# Patient Record
Sex: Female | Born: 1970
Health system: Southern US, Community
[De-identification: ages and names within clinical notes are randomized; demographics above are authoritative.]

## PROBLEM LIST (undated history)

## (undated) DIAGNOSIS — M545 Low back pain, unspecified: Secondary | ICD-10-CM

## (undated) DIAGNOSIS — E7800 Pure hypercholesterolemia, unspecified: Secondary | ICD-10-CM

## (undated) DIAGNOSIS — E669 Obesity, unspecified: Secondary | ICD-10-CM

## (undated) DIAGNOSIS — I509 Heart failure, unspecified: Secondary | ICD-10-CM

## (undated) DIAGNOSIS — O009 Unspecified ectopic pregnancy without intrauterine pregnancy: Secondary | ICD-10-CM

## (undated) DIAGNOSIS — E119 Type 2 diabetes mellitus without complications: Secondary | ICD-10-CM

## (undated) DIAGNOSIS — E079 Disorder of thyroid, unspecified: Secondary | ICD-10-CM

## (undated) DIAGNOSIS — I10 Essential (primary) hypertension: Secondary | ICD-10-CM

## (undated) HISTORY — PX: APPENDECTOMY: SHX54

## (undated) HISTORY — PX: CHOLECYSTECTOMY: SHX55

---

## 2008-01-06 ENCOUNTER — Emergency Department (HOSPITAL_BASED_OUTPATIENT_CLINIC_OR_DEPARTMENT_OTHER): Admission: EM | Admit: 2008-01-06 | Discharge: 2008-01-06 | Payer: Self-pay | Admitting: Emergency Medicine

## 2011-09-05 ENCOUNTER — Emergency Department (HOSPITAL_BASED_OUTPATIENT_CLINIC_OR_DEPARTMENT_OTHER)
Admission: EM | Admit: 2011-09-05 | Discharge: 2011-09-05 | Disposition: A | Discharge status: Home / Self Care | Payer: Self-pay | Attending: Emergency Medicine | Admitting: Emergency Medicine

## 2011-09-05 ENCOUNTER — Emergency Department (INDEPENDENT_AMBULATORY_CARE_PROVIDER_SITE_OTHER): Payer: Self-pay

## 2011-09-05 ENCOUNTER — Encounter (HOSPITAL_BASED_OUTPATIENT_CLINIC_OR_DEPARTMENT_OTHER): Payer: Self-pay | Admitting: Emergency Medicine

## 2011-09-05 DIAGNOSIS — I517 Cardiomegaly: Secondary | ICD-10-CM

## 2011-09-05 DIAGNOSIS — Z760 Encounter for issue of repeat prescription: Secondary | ICD-10-CM

## 2011-09-05 DIAGNOSIS — I509 Heart failure, unspecified: Secondary | ICD-10-CM | POA: Insufficient documentation

## 2011-09-05 DIAGNOSIS — R5381 Other malaise: Secondary | ICD-10-CM | POA: Insufficient documentation

## 2011-09-05 DIAGNOSIS — R0789 Other chest pain: Secondary | ICD-10-CM | POA: Insufficient documentation

## 2011-09-05 DIAGNOSIS — Z79899 Other long term (current) drug therapy: Secondary | ICD-10-CM | POA: Insufficient documentation

## 2011-09-05 DIAGNOSIS — R079 Chest pain, unspecified: Secondary | ICD-10-CM

## 2011-09-05 DIAGNOSIS — I10 Essential (primary) hypertension: Secondary | ICD-10-CM | POA: Insufficient documentation

## 2011-09-05 HISTORY — DX: Essential (primary) hypertension: I10

## 2011-09-05 HISTORY — DX: Heart failure, unspecified: I50.9

## 2011-09-05 LAB — CARDIAC PANEL(CRET KIN+CKTOT+MB+TROPI): Total CK: 298 U/L — ABNORMAL HIGH (ref 7–177)

## 2011-09-05 LAB — DIFFERENTIAL
Basophils Absolute: 0 10*3/uL (ref 0.0–0.1)
Eosinophils Relative: 3 % (ref 0–5)
Lymphocytes Relative: 48 % — ABNORMAL HIGH (ref 12–46)
Lymphs Abs: 3.5 10*3/uL (ref 0.7–4.0)
Monocytes Absolute: 0.8 10*3/uL (ref 0.1–1.0)

## 2011-09-05 LAB — CBC
HCT: 35.8 % — ABNORMAL LOW (ref 36.0–46.0)
MCV: 83.8 fL (ref 78.0–100.0)
RDW: 13.6 % (ref 11.5–15.5)
WBC: 7.3 10*3/uL (ref 4.0–10.5)

## 2011-09-05 LAB — URINE MICROSCOPIC-ADD ON

## 2011-09-05 LAB — COMPREHENSIVE METABOLIC PANEL
CO2: 26 mEq/L (ref 19–32)
Calcium: 8.9 mg/dL (ref 8.4–10.5)
Creatinine, Ser: 1 mg/dL (ref 0.50–1.10)
GFR calc Af Amer: 79 mL/min — ABNORMAL LOW (ref >90)
GFR calc non Af Amer: 68 mL/min — ABNORMAL LOW (ref >90)
Glucose, Bld: 151 mg/dL — ABNORMAL HIGH (ref 70–99)

## 2011-09-05 LAB — URINALYSIS, ROUTINE W REFLEX MICROSCOPIC
Nitrite: NEGATIVE
Specific Gravity, Urine: 1.021 (ref 1.005–1.030)
Urobilinogen, UA: 1 mg/dL (ref 0.0–1.0)

## 2011-09-05 LAB — PREGNANCY, URINE: Preg Test, Ur: NEGATIVE

## 2011-09-05 MED ORDER — CARVEDILOL 12.5 MG PO TABS: 12.5000 mg | ORAL_TABLET | Freq: Two times a day (BID) | ORAL | Status: DC

## 2011-09-05 MED ORDER — FUROSEMIDE 20 MG PO TABS
ORAL_TABLET | ORAL | Status: AC
  Administered 2011-09-05: 20 mg via ORAL
  Filled 2011-09-05: qty 1

## 2011-09-05 MED ORDER — AMLODIPINE BESYLATE 10 MG PO TABS: 10.0000 mg | ORAL_TABLET | Freq: Every day | ORAL | Status: DC

## 2011-09-05 MED ORDER — FUROSEMIDE 20 MG PO TABS
20.0000 mg | ORAL_TABLET | Freq: Once | ORAL | Status: AC
  Administered 2011-09-05: 20 mg via ORAL

## 2011-09-05 MED ORDER — LISINOPRIL 10 MG PO TABS
ORAL_TABLET | ORAL | Status: AC
  Administered 2011-09-05: 10 mg via ORAL
  Filled 2011-09-05: qty 1

## 2011-09-05 MED ORDER — ASPIRIN 81 MG PO CHEW
CHEWABLE_TABLET | ORAL | Status: AC
  Administered 2011-09-05: 81 mg via ORAL
  Filled 2011-09-05: qty 1

## 2011-09-05 MED ORDER — LISINOPRIL 10 MG PO TABS
10.0000 mg | ORAL_TABLET | Freq: Once | ORAL | Status: AC
  Administered 2011-09-05: 10 mg via ORAL

## 2011-09-05 MED ORDER — FUROSEMIDE 20 MG PO TABS: 20.0000 mg | ORAL_TABLET | Freq: Once | ORAL | Status: DC

## 2011-09-05 MED ORDER — LISINOPRIL 10 MG PO TABS: 10.0000 mg | ORAL_TABLET | Freq: Every day | ORAL | Status: DC

## 2011-09-05 MED ORDER — AMLODIPINE BESYLATE 5 MG PO TABS
10.0000 mg | ORAL_TABLET | Freq: Once | ORAL | Status: AC
  Administered 2011-09-05: 10 mg via ORAL

## 2011-09-05 MED ORDER — AMLODIPINE BESYLATE 5 MG PO TABS
ORAL_TABLET | ORAL | Status: AC
  Administered 2011-09-05: 10 mg via ORAL
  Filled 2011-09-05: qty 2

## 2011-09-05 MED ORDER — ASPIRIN 81 MG PO CHEW
81.0000 mg | CHEWABLE_TABLET | Freq: Once | ORAL | Status: AC
  Administered 2011-09-05: 81 mg via ORAL

## 2011-09-05 NOTE — ED Notes (Signed)
Attempted IV access x 2 unsuccessful.  Crystal, RRT at bedside.

## 2011-09-05 NOTE — ED Notes (Signed)
Pt not taking medications as prescribed-

## 2011-09-05 NOTE — ED Notes (Signed)
Pt c/o of "feeling fatigued" with midsternal chest pain that began yesterday.denies SOB

## 2011-09-05 NOTE — Discharge Instructions (Signed)
Chest Pain (Nonspecific) Chest pain has many causes. Your pain could be caused by something serious, such as a heart attack or a blood clot in the lungs. It could also be caused by something less serious, such as a chest bruise or a virus. Follow up with your doctor. More lab tests or other studies may be needed to find the cause of your pain. Most of the time, nonspecific chest pain will improve within 2 to 3 days of rest and mild pain medicine. HOME CARE  For chest bruises, you may put ice on the sore area for 15 to 20 minutes, 3 to 4 times a day. Do this only if it makes you or your child feel better.   Put ice in a plastic bag.   Place a towel between the skin and the bag.   Rest for the next 2 to 3 days.   Go back to work if the pain improves.   See your doctor if the pain lasts longer than 1 to 2 weeks.   Only take medicine as told by your doctor.   Quit smoking if you smoke.  GET HELP RIGHT AWAY IF:   There is more pain or pain that spreads to the arm, neck, jaw, back, or belly (abdomen).   You or your child has shortness of breath.   You or your child coughs more than usual or coughs up blood.   You or your child has very bad back or belly pain, feels sick to his or her stomach (nauseous), or throws up (vomits).   You or your child has very bad weakness.   You or your child passes out (faints).   You or your child has a temperature by mouth above 102 F (38.9 C), not controlled by medicine.  Any of these problems may be serious and may be an emergency. Do not wait to see if the problems will go away. Get medical help right away. Call your local emergency services 911 in [place] Do not drive yourself to the hospital. MAKE SURE YOU:   Understand these instructions.   Will watch this condition.   Will get help right away if you or your child is not doing well or gets worse.  Document Released: 10/25/2007 Document Revised: 04/27/2011 Document Reviewed:  10/25/2007 [employer] Patient Information 2012 [employer]. 

## 2011-09-05 NOTE — ED Provider Notes (Signed)
History     CSN: [id number]  Arrival date & time 09/05/11  0911   First MD Initiated Contact with Patient 09/05/11 [phone]      Chief Complaint  Patient presents with  . Chest Pain    (Consider location/radiation/quality/duration/timing/severity/associated sxs/prior treatment) HPI Pt ran out of her normal medications over the weekend. She noticed strange feeling in her throat she thought was indigestion on Sunday. She has had no SOB, cough, CP, or LE edema/pain. States she lost her insurance and has yet to find another MD.  Past Medical History  Diagnosis Date  . CHF (congestive heart failure)   . Hypertension     History reviewed. No pertinent past surgical history.  No family history on file.  History  Substance Use Topics  . Smoking status: Never Smoker   . Smokeless tobacco: Not on file  . Alcohol Use: No    OB History    Grav Para Term Preterm Abortions TAB SAB Ect Mult Living                  Review of Systems  Constitutional: Positive for fatigue. Negative for fever and chills.  HENT: Negative for sore throat, trouble swallowing and neck pain.   Respiratory: Negative for chest tightness and shortness of breath.   Cardiovascular: Negative for chest pain.  Gastrointestinal: Negative for nausea, vomiting and abdominal pain.  Musculoskeletal: Negative for back pain.  Skin: Negative for rash and wound.  Neurological: Negative for weakness, light-headedness, numbness and headaches.    Allergies  Review of patient's allergies indicates no known allergies.  Home Medications   Current Outpatient Rx  Name Route Sig Dispense Refill  . AMLODIPINE BESYLATE 10 MG PO TABS Oral Take 10 mg by mouth 2 (two) times daily.    Marland Kitchen LISINOPRIL 10 MG PO TABS Oral Take 10 mg by mouth daily.    Marland Kitchen AMLODIPINE BESYLATE 10 MG PO TABS Oral Take 1 tablet (10 mg total) by mouth daily. 30 tablet 0  . ASPIRIN 81 MG PO TABS Oral Take 81 mg by mouth daily.    Marland Kitchen CARVEDILOL 12.5 MG PO TABS  Oral Take 12.5 mg by mouth 2 (two) times daily with a meal.    . CARVEDILOL 12.5 MG PO TABS Oral Take 1 tablet (12.5 mg total) by mouth 2 (two) times daily with a meal. 60 tablet 0  . FUROSEMIDE 20 MG PO TABS Oral Take 20 mg by mouth 2 (two) times daily.    . FUROSEMIDE 20 MG PO TABS Oral Take 1 tablet (20 mg total) by mouth once. 30 tablet 0  . LISINOPRIL 10 MG PO TABS Oral Take 1 tablet (10 mg total) by mouth daily. 30 tablet 0    BP 182/99  Pulse 93  Temp(Src) 98.6 F (37 C) (Oral)  Resp 18  Ht 5\' 1"  (1.549 m)  Wt 190 lb (86.183 kg)  BMI 35.90 kg/m2  SpO2 97%  LMP 08/09/2011  Physical Exam  Nursing note and vitals reviewed. Constitutional: She is oriented to person, place, and time. She appears well-developed and well-nourished. No distress.  HENT:  Head: Normocephalic and atraumatic.  Mouth/Throat: Oropharynx is clear and moist.  Eyes: EOM are normal. Pupils are equal, round, and reactive to light.  Neck: Normal range of motion. Neck supple.  Cardiovascular: Normal rate and regular rhythm.   Pulmonary/Chest: Effort normal and breath sounds normal. No respiratory distress. She has no wheezes. She has no rales. She exhibits no tenderness.  Abdominal: Soft. Bowel sounds are normal. There is no tenderness. There is no rebound and no guarding.  Musculoskeletal: Normal range of motion. She exhibits no edema and no tenderness.       No lower ext swelling or pain  Neurological: She is alert and oriented to person, place, and time.  Skin: Skin is warm and dry. No rash noted. No erythema.  Psychiatric: She has a normal mood and affect. Her behavior is normal.    ED Course  Procedures (including critical care time)  Labs Reviewed  CBC - Abnormal; Notable for the following:    HCT 35.8 (*)    All other components within normal limits  DIFFERENTIAL - Abnormal; Notable for the following:    Neutrophils Relative 38 (*)    Lymphocytes Relative 48 (*)    All other components within  normal limits  COMPREHENSIVE METABOLIC PANEL - Abnormal; Notable for the following:    Potassium 3.3 (*)    Glucose, Bld 151 (*)    GFR calc non Af Amer 68 (*)    GFR calc Af Amer 79 (*)    All other components within normal limits  URINALYSIS, ROUTINE W REFLEX MICROSCOPIC - Abnormal; Notable for the following:    APPearance CLOUDY (*)    Protein, ur >300 (*)    Leukocytes, UA SMALL (*)    All other components within normal limits  CARDIAC PANEL(CRET KIN+CKTOT+MB+TROPI) - Abnormal; Notable for the following:    Total CK 298 (*)    All other components within normal limits  PRO B NATRIURETIC PEPTIDE - Abnormal; Notable for the following:    Pro B Natriuretic peptide (BNP) 1033.0 (*)    All other components within normal limits  URINE MICROSCOPIC-ADD ON - Abnormal; Notable for the following:    Squamous Epithelial / LPF MANY (*)    Bacteria, UA MANY (*)    All other components within normal limits  PREGNANCY, URINE  TROPONIN I   Dg Chest 2 View  09/05/2011  *RADIOLOGY REPORT*  Clinical Data: Chest pain.  CHEST - 2 VIEW  Comparison: None  Findings: Mild cardiomegaly.  Lungs are clear.  No effusions.  No acute bony abnormality.  IMPRESSION: Cardiomegaly.  No active disease.  Original Report Authenticated By: Raelyn Number, M.D.     1. Atypical chest pain   2. Medication refill     Date: 09/05/2011  Rate: 99  Rhythm: normal sinus rhythm  QRS Axis: right  Intervals: QT prolonged  ST/T Wave abnormalities: nonspecific T wave changes  Conduction Disutrbances:none  Narrative Interpretation:   Old EKG Reviewed: unchanged     MDM  Pt resting comfortably. EKG abnormal but unchanged from 12/2007. Neg trop x 2. Unlikely her symptoms represent CAD. Encouraged to find new PMD and take meds as prescribed. Return for concerns.         Julianne Rice, MD 09/05/11 [phone]

## 2011-09-05 NOTE — ED Notes (Signed)
Pt medicated and informed of plan of care.  Troponin due at 1300

## 2011-09-05 NOTE — ED Notes (Signed)
MD at bedside. 

## 2013-06-03 ENCOUNTER — Encounter (HOSPITAL_BASED_OUTPATIENT_CLINIC_OR_DEPARTMENT_OTHER): Payer: Self-pay | Admitting: Emergency Medicine

## 2013-06-03 ENCOUNTER — Emergency Department (HOSPITAL_BASED_OUTPATIENT_CLINIC_OR_DEPARTMENT_OTHER)
Admission: EM | Admit: 2013-06-03 | Discharge: 2013-06-03 | Disposition: A | Discharge status: Home / Self Care | Payer: Medicaid Other | Attending: Emergency Medicine | Admitting: Emergency Medicine

## 2013-06-03 DIAGNOSIS — Z79899 Other long term (current) drug therapy: Secondary | ICD-10-CM | POA: Insufficient documentation

## 2013-06-03 DIAGNOSIS — B353 Tinea pedis: Secondary | ICD-10-CM

## 2013-06-03 DIAGNOSIS — I509 Heart failure, unspecified: Secondary | ICD-10-CM | POA: Insufficient documentation

## 2013-06-03 DIAGNOSIS — Z7982 Long term (current) use of aspirin: Secondary | ICD-10-CM | POA: Insufficient documentation

## 2013-06-03 DIAGNOSIS — I10 Essential (primary) hypertension: Secondary | ICD-10-CM | POA: Insufficient documentation

## 2013-06-03 MED ORDER — MICONAZOLE NITRATE 2 % EX CREA: 1.0000 "application " | TOPICAL_CREAM | Freq: Two times a day (BID) | CUTANEOUS | Status: DC

## 2013-06-03 NOTE — Discharge Instructions (Signed)
If cream does not help with hands within 1-2 weeks then start hydrocortisone cream

## 2013-06-03 NOTE — ED Notes (Signed)
Pt amb to room 7 with quick steady gait in nad. Pt reports rash to hands and feet x several days. Denies any pain or itching.

## 2013-06-03 NOTE — ED Provider Notes (Addendum)
CSN: [id number]     Arrival date & time 06/03/13  0907 History   First MD Initiated Contact with Patient 06/03/13 [phone]     Chief Complaint  Patient presents with  . Rash   (Consider location/radiation/quality/duration/timing/severity/associated sxs/prior Treatment) Patient is a 43 y.o. female presenting with rash. The history is provided by the patient.  Rash Location:  Finger and toe Finger rash location: between the fingers and toes bilaterally. 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 Ineffective treatments:  None tried Associated symptoms: no induration and no joint pain     Past Medical History  Diagnosis Date  . CHF (congestive heart failure)   . Hypertension    History reviewed. No pertinent past surgical history. History reviewed. No pertinent family history. History  Substance Use Topics  . Smoking status: Never Smoker   . Smokeless tobacco: Not on file  . Alcohol Use: No   OB History   Grav Para Term Preterm Abortions TAB SAB Ect Mult Living                 Review of Systems  Musculoskeletal: Negative for arthralgias.  Skin: Positive for rash.  All other systems reviewed and are negative.    Allergies  Review of patient's allergies indicates no known allergies.  Home Medications   Current Outpatient Rx  Name  Route  Sig  Dispense  Refill  . levothyroxine (SYNTHROID, LEVOTHROID) 50 MCG tablet   Oral   Take 50 mcg by mouth daily before breakfast.         . metFORMIN (GLUCOPHAGE) 500 MG tablet   Oral   Take by mouth 2 (two) times daily with a meal.         . pravastatin (PRAVACHOL) 10 MG tablet   Oral   Take 10 mg by mouth daily.         Marland Kitchen amLODipine  (NORVASC) 10 MG tablet   Oral   Take 10 mg by mouth 2 (two) times daily.         Marland Kitchen EXPIRED: amLODipine (NORVASC) 10 MG tablet   Oral   Take 1 tablet (10 mg total) by mouth daily.   30 tablet   0   . aspirin 81 MG tablet   Oral   Take 81 mg by mouth daily.         . carvedilol (COREG) 12.5 MG tablet   Oral   Take 12.5 mg by mouth 2 (two) times daily with a meal.         . EXPIRED: carvedilol (COREG) 12.5 MG tablet   Oral   Take 1 tablet (12.5 mg total) by mouth 2 (two) times daily with a meal.   60 tablet   0   . furosemide (LASIX) 20 MG tablet   Oral   Take 20 mg by mouth 2 (two) times daily.         Marland Kitchen EXPIRED: furosemide (LASIX) 20 MG tablet   Oral   Take 1 tablet (20 mg total) by mouth once.   30 tablet   0   . lisinopril (PRINIVIL,ZESTRIL) 10 MG tablet   Oral   Take 10 mg by mouth daily.         Marland Kitchen  EXPIRED: lisinopril (PRINIVIL,ZESTRIL) 10 MG tablet   Oral   Take 1 tablet (10 mg total) by mouth daily.   30 tablet   0    BP 156/83  Pulse 74  Temp(Src) 97.9 F (36.6 C) (Oral)  Resp 18  SpO2 98% Physical Exam  Nursing note and vitals reviewed. Constitutional: She is oriented to person, place, and time. She appears well-developed and well-nourished. No distress.  HENT:  Head: Normocephalic and atraumatic.  Eyes: EOM are normal. Pupils are equal, round, and reactive to light.  Cardiovascular: Normal rate.   Pulmonary/Chest: Effort normal.  Neurological: She is alert and oriented to person, place, and time.  Skin: Skin is warm and dry. Rash noted.  Dry, scaly, peeling rash between the toes and fingers.  Minimal erythema, no induration or fluctuance  Psychiatric: She has a normal mood and affect. Her behavior is normal. Thought content normal.    ED Course  Procedures (including critical care time) Labs Review Labs Reviewed - No data to display Imaging Review No results found.  EKG Interpretation   None       MDM   1. Tinea pedis of  both feet     Patient with rash on hands and feet most consistent with tinea we'll give miconazole.   Blanchie Dessert, MD 06/03/13 [place], MD 06/03/13 [phone]

## 2013-10-16 ENCOUNTER — Emergency Department (HOSPITAL_BASED_OUTPATIENT_CLINIC_OR_DEPARTMENT_OTHER)
Admission: EM | Admit: 2013-10-16 | Discharge: 2013-10-16 | Disposition: A | Discharge status: Other Acute Inpatient Hospital | Payer: Medicaid Other | Attending: Emergency Medicine | Admitting: Emergency Medicine

## 2013-10-16 ENCOUNTER — Encounter (HOSPITAL_BASED_OUTPATIENT_CLINIC_OR_DEPARTMENT_OTHER): Payer: Self-pay | Admitting: Emergency Medicine

## 2013-10-16 ENCOUNTER — Emergency Department (HOSPITAL_BASED_OUTPATIENT_CLINIC_OR_DEPARTMENT_OTHER): Payer: Medicaid Other

## 2013-10-16 DIAGNOSIS — E7800 Pure hypercholesterolemia, unspecified: Secondary | ICD-10-CM | POA: Insufficient documentation

## 2013-10-16 DIAGNOSIS — E669 Obesity, unspecified: Secondary | ICD-10-CM | POA: Insufficient documentation

## 2013-10-16 DIAGNOSIS — I509 Heart failure, unspecified: Secondary | ICD-10-CM | POA: Diagnosis not present

## 2013-10-16 DIAGNOSIS — I10 Essential (primary) hypertension: Secondary | ICD-10-CM | POA: Diagnosis not present

## 2013-10-16 DIAGNOSIS — Z79899 Other long term (current) drug therapy: Secondary | ICD-10-CM | POA: Insufficient documentation

## 2013-10-16 DIAGNOSIS — R0602 Shortness of breath: Secondary | ICD-10-CM | POA: Insufficient documentation

## 2013-10-16 DIAGNOSIS — E119 Type 2 diabetes mellitus without complications: Secondary | ICD-10-CM | POA: Diagnosis not present

## 2013-10-16 DIAGNOSIS — Z7982 Long term (current) use of aspirin: Secondary | ICD-10-CM | POA: Insufficient documentation

## 2013-10-16 DIAGNOSIS — R079 Chest pain, unspecified: Secondary | ICD-10-CM | POA: Diagnosis present

## 2013-10-16 HISTORY — DX: Unspecified ectopic pregnancy without intrauterine pregnancy: O00.90

## 2013-10-16 HISTORY — DX: Pure hypercholesterolemia, unspecified: E78.00

## 2013-10-16 HISTORY — DX: Type 2 diabetes mellitus without complications: E11.9

## 2013-10-16 HISTORY — DX: Obesity, unspecified: E66.9

## 2013-10-16 LAB — BASIC METABOLIC PANEL
BUN: 12 mg/dL (ref 6–23)
CALCIUM: 9.3 mg/dL (ref 8.4–10.5)
CO2: 23 mEq/L (ref 19–32)
CREATININE: 0.9 mg/dL (ref 0.50–1.10)
Chloride: 104 mEq/L (ref 96–112)
GFR calc non Af Amer: 76 mL/min — ABNORMAL LOW (ref >90)
GFR, EST AFRICAN AMERICAN: 88 mL/min — AB (ref >90)
Glucose, Bld: 125 mg/dL — ABNORMAL HIGH (ref 70–99)
Potassium: 3.3 mEq/L — ABNORMAL LOW (ref 3.7–5.3)
Sodium: 141 mEq/L (ref 137–147)

## 2013-10-16 LAB — PRO B NATRIURETIC PEPTIDE: Pro B Natriuretic peptide (BNP): 2786 pg/mL — ABNORMAL HIGH (ref 0–125)

## 2013-10-16 LAB — TROPONIN I: Troponin I: <0.3 ng/mL (ref <0.30)

## 2013-10-16 LAB — CBC WITH DIFFERENTIAL/PLATELET
BASOS PCT: 1 % (ref 0–1)
Basophils Absolute: 0 10*3/uL (ref 0.0–0.1)
EOS ABS: 0.2 10*3/uL (ref 0.0–0.7)
Eosinophils Relative: 2 % (ref 0–5)
HEMATOCRIT: 38.3 % (ref 36.0–46.0)
Hemoglobin: 13.1 g/dL (ref 12.0–15.0)
Lymphocytes Relative: 50 % — ABNORMAL HIGH (ref 12–46)
Lymphs Abs: 4.2 10*3/uL — ABNORMAL HIGH (ref 0.7–4.0)
MCH: 30.7 pg (ref 26.0–34.0)
MCHC: 34.2 g/dL (ref 30.0–36.0)
MCV: 89.7 fL (ref 78.0–100.0)
Monocytes Absolute: 0.8 10*3/uL (ref 0.1–1.0)
Monocytes Relative: 10 % (ref 3–12)
NEUTROS PCT: 38 % — AB (ref 43–77)
Neutro Abs: 3.2 10*3/uL (ref 1.7–7.7)
Platelets: 298 10*3/uL (ref 150–400)
RBC: 4.27 MIL/uL (ref 3.87–5.11)
RDW: 12.9 % (ref 11.5–15.5)
WBC: 8.4 10*3/uL (ref 4.0–10.5)

## 2013-10-16 LAB — D-DIMER, QUANTITATIVE: D-Dimer, Quant: <0.27 ug/mL-FEU (ref 0.00–0.48)

## 2013-10-16 MED ORDER — NITROGLYCERIN 0.4 MG SL SUBL
0.4000 mg | SUBLINGUAL_TABLET | SUBLINGUAL | Status: DC | PRN
  Administered 2013-10-16: 0.4 mg via SUBLINGUAL
  Filled 2013-10-16: qty 1

## 2013-10-16 MED ORDER — AZITHROMYCIN 250 MG PO TABS
500.0000 mg | ORAL_TABLET | Freq: Once | ORAL | Status: AC
  Administered 2013-10-16: 500 mg via ORAL
  Filled 2013-10-16: qty 2

## 2013-10-16 NOTE — ED Notes (Signed)
Call [facility] for hospitalist

## 2013-10-16 NOTE — ED Notes (Signed)
Upper left non-radiating cp since yesterday.  Pt sts her doctor "cancelled all my meds". Has been without meds x 1 1/2 months.

## 2013-10-16 NOTE — ED Provider Notes (Signed)
CSN: [id number]     Arrival date & time 10/16/13  1733 History  This chart was scribed for Neta Ehlers, MD by Roxan Diesel, ED scribe.  This patient was seen in room MH12/MH12 and the patient's care was started at 6:27 PM.   Chief Complaint  Patient presents with  . Chest Pain    Patient is a 43 y.o. female presenting with chest pain. The history is provided by the patient. No language interpreter was used.  Chest Pain Pain location:  L chest Pain radiates to:  L arm (on one occasion) Pain severity now: Mild-to-moderate. Duration:  3 days Timing:  Intermittent (but constant for the past 6 hours) Chronicity:  New Context comment:  Off of medications Relieved by:  Nothing Worsened by:  Nothing tried Associated symptoms: palpitations and shortness of breath   Associated symptoms: no abdominal pain, no back pain, no cough, no diaphoresis, no fatigue, no fever, no headache, no nausea, no numbness, not vomiting and no weakness   Risk factors: diabetes mellitus, high cholesterol and hypertension   Risk factors comment:  CHF   HPI Comments: Linda Phelps is a 43 y.o. female with h/o CHF, DM, HTN, and hypercholesterolemia who presents to the Emergency Department complaining of left-sided chest pain that began 3 days ago.  Pt states that for the past month she has been off of her lisinopril, Norvasc, Coreg, Lasix, metformin and pravastatin.  She was unable to refill her prescriptions because has not been to her PCP since February.  Since then she has only been taking aspirin 1x/day.  For the past 3 days she has experienced left-sided chest pain which was occurring off-and-on but for the past 6 hours has been constant   She describes CP as "noticable" and localizes pain to the left side of her chest.  On one instance it radiated down her left arm.  She rates pain at 3/10 currently, and 4/10 at its worst.  It is not worsened or relieved by anything.  It is not worsened by deep breathing.  She  also reports associated intermittent SOB and blurred vision.  She denies SOB currently but states she did become short of breath while cleaning at work earlier today.  She also has felt some palpitations which she describes as a sensation of missed beats.  She denies nausea, vomiting, diaphoresis, fevers, cough, urinary or bowel symptoms, change in appetite, or unexplained weight loss.  She does note some possible fluctuating worsening leg swelling since she has been off of her Lasix.  She denies recent long travel, immobilization, surgery, or hormone therapy.  She does not smoke.  She denies h/o MI, heart catheterization, or stress test.  She took a 325mg  aspirin this morning.    Cardiologist was at [facility].  She has not been seen there in over year.  She is now seen at the [facility] on [address].   Past Medical History  Diagnosis Date  . CHF (congestive heart failure)   . Hypertension   . Obesity   . Diabetes mellitus without complication   . High cholesterol   . Ectopic pregnancy     Past Surgical History  Procedure Laterality Date  . Cholecystectomy    . Appendectomy      No family history on file.   History  Substance Use Topics  . Smoking status: Never Smoker   . Smokeless tobacco: Not on file  . Alcohol Use: Yes     Comment:  occ    OB History   Grav Para Term Preterm Abortions TAB SAB Ect Mult Living                   Review of Systems  Constitutional: Negative for fever, chills, diaphoresis, activity change, appetite change, fatigue and unexpected weight change.  HENT: Negative for congestion, facial swelling, rhinorrhea and sore throat.   Eyes: Negative for photophobia and discharge.  Respiratory: Positive for shortness of breath. Negative for cough and chest tightness.   Cardiovascular: Positive for chest pain, palpitations and leg swelling (fluctuating).  Gastrointestinal: Negative for nausea, vomiting, abdominal pain and  diarrhea.  Endocrine: Negative for polydipsia and polyuria.  Genitourinary: Negative for dysuria, frequency, difficulty urinating and pelvic pain.  Musculoskeletal: Negative for arthralgias, back pain, neck pain and neck stiffness.  Skin: Negative for color change and wound.  Allergic/Immunologic: Negative for immunocompromised state.  Neurological: Negative for facial asymmetry, weakness, numbness and headaches.  Hematological: Does not bruise/bleed easily.  Psychiatric/Behavioral: Negative for confusion and agitation.      Allergies  Review of patient's allergies indicates no known allergies.  Home Medications   Prior to Admission medications   Medication Sig Start Date End Date Taking? Authorizing Provider  amLODipine (NORVASC) 10 MG tablet Take 10 mg by mouth 2 (two) times daily.    Historical Provider, MD  amLODipine (NORVASC) 10 MG tablet Take 1 tablet (10 mg total) by mouth daily. 09/05/11 09/04/12  Julianne Rice, MD  aspirin 81 MG tablet Take 81 mg by mouth daily.    Historical Provider, MD  carvedilol (COREG) 12.5 MG tablet Take 12.5 mg by mouth 2 (two) times daily with a meal.    Historical Provider, MD  carvedilol (COREG) 12.5 MG tablet Take 1 tablet (12.5 mg total) by mouth 2 (two) times daily with a meal. 09/05/11 09/04/12  Julianne Rice, MD  furosemide (LASIX) 20 MG tablet Take 20 mg by mouth 2 (two) times daily.    Historical Provider, MD  furosemide (LASIX) 20 MG tablet Take 1 tablet (20 mg total) by mouth once. 09/05/11 09/04/12  Julianne Rice, MD  levothyroxine (SYNTHROID, LEVOTHROID) 50 MCG tablet Take 50 mcg by mouth daily before breakfast.    Historical Provider, MD  lisinopril (PRINIVIL,ZESTRIL) 10 MG tablet Take 10 mg by mouth daily.    Historical Provider, MD  lisinopril (PRINIVIL,ZESTRIL) 10 MG tablet Take 1 tablet (10 mg total) by mouth daily. 09/05/11 09/04/12  Julianne Rice, MD  metFORMIN (GLUCOPHAGE) 500 MG tablet Take by mouth 2 (two) times daily with a  meal.    Historical Provider, MD  miconazole (MICOTIN) 2 % cream Apply 1 application topically 2 (two) times daily. To hands and feet 06/03/13   Blanchie Dessert, MD  pravastatin (PRAVACHOL) 10 MG tablet Take 10 mg by mouth daily.    Historical Provider, MD   BP 200/116  Pulse 102  Temp(Src) 99.1 F (37.3 C) (Oral)  Resp 16  Ht 5' (1.524 m)  Wt 190 lb (86.183 kg)  BMI 37.11 kg/m2  SpO2 97%  LMP 10/08/2013  Physical Exam  Nursing note and vitals reviewed. Constitutional: She is oriented to person, place, and time. She appears well-developed and well-nourished. No distress.  HENT:  Head: Normocephalic.  Mouth/Throat: Oropharynx is clear and moist.  Eyes: Pupils are equal, round, and reactive to light.  Neck: Neck supple.  Cardiovascular: Normal rate, regular rhythm and normal heart sounds.   Pulmonary/Chest: Effort normal. No respiratory distress. She has no wheezes.  Mild crackles at bilateral bases  Abdominal: Soft. She exhibits no distension. There is no tenderness. There is no rebound and no guarding.  Musculoskeletal: She exhibits no edema and no tenderness.  Neurological: She is alert and oriented to person, place, and time.  Skin: Skin is warm and dry.  Psychiatric: She has a normal mood and affect.    ED Course  Procedures (including critical care time)  DIAGNOSTIC STUDIES: Oxygen Saturation is 97% on room air, normal by my interpretation.    COORDINATION OF CARE: 6:37 PM-Discussed treatment plan which includes EKG, CXR, and labs with pt at bedside and pt agreed to plan.     Labs Review Labs Reviewed  CBC WITH DIFFERENTIAL - Abnormal; Notable for the following:    Neutrophils Relative % 38 (*)    Lymphocytes Relative 50 (*)    Lymphs Abs 4.2 (*)    All other components within normal limits  BASIC METABOLIC PANEL - Abnormal; Notable for the following:    Potassium 3.3 (*)    Glucose, Bld 125 (*)    GFR calc non Af Amer 76 (*)    GFR calc Af Amer 88 (*)     All other components within normal limits  PRO B NATRIURETIC PEPTIDE - Abnormal; Notable for the following:    Pro B Natriuretic peptide (BNP) 2786.0 (*)    All other components within normal limits  TROPONIN I  D-DIMER, QUANTITATIVE    Imaging Review Dg Chest 2 View  10/16/2013   CLINICAL DATA:  Chest pain.  EXAM: CHEST  2 VIEW  COMPARISON:  Chest x-ray 09/05/2011 .  FINDINGS: Mediastinum and hilar structures are normal. Heart size normal. Pulmonary vascularity is normal. Mild infiltrate right lung base. No pleural effusion or pneumothorax. Surgical clips right upper quadrant. No acute bony abnormality.  IMPRESSION: 1.  Right lower lobe infiltrate consistent with pneumonia.  2.  Cardiomegaly, no pulmonary venous congestion.   Electronically Signed   By: Marcello Moores  Register   On: 10/16/2013 18:57     EKG Interpretation   Date/Time:  Thursday Oct 16 2013 17:40:12 EDT Ventricular Rate:  102 PR Interval:  166 QRS Duration: 108 QT Interval:  336 QTC Calculation: 437 R Axis:   18 Text Interpretation:  Sinus tachycardia Minimal voltage criteria for LVH,  may be normal variant Nonspecific T wave abnormality Abnormal ECG New TWI  I, new TW flattening in aVL Confirmed by DOCHERTY  MD, MEGAN [phone]) on  10/16/2013 6:24:31 PM      MDM   Final diagnoses:  Chest pain  CHF (congestive heart failure)    Pt is a 43 y.o. female with Pmhx as above who presents with 3 days of intermittent L sided sharp CP with radiation to L arm, now constant for about 6 hrs with intermittent assoc SOB. She has been w/o her meds for 1.5 months. On PE, she has low grade temp, tachycardia, HTN. Slight crackles heard at BL bases. Minimal LE edema. EKG w/ new TWI I, new flattening in aVL. CXR w/ RML opacity, but clnical story not c/w pna and nml WBC. BNP elevated c/w lung exam and mild CHF exacerbation. Trop negative. I feel pt needs ACS r/o as well as possible CTA chest to r/o PE. CT is down at this facility. Will transfer  to [facility] for admission to IM. PO azithro given. BP improved in dept w/o intervention.    I personally performed the services described in this documentation, which was scribed in my  presence. The recorded information has been reviewed and is accurate.     Neta Ehlers, MD 10/17/13 [phone]

## 2013-10-24 ENCOUNTER — Ambulatory Visit: Payer: Self-pay

## 2014-03-08 ENCOUNTER — Emergency Department (HOSPITAL_BASED_OUTPATIENT_CLINIC_OR_DEPARTMENT_OTHER): Payer: Medicaid Other

## 2014-03-08 ENCOUNTER — Emergency Department (HOSPITAL_BASED_OUTPATIENT_CLINIC_OR_DEPARTMENT_OTHER)
Admission: EM | Admit: 2014-03-08 | Discharge: 2014-03-08 | Disposition: A | Discharge status: Home / Self Care | Payer: Medicaid Other | Attending: Emergency Medicine | Admitting: Emergency Medicine

## 2014-03-08 ENCOUNTER — Encounter (HOSPITAL_BASED_OUTPATIENT_CLINIC_OR_DEPARTMENT_OTHER): Payer: Self-pay | Admitting: Emergency Medicine

## 2014-03-08 DIAGNOSIS — I509 Heart failure, unspecified: Secondary | ICD-10-CM | POA: Insufficient documentation

## 2014-03-08 DIAGNOSIS — R000 Tachycardia, unspecified: Secondary | ICD-10-CM | POA: Insufficient documentation

## 2014-03-08 DIAGNOSIS — E669 Obesity, unspecified: Secondary | ICD-10-CM | POA: Insufficient documentation

## 2014-03-08 DIAGNOSIS — I10 Essential (primary) hypertension: Secondary | ICD-10-CM | POA: Insufficient documentation

## 2014-03-08 DIAGNOSIS — E119 Type 2 diabetes mellitus without complications: Secondary | ICD-10-CM | POA: Insufficient documentation

## 2014-03-08 DIAGNOSIS — R030 Elevated blood-pressure reading, without diagnosis of hypertension: Secondary | ICD-10-CM

## 2014-03-08 DIAGNOSIS — Z79899 Other long term (current) drug therapy: Secondary | ICD-10-CM | POA: Insufficient documentation

## 2014-03-08 DIAGNOSIS — Z760 Encounter for issue of repeat prescription: Secondary | ICD-10-CM | POA: Insufficient documentation

## 2014-03-08 DIAGNOSIS — F419 Anxiety disorder, unspecified: Secondary | ICD-10-CM | POA: Insufficient documentation

## 2014-03-08 DIAGNOSIS — R51 Headache: Secondary | ICD-10-CM | POA: Insufficient documentation

## 2014-03-08 DIAGNOSIS — Z7982 Long term (current) use of aspirin: Secondary | ICD-10-CM | POA: Insufficient documentation

## 2014-03-08 DIAGNOSIS — E780 Pure hypercholesterolemia: Secondary | ICD-10-CM | POA: Insufficient documentation

## 2014-03-08 DIAGNOSIS — IMO0001 Reserved for inherently not codable concepts without codable children: Secondary | ICD-10-CM

## 2014-03-08 LAB — PRO B NATRIURETIC PEPTIDE: Pro B Natriuretic peptide (BNP): 979.3 pg/mL — ABNORMAL HIGH (ref 0–125)

## 2014-03-08 LAB — BASIC METABOLIC PANEL
Anion gap: 14 (ref 5–15)
BUN: 21 mg/dL (ref 6–23)
CHLORIDE: 105 meq/L (ref 96–112)
CO2: 24 mEq/L (ref 19–32)
CREATININE: 0.9 mg/dL (ref 0.50–1.10)
Calcium: 9.4 mg/dL (ref 8.4–10.5)
GFR calc Af Amer: 88 mL/min — ABNORMAL LOW (ref >90)
GFR, EST NON AFRICAN AMERICAN: 76 mL/min — AB (ref >90)
Glucose, Bld: 94 mg/dL (ref 70–99)
Potassium: 4 mEq/L (ref 3.7–5.3)
Sodium: 143 mEq/L (ref 137–147)

## 2014-03-08 LAB — CBC
HEMATOCRIT: 38.3 % (ref 36.0–46.0)
Hemoglobin: 12.5 g/dL (ref 12.0–15.0)
MCH: 29.6 pg (ref 26.0–34.0)
MCHC: 32.6 g/dL (ref 30.0–36.0)
MCV: 90.5 fL (ref 78.0–100.0)
Platelets: 254 10*3/uL (ref 150–400)
RBC: 4.23 MIL/uL (ref 3.87–5.11)
RDW: 13 % (ref 11.5–15.5)
WBC: 5.6 10*3/uL (ref 4.0–10.5)

## 2014-03-08 LAB — TROPONIN I: Troponin I: <0.3 ng/mL (ref <0.30)

## 2014-03-08 MED ORDER — FUROSEMIDE 20 MG PO TABS
20.0000 mg | ORAL_TABLET | Freq: Once | ORAL | Status: AC
  Administered 2014-03-08: 20 mg via ORAL
  Filled 2014-03-08: qty 1

## 2014-03-08 MED ORDER — LISINOPRIL 20 MG PO TABS: 10.0000 mg | ORAL_TABLET | Freq: Every day | ORAL | Status: DC

## 2014-03-08 MED ORDER — FUROSEMIDE 20 MG PO TABS: 20.0000 mg | ORAL_TABLET | Freq: Two times a day (BID) | ORAL | Status: DC

## 2014-03-08 MED ORDER — PRAVASTATIN SODIUM 10 MG PO TABS: 10.0000 mg | ORAL_TABLET | Freq: Every day | ORAL | Status: DC

## 2014-03-08 MED ORDER — CARVEDILOL 12.5 MG PO TABS: 12.5000 mg | ORAL_TABLET | Freq: Two times a day (BID) | ORAL | Status: DC

## 2014-03-08 MED ORDER — AMLODIPINE BESYLATE 5 MG PO TABS
10.0000 mg | ORAL_TABLET | Freq: Once | ORAL | Status: AC
  Administered 2014-03-08: 10 mg via ORAL
  Filled 2014-03-08: qty 2

## 2014-03-08 MED ORDER — METFORMIN HCL 500 MG PO TABS: 500.0000 mg | ORAL_TABLET | Freq: Two times a day (BID) | ORAL | Status: DC

## 2014-03-08 MED ORDER — CARVEDILOL 12.5 MG PO TABS
12.5000 mg | ORAL_TABLET | Freq: Two times a day (BID) | ORAL | Status: DC
  Filled 2014-03-08: qty 1

## 2014-03-08 MED ORDER — AMLODIPINE BESYLATE 10 MG PO TABS: 10.0000 mg | ORAL_TABLET | Freq: Every day | ORAL | Status: DC

## 2014-03-08 NOTE — ED Provider Notes (Signed)
CSN: [id number]     Arrival date & time 03/08/14  1507 History   First MD Initiated Contact with Patient 03/08/14 1519     Chief Complaint  Patient presents with  . Medication Refill     (Consider location/radiation/quality/duration/timing/severity/associated sxs/prior Treatment) HPI Comments: This is a 43 year old obese female with a past medical history of CHF, hypertension, DM and hyperlipidemia who presents to the emergency department requesting a medication refill. Patient reports she has been out of her daily medications for 2 days, and she called [facility] who would not refill her medications over the phone since it has been a while since she has seen her physician. States her last appointment was a little over 3 months ago. She takes amlodipine, carvedilol, furosemide, levothyroxine, lisinopril, metformin and pravastatin. She reports she can tell her blood pressure is elevated because she has a slight headache. Denies chest pain, shortness of breath, vision change, lightheadedness, dizziness, numbness or weakness. It is noted patient is tachycardic on arrival, when discussing increased heart rate, patient reports she is always anxious when she goes to the emergency department.   The history is provided by the patient.    Past Medical History  Diagnosis Date  . CHF (congestive heart failure)   . Hypertension   . Obesity   . Diabetes mellitus without complication   . High cholesterol   . Ectopic pregnancy    Past Surgical History  Procedure Laterality Date  . Cholecystectomy    . Appendectomy     No family history on file. History  Substance Use Topics  . Smoking status: Never Smoker   . Smokeless tobacco: Not on file  . Alcohol Use: Yes     Comment: occ   OB History   Grav Para Term Preterm Abortions TAB SAB Ect Mult Living                 Review of Systems  Neurological: Positive for headaches.  Psychiatric/Behavioral: The patient is  nervous/anxious.   All other systems reviewed and are negative.     Allergies  Review of patient's allergies indicates no known allergies.  Home Medications   Prior to Admission medications   Medication Sig Start Date End Date Taking? Authorizing Provider  amLODipine (NORVASC) 10 MG tablet Take 10 mg by mouth 2 (two) times daily.    Historical Provider, MD  amLODipine (NORVASC) 10 MG tablet Take 1 tablet (10 mg total) by mouth daily. 09/05/11 09/04/12  Julianne Rice, MD  amLODipine (NORVASC) 10 MG tablet Take 1 tablet (10 mg total) by mouth daily. 03/08/14   Carman Ching, PA-C  aspirin 81 MG tablet Take 81 mg by mouth daily.    Historical Provider, MD  carvedilol (COREG) 12.5 MG tablet Take 12.5 mg by mouth 2 (two) times daily with a meal.    Historical Provider, MD  carvedilol (COREG) 12.5 MG tablet Take 1 tablet (12.5 mg total) by mouth 2 (two) times daily with a meal. 09/05/11 09/04/12  Julianne Rice, MD  carvedilol (COREG) 12.5 MG tablet Take 1 tablet (12.5 mg total) by mouth 2 (two) times daily with a meal. 03/08/14   Loyalty Brashier M Jaziya Obarr, PA-C  furosemide (LASIX) 20 MG tablet Take 10 mg by mouth 2 (two) times daily.     Historical Provider, MD  furosemide (LASIX) 20 MG tablet Take 1 tablet (20 mg total) by mouth once. 09/05/11 09/04/12  Julianne Rice, MD  furosemide (LASIX) 20 MG tablet Take 1  tablet (20 mg total) by mouth 2 (two) times daily. 03/08/14   Etheline Geppert M Tamula Morrical, PA-C  levothyroxine (SYNTHROID, LEVOTHROID) 50 MCG tablet Take 50 mcg by mouth daily before breakfast.    Historical Provider, MD  lisinopril (PRINIVIL,ZESTRIL) 10 MG tablet Take 10 mg by mouth daily.    Historical Provider, MD  lisinopril (PRINIVIL,ZESTRIL) 10 MG tablet Take 1 tablet (10 mg total) by mouth daily. 09/05/11 09/04/12  Julianne Rice, MD  lisinopril (PRINIVIL,ZESTRIL) 20 MG tablet Take 0.5 tablets (10 mg total) by mouth daily. 03/08/14   Carman Ching, PA-C  metFORMIN (GLUCOPHAGE) 500 MG tablet Take by mouth 2 (two)  times daily with a meal.    Historical Provider, MD  metFORMIN (GLUCOPHAGE) 500 MG tablet Take 1 tablet (500 mg total) by mouth 2 (two) times daily with a meal. 03/08/14   Kriss Perleberg M Samhita Kretsch, PA-C  miconazole (MICOTIN) 2 % cream Apply 1 application topically 2 (two) times daily. To hands and feet 06/03/13   Blanchie Dessert, MD  pravastatin (PRAVACHOL) 10 MG tablet Take 10 mg by mouth daily.    Historical Provider, MD  pravastatin (PRAVACHOL) 10 MG tablet Take 1 tablet (10 mg total) by mouth daily. 03/08/14   Kemiyah Tarazon M Patrice Matthew, PA-C   BP 184/104  Pulse 114  Temp(Src) 98 F (36.7 C) (Oral)  Resp 20  Ht 5\' 1"  (1.549 m)  Wt 197 lb (89.359 kg)  BMI 37.24 kg/m2  SpO2 99% Physical Exam  Nursing note and vitals reviewed. Constitutional: She is oriented to person, place, and time. She appears well-developed and well-nourished. No distress.  HENT:  Head: Normocephalic and atraumatic.  Mouth/Throat: Oropharynx is clear and moist.  Eyes: Conjunctivae and EOM are normal. Pupils are equal, round, and reactive to light.  Neck: Normal range of motion. Neck supple. No JVD present.  Cardiovascular: Regular rhythm, normal heart sounds and intact distal pulses.   Tachycardic. No extremity edema.  Pulmonary/Chest: Effort normal and breath sounds normal. No respiratory distress.  Abdominal: Soft. Bowel sounds are normal. There is no tenderness.  Musculoskeletal: Normal range of motion. She exhibits no edema.  Neurological: She is alert and oriented to person, place, and time. She has normal strength. No sensory deficit.  Speech fluent, goal oriented. Moves limbs without ataxia. Equal grip strength bilateral.  Skin: Skin is warm and dry. She is not diaphoretic.  Psychiatric: She has a normal mood and affect. Her behavior is normal.    ED Course  Procedures (including critical care time) Labs Review Labs Reviewed  BASIC METABOLIC PANEL - Abnormal; Notable for the following:    GFR calc non Af Amer 76 (*)     GFR calc Af Amer 88 (*)    All other components within normal limits  PRO B NATRIURETIC PEPTIDE - Abnormal; Notable for the following:    Pro B Natriuretic peptide (BNP) 979.3 (*)    All other components within normal limits  CBC  TROPONIN I    Imaging Review Dg Chest 2 View  03/08/2014   CLINICAL DATA:  Patient since is high blood pressure at work today, ran out of blood pressure medication, initial evaluation  EXAM: CHEST  2 VIEW  COMPARISON:  10/17/2013  FINDINGS: Moderate primarily left-sided cardiac enlargement. Vascular pattern normal. Lungs clear. No effusions.  IMPRESSION: Cardiac enlargement.  No acute findings   Electronically Signed   By: Skipper Cliche M.D.   On: 03/08/2014 15:55     EKG Interpretation None  MDM   Final diagnoses:  Elevated blood pressure  Medication refill   Patient presenting for medication refill. It is noted on arrival she is hypertensive and tachycardic. She has been off of her medications for the past 2 days. Mild headache, no chest pain or shortness of breath. After discussion with patient given her increased heart rate and high blood pressure, will obtain a cardiac workup and give her daily blood pressure medications that she did not receive today.  Labs without any acute finding. BNP chronically elevated. CXR showing cardiac enlargement, otherwise no acute findings. EKG without any acute changes. Pt given amlodipine and furosemide in doses she takes at home, BP 183/94 HR remains 109. Pt was up for discharge, however repeat vitals were asked to be reported prior to d/c. No vitals reported to me, and patient left the ED. Patient told nursing staff she was anxious and wanted to get home. Plan was to give metoprolol. Pt given return precautions prior to being up for discharge. All medications prescribed. No s/s of end organ damage. Attempt to call pt to advise her to have these medications filled tonight(which was discussed earlier today), however  unable to contact.   Carman Ching, PA-C 03/08/14 1712

## 2014-03-08 NOTE — Discharge Instructions (Signed)
Take all of your medications daily as prescribed. Follow up with your primary care doctor. This is very important for you to follow up.  Hypertension Hypertension, commonly called high blood pressure, is when the force of blood pumping through your arteries is too strong. Your arteries are the blood vessels that carry blood from your heart throughout your body. A blood pressure reading consists of a higher number over a lower number, such as 110/72. The higher number (systolic) is the pressure inside your arteries when your heart pumps. The lower number (diastolic) is the pressure inside your arteries when your heart relaxes. Ideally you want your blood pressure below 120/80. Hypertension forces your heart to work harder to pump blood. Your arteries may become narrow or stiff. Having hypertension puts you at risk for heart disease, stroke, and other problems.  RISK FACTORS Some risk factors for high blood pressure are controllable. Others are not.  Risk factors you cannot control include:   Race. You may be at higher risk if you are African American.  Age. Risk increases with age.  Gender. Men are at higher risk than women before age 35 years. After age 70, women are at higher risk than men. Risk factors you can control include:  Not getting enough exercise or physical activity.  Being overweight.  Getting too much fat, sugar, calories, or salt in your diet.  Drinking too much alcohol. SIGNS AND SYMPTOMS Hypertension does not usually cause signs or symptoms. Extremely high blood pressure (hypertensive crisis) may cause headache, anxiety, shortness of breath, and nosebleed. DIAGNOSIS  To check if you have hypertension, your health care provider will measure your blood pressure while you are seated, with your arm held at the level of your heart. It should be measured at least twice using the same arm. Certain conditions can cause a difference in blood pressure between your right and left  arms. A blood pressure reading that is higher than normal on one occasion does not mean that you need treatment. If one blood pressure reading is high, ask your health care provider about having it checked again. TREATMENT  Treating high blood pressure includes making lifestyle changes and possibly taking medicine. Living a healthy lifestyle can help lower high blood pressure. You may need to change some of your habits. Lifestyle changes may include:  Following the DASH diet. This diet is high in fruits, vegetables, and whole grains. It is low in salt, red meat, and added sugars.  Getting at least 2 hours of brisk physical activity every week.  Losing weight if necessary.  Not smoking.  Limiting alcoholic beverages.  Learning ways to reduce stress. If lifestyle changes are not enough to get your blood pressure under control, your health care provider may prescribe medicine. You may need to take more than one. Work closely with your health care provider to understand the risks and benefits. HOME CARE INSTRUCTIONS  Have your blood pressure rechecked as directed by your health care provider.   Take medicines only as directed by your health care provider. Follow the directions carefully. Blood pressure medicines must be taken as prescribed. The medicine does not work as well when you skip doses. Skipping doses also puts you at risk for problems.   Do not smoke.   Monitor your blood pressure at home as directed by your health care provider. SEEK MEDICAL CARE IF:   You think you are having a reaction to medicines taken.  You have recurrent headaches or feel dizzy.  You  have swelling in your ankles.  You have trouble with your vision. SEEK IMMEDIATE MEDICAL CARE IF:  You develop a severe headache or confusion.  You have unusual weakness, numbness, or feel faint.  You have severe chest or abdominal pain.  You vomit repeatedly.  You have trouble breathing. MAKE SURE YOU:     Understand these instructions.  Will watch your condition.  Will get help right away if you are not doing well or get worse. Document Released: 05/08/2005 Document Revised: 09/22/2013 Document Reviewed: 02/28/2013 [facility] Patient Information 2015 [place], [place]. This information is not intended to replace advice given to you by your health care provider. Make sure you discuss any questions you have with your health care provider.  DASH Eating Plan DASH stands for "Dietary Approaches to Stop Hypertension." The DASH eating plan is a healthy eating plan that has been shown to reduce high blood pressure (hypertension). Additional health benefits may include reducing the risk of type 2 diabetes mellitus, heart disease, and stroke. The DASH eating plan may also help with weight loss. WHAT DO I NEED TO KNOW ABOUT THE DASH EATING PLAN? For the DASH eating plan, you will follow these general guidelines:  Choose foods with a percent daily value for sodium of less than 5% (as listed on the food label).  Use salt-free seasonings or herbs instead of table salt or sea salt.  Check with your health care provider or pharmacist before using salt substitutes.  Eat lower-sodium products, often labeled as "lower sodium" or "no salt added."  Eat fresh foods.  Eat more vegetables, fruits, and low-fat dairy products.  Choose whole grains. Look for the word "whole" as the first word in the ingredient list.  Choose fish and skinless chicken or [place] more often than red meat. Limit fish, poultry, and meat to 6 oz (170 g) each day.  Limit sweets, desserts, sugars, and sugary drinks.  Choose heart-healthy fats.  Limit cheese to 1 oz (28 g) per day.  Eat more home-cooked food and less restaurant, buffet, and fast food.  Limit fried foods.  Cook foods using methods other than frying.  Limit canned vegetables. If you do use them, rinse them well to decrease the sodium.  When eating at a  restaurant, ask that your food be prepared with less salt, or no salt if possible. WHAT FOODS CAN I EAT? Seek help from a dietitian for individual calorie needs. Grains Whole grain or whole wheat bread. Brown rice. Whole grain or whole wheat pasta. Quinoa, bulgur, and whole grain cereals. Low-sodium cereals. Corn or whole wheat flour tortillas. Whole grain cornbread. Whole grain crackers. Low-sodium crackers. Vegetables Fresh or frozen vegetables (raw, steamed, roasted, or grilled). Low-sodium or reduced-sodium tomato and vegetable juices. Low-sodium or reduced-sodium tomato sauce and paste. Low-sodium or reduced-sodium canned vegetables.  Fruits All fresh, canned (in natural juice), or frozen fruits. Meat and Other Protein Products Ground beef (85% or leaner), grass-fed beef, or beef trimmed of fat. Skinless chicken or [place]. Ground chicken or [place]. Pork trimmed of fat. All fish and seafood. Eggs. Dried beans, peas, or lentils. Unsalted nuts and seeds. Unsalted canned beans. Dairy Low-fat dairy products, such as skim or 1% milk, 2% or reduced-fat cheeses, low-fat ricotta or cottage cheese, or plain low-fat yogurt. Low-sodium or reduced-sodium cheeses. Fats and Oils Tub margarines without trans fats. Light or reduced-fat mayonnaise and salad dressings (reduced sodium). Avocado. Safflower, olive, or canola oils. Natural peanut or almond butter. Other Unsalted popcorn and pretzels. The  items listed above may not be a complete list of recommended foods or beverages. Contact your dietitian for more options. WHAT FOODS ARE NOT RECOMMENDED? Grains White bread. White pasta. White rice. Refined cornbread. Bagels and croissants. Crackers that contain trans fat. Vegetables Creamed or fried vegetables. Vegetables in a cheese sauce. Regular canned vegetables. Regular canned tomato sauce and paste. Regular tomato and vegetable juices. Fruits Dried fruits. Canned fruit in light or heavy syrup. Fruit  juice. Meat and Other Protein Products Fatty cuts of meat. Ribs, chicken wings, bacon, sausage, bologna, salami, chitterlings, fatback, hot dogs, bratwurst, and packaged luncheon meats. Salted nuts and seeds. Canned beans with salt. Dairy Whole or 2% milk, cream, half-and-half, and cream cheese. Whole-fat or sweetened yogurt. Full-fat cheeses or blue cheese. Nondairy creamers and whipped toppings. Processed cheese, cheese spreads, or cheese curds. Condiments Onion and garlic salt, seasoned salt, table salt, and sea salt. Canned and packaged gravies. Worcestershire sauce. Tartar sauce. Barbecue sauce. Teriyaki sauce. Soy sauce, including reduced sodium. Steak sauce. Fish sauce. Oyster sauce. Cocktail sauce. Horseradish. Ketchup and mustard. Meat flavorings and tenderizers. Bouillon cubes. Hot sauce. Tabasco sauce. Marinades. Taco seasonings. Relishes. Fats and Oils Butter, stick margarine, lard, shortening, ghee, and bacon fat. Coconut, palm kernel, or palm oils. Regular salad dressings. Other Pickles and olives. Salted popcorn and pretzels. The items listed above may not be a complete list of foods and beverages to avoid. Contact your dietitian for more information. WHERE CAN I FIND MORE INFORMATION? National Heart, Lung, and Blood Institute: travelstabloid.com Document Released: 04/27/2011 Document Revised: 09/22/2013 Document Reviewed: 03/12/2013 [facility] Patient Information 2015 [place], [place]. This information is not intended to replace advice given to you by your health care provider. Make sure you discuss any questions you have with your health care provider.  Blood Pressure Record Sheet Your blood pressure on this visit to the emergency department or clinic is elevated. This does not necessarily mean you have high blood pressure (hypertension), but it does mean that your blood pressure needs to be rechecked. Many times your blood pressure can increase due to  illness, pain, anxiety, or other factors. We recommend that you get a series of blood pressure readings done over a period of 5 days. It is best to get a reading in the morning and one in the evening. You should make sure to sit and relax for 1-5 minutes before the reading is taken. Write the readings down and make a follow-up appointment with your health care provider to discuss the results. If there is not a free clinic or a drug store with a blood-pressure-taking machine near you, you can purchase blood-pressure-taking equipment from a drug store. Having one in the home allows you the convenience of taking your blood pressure while you are home and relaxed.  Your blood pressure in the emergency department or clinic on ________ was ____________________. BLOOD PRESSURE LOG Date: _______________________  a.m. _____________________  p.m. _____________________ Date: _______________________  a.m. _____________________  p.m. _____________________ Date: _______________________  a.m. _____________________  p.m. _____________________ Date: _______________________  a.m. _____________________  p.m. _____________________ Date: _______________________  a.m. _____________________  p.m. _____________________ Document Released: 02/04/2003 Document Revised: 09/22/2013 Document Reviewed: 07/01/2013 [employer] Patient Information 2015 [place], [place]. This information is not intended to replace advice given to you by your health care provider. Make sure you discuss any questions you have with your health care provider.  How to Take Your Blood Pressure HOW DO I GET A BLOOD PRESSURE MACHINE?  You can buy an electronic home  blood pressure machine at your local pharmacy. Insurance will sometimes cover the cost if you have a prescription.  Ask your doctor what type of machine is best for you. There are different machines for your arm and your wrist.  If you decide to buy a machine to check your  blood pressure on your arm, first check the size of your arm so you can buy the right size cuff. To check the size of your arm:   Use a measuring tape that shows both inches and centimeters.   Wrap the measuring tape around the upper-middle part of your arm. You may need someone to help you measure.   Write down your arm measurement in both inches and centimeters.   To measure your blood pressure correctly, it is important to have the right size cuff.   If your arm is up to 13 inches (up to 34 centimeters), get an adult cuff size.  If your arm is 13 to 17 inches (35 to 44 centimeters), get a large adult cuff size.    If your arm is 17 to 20 inches (45 to 52 centimeters), get an adult thigh cuff.  WHAT DO THE NUMBERS MEAN?   There are two numbers that make up your blood pressure. For example: 120/80.  The first number (120 in our example) is called the "systolic pressure." It is a measure of the pressure in your blood vessels when your heart is pumping blood.  The second number (80 in our example) is called the "diastolic pressure." It is a measure of the pressure in your blood vessels when your heart is resting between beats.  Your doctor will tell you what your blood pressure should be. WHAT SHOULD I DO BEFORE I CHECK MY BLOOD PRESSURE?   Try to rest or relax for at least 30 minutes before you check your blood pressure.  Do not smoke.  Do not have any drinks with caffeine, such as:  Soda.  Coffee.  Tea.  Check your blood pressure in a quiet room.  Sit down and stretch out your arm on a table. Keep your arm at about the level of your heart. Let your arm relax.  Make sure that your legs are not crossed. HOW DO I CHECK MY BLOOD PRESSURE?  Follow the directions that came with your machine.  Make sure you remove any tight-fighting clothing from your arm or wrist. Wrap the cuff around your upper arm or wrist. You should be able to fit a finger between the cuff and  your arm. If you cannot fit a finger between the cuff and your arm, it is too tight and should be removed and rewrapped.  Some units require you to manually pump up the arm cuff.  Automatic units inflate the cuff when you press a button.  Cuff deflation is automatic in both models.  After the cuff is inflated, the unit measures your blood pressure and pulse. The readings are shown on a monitor. Hold still and breathe normally while the cuff is inflated.  Getting a reading takes less than a minute.  Some models store readings in a memory. Some provide a printout of readings. If your machine does not store your readings, keep a written record.  Take readings with you to your next visit with your doctor. Document Released: 04/20/2008 Document Revised: 09/22/2013 Document Reviewed: 07/03/2013 [facility] Patient Information 2015 [place], [place]. This information is not intended to replace advice given to you by your health care provider. Make  sure you discuss any questions you have with your health care provider.  Medication Refill, Emergency Department We have refilled your medication today as a courtesy to you. It is best for your medical care, however, to take care of getting refills done through your primary caregiver's office. They have your records and can do a better job of follow-up than we can in the emergency department. On maintenance medications, we often only prescribe enough medications to get you by until you are able to see your regular caregiver. This is a more expensive way to refill medications. In the future, please plan for refills so that you will not have to use the emergency department for this. Thank you for your help. Your help allows [place] to better take care of the daily emergencies that enter our department. Document Released: 08/25/2003 Document Revised: 07/31/2011 Document Reviewed: 08/15/2013 [facility] Patient Information 2015 [place], [place]. This information is not  intended to replace advice given to you by your health care provider. Make sure you discuss any questions you have with your health care provider.  Managing Your High Blood Pressure Blood pressure is a measurement of how forceful your blood is pressing against the walls of the arteries. Arteries are muscular tubes within the circulatory system. Blood pressure does not stay the same. Blood pressure rises when you are active, excited, or nervous; and it lowers during sleep and relaxation. If the numbers measuring your blood pressure stay above normal most of the time, you are at risk for health problems. High blood pressure (hypertension) is a long-term (chronic) condition in which blood pressure is elevated. A blood pressure reading is recorded as two numbers, such as 120 over 80 (or 120/80). The first, higher number is called the systolic pressure. It is a measure of the pressure in your arteries as the heart beats. The second, lower number is called the diastolic pressure. It is a measure of the pressure in your arteries as the heart relaxes between beats.  Keeping your blood pressure in a normal range is important to your overall health and prevention of health problems, such as heart disease and stroke. When your blood pressure is uncontrolled, your heart has to work harder than normal. High blood pressure is a very common condition in adults because blood pressure tends to rise with age. Men and women are equally likely to have hypertension but at different times in life. Before age 65, men are more likely to have hypertension. After 45 years of age, women are more likely to have it. Hypertension is especially common in African Americans. This condition often has no signs or symptoms. The cause of the condition is usually not known. Your caregiver can help you come up with a plan to keep your blood pressure in a normal, healthy range. BLOOD PRESSURE STAGES Blood pressure is classified into four stages:  normal, prehypertension, stage 1, and stage 2. Your blood pressure reading will be used to determine what type of treatment, if any, is necessary. Appropriate treatment options are tied to these four stages:  Normal  Systolic pressure (mm Hg): below 120.  Diastolic pressure (mm Hg): below 80. Prehypertension  Systolic pressure (mm Hg): 120 to 139.  Diastolic pressure (mm Hg): 80 to 89. Stage1  Systolic pressure (mm Hg): 140 to 159.  Diastolic pressure (mm Hg): 90 to 99. Stage2  Systolic pressure (mm Hg): 160 or above.  Diastolic pressure (mm Hg): 100 or above. RISKS RELATED TO HIGH BLOOD PRESSURE Managing your blood pressure  is an important responsibility. Uncontrolled high blood pressure can lead to:  A heart attack.  A stroke.  A weakened blood vessel (aneurysm).  Heart failure.  Kidney damage.  Eye damage.  Metabolic syndrome.  Memory and concentration problems. HOW TO MANAGE YOUR BLOOD PRESSURE Blood pressure can be managed effectively with lifestyle changes and medicines (if needed). Your caregiver will help you come up with a plan to bring your blood pressure within a normal range. Your plan should include the following: Education  Read all information provided by your caregivers about how to control blood pressure.  Educate yourself on the latest guidelines and treatment recommendations. New research is always being done to further define the risks and treatments for high blood pressure. Lifestylechanges  Control your weight.  Avoid smoking.  Stay physically active.  Reduce the amount of salt in your diet.  Reduce stress.  Control any chronic conditions, such as high cholesterol or diabetes.  Reduce your alcohol intake. Medicines  Several medicines (antihypertensive medicines) are available, if needed, to bring blood pressure within a normal range. Communication  Review all the medicines you take with your caregiver because there may be side  effects or interactions.  Talk with your caregiver about your diet, exercise habits, and other lifestyle factors that may be contributing to high blood pressure.  See your caregiver regularly. Your caregiver can help you create and adjust your plan for managing high blood pressure. RECOMMENDATIONS FOR TREATMENT AND FOLLOW-UP  The following recommendations are based on current guidelines for managing high blood pressure in nonpregnant adults. Use these recommendations to identify the proper follow-up period or treatment option based on your blood pressure reading. You can discuss these options with your caregiver.  Systolic pressure of 123456 to [id number] or diastolic pressure of 80 to 89: Follow up with your caregiver as directed.  Systolic pressure of [id number] to 0000000 or diastolic pressure of 90 to 100: Follow up with your caregiver within 2 months.  Systolic pressure above 0000000 or diastolic pressure above [id number]: Follow up with your caregiver within 1 month.  Systolic pressure above [id number] or diastolic pressure above [id number]: Consider antihypertensive therapy; follow up with your caregiver within 1 week.  Systolic pressure above [id number] or diastolic pressure above 123456: Begin antihypertensive therapy; follow up with your caregiver within 1 week. Document Released: 01/31/2012 Document Reviewed: 01/31/2012 [facility] Patient Information 2015 [place]. This information is not intended to replace advice given to you by your health care provider. Make sure you discuss any questions you have with your health care provider.

## 2014-03-08 NOTE — ED Notes (Signed)
Patient ran out of her medications Thursday and cannot get into th eclinic and was told to come to the ER

## 2014-03-09 NOTE — ED Provider Notes (Signed)
Medical screening examination/treatment/procedure(s) were performed by non-physician practitioner and as supervising physician I was immediately available for consultation/collaboration.   EKG Interpretation   Date/Time:  Sunday March 08 2014 15:41:53 EDT Ventricular Rate:  105 PR Interval:  168 QRS Duration: 96 QT Interval:  344 QTC Calculation: 454 R Axis:   53 Text Interpretation:  Sinus tachycardia T wave abnormality, consider  inferolateral ischemia Abnormal ECG no STEMI. no significant chane  compared with old Confirmed by Johnney Killian, MD, Jeannie Done [phone]) on 03/08/2014  5:20:04 PM       Charlesetta Shanks, MD 03/09/14 [id number]

## 2014-03-10 ENCOUNTER — Telehealth (HOSPITAL_BASED_OUTPATIENT_CLINIC_OR_DEPARTMENT_OTHER): Payer: Self-pay | Admitting: *Deleted

## 2014-03-10 MED ORDER — LEVOTHYROXINE SODIUM 50 MCG PO TABS: 50.0000 ug | ORAL_TABLET | Freq: Every day | ORAL | Status: DC

## 2014-03-10 NOTE — ED Provider Notes (Signed)
68F seen a few days ago, given Rx refills. Asked for levothyroxine refill, did not receive it. Other home meds refilled. Levothyroxine Rx given.  Evelina Bucy, MD 03/10/14 [phone]

## 2014-07-03 ENCOUNTER — Encounter: Payer: Self-pay | Admitting: Internal Medicine

## 2014-07-03 ENCOUNTER — Ambulatory Visit: Payer: Medicaid Other | Attending: Internal Medicine | Admitting: Internal Medicine

## 2014-07-03 VITALS — BP 150/92 | HR 80 | Temp 98.0°F | Resp 16 | Wt 202.0 lb

## 2014-07-03 DIAGNOSIS — Z139 Encounter for screening, unspecified: Secondary | ICD-10-CM

## 2014-07-03 DIAGNOSIS — E139 Other specified diabetes mellitus without complications: Secondary | ICD-10-CM

## 2014-07-03 DIAGNOSIS — E1169 Type 2 diabetes mellitus with other specified complication: Secondary | ICD-10-CM | POA: Insufficient documentation

## 2014-07-03 DIAGNOSIS — J320 Chronic maxillary sinusitis: Secondary | ICD-10-CM | POA: Insufficient documentation

## 2014-07-03 DIAGNOSIS — E669 Obesity, unspecified: Secondary | ICD-10-CM

## 2014-07-03 DIAGNOSIS — Z7982 Long term (current) use of aspirin: Secondary | ICD-10-CM | POA: Insufficient documentation

## 2014-07-03 DIAGNOSIS — E785 Hyperlipidemia, unspecified: Secondary | ICD-10-CM

## 2014-07-03 DIAGNOSIS — R0981 Nasal congestion: Secondary | ICD-10-CM

## 2014-07-03 DIAGNOSIS — I509 Heart failure, unspecified: Secondary | ICD-10-CM | POA: Insufficient documentation

## 2014-07-03 DIAGNOSIS — I10 Essential (primary) hypertension: Secondary | ICD-10-CM | POA: Insufficient documentation

## 2014-07-03 DIAGNOSIS — Z23 Encounter for immunization: Secondary | ICD-10-CM | POA: Insufficient documentation

## 2014-07-03 DIAGNOSIS — E039 Hypothyroidism, unspecified: Secondary | ICD-10-CM | POA: Insufficient documentation

## 2014-07-03 DIAGNOSIS — E119 Type 2 diabetes mellitus without complications: Secondary | ICD-10-CM | POA: Insufficient documentation

## 2014-07-03 DIAGNOSIS — Z1231 Encounter for screening mammogram for malignant neoplasm of breast: Secondary | ICD-10-CM

## 2014-07-03 HISTORY — DX: Hyperlipidemia, unspecified: E78.5

## 2014-07-03 HISTORY — DX: Hypothyroidism, unspecified: E03.9

## 2014-07-03 HISTORY — DX: Essential (primary) hypertension: I10

## 2014-07-03 HISTORY — DX: Type 2 diabetes mellitus with other specified complication: E66.9

## 2014-07-03 HISTORY — DX: Type 2 diabetes mellitus with other specified complication: E11.69

## 2014-07-03 LAB — CBC WITH DIFFERENTIAL/PLATELET
BASOS ABS: 0 10*3/uL (ref 0.0–0.1)
Basophils Relative: 0 % (ref 0–1)
Eosinophils Absolute: 0.2 10*3/uL (ref 0.0–0.7)
Eosinophils Relative: 3 % (ref 0–5)
HCT: 37.2 % (ref 36.0–46.0)
Hemoglobin: 12 g/dL (ref 12.0–15.0)
Lymphocytes Relative: 35 % (ref 12–46)
Lymphs Abs: 1.9 10*3/uL (ref 0.7–4.0)
MCH: 29.1 pg (ref 26.0–34.0)
MCHC: 32.3 g/dL (ref 30.0–36.0)
MCV: 90.3 fL (ref 78.0–100.0)
MPV: 11.1 fL (ref 8.6–12.4)
Monocytes Absolute: 0.7 10*3/uL (ref 0.1–1.0)
Monocytes Relative: 13 % — ABNORMAL HIGH (ref 3–12)
NEUTROS ABS: 2.7 10*3/uL (ref 1.7–7.7)
NEUTROS PCT: 49 % (ref 43–77)
PLATELETS: 293 10*3/uL (ref 150–400)
RBC: 4.12 MIL/uL (ref 3.87–5.11)
RDW: 14.3 % (ref 11.5–15.5)
WBC: 5.5 10*3/uL (ref 4.0–10.5)

## 2014-07-03 LAB — COMPLETE METABOLIC PANEL WITH GFR
ALBUMIN: 3.6 g/dL (ref 3.5–5.2)
ALT: 13 U/L (ref 0–35)
AST: 17 U/L (ref 0–37)
Alkaline Phosphatase: 54 U/L (ref 39–117)
BILIRUBIN TOTAL: 0.4 mg/dL (ref 0.2–1.2)
BUN: 18 mg/dL (ref 6–23)
CO2: 27 meq/L (ref 19–32)
CREATININE: 0.81 mg/dL (ref 0.50–1.10)
Calcium: 9.3 mg/dL (ref 8.4–10.5)
Chloride: 105 mEq/L (ref 96–112)
GFR, Est African American: >89 mL/min
GFR, Est Non African American: 88 mL/min
GLUCOSE: 106 mg/dL — AB (ref 70–99)
Potassium: 5.3 mEq/L (ref 3.5–5.3)
SODIUM: 142 meq/L (ref 135–145)
TOTAL PROTEIN: 7 g/dL (ref 6.0–8.3)

## 2014-07-03 LAB — POCT GLYCOSYLATED HEMOGLOBIN (HGB A1C): Hemoglobin A1C: 6.3

## 2014-07-03 LAB — GLUCOSE, POCT (MANUAL RESULT ENTRY): POC GLUCOSE: 124 mg/dL — AB (ref 70–99)

## 2014-07-03 LAB — TSH: TSH: 1.594 u[IU]/mL (ref 0.350–4.500)

## 2014-07-03 MED ORDER — AMLODIPINE BESYLATE 10 MG PO TABS: 10.0000 mg | ORAL_TABLET | Freq: Every day | ORAL | Status: DC

## 2014-07-03 MED ORDER — CARVEDILOL 12.5 MG PO TABS: 12.5000 mg | ORAL_TABLET | Freq: Two times a day (BID) | ORAL | Status: DC

## 2014-07-03 MED ORDER — METFORMIN HCL 500 MG PO TABS: 500.0000 mg | ORAL_TABLET | Freq: Two times a day (BID) | ORAL | Status: DC

## 2014-07-03 MED ORDER — FLUTICASONE PROPIONATE 50 MCG/ACT NA SUSP: 2.0000 | Freq: Every day | NASAL | Status: DC

## 2014-07-03 MED ORDER — LISINOPRIL 10 MG PO TABS: 10.0000 mg | ORAL_TABLET | Freq: Every day | ORAL | Status: DC

## 2014-07-03 MED ORDER — LEVOTHYROXINE SODIUM 50 MCG PO TABS: 50.0000 ug | ORAL_TABLET | Freq: Every day | ORAL | Status: DC

## 2014-07-03 MED ORDER — FUROSEMIDE 20 MG PO TABS: 20.0000 mg | ORAL_TABLET | Freq: Two times a day (BID) | ORAL | Status: DC

## 2014-07-03 MED ORDER — AZITHROMYCIN 250 MG PO TABS: ORAL_TABLET | ORAL | Status: DC

## 2014-07-03 MED ORDER — PRAVASTATIN SODIUM 10 MG PO TABS: 10.0000 mg | ORAL_TABLET | Freq: Every day | ORAL | Status: DC

## 2014-07-03 NOTE — Progress Notes (Signed)
Patient here to establish care Has history of HTN and diabetes Recently on ABT for sinus infection-complains of still having congestion and sinus pressure

## 2014-07-03 NOTE — Progress Notes (Signed)
Patient Demographics  Linda Phelps, is a 44 y.o. female  [id number]  [id number]  DOB - 1970-06-21  CC:  Chief Complaint  Patient presents with  . Establish Care  . Sinusitis       HPI: Linda Phelps is a 44 y.o. female here today to establish medical care.Patient has history of hypertension diabetes, hyperlipidemia, hypothyroidism, CHF had been to the emergency room several times for medication refills since she could not follow up with his previous provider, as per patient she has missed the doses is off her medications, her blood pressure is borderline elevated today, she denies any headache dizziness chest and shortness of breath, as per patient she takes levothyroxine 50 mcg daily and has been on this same dose for a year but has not been able to take recent, she also complains of sinus trouble postnasal. Currently denies any fever chills. Patient has No headache, No chest pain, No abdominal pain - No Nausea, No new weakness tingling or numbness, No Cough - SOB.  No Known Allergies Past Medical History  Diagnosis Date  . CHF (congestive heart failure)   . Hypertension   . Obesity   . Diabetes mellitus without complication   . High cholesterol   . Ectopic pregnancy    Current Outpatient Prescriptions on File Prior to Visit  Medication Sig Dispense Refill  . aspirin 81 MG tablet Take 81 mg by mouth daily.    . miconazole (MICOTIN) 2 % cream Apply 1 application topically 2 (two) times daily. To hands and feet 56.7 g 1   No current facility-administered medications on file prior to visit.   Family History  Problem Relation Age of Onset  . Diabetes Mother   . Heart disease Mother   . Diabetes Maternal Grandmother    History   Social History  . Marital Status: Legally Separated    Spouse Name: N/A  . Number of Children: N/A  . Years of Education: N/A   Occupational History  . Not on file.   Social History Main Topics  . Smoking status: Never Smoker   .  Smokeless tobacco: Not on file  . Alcohol Use: 0.0 oz/week    0 Standard drinks or equivalent per week     Comment: occ  . Drug Use: No  . Sexual Activity: Yes    Birth Control/ Protection: None   Other Topics Concern  . Not on file   Social History Narrative    Review of Systems: Constitutional: Negative for fever, chills, diaphoresis, activity change, appetite change and fatigue. HENT: Negative for ear pain, nosebleeds, congestion, facial swelling, rhinorrhea, neck pain, neck stiffness and ear discharge.  Eyes: Negative for pain, discharge, redness, itching and visual disturbance. Respiratory: Negative for cough, choking, chest tightness, shortness of breath, wheezing and stridor.  Cardiovascular: Negative for chest pain, palpitations and leg swelling. Gastrointestinal: Negative for abdominal distention. Genitourinary: Negative for dysuria, urgency, frequency, hematuria, flank pain, decreased urine volume, difficulty urinating and dyspareunia.  Musculoskeletal: Negative for back pain, joint swelling, arthralgia and gait problem. Neurological: Negative for dizziness, tremors, seizures, syncope, facial asymmetry, speech difficulty, weakness, light-headedness, numbness and headaches.  Hematological: Negative for adenopathy. Does not bruise/bleed easily. Psychiatric/Behavioral: Negative for hallucinations, behavioral problems, confusion, dysphoric mood, decreased concentration and agitation.    Objective:   Filed Vitals:   07/03/14 0919  BP: 150/92  Pulse: 80  Temp: 98 F (36.7 C)  Resp: 16    Physical Exam: Constitutional: Patient appears well-developed and  well-nourished. No distress. HENT: Normocephalic, atraumatic, External right and left ear normal. Oropharynx is clear and moist.  Eyes: Conjunctivae and EOM are normal. PERRLA, no scleral icterus. Neck: Normal ROM. Neck supple. No JVD. No tracheal deviation. No thyromegaly. CVS: RRR, S1/S2 +, no murmurs, no gallops, no  carotid bruit.  Pulmonary: Effort and breath sounds normal, no stridor, rhonchi, wheezes, rales.  Abdominal: Soft. BS +, no distension, tenderness, rebound or guarding.  Musculoskeletal: Normal range of motion. No edema and no tenderness.  Neuro: Alert. Normal reflexes, muscle tone coordination. No cranial nerve deficit. Skin: Skin is warm and dry. No rash noted. Not diaphoretic. No erythema. No pallor. Psychiatric: Normal mood and affect. Behavior, judgment, thought content normal.  Lab Results  Component Value Date   WBC 5.6 03/08/2014   HGB 12.5 03/08/2014   HCT 38.3 03/08/2014   MCV 90.5 03/08/2014   PLT 254 03/08/2014   Lab Results  Component Value Date   CREATININE 0.90 03/08/2014   BUN 21 03/08/2014   NA 143 03/08/2014   K 4.0 03/08/2014   CL 105 03/08/2014   CO2 24 03/08/2014    Lab Results  Component Value Date   HGBA1C 6.30 07/03/2014   Lipid Panel  No results found for: CHOL, TRIG, HDL, CHOLHDL, VLDL, LDLCALC     Assessment and plan:   1. Other specified diabetes mellitus without complications Results for orders placed or performed in visit on 07/03/14  Glucose (CBG)  Result Value Ref Range   POC Glucose 124 (A) 70 - 99 mg/dl  HgB A1c  Result Value Ref Range   Hemoglobin A1C 6.30    Diabetes is fairly well controlled, continue with current meds. Repeat A1c in 3 months. - Glucose (CBG) - HgB A1c - metFORMIN (GLUCOPHAGE) 500 MG tablet; Take 1 tablet (500 mg total) by mouth 2 (two) times daily with a meal.  Dispense: 60 tablet; Refill: 3  2. Essential hypertension Advised patient for DASH diet, medication refill done. - lisinopril (PRINIVIL,ZESTRIL) 10 MG tablet; Take 1 tablet (10 mg total) by mouth daily.  Dispense: 30 tablet; Refill: 3 - amLODipine (NORVASC) 10 MG tablet; Take 1 tablet (10 mg total) by mouth daily.  Dispense: 30 tablet; Refill: 3 - furosemide (LASIX) 20 MG tablet; Take 1 tablet (20 mg total) by mouth 2 (two) times daily.  Dispense: 60  tablet; Refill: 3 - carvedilol (COREG) 12.5 MG tablet; Take 1 tablet (12.5 mg total) by mouth 2 (two) times daily with a meal.  Dispense: 60 tablet; Refill: 3 - CBC with Differential/Platelet - COMPLETE METABOLIC PANEL WITH GFR  3. Congestive heart failure, unspecified congestive heart failure chronicity, unspecified congestive heart failure type  - furosemide (LASIX) 20 MG tablet; Take 1 tablet (20 mg total) by mouth 2 (two) times daily.  Dispense: 60 tablet; Refill: 3 - carvedilol (COREG) 12.5 MG tablet; Take 1 tablet (12.5 mg total) by mouth 2 (two) times daily with a meal.  Dispense: 60 tablet; Refill: 3 - Ambulatory referral to Cardiology  4. Hyperlipidemia Resume back on Pravachol, will check fasting lipid panel on the next visit. - pravastatin (PRAVACHOL) 10 MG tablet; Take 1 tablet (10 mg total) by mouth daily.  Dispense: 30 tablet; Refill: 3  5. Hypothyroidism, unspecified hypothyroidism type Continue with levothyroxine 50 mcg daily, check TSH today and repeat on the following visit. - levothyroxine (SYNTHROID, LEVOTHROID) 50 MCG tablet; Take 1 tablet (50 mcg total) by mouth daily before breakfast.  Dispense: 30 tablet; Refill: 3 -  TSH  6. Nasal congestion  - fluticasone (FLONASE) 50 MCG/ACT nasal spray; Place 2 sprays into both nostrils daily.  Dispense: 16 g; Refill: 6  7. Needs flu shot Flu shot given today  8. Maxillary sinusitis, unspecified chronicity  - azithromycin (ZITHROMAX Z-PAK) 250 MG tablet; Take as directed  Dispense: 6 each; Refill: 0  9. Screening  - Vit D  25 hydroxy (rtn osteoporosis monitoring)  10. Encounter for screening mammogram for breast cancer  - MM DIGITAL SCREENING BILATERAL; Future        Health Maintenance -Pap Smear: uptodate  -Mammogram: ordered  -Vaccinations:  Flu shot given today   Return in about 3 months (around 10/01/2014) for diabetes, hypertension.   Lorayne Marek, MD

## 2014-07-03 NOTE — Patient Instructions (Signed)
DASH Eating Plan °DASH stands for "Dietary Approaches to Stop Hypertension." The DASH eating plan is a healthy eating plan that has been shown to reduce high blood pressure (hypertension). Additional health benefits may include reducing the risk of type 2 diabetes mellitus, heart disease, and stroke. The DASH eating plan may also help with weight loss. °WHAT DO I NEED TO KNOW ABOUT THE DASH EATING PLAN? °For the DASH eating plan, you will follow these general guidelines: °· Choose foods with a percent daily value for sodium of less than 5% (as listed on the food label). °· Use salt-free seasonings or herbs instead of table salt or sea salt. °· Check with your health care provider or pharmacist before using salt substitutes. °· Eat lower-sodium products, often labeled as "lower sodium" or "no salt added." °· Eat fresh foods. °· Eat more vegetables, fruits, and low-fat dairy products. °· Choose whole grains. Look for the word "whole" as the first word in the ingredient list. °· Choose fish and skinless chicken or turkey more often than red meat. Limit fish, poultry, and meat to 6 oz (170 g) each day. °· Limit sweets, desserts, sugars, and sugary drinks. °· Choose heart-healthy fats. °· Limit cheese to 1 oz (28 g) per day. °· Eat more home-cooked food and less restaurant, buffet, and fast food. °· Limit fried foods. °· Cook foods using methods other than frying. °· Limit canned vegetables. If you do use them, rinse them well to decrease the sodium. °· When eating at a restaurant, ask that your food be prepared with less salt, or no salt if possible. °WHAT FOODS CAN I EAT? °Seek help from a dietitian for individual calorie needs. °Grains °Whole grain or whole wheat bread. Brown rice. Whole grain or whole wheat pasta. Quinoa, bulgur, and whole grain cereals. Low-sodium cereals. Corn or whole wheat flour tortillas. Whole grain cornbread. Whole grain crackers. Low-sodium crackers. °Vegetables °Fresh or frozen vegetables  (raw, steamed, roasted, or grilled). Low-sodium or reduced-sodium tomato and vegetable juices. Low-sodium or reduced-sodium tomato sauce and paste. Low-sodium or reduced-sodium canned vegetables.  °Fruits °All fresh, canned (in natural juice), or frozen fruits. °Meat and Other Protein Products °Ground beef (85% or leaner), grass-fed beef, or beef trimmed of fat. Skinless chicken or turkey. Ground chicken or turkey. Pork trimmed of fat. All fish and seafood. Eggs. Dried beans, peas, or lentils. Unsalted nuts and seeds. Unsalted canned beans. °Dairy °Low-fat dairy products, such as skim or 1% milk, 2% or reduced-fat cheeses, low-fat ricotta or cottage cheese, or plain low-fat yogurt. Low-sodium or reduced-sodium cheeses. °Fats and Oils °Tub margarines without trans fats. Light or reduced-fat mayonnaise and salad dressings (reduced sodium). Avocado. Safflower, olive, or canola oils. Natural peanut or almond butter. °Other °Unsalted popcorn and pretzels. °The items listed above may not be a complete list of recommended foods or beverages. Contact your dietitian for more options. °WHAT FOODS ARE NOT RECOMMENDED? °Grains °White bread. White pasta. White rice. Refined cornbread. Bagels and croissants. Crackers that contain trans fat. °Vegetables °Creamed or fried vegetables. Vegetables in a cheese sauce. Regular canned vegetables. Regular canned tomato sauce and paste. Regular tomato and vegetable juices. °Fruits °Dried fruits. Canned fruit in light or heavy syrup. Fruit juice. °Meat and Other Protein Products °Fatty cuts of meat. Ribs, chicken wings, bacon, sausage, bologna, salami, chitterlings, fatback, hot dogs, bratwurst, and packaged luncheon meats. Salted nuts and seeds. Canned beans with salt. °Dairy °Whole or 2% milk, cream, half-and-half, and cream cheese. Whole-fat or sweetened yogurt. Full-fat   cheeses or blue cheese. Nondairy creamers and whipped toppings. Processed cheese, cheese spreads, or cheese  curds. °Condiments °Onion and garlic salt, seasoned salt, table salt, and sea salt. Canned and packaged gravies. Worcestershire sauce. Tartar sauce. Barbecue sauce. Teriyaki sauce. Soy sauce, including reduced sodium. Steak sauce. Fish sauce. Oyster sauce. Cocktail sauce. Horseradish. Ketchup and mustard. Meat flavorings and tenderizers. Bouillon cubes. Hot sauce. Tabasco sauce. Marinades. Taco seasonings. Relishes. °Fats and Oils °Butter, stick margarine, lard, shortening, ghee, and bacon fat. Coconut, palm kernel, or palm oils. Regular salad dressings. °Other °Pickles and olives. Salted popcorn and pretzels. °The items listed above may not be a complete list of foods and beverages to avoid. Contact your dietitian for more information. °WHERE CAN I FIND MORE INFORMATION? °National Heart, [employer], and [employer] Institute: www.nhlbi.nih.gov/health/health-topics/topics/dash/ °Document Released: 04/27/2011 Document Revised: 09/22/2013 Document Reviewed: 03/12/2013 °ExitCare® Patient Information ©2015 [employer]. This information is not intended to replace advice given to you by your health care provider. Make sure you discuss any questions you have with your health care provider. ° °

## 2014-07-04 LAB — VITAMIN D 25 HYDROXY (VIT D DEFICIENCY, FRACTURES): Vit D, 25-Hydroxy: 8 ng/mL — ABNORMAL LOW (ref 30–100)

## 2014-07-10 ENCOUNTER — Telehealth: Payer: Self-pay

## 2014-07-10 MED ORDER — VITAMIN D (ERGOCALCIFEROL) 1.25 MG (50000 UNIT) PO CAPS: 50000.0000 [IU] | ORAL_CAPSULE | ORAL | Status: DC

## 2014-07-10 NOTE — Telephone Encounter (Signed)
Patient not available Left message on voice mail to return our call 

## 2014-07-10 NOTE — Telephone Encounter (Signed)
-----   Message from Lorayne Marek, MD sent at 07/06/2014 10:44 AM EST ----- Blood work reviewed, noticed low vitamin D, call patient advise to start ergocalciferol 50,000 units once a week for the duration of  12 weeks.

## 2014-07-13 ENCOUNTER — Telehealth: Payer: Self-pay

## 2014-07-13 NOTE — Telephone Encounter (Signed)
Patient returned phone call and she is aware of her lab results And to pick up her prescription at our pharmacy

## 2014-07-15 ENCOUNTER — Ambulatory Visit: Payer: Medicaid Other | Admitting: Cardiology

## 2014-07-22 ENCOUNTER — Ambulatory Visit: Payer: Medicaid Other | Admitting: Cardiology

## 2014-08-12 ENCOUNTER — Ambulatory Visit: Payer: Medicaid Other | Admitting: Cardiology

## 2014-09-23 ENCOUNTER — Encounter: Payer: Self-pay | Admitting: Cardiology

## 2014-09-23 ENCOUNTER — Ambulatory Visit: Payer: Self-pay | Attending: Cardiology | Admitting: Cardiology

## 2014-09-23 VITALS — BP 133/83 | HR 77 | Temp 98.3°F | Resp 18 | Ht 61.0 in | Wt 197.0 lb

## 2014-09-23 DIAGNOSIS — E780 Pure hypercholesterolemia: Secondary | ICD-10-CM | POA: Insufficient documentation

## 2014-09-23 DIAGNOSIS — E669 Obesity, unspecified: Secondary | ICD-10-CM | POA: Insufficient documentation

## 2014-09-23 DIAGNOSIS — E119 Type 2 diabetes mellitus without complications: Secondary | ICD-10-CM | POA: Insufficient documentation

## 2014-09-23 DIAGNOSIS — I517 Cardiomegaly: Secondary | ICD-10-CM | POA: Insufficient documentation

## 2014-09-23 DIAGNOSIS — I10 Essential (primary) hypertension: Secondary | ICD-10-CM | POA: Insufficient documentation

## 2014-09-23 DIAGNOSIS — Z6837 Body mass index (BMI) 37.0-37.9, adult: Secondary | ICD-10-CM | POA: Insufficient documentation

## 2014-09-23 DIAGNOSIS — I509 Heart failure, unspecified: Secondary | ICD-10-CM | POA: Insufficient documentation

## 2014-09-23 DIAGNOSIS — Z7982 Long term (current) use of aspirin: Secondary | ICD-10-CM | POA: Insufficient documentation

## 2014-09-23 DIAGNOSIS — Z7951 Long term (current) use of inhaled steroids: Secondary | ICD-10-CM | POA: Insufficient documentation

## 2014-09-23 HISTORY — DX: Cardiomegaly: I51.7

## 2014-09-23 NOTE — Patient Instructions (Signed)
Thank you for coming to see Dr. Verl Blalock. We have scheduled a 2D echocardiogram for you at:  [facility] [place]  Wednesday, Sep 30, 2014 at 9 am.   [place] Keep up the good work with your medications and nutrition.

## 2014-09-23 NOTE — Progress Notes (Signed)
Referred by Dr.Advani because patient has history of CHF. Patient was going to cardiologist in [facility] about 5-7 years ago. Patient complains of no pain currently. Patient has "heart palpitations when rushing with work or doing strenuous stuff, but not often"

## 2014-09-23 NOTE — Assessment & Plan Note (Signed)
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 She obviously improved now being 10 years out. Her diagnosis is really cardiomegaly at this point until we assess her LV function and chamber size. We'll obtain 2-D echocardiogram to evaluate this. She'll continue with her current medications.  I spent a long time reinforcing the importance of taking her medications, losing additional weight, tight control of her diabetes and blood pressure and regular walking.  I'll see her back in 3 weeks. We will call her with results of her echocardiogram before then.

## 2014-09-23 NOTE — Progress Notes (Signed)
HPI Linda Phelps is a 44 year old African-American female who comes in today for evaluation of a history of congestive heart failure. She is referred by Dr.Advani.   10 years ago after delivering her youngest child, she was told that she developed congestive heart failure. In fact she was readmitted to the hospital 2 days after discharge at that time with fluid buildup.  She had no real follow-up since then. She has a history of hypertension and was not being treated adequately secondary to noncompliance with medications. She also has obesity and type 2 diabetes. Is been diabetic for 3 years.  She currently denies orthopnea PND or peripheral edema. She does have some palpitations and felt like her heart speeds up significantly when she does routine housework. She denies any syncope. She has had no chest pain.  Since being evaluated here in the clinic, she's been very compliant with her medications. She is currently working with a nutritionist and has lost 7 pounds eating smaller portions. She says she started feeling better.  She visited the emergency room in October 2015 for chest pain. At that time a chest x-ray showed cardiac enlargement but no heart failure. She also had an EKG which showed sinus tachycardia with LVH and strain.  Past Medical History  Diagnosis Date  . CHF (congestive heart failure)   . Hypertension   . Obesity   . Diabetes mellitus without complication   . High cholesterol   . Ectopic pregnancy     Current Outpatient Prescriptions  Medication Sig Dispense Refill  . amLODipine (NORVASC) 10 MG tablet Take 1 tablet (10 mg total) by mouth daily. 30 tablet 3  . aspirin 81 MG tablet Take 81 mg by mouth daily.    Marland Kitchen azithromycin (ZITHROMAX Z-PAK) 250 MG tablet Take as directed 6 each 0  . carvedilol (COREG) 12.5 MG tablet Take 1 tablet (12.5 mg total) by mouth 2 (two) times daily with a meal. 60 tablet 3  . fluticasone (FLONASE) 50 MCG/ACT nasal spray Place 2 sprays into both  nostrils daily. 16 g 6  . furosemide (LASIX) 20 MG tablet Take 1 tablet (20 mg total) by mouth 2 (two) times daily. 60 tablet 3  . levothyroxine (SYNTHROID, LEVOTHROID) 50 MCG tablet Take 1 tablet (50 mcg total) by mouth daily before breakfast. 30 tablet 3  . lisinopril (PRINIVIL,ZESTRIL) 10 MG tablet Take 1 tablet (10 mg total) by mouth daily. 30 tablet 3  . metFORMIN (GLUCOPHAGE) 500 MG tablet Take 1 tablet (500 mg total) by mouth 2 (two) times daily with a meal. 60 tablet 3  . pravastatin (PRAVACHOL) 10 MG tablet Take 1 tablet (10 mg total) by mouth daily. 30 tablet 3  . Vitamin D, Ergocalciferol, (DRISDOL) 50000 UNITS CAPS capsule Take 1 capsule (50,000 Units total) by mouth every 7 (seven) days. 12 capsule 0  . miconazole (MICOTIN) 2 % cream Apply 1 application topically 2 (two) times daily. To hands and feet (Patient not taking: Reported on 09/23/2014) 56.7 g 1   No current facility-administered medications for this visit.    No Known Allergies  Family History  Problem Relation Age of Onset  . Diabetes Mother   . Heart disease Mother   . Diabetes Maternal Grandmother     History   Social History  . Marital Status: Legally Separated    Spouse Name: N/A  . Number of Children: N/A  . Years of Education: N/A   Occupational History  . Not on file.   Social History  Main Topics  . Smoking status: Never Smoker   . Smokeless tobacco: Not on file  . Alcohol Use: 0.0 oz/week    0 Standard drinks or equivalent per week     Comment: occ  . Drug Use: No  . Sexual Activity: Yes    Birth Control/ Protection: None   Other Topics Concern  . Not on file   Social History Narrative    ROS ALL NEGATIVE EXCEPT THOSE NOTED IN HPI  PE  General Appearance: well developed, well nourished in no acute distress, obese HEENT: symmetrical face, PERRLA, good dentition  Neck: no JVD, thyromegaly, or adenopathy, trachea midline Chest: symmetric without deformity Cardiac: PMI poorly  appreciated, RRR, normal S1, S2, no gallop or murmur Lung: clear to ausculation and percussion Vascular: all pulses full without bruits  Extremities: no cyanosis, clubbing or edema, no sign of DVT, no varicosities  Skin: normal color, no rashes Neuro: alert and oriented x 3, non-focal Pysch: normal affect  EKG  BMET    Component Value Date/Time   NA 142 07/03/2014 1005   K 5.3 07/03/2014 1005   CL 105 07/03/2014 1005   CO2 27 07/03/2014 1005   GLUCOSE 106* 07/03/2014 1005   BUN 18 07/03/2014 1005   CREATININE 0.81 07/03/2014 1005   CREATININE 0.90 03/08/2014 1530   CALCIUM 9.3 07/03/2014 1005   GFRNONAA 88 07/03/2014 1005   GFRNONAA 76* 03/08/2014 1530   GFRAA >89 07/03/2014 1005   GFRAA 88* 03/08/2014 1530    Lipid Panel  No results found for: CHOL, TRIG, HDL, CHOLHDL, VLDL, LDLCALC  CBC    Component Value Date/Time   WBC 5.5 07/03/2014 1005   RBC 4.12 07/03/2014 1005   HGB 12.0 07/03/2014 1005   HCT 37.2 07/03/2014 1005   PLT 293 07/03/2014 1005   MCV 90.3 07/03/2014 1005   MCH 29.1 07/03/2014 1005   MCHC 32.3 07/03/2014 1005   RDW 14.3 07/03/2014 1005   LYMPHSABS 1.9 07/03/2014 1005   MONOABS 0.7 07/03/2014 1005   EOSABS 0.2 07/03/2014 1005   BASOSABS 0.0 07/03/2014 1005

## 2014-09-30 ENCOUNTER — Telehealth: Payer: Self-pay | Admitting: *Deleted

## 2014-09-30 ENCOUNTER — Other Ambulatory Visit: Payer: Self-pay | Admitting: Emergency Medicine

## 2014-09-30 ENCOUNTER — Ambulatory Visit (HOSPITAL_BASED_OUTPATIENT_CLINIC_OR_DEPARTMENT_OTHER): Payer: Self-pay

## 2014-09-30 ENCOUNTER — Telehealth: Payer: Self-pay | Admitting: Emergency Medicine

## 2014-09-30 DIAGNOSIS — I517 Cardiomegaly: Secondary | ICD-10-CM

## 2014-09-30 NOTE — Telephone Encounter (Signed)
Please arrange outpatient echocardiogram for cardiomegaly and heart failure. She does not need a stress test.

## 2014-09-30 NOTE — Telephone Encounter (Signed)
Pt given scheduled ECHO appointment per Dr. Verl Blalock Pt is scheduled 10/02/14 @ 2pm. Pt informed to arrive at 145pm

## 2014-09-30 NOTE — Telephone Encounter (Signed)
Patient called to say Dr. Verl Blalock had wanted her to have an echocardiogram and a stress test.  She went to [place], where she thought the appointment was and was told they did not have her scheduled.  Please follow up with patient.

## 2014-10-01 ENCOUNTER — Ambulatory Visit: Payer: Self-pay | Attending: Internal Medicine | Admitting: Internal Medicine

## 2014-10-01 ENCOUNTER — Encounter: Payer: Self-pay | Admitting: Internal Medicine

## 2014-10-01 VITALS — BP 130/80 | HR 74 | Temp 99.0°F | Resp 16 | Wt 196.8 lb

## 2014-10-01 DIAGNOSIS — R21 Rash and other nonspecific skin eruption: Secondary | ICD-10-CM | POA: Insufficient documentation

## 2014-10-01 DIAGNOSIS — Z7951 Long term (current) use of inhaled steroids: Secondary | ICD-10-CM | POA: Insufficient documentation

## 2014-10-01 DIAGNOSIS — Z7982 Long term (current) use of aspirin: Secondary | ICD-10-CM | POA: Insufficient documentation

## 2014-10-01 DIAGNOSIS — E119 Type 2 diabetes mellitus without complications: Secondary | ICD-10-CM | POA: Insufficient documentation

## 2014-10-01 DIAGNOSIS — I509 Heart failure, unspecified: Secondary | ICD-10-CM | POA: Insufficient documentation

## 2014-10-01 DIAGNOSIS — E785 Hyperlipidemia, unspecified: Secondary | ICD-10-CM | POA: Insufficient documentation

## 2014-10-01 DIAGNOSIS — I10 Essential (primary) hypertension: Secondary | ICD-10-CM | POA: Insufficient documentation

## 2014-10-01 DIAGNOSIS — E139 Other specified diabetes mellitus without complications: Secondary | ICD-10-CM

## 2014-10-01 DIAGNOSIS — E039 Hypothyroidism, unspecified: Secondary | ICD-10-CM | POA: Insufficient documentation

## 2014-10-01 DIAGNOSIS — Z9049 Acquired absence of other specified parts of digestive tract: Secondary | ICD-10-CM | POA: Insufficient documentation

## 2014-10-01 LAB — COMPLETE METABOLIC PANEL WITH GFR
ALK PHOS: 46 U/L (ref 39–117)
ALT: 11 U/L (ref 0–35)
AST: 15 U/L (ref 0–37)
Albumin: 3.9 g/dL (ref 3.5–5.2)
BILIRUBIN TOTAL: 0.4 mg/dL (ref 0.2–1.2)
BUN: 16 mg/dL (ref 6–23)
CO2: 26 meq/L (ref 19–32)
CREATININE: 0.97 mg/dL (ref 0.50–1.10)
Calcium: 9.5 mg/dL (ref 8.4–10.5)
Chloride: 103 mEq/L (ref 96–112)
GFR, Est African American: 81 mL/min
GFR, Est Non African American: 70 mL/min
Glucose, Bld: 103 mg/dL — ABNORMAL HIGH (ref 70–99)
Potassium: 5.1 mEq/L (ref 3.5–5.3)
SODIUM: 141 meq/L (ref 135–145)
TOTAL PROTEIN: 7.2 g/dL (ref 6.0–8.3)

## 2014-10-01 LAB — GLUCOSE, POCT (MANUAL RESULT ENTRY): POC Glucose: 102 mg/dl — AB (ref 70–99)

## 2014-10-01 LAB — POCT GLYCOSYLATED HEMOGLOBIN (HGB A1C): Hemoglobin A1C: 6.2

## 2014-10-01 LAB — TSH: TSH: 1.359 u[IU]/mL (ref 0.350–4.500)

## 2014-10-01 MED ORDER — TRIAMCINOLONE ACETONIDE 0.1 % EX CREA: 1.0000 "application " | TOPICAL_CREAM | Freq: Two times a day (BID) | CUTANEOUS | Status: DC

## 2014-10-01 MED ORDER — CARVEDILOL 12.5 MG PO TABS: 12.5000 mg | ORAL_TABLET | Freq: Two times a day (BID) | ORAL | Status: DC

## 2014-10-01 MED ORDER — PRAVASTATIN SODIUM 10 MG PO TABS: 10.0000 mg | ORAL_TABLET | Freq: Every day | ORAL | Status: DC

## 2014-10-01 MED ORDER — LEVOTHYROXINE SODIUM 50 MCG PO TABS: 50.0000 ug | ORAL_TABLET | Freq: Every day | ORAL | Status: DC

## 2014-10-01 MED ORDER — METFORMIN HCL 500 MG PO TABS: 500.0000 mg | ORAL_TABLET | Freq: Two times a day (BID) | ORAL | Status: DC

## 2014-10-01 MED ORDER — AMLODIPINE BESYLATE 10 MG PO TABS: 10.0000 mg | ORAL_TABLET | Freq: Every day | ORAL | Status: DC

## 2014-10-01 MED ORDER — LISINOPRIL 10 MG PO TABS: 10.0000 mg | ORAL_TABLET | Freq: Every day | ORAL | Status: DC

## 2014-10-01 NOTE — Progress Notes (Signed)
Patient here for follow up Patient is concerned about a dry patch to the left side of her mouth Patient states in the past has been prescribed a cream for this Patient also concerned that with her thyroid disease at times she is having trouble swallowing Patient also requesting refills on her medications

## 2014-10-01 NOTE — Patient Instructions (Signed)
DASH Eating Plan DASH stands for "Dietary Approaches to Stop Hypertension." The DASH eating plan is a healthy eating plan that has been shown to reduce high blood pressure (hypertension). Additional health benefits may include reducing the risk of type 2 diabetes mellitus, heart disease, and stroke. The DASH eating plan may also help with weight loss. WHAT DO I NEED TO KNOW ABOUT THE DASH EATING PLAN? For the DASH eating plan, you will follow these general guidelines:  Choose foods with a percent daily value for sodium of less than 5% (as listed on the food label).  Use salt-free seasonings or herbs instead of table salt or sea salt.  Check with your health care provider or pharmacist before using salt substitutes.  Eat lower-sodium products, often labeled as "lower sodium" or "no salt added."  Eat fresh foods.  Eat more vegetables, fruits, and low-fat dairy products.  Choose whole grains. Look for the word "whole" as the first word in the ingredient list.  Choose fish and skinless chicken or turkey more often than red meat. Limit fish, poultry, and meat to 6 oz (170 g) each day.  Limit sweets, desserts, sugars, and sugary drinks.  Choose heart-healthy fats.  Limit cheese to 1 oz (28 g) per day.  Eat more home-cooked food and less restaurant, buffet, and fast food.  Limit fried foods.  Cook foods using methods other than frying.  Limit canned vegetables. If you do use them, rinse them well to decrease the sodium.  When eating at a restaurant, ask that your food be prepared with less salt, or no salt if possible. WHAT FOODS CAN I EAT? Seek help from a dietitian for individual calorie needs. Grains Whole grain or whole wheat bread. Brown rice. Whole grain or whole wheat pasta. Quinoa, bulgur, and whole grain cereals. Low-sodium cereals. Corn or whole wheat flour tortillas. Whole grain cornbread. Whole grain crackers. Low-sodium crackers. Vegetables Fresh or frozen vegetables  (raw, steamed, roasted, or grilled). Low-sodium or reduced-sodium tomato and vegetable juices. Low-sodium or reduced-sodium tomato sauce and paste. Low-sodium or reduced-sodium canned vegetables.  Fruits All fresh, canned (in natural juice), or frozen fruits. Meat and Other Protein Products Ground beef (85% or leaner), grass-fed beef, or beef trimmed of fat. Skinless chicken or turkey. Ground chicken or turkey. Pork trimmed of fat. All fish and seafood. Eggs. Dried beans, peas, or lentils. Unsalted nuts and seeds. Unsalted canned beans. Dairy Low-fat dairy products, such as skim or 1% milk, 2% or reduced-fat cheeses, low-fat ricotta or cottage cheese, or plain low-fat yogurt. Low-sodium or reduced-sodium cheeses. Fats and Oils Tub margarines without trans fats. Light or reduced-fat mayonnaise and salad dressings (reduced sodium). Avocado. Safflower, olive, or canola oils. Natural peanut or almond butter. Other Unsalted popcorn and pretzels. The items listed above may not be a complete list of recommended foods or beverages. Contact your dietitian for more options. WHAT FOODS ARE NOT RECOMMENDED? Grains White bread. White pasta. White rice. Refined cornbread. Bagels and croissants. Crackers that contain trans fat. Vegetables Creamed or fried vegetables. Vegetables in a cheese sauce. Regular canned vegetables. Regular canned tomato sauce and paste. Regular tomato and vegetable juices. Fruits Dried fruits. Canned fruit in light or heavy syrup. Fruit juice. Meat and Other Protein Products Fatty cuts of meat. Ribs, chicken wings, bacon, sausage, bologna, salami, chitterlings, fatback, hot dogs, bratwurst, and packaged luncheon meats. Salted nuts and seeds. Canned beans with salt. Dairy Whole or 2% milk, cream, half-and-half, and cream cheese. Whole-fat or sweetened yogurt. Full-fat   cheeses or blue cheese. Nondairy creamers and whipped toppings. Processed cheese, cheese spreads, or cheese  curds. Condiments Onion and garlic salt, seasoned salt, table salt, and sea salt. Canned and packaged gravies. Worcestershire sauce. Tartar sauce. Barbecue sauce. Teriyaki sauce. Soy sauce, including reduced sodium. Steak sauce. Fish sauce. Oyster sauce. Cocktail sauce. Horseradish. Ketchup and mustard. Meat flavorings and tenderizers. Bouillon cubes. Hot sauce. Tabasco sauce. Marinades. Taco seasonings. Relishes. Fats and Oils Butter, stick margarine, lard, shortening, ghee, and bacon fat. Coconut, palm kernel, or palm oils. Regular salad dressings. Other Pickles and olives. Salted popcorn and pretzels. The items listed above may not be a complete list of foods and beverages to avoid. Contact your dietitian for more information. WHERE CAN I FIND MORE INFORMATION? National Heart, [employer], [employer]: www.nhlbi.nih.gov/health/health-topics/topics/dash/ Document Released: 04/27/2011 Document Revised: 09/22/2013 Document Reviewed: 03/12/2013 ExitCare Patient Information 2015 [employer]. This information is not intended to replace advice given to you by your health care provider. Make sure you discuss any questions you have with your health care provider. Diabetes Mellitus and Food It is important for you to manage your blood sugar (glucose) level. Your blood glucose level can be greatly affected by what you eat. Eating healthier foods in the appropriate amounts throughout the day at about the same time each day will help you control your blood glucose level. It can also help slow or prevent worsening of your diabetes mellitus. Healthy eating may even help you improve the level of your blood pressure and reach or maintain a healthy weight.  HOW CAN FOOD AFFECT ME? Carbohydrates Carbohydrates affect your blood glucose level more than any other type of food. Your dietitian will help you determine how many carbohydrates to eat at each meal and teach you how to count carbohydrates. Counting  carbohydrates is important to keep your blood glucose at a healthy level, especially if you are using insulin or taking certain medicines for diabetes mellitus. Alcohol Alcohol can cause sudden decreases in blood glucose (hypoglycemia), especially if you use insulin or take certain medicines for diabetes mellitus. Hypoglycemia can be a life-threatening condition. Symptoms of hypoglycemia (sleepiness, dizziness, and disorientation) are similar to symptoms of having too much alcohol.  If your health care provider has given you approval to drink alcohol, do so in moderation and use the following guidelines:  Women should not have more than one drink per day, and men should not have more than two drinks per day. One drink is equal to:  12 oz of beer.  5 oz of wine.  1 oz of hard liquor.  Do not drink on an empty stomach.  Keep yourself hydrated. Have water, diet soda, or unsweetened iced tea.  Regular soda, juice, and other mixers might contain a lot of carbohydrates and should be counted. WHAT FOODS ARE NOT RECOMMENDED? As you make food choices, it is important to remember that all foods are not the same. Some foods have fewer nutrients per serving than other foods, even though they might have the same number of calories or carbohydrates. It is difficult to get your body what it needs when you eat foods with fewer nutrients. Examples of foods that you should avoid that are high in calories and carbohydrates but low in nutrients include:  Trans fats (most processed foods list trans fats on the Nutrition Facts label).  Regular soda.  Juice.  Candy.  Sweets, such as cake, pie, doughnuts, and cookies.  Fried foods. WHAT FOODS CAN I EAT? Have nutrient-rich foods,   which will nourish your body and keep you healthy. The food you should eat also will depend on several factors, including:  The calories you need.  The medicines you take.  Your weight.  Your blood glucose level.  Your  blood pressure level.  Your cholesterol level. You also should eat a variety of foods, including:  Protein, such as meat, poultry, fish, tofu, nuts, and seeds (lean animal proteins are best).  Fruits.  Vegetables.  Dairy products, such as milk, cheese, and yogurt (low fat is best).  Breads, grains, pasta, cereal, rice, and beans.  Fats such as olive oil, trans fat-free margarine, canola oil, avocado, and olives. DOES EVERYONE WITH DIABETES MELLITUS HAVE THE SAME MEAL PLAN? Because every person with diabetes mellitus is different, there is not one meal plan that works for everyone. It is very important that you meet with a dietitian who will help you create a meal plan that is just right for you. Document Released: 02/02/2005 Document Revised: 05/13/2013 Document Reviewed: 04/04/2013 ExitCare Patient Information 2015 [employer]. This information is not intended to replace advice given to you by your health care provider. Make sure you discuss any questions you have with your health care provider.  

## 2014-10-01 NOTE — Progress Notes (Signed)
MRN: [id number] Name: Linda Phelps  Sex: female Age: 44 y.o. DOB: 08/24/1970  Allergies: Review of patient's allergies indicates no known allergies.  Chief Complaint  Patient presents with  . Follow-up    HPI: Patient is 44 y.o. female who has history of diabetes hypertension, hyperlipidemia, hypothyroidism, CHF comes today for followup requesting refill on her medication, also complaining of noticing a rash on the side of her face, she recalls whenever she eats tomatoes she tends to develop this rash,  She denies any breathing problems, does complain of some throat fullness denies any odynophagia or sore throat, patient denies any chest pain shortness of breath.patient is requesting refill on her medications.  Past Medical History  Diagnosis Date  . CHF (congestive heart failure)   . Hypertension   . Obesity   . Diabetes mellitus without complication   . High cholesterol   . Ectopic pregnancy     Past Surgical History  Procedure Laterality Date  . Cholecystectomy    . Appendectomy        Medication List       This list is accurate as of: 10/01/14 11:49 AM.  Always use your most recent med list.               amLODipine 10 MG tablet  Commonly known as:  NORVASC  Take 1 tablet (10 mg total) by mouth daily.     aspirin 81 MG tablet  Take 81 mg by mouth daily.     azithromycin 250 MG tablet  Commonly known as:  ZITHROMAX Z-PAK  Take as directed     carvedilol 12.5 MG tablet  Commonly known as:  COREG  Take 1 tablet (12.5 mg total) by mouth 2 (two) times daily with a meal.     fluticasone 50 MCG/ACT nasal spray  Commonly known as:  FLONASE  Place 2 sprays into both nostrils daily.     furosemide 20 MG tablet  Commonly known as:  LASIX  Take 1 tablet (20 mg total) by mouth 2 (two) times daily.     levothyroxine 50 MCG tablet  Commonly known as:  SYNTHROID, LEVOTHROID  Take 1 tablet (50 mcg total) by mouth daily before breakfast.     lisinopril 10 MG  tablet  Commonly known as:  PRINIVIL,ZESTRIL  Take 1 tablet (10 mg total) by mouth daily.     metFORMIN 500 MG tablet  Commonly known as:  GLUCOPHAGE  Take 1 tablet (500 mg total) by mouth 2 (two) times daily with a meal.     miconazole 2 % cream  Commonly known as:  MICOTIN  Apply 1 application topically 2 (two) times daily. To hands and feet     pravastatin 10 MG tablet  Commonly known as:  PRAVACHOL  Take 1 tablet (10 mg total) by mouth daily.     triamcinolone cream 0.1 %  Commonly known as:  KENALOG  Apply 1 application topically 2 (two) times daily.     Vitamin D (Ergocalciferol) 50000 UNITS Caps capsule  Commonly known as:  DRISDOL  Take 1 capsule (50,000 Units total) by mouth every 7 (seven) days.        Meds ordered this encounter  Medications  . amLODipine (NORVASC) 10 MG tablet    Sig: Take 1 tablet (10 mg total) by mouth daily.    Dispense:  30 tablet    Refill:  3  . carvedilol (COREG) 12.5 MG tablet    Sig: Take 1  tablet (12.5 mg total) by mouth 2 (two) times daily with a meal.    Dispense:  60 tablet    Refill:  3  . levothyroxine (SYNTHROID, LEVOTHROID) 50 MCG tablet    Sig: Take 1 tablet (50 mcg total) by mouth daily before breakfast.    Dispense:  30 tablet    Refill:  3  . lisinopril (PRINIVIL,ZESTRIL) 10 MG tablet    Sig: Take 1 tablet (10 mg total) by mouth daily.    Dispense:  30 tablet    Refill:  3  . metFORMIN (GLUCOPHAGE) 500 MG tablet    Sig: Take 1 tablet (500 mg total) by mouth 2 (two) times daily with a meal.    Dispense:  60 tablet    Refill:  3  . pravastatin (PRAVACHOL) 10 MG tablet    Sig: Take 1 tablet (10 mg total) by mouth daily.    Dispense:  30 tablet    Refill:  3  . triamcinolone cream (KENALOG) 0.1 %    Sig: Apply 1 application topically 2 (two) times daily.    Dispense:  30 g    Refill:  1    Immunization History  Administered Date(s) Administered  . Influenza,inj,Quad PF,36+ Mos 07/03/2014    Family History    Problem Relation Age of Onset  . Diabetes Mother   . Heart disease Mother   . Diabetes Maternal Grandmother     History  Substance Use Topics  . Smoking status: Never Smoker   . Smokeless tobacco: Not on file  . Alcohol Use: 0.0 oz/week    0 Standard drinks or equivalent per week     Comment: occ    Review of Systems   As noted in HPI  Filed Vitals:   10/01/14 1034  BP: 130/80  Pulse:   Temp:   Resp:     Physical Exam  Physical Exam  Constitutional: No distress.  HENT:  No pharyngeal erythema or exudate  Eyes: EOM are normal. Pupils are equal, round, and reactive to light.  Cardiovascular: Normal rate and regular rhythm.   Pulmonary/Chest: Breath sounds normal. No respiratory distress. She has no wheezes. She has no rales.  Musculoskeletal: She exhibits no edema.    CBC    Component Value Date/Time   WBC 5.5 07/03/2014 1005   RBC 4.12 07/03/2014 1005   HGB 12.0 07/03/2014 1005   HCT 37.2 07/03/2014 1005   PLT 293 07/03/2014 1005   MCV 90.3 07/03/2014 1005   LYMPHSABS 1.9 07/03/2014 1005   MONOABS 0.7 07/03/2014 1005   EOSABS 0.2 07/03/2014 1005   BASOSABS 0.0 07/03/2014 1005    CMP     Component Value Date/Time   NA 142 07/03/2014 1005   K 5.3 07/03/2014 1005   CL 105 07/03/2014 1005   CO2 27 07/03/2014 1005   GLUCOSE 106* 07/03/2014 1005   BUN 18 07/03/2014 1005   CREATININE 0.81 07/03/2014 1005   CREATININE 0.90 03/08/2014 1530   CALCIUM 9.3 07/03/2014 1005   PROT 7.0 07/03/2014 1005   ALBUMIN 3.6 07/03/2014 1005   AST 17 07/03/2014 1005   ALT 13 07/03/2014 1005   ALKPHOS 54 07/03/2014 1005   BILITOT 0.4 07/03/2014 1005   GFRNONAA 88 07/03/2014 1005   GFRNONAA 76* 03/08/2014 1530   GFRAA >89 07/03/2014 1005   GFRAA 88* 03/08/2014 1530    No results found for: CHOL  Lab Results  Component Value Date/Time   HGBA1C 6.20 10/01/2014 10:01 AM  Lab Results  Component Value Date/Time   AST 17 07/03/2014 10:05 AM    Assessment  and Plan  Other specified diabetes mellitus without complications - Plan:  Results for orders placed or performed in visit on 10/01/14  Glucose (CBG)  Result Value Ref Range   POC Glucose 102.0 (A) 70 - 99 mg/dl  HgB A1c  Result Value Ref Range   Hemoglobin A1C 6.20    Diabetes is well controlled continue with current meds, repeat A1c in 3 months  HgB A1c, metFORMIN (GLUCOPHAGE) 500 MG tablet  Essential hypertension - Plan: amLODipine (NORVASC) 10 MG tablet, carvedilol (COREG) 12.5 MG tablet, lisinopril (PRINIVIL,ZESTRIL) 10 MG tablet, COMPLETE METABOLIC PANEL WITH GFR  Congestive heart failure, unspecified congestive heart failure chronicity, unspecified congestive heart failure type - Plan: carvedilol (COREG) 12.5 MG tablet  Hypothyroidism, unspecified hypothyroidism type - Plan: currently patient is on levothyroxine (SYNTHROID, LEVOTHROID) 50 MCG tablet, repeat TSHl evel  Hyperlipidemia - Plan: pravastatin (PRAVACHOL) 10 MG tablet, we'll check fasting lipid panel on the next visit.  Rash and nonspecific skin eruption Prescribed topical Kenalog  Health Maintenance  -Pap Smear:will be scheduled -Mammogram:already scheduled   Return in about 3 months (around 01/01/2015) for diabetes, hypertension, Schedule Appt with Dr Burman Freestone for PAP.   This note has been created with Surveyor, quantity. Any transcriptional errors are unintentional.    Lorayne Marek, MD

## 2014-10-02 ENCOUNTER — Ambulatory Visit (HOSPITAL_COMMUNITY): Payer: Self-pay

## 2014-10-07 ENCOUNTER — Telehealth: Payer: Self-pay | Admitting: Internal Medicine

## 2014-10-07 NOTE — Telephone Encounter (Signed)
Patient is calling to request blood work results, please f/u with pt.

## 2014-10-09 ENCOUNTER — Ambulatory Visit (HOSPITAL_COMMUNITY): Payer: Self-pay

## 2014-10-09 ENCOUNTER — Ambulatory Visit (HOSPITAL_BASED_OUTPATIENT_CLINIC_OR_DEPARTMENT_OTHER): Payer: Self-pay

## 2014-10-14 ENCOUNTER — Ambulatory Visit: Payer: Self-pay | Admitting: Cardiology

## 2014-10-14 ENCOUNTER — Telehealth: Payer: Self-pay | Admitting: Internal Medicine

## 2014-10-26 ENCOUNTER — Ambulatory Visit: Payer: Self-pay

## 2014-10-27 ENCOUNTER — Ambulatory Visit (HOSPITAL_COMMUNITY): Admission: RE | Admit: 2014-10-27 | Payer: Self-pay | Source: Ambulatory Visit

## 2014-11-10 ENCOUNTER — Other Ambulatory Visit: Payer: Self-pay | Admitting: Internal Medicine

## 2014-11-18 ENCOUNTER — Telehealth: Payer: Self-pay

## 2014-11-18 NOTE — Telephone Encounter (Signed)
Tried to call patient to give lab results Home number is not a working number

## 2014-11-20 ENCOUNTER — Ambulatory Visit: Payer: Self-pay

## 2014-11-24 ENCOUNTER — Ambulatory Visit (HOSPITAL_COMMUNITY): Admission: RE | Admit: 2014-11-24 | Payer: Self-pay | Source: Ambulatory Visit

## 2014-11-25 ENCOUNTER — Telehealth: Payer: Self-pay

## 2014-11-25 DIAGNOSIS — I10 Essential (primary) hypertension: Secondary | ICD-10-CM

## 2014-11-25 DIAGNOSIS — I509 Heart failure, unspecified: Secondary | ICD-10-CM

## 2014-11-25 MED ORDER — FUROSEMIDE 20 MG PO TABS: 20.0000 mg | ORAL_TABLET | Freq: Two times a day (BID) | ORAL | Status: DC

## 2014-11-25 NOTE — Telephone Encounter (Signed)
Patient called requesting a refill on her lasix [facility]

## 2014-12-21 ENCOUNTER — Ambulatory Visit: Payer: Self-pay | Admitting: Internal Medicine

## 2014-12-21 ENCOUNTER — Ambulatory Visit: Payer: Self-pay

## 2014-12-25 ENCOUNTER — Ambulatory Visit: Payer: Self-pay | Admitting: Internal Medicine

## 2015-04-13 ENCOUNTER — Ambulatory Visit: Payer: Self-pay | Attending: Family Medicine | Admitting: Family Medicine

## 2015-04-13 ENCOUNTER — Encounter: Payer: Self-pay | Admitting: Family Medicine

## 2015-04-13 VITALS — BP 135/84 | HR 68 | Temp 98.0°F | Resp 16 | Ht 61.0 in | Wt 192.0 lb

## 2015-04-13 DIAGNOSIS — E119 Type 2 diabetes mellitus without complications: Secondary | ICD-10-CM

## 2015-04-13 DIAGNOSIS — I517 Cardiomegaly: Secondary | ICD-10-CM

## 2015-04-13 DIAGNOSIS — E785 Hyperlipidemia, unspecified: Secondary | ICD-10-CM

## 2015-04-13 DIAGNOSIS — K089 Disorder of teeth and supporting structures, unspecified: Secondary | ICD-10-CM

## 2015-04-13 DIAGNOSIS — Z7982 Long term (current) use of aspirin: Secondary | ICD-10-CM | POA: Insufficient documentation

## 2015-04-13 DIAGNOSIS — I10 Essential (primary) hypertension: Secondary | ICD-10-CM

## 2015-04-13 DIAGNOSIS — B353 Tinea pedis: Secondary | ICD-10-CM

## 2015-04-13 DIAGNOSIS — Z79899 Other long term (current) drug therapy: Secondary | ICD-10-CM | POA: Insufficient documentation

## 2015-04-13 DIAGNOSIS — Z7984 Long term (current) use of oral hypoglycemic drugs: Secondary | ICD-10-CM | POA: Insufficient documentation

## 2015-04-13 DIAGNOSIS — Z0000 Encounter for general adult medical examination without abnormal findings: Secondary | ICD-10-CM

## 2015-04-13 DIAGNOSIS — E039 Hypothyroidism, unspecified: Secondary | ICD-10-CM

## 2015-04-13 HISTORY — DX: Disorder of teeth and supporting structures, unspecified: K08.9

## 2015-04-13 LAB — BASIC METABOLIC PANEL
BUN: 15 mg/dL (ref 7–25)
CHLORIDE: 104 mmol/L (ref 98–110)
CO2: 29 mmol/L (ref 20–31)
CREATININE: 0.85 mg/dL (ref 0.50–1.10)
Calcium: 9.2 mg/dL (ref 8.6–10.2)
Glucose, Bld: 93 mg/dL (ref 65–99)
Potassium: 4.8 mmol/L (ref 3.5–5.3)
SODIUM: 140 mmol/L (ref 135–146)

## 2015-04-13 LAB — TSH: TSH: 1.098 u[IU]/mL (ref 0.350–4.500)

## 2015-04-13 LAB — GLUCOSE, POCT (MANUAL RESULT ENTRY): POC GLUCOSE: 111 mg/dL — AB (ref 70–99)

## 2015-04-13 LAB — POCT GLYCOSYLATED HEMOGLOBIN (HGB A1C): HEMOGLOBIN A1C: 6.3

## 2015-04-13 MED ORDER — AMLODIPINE BESYLATE 10 MG PO TABS: 10.0000 mg | ORAL_TABLET | Freq: Every day | ORAL | Status: DC

## 2015-04-13 MED ORDER — PRAVASTATIN SODIUM 10 MG PO TABS: 10.0000 mg | ORAL_TABLET | Freq: Every day | ORAL | Status: DC

## 2015-04-13 MED ORDER — CARVEDILOL 12.5 MG PO TABS: 12.5000 mg | ORAL_TABLET | Freq: Two times a day (BID) | ORAL | Status: DC

## 2015-04-13 MED ORDER — METFORMIN HCL 500 MG PO TABS: 500.0000 mg | ORAL_TABLET | Freq: Two times a day (BID) | ORAL | Status: DC

## 2015-04-13 MED ORDER — FUROSEMIDE 20 MG PO TABS: 20.0000 mg | ORAL_TABLET | Freq: Two times a day (BID) | ORAL | Status: DC

## 2015-04-13 MED ORDER — LISINOPRIL 10 MG PO TABS: 10.0000 mg | ORAL_TABLET | Freq: Every day | ORAL | Status: DC

## 2015-04-13 MED ORDER — KETOCONAZOLE 2 % EX CREA: 1.0000 "application " | TOPICAL_CREAM | Freq: Every day | CUTANEOUS | Status: DC

## 2015-04-13 NOTE — Assessment & Plan Note (Signed)
A: diabetes remains controlled P: Continue metformin Patient to apply for orange card, I will then place opthalmology referral

## 2015-04-13 NOTE — Patient Instructions (Addendum)
Linda Phelps was seen today for diabetes.  Diagnoses and all orders for this visit:  Controlled type 2 diabetes mellitus without complication, without long-term current use of insulin (HCC) -     POCT glycosylated hemoglobin (Hb A1C) -     POCT glucose (manual entry) -     Microalbumin/Creatinine Ratio, Urine -     metFORMIN (GLUCOPHAGE) 500 MG tablet; Take 1 tablet (500 mg total) by mouth 2 (two) times daily with a meal.  Hypothyroidism, unspecified hypothyroidism type -     TSH  Essential hypertension -     Basic Metabolic Panel -     amLODipine (NORVASC) 10 MG tablet; Take 1 tablet (10 mg total) by mouth daily. -     carvedilol (COREG) 12.5 MG tablet; Take 1 tablet (12.5 mg total) by mouth 2 (two) times daily with a meal. -     furosemide (LASIX) 20 MG tablet; Take 1 tablet (20 mg total) by mouth 2 (two) times daily. -     lisinopril (PRINIVIL,ZESTRIL) 10 MG tablet; Take 1 tablet (10 mg total) by mouth daily.  Hyperlipidemia -     pravastatin (PRAVACHOL) 10 MG tablet; Take 1 tablet (10 mg total) by mouth daily.  Tinea pedis of both feet -     ketoconazole (NIZORAL) 2 % cream; Apply 1 application topically daily.  Please apply for [place] discount and orange card, you can also inquire if any of your medications are on the PASS (medications assistance) list.   Once you have orange card/[place] discount will place the following:  Dental referral, opthalmology referral and ECHO.  If you do not have congestive heart failure will plan to titrate you off of coreg and lasix. Plan to use norvasc and lisinopril for BP control.   F/u in 6-8 weeks for pap smear   F/u in 6 months for diabetes and HTN, sooner if needed  Dr. Adrian Blackwater

## 2015-04-13 NOTE — Assessment & Plan Note (Signed)
Antifungal cream prescribed.

## 2015-04-13 NOTE — Assessment & Plan Note (Addendum)
A: persistent cardiomegaly on CXR. No s/s of CHF P: ECHO once she has orange card If no CHF taper off lasix, possibly taper off coreg as well  Tight BP control

## 2015-04-13 NOTE — Assessment & Plan Note (Signed)
A: compliant with synthroid P: Check TSH today

## 2015-04-13 NOTE — Progress Notes (Signed)
F/ U  DM Glucose running 105-130 Taking medication as prescribed  No tobacco user  No suicide thought in the past two weeks

## 2015-04-13 NOTE — Assessment & Plan Note (Signed)
A: BP well controlled Med: compliant P: continue current regimen

## 2015-04-13 NOTE — Assessment & Plan Note (Signed)
Dental referral once patient has orange card

## 2015-04-13 NOTE — Progress Notes (Signed)
Subjective:  Patient ID: Linda Phelps, female    DOB: 1970/12/16  Age: 44 y.o. MRN: [id number]  CC: Diabetes   HPI Linda Phelps presents for   1. CHRONIC DIABETES  Disease Monitoring  Blood Sugar Ranges: not checking   Polyuria: no   Visual problems: yes, having to wear reading glasses   Medication Compliance: yes  Medication Side Effects  Hypoglycemia: no   Preventitive Health Care  Eye Exam: due   Foot Exam: done today   Diet pattern: working on low carb   Exercise: minimal  2. HTN: taking medications. No swelling. ? CHF. Unable to have ECHO done due to lack of insurance. Has some CP that is non-exertional, substernal, burning. No SOB.  3. Dry skin: on legs and feet. Some itching on feet. Toenails are thick and yellowed.   4. Hypothyroidism:taking synthroid. Admits to fatigue. Working to lose weight. No peripheral edema.   Social History  Substance Use Topics  . Smoking status: Never Smoker   . Smokeless tobacco: Not on file  . Alcohol Use: 0.0 oz/week    0 Standard drinks or equivalent per week     Comment: occ    Outpatient Prescriptions Prior to Visit  Medication Sig Dispense Refill  . amLODipine (NORVASC) 10 MG tablet Take 1 tablet (10 mg total) by mouth daily. 30 tablet 3  . aspirin 81 MG tablet Take 81 mg by mouth daily.    Marland Kitchen azithromycin (ZITHROMAX Z-PAK) 250 MG tablet Take as directed 6 each 0  . carvedilol (COREG) 12.5 MG tablet Take 1 tablet (12.5 mg total) by mouth 2 (two) times daily with a meal. 60 tablet 3  . fluticasone (FLONASE) 50 MCG/ACT nasal spray Place 2 sprays into both nostrils daily. 16 g 6  . furosemide (LASIX) 20 MG tablet Take 1 tablet (20 mg total) by mouth 2 (two) times daily. 60 tablet 3  . levothyroxine (SYNTHROID, LEVOTHROID) 50 MCG tablet Take 1 tablet (50 mcg total) by mouth daily before breakfast. 30 tablet 3  . lisinopril (PRINIVIL,ZESTRIL) 10 MG tablet Take 1 tablet (10 mg total) by mouth daily. 30 tablet 3  . metFORMIN  (GLUCOPHAGE) 500 MG tablet Take 1 tablet (500 mg total) by mouth 2 (two) times daily with a meal. 60 tablet 3  . miconazole (MICOTIN) 2 % cream Apply 1 application topically 2 (two) times daily. To hands and feet (Patient not taking: Reported on 09/23/2014) 56.7 g 1  . pravastatin (PRAVACHOL) 10 MG tablet Take 1 tablet (10 mg total) by mouth daily. 30 tablet 3  . triamcinolone cream (KENALOG) 0.1 % Apply 1 application topically 2 (two) times daily. 30 g 1  . Vitamin D, Ergocalciferol, (DRISDOL) 50000 UNITS CAPS capsule Take 1 capsule (50,000 Units total) by mouth every 7 (seven) days. 12 capsule 0   No facility-administered medications prior to visit.    ROS Review of Systems  Constitutional: Positive for fatigue. Negative for fever and chills.  HENT: Positive for dental problem.   Eyes: Negative for visual disturbance.  Respiratory: Negative for shortness of breath.   Cardiovascular: Positive for chest pain.  Gastrointestinal: Negative for abdominal pain and blood in stool.  Musculoskeletal: Negative for back pain and arthralgias.  Skin: Negative for rash.  Allergic/Immunologic: Negative for immunocompromised state.  Hematological: Negative for adenopathy. Does not bruise/bleed easily.  Psychiatric/Behavioral: Negative for suicidal ideas and dysphoric mood.    Objective:  BP 135/84 mmHg  Pulse 68  Temp(Src) 98 F (36.7 C) (Oral)  Resp 16  Ht 5\' 1"  (1.549 m)  Wt 192 lb (87.091 kg)  BMI 36.30 kg/m2  SpO2 99%  BP/Weight 04/13/2015 Q000111Q [id number]  Systolic BP [id number] [id number]  Diastolic BP 84 80 83  Wt. (Lbs) 192 196.85 197  BMI 36.3 37.21 37.24   Physical Exam  Constitutional: She is oriented to person, place, and time. She appears well-developed and well-nourished. No distress.  HENT:  Head: Normocephalic and atraumatic.  Cardiovascular: Normal rate, regular rhythm, normal heart sounds and intact distal pulses.   Pulmonary/Chest: Effort normal and breath sounds normal.    Musculoskeletal: She exhibits no edema.  Neurological: She is alert and oriented to person, place, and time.  Skin: Skin is warm and dry. No rash noted.  Thickened yellow-brown debris between toes  Psychiatric: She has a normal mood and affect.   Lab Results  Component Value Date   HGBA1C 6.20 10/01/2014   Lab Results  Component Value Date   HGBA1C 6.30 04/13/2015    CBG 111 Assessment & Plan:   Problem List Items Addressed This Visit    Diabetes type 2, controlled ([place]) - Primary (Chronic)   Relevant Medications   lisinopril (PRINIVIL,ZESTRIL) 10 MG tablet   metFORMIN (GLUCOPHAGE) 500 MG tablet   pravastatin (PRAVACHOL) 10 MG tablet   Other Relevant Orders   POCT glycosylated hemoglobin (Hb A1C) (Completed)   POCT glucose (manual entry) (Completed)   Microalbumin/Creatinine Ratio, Urine   Essential hypertension (Chronic)   Relevant Medications   amLODipine (NORVASC) 10 MG tablet   carvedilol (COREG) 12.5 MG tablet   furosemide (LASIX) 20 MG tablet   lisinopril (PRINIVIL,ZESTRIL) 10 MG tablet   pravastatin (PRAVACHOL) 10 MG tablet   Other Relevant Orders   Basic Metabolic Panel   Hyperlipidemia (Chronic)   Relevant Medications   amLODipine (NORVASC) 10 MG tablet   carvedilol (COREG) 12.5 MG tablet   furosemide (LASIX) 20 MG tablet   lisinopril (PRINIVIL,ZESTRIL) 10 MG tablet   pravastatin (PRAVACHOL) 10 MG tablet   Hypothyroidism (Chronic)   Relevant Medications   carvedilol (COREG) 12.5 MG tablet   Other Relevant Orders   TSH   Tinea pedis   Relevant Medications   ketoconazole (NIZORAL) 2 % cream    Other Visit Diagnoses    Healthcare maintenance        Relevant Orders    Flu Vaccine QUAD 36+ mos IM (Completed)       No orders of the defined types were placed in this encounter.    Follow-up: No Follow-up on file.   Boykin Nearing MD

## 2015-04-14 LAB — MICROALBUMIN / CREATININE URINE RATIO
CREATININE, URINE: 34 mg/dL (ref 20–320)
Microalb Creat Ratio: 518 mcg/mg creat — ABNORMAL HIGH (ref <30)
Microalb, Ur: 17.6 mg/dL

## 2015-04-14 MED ORDER — LISINOPRIL 20 MG PO TABS: 20.0000 mg | ORAL_TABLET | Freq: Every day | ORAL | Status: DC

## 2015-04-14 NOTE — Addendum Note (Signed)
Addended by: Boykin Nearing on: 04/14/2015 09:08 AM   Modules accepted: Orders

## 2015-05-03 ENCOUNTER — Telehealth: Payer: Self-pay | Admitting: *Deleted

## 2015-05-03 NOTE — Telephone Encounter (Signed)
Patient returned phone call, please f/u with pt.    °

## 2015-05-03 NOTE — Telephone Encounter (Signed)
LVM to return call.

## 2015-05-03 NOTE — Telephone Encounter (Signed)
-----   Message from Boykin Nearing, MD sent at 04/14/2015  9:07 AM EST ----- Normal TSH and BMP Elevated urine microalbumin, recommend increase lisinopril as tolerated up to 40 mg daily for renal protection. Lisinopril currently at 10 mg daily, I have increased dose to 20 mg daily

## 2015-05-03 NOTE — Telephone Encounter (Signed)
Pt returned call  Date of birth verified by pt  Lab results given Advised to increased Lisinopril from 10 mg to 2 0mg  Pt verbalized understanding

## 2015-05-10 ENCOUNTER — Other Ambulatory Visit: Payer: Self-pay | Admitting: Internal Medicine

## 2015-05-25 MED FILL — PRAVASTATIN SODIUM 10 MG TA: 10 | 30 days supply | Qty: 30 | Fill #1

## 2015-05-25 MED FILL — ?CARVEDILOL 12.5 MG TABLET: 12.5 | 30 days supply | Qty: 60 | Fill #1

## 2015-05-25 MED FILL — ?FUROSEMIDE 20 MG TABLET: 20 | 30 days supply | Qty: 60 | Fill #1

## 2015-05-25 MED FILL — ?AMLODIPINE BESYLATE 10 MG: 10 | 30 days supply | Qty: 30 | Fill #1

## 2015-05-25 MED FILL — ?METFORMIN HCL 500MG TABLET: 500 | 30 days supply | Qty: 60 | Fill #1

## 2015-05-27 ENCOUNTER — Telehealth: Payer: Self-pay | Admitting: Family Medicine

## 2015-05-27 NOTE — Telephone Encounter (Signed)
Left a voicemail requesting a call back to reschedule the appointment made on Jan 16th

## 2015-05-27 NOTE — Telephone Encounter (Signed)
Pt is experiencing acid reflex and frequent heart burn Pt went to hospital and was prescribed zantec. She has been taking zantec for 3 weeks and expresses she is still feeling poorly. Pt states she needs a referral to a cardiologist. I made her an appointment with Dr. Adrian Blackwater on the 16th but pt would like to speak with nurse sooner Pls assist. Thank you

## 2015-06-07 ENCOUNTER — Ambulatory Visit: Payer: Self-pay | Admitting: Family Medicine

## 2015-06-16 ENCOUNTER — Ambulatory Visit: Payer: Self-pay | Admitting: Critical Care Medicine

## 2015-06-21 MED FILL — LISINOPRIL 20 MG TABLET: 20 | 30 days supply | Qty: 30 | Fill #1

## 2015-06-21 MED FILL — ?AMLODIPINE BESYLATE 10 MG: 10 | 30 days supply | Qty: 30 | Fill #2

## 2015-06-21 MED FILL — metFORMIN HCL 500 MG TABS: 500 | 30 days supply | Qty: 60 | Fill #2

## 2015-06-21 MED FILL — PRAVASTATIN SODIUM 10 MG TA: 10 | 30 days supply | Qty: 30 | Fill #2

## 2015-06-21 MED FILL — ?LEVOTHYROXINE 50 MCG TABLE: 50 | 30 days supply | Qty: 30 | Fill #0

## 2015-06-21 MED FILL — ?FUROSEMIDE 20 MG TABLET: 20 | 30 days supply | Qty: 60 | Fill #2

## 2015-07-05 ENCOUNTER — Telehealth: Payer: Self-pay | Admitting: Family Medicine

## 2015-07-05 NOTE — Telephone Encounter (Signed)
Pt. States she is experiencing body aches, cough...she would like an antibiotic to be called in, pt. Stated she does not need an appointment....Marland KitchenMarland Kitchenplease follow up with patient

## 2015-07-06 NOTE — Telephone Encounter (Signed)
Patient will need OV in order to determine which antibiotic is needed. In meantime she is advised to use cough medicine and tylenol for symptom control.

## 2015-07-07 NOTE — Telephone Encounter (Signed)
Lvm to return call   Please schedule appointment with pcp

## 2015-07-19 ENCOUNTER — Ambulatory Visit: Payer: Self-pay | Admitting: Family Medicine

## 2015-07-19 MED FILL — ?LEVOTHYROXINE 50 MCG TABLE: 50 | 30 days supply | Qty: 30 | Fill #1

## 2015-07-19 MED FILL — ?AMLODIPINE BESYLATE 10 MG: 10 | 30 days supply | Qty: 30 | Fill #3

## 2015-07-19 MED FILL — ?METFORMIN HCL 500MG TABLET: 500 | 30 days supply | Qty: 60 | Fill #3

## 2015-07-19 MED FILL — ?FUROSEMIDE 20 MG TABLET: 20 | 30 days supply | Qty: 60 | Fill #3

## 2015-07-19 MED FILL — PRAVASTATIN SODIUM 10 MG TA: 10 | 30 days supply | Qty: 30 | Fill #3

## 2015-07-19 MED FILL — LISINOPRIL 20 MG TABLET: 20 | 30 days supply | Qty: 30 | Fill #2

## 2015-07-26 ENCOUNTER — Other Ambulatory Visit: Payer: Self-pay

## 2015-07-26 ENCOUNTER — Encounter: Payer: Self-pay | Admitting: Family Medicine

## 2015-07-26 ENCOUNTER — Ambulatory Visit (HOSPITAL_BASED_OUTPATIENT_CLINIC_OR_DEPARTMENT_OTHER): Payer: Self-pay | Admitting: Clinical

## 2015-07-26 ENCOUNTER — Ambulatory Visit: Payer: Self-pay | Attending: Family Medicine | Admitting: Family Medicine

## 2015-07-26 VITALS — BP 114/73 | HR 76 | Temp 98.0°F | Resp 16 | Ht 61.0 in | Wt 189.0 lb

## 2015-07-26 DIAGNOSIS — K219 Gastro-esophageal reflux disease without esophagitis: Secondary | ICD-10-CM | POA: Insufficient documentation

## 2015-07-26 DIAGNOSIS — F411 Generalized anxiety disorder: Secondary | ICD-10-CM

## 2015-07-26 DIAGNOSIS — Z0000 Encounter for general adult medical examination without abnormal findings: Secondary | ICD-10-CM

## 2015-07-26 DIAGNOSIS — I10 Essential (primary) hypertension: Secondary | ICD-10-CM | POA: Insufficient documentation

## 2015-07-26 DIAGNOSIS — E119 Type 2 diabetes mellitus without complications: Secondary | ICD-10-CM | POA: Insufficient documentation

## 2015-07-26 DIAGNOSIS — R079 Chest pain, unspecified: Secondary | ICD-10-CM | POA: Insufficient documentation

## 2015-07-26 DIAGNOSIS — Z7982 Long term (current) use of aspirin: Secondary | ICD-10-CM | POA: Insufficient documentation

## 2015-07-26 DIAGNOSIS — E039 Hypothyroidism, unspecified: Secondary | ICD-10-CM | POA: Insufficient documentation

## 2015-07-26 DIAGNOSIS — Z114 Encounter for screening for human immunodeficiency virus [HIV]: Secondary | ICD-10-CM | POA: Insufficient documentation

## 2015-07-26 DIAGNOSIS — R002 Palpitations: Secondary | ICD-10-CM | POA: Insufficient documentation

## 2015-07-26 DIAGNOSIS — Z79899 Other long term (current) drug therapy: Secondary | ICD-10-CM | POA: Insufficient documentation

## 2015-07-26 DIAGNOSIS — E118 Type 2 diabetes mellitus with unspecified complications: Secondary | ICD-10-CM | POA: Insufficient documentation

## 2015-07-26 DIAGNOSIS — E559 Vitamin D deficiency, unspecified: Secondary | ICD-10-CM

## 2015-07-26 DIAGNOSIS — Z7984 Long term (current) use of oral hypoglycemic drugs: Secondary | ICD-10-CM | POA: Insufficient documentation

## 2015-07-26 HISTORY — DX: Palpitations: R00.2

## 2015-07-26 HISTORY — DX: Generalized anxiety disorder: F41.1

## 2015-07-26 HISTORY — DX: Gastro-esophageal reflux disease without esophagitis: K21.9

## 2015-07-26 LAB — BASIC METABOLIC PANEL
BUN: 14 mg/dL (ref 7–25)
CO2: 30 mmol/L (ref 20–31)
Calcium: 9.3 mg/dL (ref 8.6–10.2)
Chloride: 100 mmol/L (ref 98–110)
Creat: 0.91 mg/dL (ref 0.50–1.10)
GLUCOSE: 100 mg/dL — AB (ref 65–99)
POTASSIUM: 5 mmol/L (ref 3.5–5.3)
SODIUM: 140 mmol/L (ref 135–146)

## 2015-07-26 LAB — POCT GLYCOSYLATED HEMOGLOBIN (HGB A1C): HEMOGLOBIN A1C: 6

## 2015-07-26 LAB — TSH: TSH: 0.86 m[IU]/L

## 2015-07-26 LAB — GLUCOSE, POCT (MANUAL RESULT ENTRY)

## 2015-07-26 MED ORDER — HYDROXYZINE HCL 50 MG PO TABS: 50.0000 mg | ORAL_TABLET | Freq: Three times a day (TID) | ORAL | Status: DC | PRN

## 2015-07-26 MED FILL — hydrOXYzine HCL 50 MG TABS: 50 | 10 days supply | Qty: 30 | Fill #0

## 2015-07-26 NOTE — Assessment & Plan Note (Signed)
A; GAD P: Atarax Counseling Stress test to alleviate fears about heart disease GI referral for colonoscopy and possible EGD

## 2015-07-26 NOTE — Progress Notes (Signed)
Subjective:  Patient ID: Linda Phelps, female    DOB: [birth year]  Age: 45 y.o. MRN: [id number]  CC: Hypertension and Diabetes   HPI Linda Phelps presents for   1. CHRONIC DIABETES  Disease Monitoring  Blood Sugar Ranges: not checking   Polyuria: no   Visual problems: decreased near vision   Medication Compliance: yes  Medication Side Effects  Hypoglycemia: no   2. CHRONIC HYPERTENSION  Disease Monitoring  Blood pressure range: not checking   Chest pain: yes   Dyspnea: yes   Claudication: no   Medication compliance: yes  Medication Side Effects  Lightheadedness: yes   Urinary frequency: no   Edema: no    Preventitive Healthcare:  Exercise: yes, walks 2 miles a day     2. Palpitations: for past 6 months.  Patient noticing palpitations at night. She endorses anxiety. She is anxious about her health, she fears that she has heart disease. She fears dying young and leaving her 99 yo daughter. She has a 54 yo son in jail. She is currently unemployed. 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 She endorses panic attacks, she gets SOB, tightness in chest. Attacks last about 15-30 minutes. Coming during the day at night. Occurs most days of the week.   Social History  Substance Use Topics  . Smoking status: Never Smoker   . Smokeless tobacco: Not on file  . Alcohol Use: 0.0 oz/week    0 Standard drinks or equivalent per week     Comment: occ    Outpatient Prescriptions Prior to Visit  Medication Sig Dispense Refill  . amLODipine (NORVASC) 10 MG tablet Take 1 tablet (10 mg total) by mouth daily. 30 tablet 5  . aspirin 81 MG tablet Take 81 mg by mouth daily.    . carvedilol (COREG) 12.5 MG tablet Take 1 tablet (12.5 mg total) by mouth 2 (two) times daily with a meal. 60 tablet 3  . fluticasone (FLONASE) 50 MCG/ACT nasal spray Place 2 sprays into both nostrils daily. 16 g 6  . furosemide (LASIX) 20 MG tablet Take 1 tablet  (20 mg total) by mouth 2 (two) times daily. 60 tablet 3  . levothyroxine (SYNTHROID, LEVOTHROID) 50 MCG tablet TAKE 1 TABLET BY MOUTH DAILY BEFORE BREAKFAST 30 tablet 2  . lisinopril (PRINIVIL,ZESTRIL) 20 MG tablet Take 1 tablet (20 mg total) by mouth daily. 30 tablet 5  . metFORMIN (GLUCOPHAGE) 500 MG tablet Take 1 tablet (500 mg total) by mouth 2 (two) times daily with a meal. 60 tablet 5  . pravastatin (PRAVACHOL) 10 MG tablet Take 1 tablet (10 mg total) by mouth daily. 30 tablet 5  . ketoconazole (NIZORAL) 2 % cream Apply 1 application topically daily. (Patient not taking: Reported on 07/26/2015) 60 g 0   No facility-administered medications prior to visit.    ROS Review of Systems  Constitutional: Positive for fatigue. Negative for fever and chills.  HENT: Positive for dental problem.   Eyes: Negative for visual disturbance.  Respiratory: Negative for shortness of breath.   Cardiovascular: Positive for chest pain and palpitations.  Gastrointestinal: Positive for abdominal pain. Negative for blood in stool.  Genitourinary: Vaginal bleeding: sharp pains in stomach last 5-10 minutes   Musculoskeletal: Negative for back pain and arthralgias.  Skin: Negative for rash.  Allergic/Immunologic: Negative for immunocompromised state.  Hematological: Negative for adenopathy. Does not bruise/bleed easily.  Psychiatric/Behavioral: Negative for suicidal ideas and dysphoric  mood. The patient is nervous/anxious.     Objective:  BP 114/73 mmHg  Pulse 76  Temp(Src) 98 F (36.7 C) (Oral)  Resp 16  Ht 5\' 1"  (1.549 m)  Wt 189 lb (85.73 kg)  BMI 35.73 kg/m2  SpO2 98%  BP/Weight 07/26/2015 04/13/2015 Q000111Q  Systolic BP 99991111 [id number]  Diastolic BP 73 84 80  Wt. (Lbs) 189 192 196.85  BMI 35.73 36.3 37.21   Physical Exam  Constitutional: She is oriented to person, place, and time. She appears well-developed and well-nourished. No distress.  HENT:  Head: Normocephalic and atraumatic.    Cardiovascular: Normal rate, regular rhythm, normal heart sounds and intact distal pulses.   Pulmonary/Chest: Effort normal and breath sounds normal.  Musculoskeletal: She exhibits no edema.  Neurological: She is alert and oriented to person, place, and time.  Skin: Skin is warm and dry. No rash noted.  Psychiatric: She has a normal mood and affect.   EKG: sinus bradycardia.  Lab Results  Component Value Date   HGBA1C 6.30 04/13/2015   CBG 89 Lab Results  Component Value Date   HGBA1C 6.0 07/26/2015    Assessment & Plan:   Linda Phelps was seen today for hypertension and diabetes.  Diagnoses and all orders for this visit:  Controlled type 2 diabetes mellitus without complication, without long-term current use of insulin (HCC) -     Glucose (CBG) -     HgB A1c  Essential hypertension -     Basic Metabolic Panel -     Ambulatory referral to Cardiology  Palpitations -     Ambulatory referral to Cardiology -     Vitamin D, 25-hydroxy  Gastroesophageal reflux disease, esophagitis presence not specified -     Ambulatory referral to Gastroenterology  Healthcare maintenance -     Ambulatory referral to Gastroenterology  Hypothyroidism, unspecified hypothyroidism type -     TSH  Screening for HIV (human immunodeficiency virus) -     HIV antibody (with reflex)  Generalized anxiety disorder -     hydrOXYzine (ATARAX/VISTARIL) 50 MG tablet; Take 1 tablet (50 mg total) by mouth 3 (three) times daily as needed for anxiety.    No orders of the defined types were placed in this encounter.    Follow-up: No Follow-up on file.   Boykin Nearing MD

## 2015-07-26 NOTE — Patient Instructions (Addendum)
Lizzete was seen today for hypertension and diabetes.  Diagnoses and all orders for this visit:  Controlled type 2 diabetes mellitus without complication, without long-term current use of insulin (HCC) -     Glucose (CBG) -     HgB A1c  Essential hypertension -     Basic Metabolic Panel -     Ambulatory referral to Cardiology  Palpitations -     Ambulatory referral to Cardiology -     Vitamin D, 25-hydroxy  Gastroesophageal reflux disease, esophagitis presence not specified -     Ambulatory referral to Gastroenterology  Healthcare maintenance -     Ambulatory referral to Gastroenterology  Hypothyroidism, unspecified hypothyroidism type -     TSH  Screening for HIV (human immunodeficiency virus) -     HIV antibody (with reflex)  Generalized anxiety disorder -     hydrOXYzine (ATARAX/VISTARIL) 50 MG tablet; Take 1 tablet (50 mg total) by mouth 3 (three) times daily as needed for anxiety.    You will be called with cardiology and GI appts  F/u in 4 weeks for pap smear  Dr. Adrian Blackwater

## 2015-07-26 NOTE — Assessment & Plan Note (Signed)
A: well controlled P: continue current regimen Cards referral for stress test

## 2015-07-26 NOTE — Assessment & Plan Note (Signed)
A: DM2 well controlled P: Continue current regimen

## 2015-07-26 NOTE — Progress Notes (Signed)
HFU HTN pain on arm and back  Pain scale #6 No tobacco user  No suicidal thoughts in the past two weeks

## 2015-07-26 NOTE — Progress Notes (Signed)
ASSESSMENT: Pt currently experiencing Generalized anxiety disorder, needs to f/u with PCP and [facility]; would benefit from psychoeducation and Solution-Focused therapy regarding coping with Generalized anxiety disorder, as well as coping with symptoms of anxiety and depression.  Stage of Change: contemplative  PLAN: 1. F/U with behavioral health consultant in one month 2. Psychiatric Medications: atarax. 3. Behavioral recommendation(s):   -Daily relaxation breathing, as practiced in office -Remember importance of self-care for wellbeing -Consider reading educational material regarding coping with symptoms of anxiety and depression, including panic attacks SUBJECTIVE: Pt. referred by Dr Adrian Blackwater for symptoms of anxiety and depression:  Pt. reports the following symptoms/concerns: Pt states that she was first treated for anxiety and depression about six years prior, after nephew passed away; recent health and life stressors (unemployment, preteen daughter) are increasing feelings of anxiety and depression once again. Primary concern is feeling anxiety leading to panic attacks, over stress of heart issues.  Duration of problem: Six years, with recent increase over two months Severity: moderate  OBJECTIVE: Orientation & Cognition: Oriented x3. Thought processes normal and appropriate to situation. Mood: appropriate. Affect: appropriate Appearance: appropriate Risk of harm to self or others: no known harm to self or others Substance use: none Assessments administered: PHQ9: 10 GAD7: 14  Diagnosis: Generalized anxiety disorder CPT Code: F41.1 -------------------------------------------- Other(s) present in the room: none  Time spent with patient in exam room: 20 minutes, 11:00-11:30am   Depression screen [facility] 2/9 07/26/2015 04/13/2015 09/23/2014  Decreased Interest 2 0 0  Down, Depressed, Hopeless 2 0 1  PHQ - 2 Score 4 0 1  Altered sleeping 2 - -  Tired, decreased energy 2 - -  Change in  appetite 2 - -  Feeling bad or failure about yourself  0 - -  Trouble concentrating 0 - -  Moving slowly or fidgety/restless 0 - -  Suicidal thoughts 0 - -  PHQ-9 Score 10 - -  Difficult doing work/chores Not difficult at all - -    GAD 7 : Generalized Anxiety Score 07/26/2015  Nervous, Anxious, on Edge 2  Control/stop worrying 2  Worry too much - different things 2  Trouble relaxing 2  Restless 2  Easily annoyed or irritable 2  Afraid - awful might happen 2  Total GAD 7 Score 14

## 2015-07-27 DIAGNOSIS — E559 Vitamin D deficiency, unspecified: Secondary | ICD-10-CM | POA: Insufficient documentation

## 2015-07-27 LAB — HIV ANTIBODY (ROUTINE TESTING W REFLEX): HIV 1&2 Ab, 4th Generation: NONREACTIVE

## 2015-07-27 LAB — VITAMIN D 25 HYDROXY (VIT D DEFICIENCY, FRACTURES): VIT D 25 HYDROXY: 18 ng/mL — AB (ref 30–100)

## 2015-07-27 MED ORDER — VITAMIN D (ERGOCALCIFEROL) 1.25 MG (50000 UNIT) PO CAPS: 50000.0000 [IU] | ORAL_CAPSULE | ORAL | Status: DC

## 2015-07-27 MED FILL — VIT D2 1.25 MG (50,000 UNIT: 1.25 MG | 56 days supply | Qty: 8 | Fill #0

## 2015-07-27 NOTE — Addendum Note (Signed)
Addended by: Boykin Nearing on: 07/27/2015 08:38 AM   Modules accepted: Orders, SmartSet

## 2015-07-27 NOTE — Addendum Note (Signed)
Addended by: Boykin Nearing on: 07/27/2015 08:55 AM   Modules accepted: Orders, SmartSet

## 2015-07-28 ENCOUNTER — Telehealth: Payer: Self-pay | Admitting: *Deleted

## 2015-07-28 NOTE — Telephone Encounter (Signed)
-----   Message from Boykin Nearing, MD sent at 07/27/2015  8:37 AM EST ----- Kidney function is normal Vit D was low last year has improved for 10 points but still a bit low, 8 weeks of weekly D3 ordered

## 2015-07-28 NOTE — Telephone Encounter (Signed)
Date of birth verified by pt  Lab results given  Vid D Low Rx send to [place] [facility]

## 2015-08-05 ENCOUNTER — Ambulatory Visit: Payer: Self-pay | Admitting: Cardiology

## 2015-08-23 ENCOUNTER — Telehealth: Payer: Self-pay | Admitting: Family Medicine

## 2015-08-23 ENCOUNTER — Ambulatory Visit: Payer: Self-pay | Admitting: Cardiology

## 2015-08-23 MED FILL — LEVOTHYROXINE 50 MCG TABLET: 50 | 30 days supply | Qty: 30 | Fill #2

## 2015-08-23 MED FILL — PRAVASTATIN SODIUM 10 MG TA: 10 | 30 days supply | Qty: 30 | Fill #4

## 2015-08-23 MED FILL — ?FUROSEMIDE 20 MG TABLET: 20 | 30 days supply | Qty: 60 | Fill #3

## 2015-08-23 MED FILL — ?AMLODIPINE BESYLATE 10 MG: 10 | 30 days supply | Qty: 30 | Fill #4

## 2015-08-23 MED FILL — LISINOPRIL 20 MG TABLET: 20 | 30 days supply | Qty: 30 | Fill #3

## 2015-08-23 NOTE — Telephone Encounter (Signed)
Pt. Returned call and was told she needed an OV. Pt. Was also told that the next available appointment would be on 09/06/15. She said if we had an urgent care and she was giving there number to call and walk-in.

## 2015-08-23 NOTE — Telephone Encounter (Signed)
Pt. Called stating that she has a stye in her right eye. Pt. Would like to know if her PCP can prescribe her an Rx for it. Pt. Was offered an appointment and was told it would be until next week and she stated that was to far out. Please f/u with pt for advise.

## 2015-08-23 NOTE — Telephone Encounter (Signed)
LVM to return call   PT Need to schedule appointment with PCP

## 2015-09-02 ENCOUNTER — Telehealth: Payer: Self-pay | Admitting: Clinical

## 2015-09-02 DIAGNOSIS — F411 Generalized anxiety disorder: Secondary | ICD-10-CM

## 2015-09-02 NOTE — Telephone Encounter (Signed)
Termination w pt; Linda Phelps is aware she may make an appointment with [facility] prior to the end of April, as needed.   Ms. Zeppieri says she is not taking the Atarax, that it is not helping her anxiety; she has researched it and says "that's supposed to be for itching and bug bites", and says that when she was previously on Xanax and Klonopin, they worked better. She wants to know if there is anything else she can be prescribed for anxiety, other than the Atarax? Please f/u with pt .

## 2015-09-06 ENCOUNTER — Ambulatory Visit: Payer: Self-pay | Admitting: Cardiology

## 2015-09-10 MED ORDER — BUSPIRONE HCL 5 MG PO TABS: 5.0000 mg | ORAL_TABLET | Freq: Two times a day (BID) | ORAL | Status: DC

## 2015-09-10 NOTE — Addendum Note (Signed)
Addended by: Boykin Nearing on: 09/10/2015 12:17 PM   Modules accepted: Orders, Medications

## 2015-09-10 NOTE — Telephone Encounter (Signed)
Please call patient Atarax is an antihistamine used to treat anxiety symptoms   Since atarax is not helping  Dc atarax   Add buspar 5 mg twice daily to start, with plan to increase strength as tolerated. Seek ongoing counseling for anxiety as well

## 2015-09-17 ENCOUNTER — Other Ambulatory Visit: Payer: Self-pay | Admitting: Internal Medicine

## 2015-09-17 ENCOUNTER — Ambulatory Visit: Payer: Self-pay | Admitting: Cardiology

## 2015-09-17 DIAGNOSIS — I10 Essential (primary) hypertension: Secondary | ICD-10-CM

## 2015-09-20 NOTE — Telephone Encounter (Signed)
LVM to return call.

## 2015-09-21 DIAGNOSIS — Z7982 Long term (current) use of aspirin: Secondary | ICD-10-CM | POA: Insufficient documentation

## 2015-09-21 DIAGNOSIS — O903 Peripartum cardiomyopathy: Secondary | ICD-10-CM | POA: Insufficient documentation

## 2015-09-21 DIAGNOSIS — Z9119 Patient's noncompliance with other medical treatment and regimen: Secondary | ICD-10-CM | POA: Insufficient documentation

## 2015-09-21 DIAGNOSIS — I5022 Chronic systolic (congestive) heart failure: Secondary | ICD-10-CM

## 2015-09-21 DIAGNOSIS — Z91199 Patient's noncompliance with other medical treatment and regimen due to unspecified reason: Secondary | ICD-10-CM | POA: Insufficient documentation

## 2015-09-21 HISTORY — DX: Patient's noncompliance with other medical treatment and regimen: Z91.19

## 2015-09-21 HISTORY — DX: Peripartum cardiomyopathy: O90.3

## 2015-09-21 HISTORY — DX: Chronic systolic (congestive) heart failure: I50.22

## 2015-09-21 HISTORY — DX: Long term (current) use of aspirin: Z79.82

## 2015-09-21 HISTORY — DX: Patient's noncompliance with other medical treatment and regimen due to unspecified reason: Z91.199

## 2015-09-22 ENCOUNTER — Telehealth: Payer: Self-pay | Admitting: *Deleted

## 2015-09-22 DIAGNOSIS — I10 Essential (primary) hypertension: Secondary | ICD-10-CM

## 2015-09-22 DIAGNOSIS — K219 Gastro-esophageal reflux disease without esophagitis: Secondary | ICD-10-CM

## 2015-09-22 DIAGNOSIS — I517 Cardiomegaly: Secondary | ICD-10-CM

## 2015-09-22 DIAGNOSIS — E119 Type 2 diabetes mellitus without complications: Secondary | ICD-10-CM

## 2015-09-22 MED FILL — FUROSEMIDE 20 MG TABLET: 20 | 30 days supply | Qty: 60 | Fill #0

## 2015-09-22 NOTE — Telephone Encounter (Signed)
Pt stated Stop Pravastatin due to chest pain, Pt read in medication insert, chest pain  was one the side effect to this medication No Chest pain since Pravastatin was stopped  Requesting changes on medication, send Rx to [facility]

## 2015-09-24 ENCOUNTER — Other Ambulatory Visit: Payer: Self-pay | Admitting: Family Medicine

## 2015-09-24 MED ORDER — ATORVASTATIN CALCIUM 40 MG PO TABS: 40.0000 mg | ORAL_TABLET | Freq: Every day | ORAL | Status: DC

## 2015-09-24 MED ORDER — OMEPRAZOLE 20 MG PO CPDR: 20.0000 mg | DELAYED_RELEASE_CAPSULE | Freq: Two times a day (BID) | ORAL | Status: DC

## 2015-09-24 MED FILL — ?OMEPRAZOLE DR 20 MG CAPSUL: 20 | 30 days supply | Qty: 60 | Fill #0

## 2015-09-24 MED FILL — ?AMLODIPINE BESYLATE 10 MG: 10 | 30 days supply | Qty: 30 | Fill #5

## 2015-09-24 MED FILL — ?LEVOTHYROXINE 50 MCG TABLE: 50 | 30 days supply | Qty: 30 | Fill #0

## 2015-09-24 MED FILL — ATORVASTATIN 40 MG TABLET: 40 | 30 days supply | Qty: 30 | Fill #0

## 2015-09-24 MED FILL — ?LISINOPRIL 20 MG TABLET: 20 | 30 days supply | Qty: 30 | Fill #4

## 2015-09-24 NOTE — Telephone Encounter (Signed)
Called patient Verified name and DOB.  1. Anxiety: improved since stopping pravastatin. She was having pains in her chest with pravastatin which made her worry about her heart failure. She has not yet started buspar. She did not know it was available. She has stopped atarax as it did not help with anxiety and only made her sleepy. Informed her that buspar is for anxiety. Possible SE is dizziness. She does not need to take if anxiety is better since stopping pravastatin. Replacing pravastatin with lipitor.   2. ? CHF: she has not had repeat ECHO due to cost and lack of insurance. She cancelled her appt with Dr. Marlou Porch due to concerns about cost. She schedule an appt with a cardiologist in [facility] today.  No CP or SOB. No swelling. Reviewed last OV vitals- BP well controlled, weight decrease.  Plan: Cancel cards appt Repeat ECHO to determine if she truly has CHF and to what degee  3. GERD: went to [place] [facility] for stomach pains. Dx with GERD. Taking zantac 150 mg BID without relief. Denies ETOH. Takes aleve 1-2 times per week. Some constipation. No lower abdominal pain or diarrhea.  Plan to stop zantac and replace with prilosec.  Stella please schedule ECHO and call patient with appt details Also facilitate patient getting a f/u OV with me in the week following her  ECHO to review

## 2015-09-27 NOTE — Telephone Encounter (Signed)
Pt aware ECHO on Sep 24, 2015 at 3:15 Will schedule F/U appointment after ECHO with PCP

## 2015-09-28 ENCOUNTER — Telehealth: Payer: Self-pay | Admitting: Family Medicine

## 2015-09-28 DIAGNOSIS — E119 Type 2 diabetes mellitus without complications: Secondary | ICD-10-CM

## 2015-09-28 DIAGNOSIS — E669 Obesity, unspecified: Secondary | ICD-10-CM

## 2015-09-28 NOTE — Telephone Encounter (Signed)
Suan Halter called from [facility] and left a vm on my machine. Stating that pt had an appt earlier this afternoon and they needed ekg tracings from 07/26/15. I have not received an authorization to release her records. If provider permits, please fax records to [phone]. Thank you.

## 2015-09-29 MED FILL — busPIRone HCL 5 MG TABS: 5 | 30 days supply | Qty: 60 | Fill #0

## 2015-09-30 ENCOUNTER — Ambulatory Visit (HOSPITAL_BASED_OUTPATIENT_CLINIC_OR_DEPARTMENT_OTHER)
Admission: RE | Admit: 2015-09-30 | Discharge: 2015-09-30 | Disposition: A | Discharge status: Home / Self Care | Payer: Self-pay | Source: Ambulatory Visit | Attending: Family Medicine | Admitting: Family Medicine

## 2015-09-30 ENCOUNTER — Other Ambulatory Visit (HOSPITAL_COMMUNITY): Payer: Self-pay

## 2015-09-30 DIAGNOSIS — Z6835 Body mass index (BMI) 35.0-35.9, adult: Secondary | ICD-10-CM | POA: Insufficient documentation

## 2015-09-30 DIAGNOSIS — E669 Obesity, unspecified: Secondary | ICD-10-CM | POA: Insufficient documentation

## 2015-09-30 DIAGNOSIS — I509 Heart failure, unspecified: Secondary | ICD-10-CM | POA: Insufficient documentation

## 2015-09-30 DIAGNOSIS — E7800 Pure hypercholesterolemia, unspecified: Secondary | ICD-10-CM | POA: Insufficient documentation

## 2015-09-30 DIAGNOSIS — E119 Type 2 diabetes mellitus without complications: Secondary | ICD-10-CM | POA: Insufficient documentation

## 2015-09-30 DIAGNOSIS — I517 Cardiomegaly: Secondary | ICD-10-CM

## 2015-09-30 DIAGNOSIS — I10 Essential (primary) hypertension: Secondary | ICD-10-CM

## 2015-09-30 DIAGNOSIS — I110 Hypertensive heart disease with heart failure: Secondary | ICD-10-CM | POA: Insufficient documentation

## 2015-09-30 NOTE — Telephone Encounter (Signed)
Pt. Called requesting an appointment with her PCP. Pt. Was informed that we did not have any appointments available for the rest of the month and she would have to call at the end of the month to schedule the appointment. Pt. Asked to speak with the nurse. Please f/u with pt.

## 2015-09-30 NOTE — Progress Notes (Signed)
Echocardiogram 2D Echocardiogram has been performed.  Tresa Res 09/30/2015, 3:35 PM

## 2015-10-01 ENCOUNTER — Encounter: Payer: Self-pay | Admitting: Cardiology

## 2015-10-01 DIAGNOSIS — E669 Obesity, unspecified: Secondary | ICD-10-CM

## 2015-10-01 DIAGNOSIS — E66811 Obesity, class 1: Secondary | ICD-10-CM

## 2015-10-01 HISTORY — DX: Obesity, unspecified: E66.9

## 2015-10-01 HISTORY — DX: Obesity, class 1: E66.811

## 2015-10-01 MED ORDER — METFORMIN HCL ER 500 MG PO TB24: 1000.0000 mg | ORAL_TABLET | Freq: Two times a day (BID) | ORAL | Status: DC

## 2015-10-01 NOTE — Telephone Encounter (Signed)
-----   Message from Boykin Nearing, MD sent at 09/30/2015  7:13 PM EDT ----- ECHO reveals some diastolic dysfunction and left ventricle wall thickness consistent with HTN that was not controlled in the past. Heart pumping/systolic function is normal. There is no congestive heart failure. This is great news! I will call the patient

## 2015-10-01 NOTE — Telephone Encounter (Signed)
Called patient Verified name, DOB  Gave ECHO results She was pleased to hear results  She wonders why she still has chest pains when she moves. She has large breast, bra straps indent her shoulder, overweight. Would like breast reduction, uninsured.  Plan: Metformin XR 500 mg two tabs BID to help with weight loss No need for cardiology appt, no need to fax EKGs to cardioloogy clinic.

## 2015-10-19 IMAGING — CR DG CHEST 2V
2 series · 2 of 2 positions shown · non-contrast
Comparison: Chest x-ray 09/05/2011 .

CLINICAL DATA: Chest pain.

EXAM:
CHEST  2 VIEW

[w chest pa]
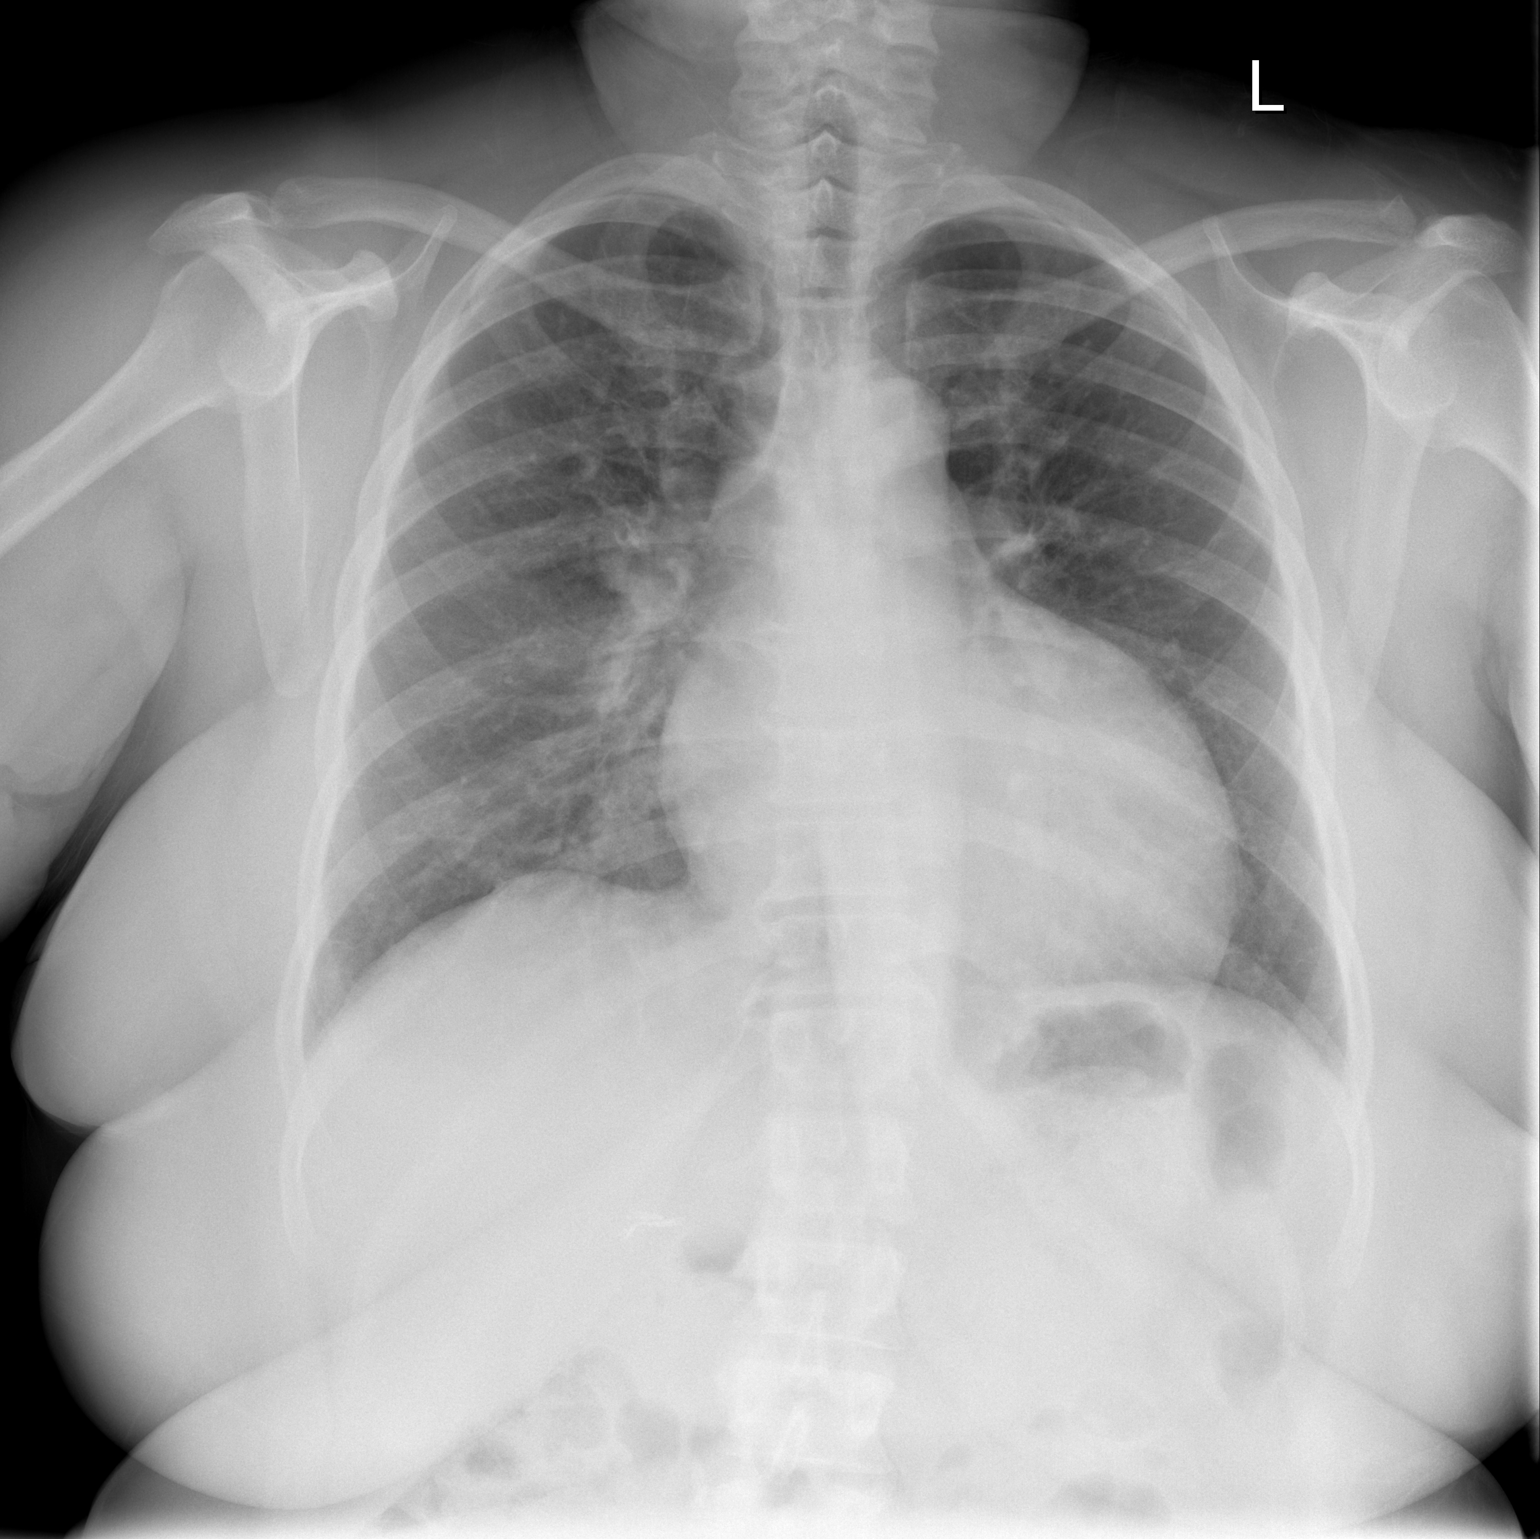

[w chest lat]
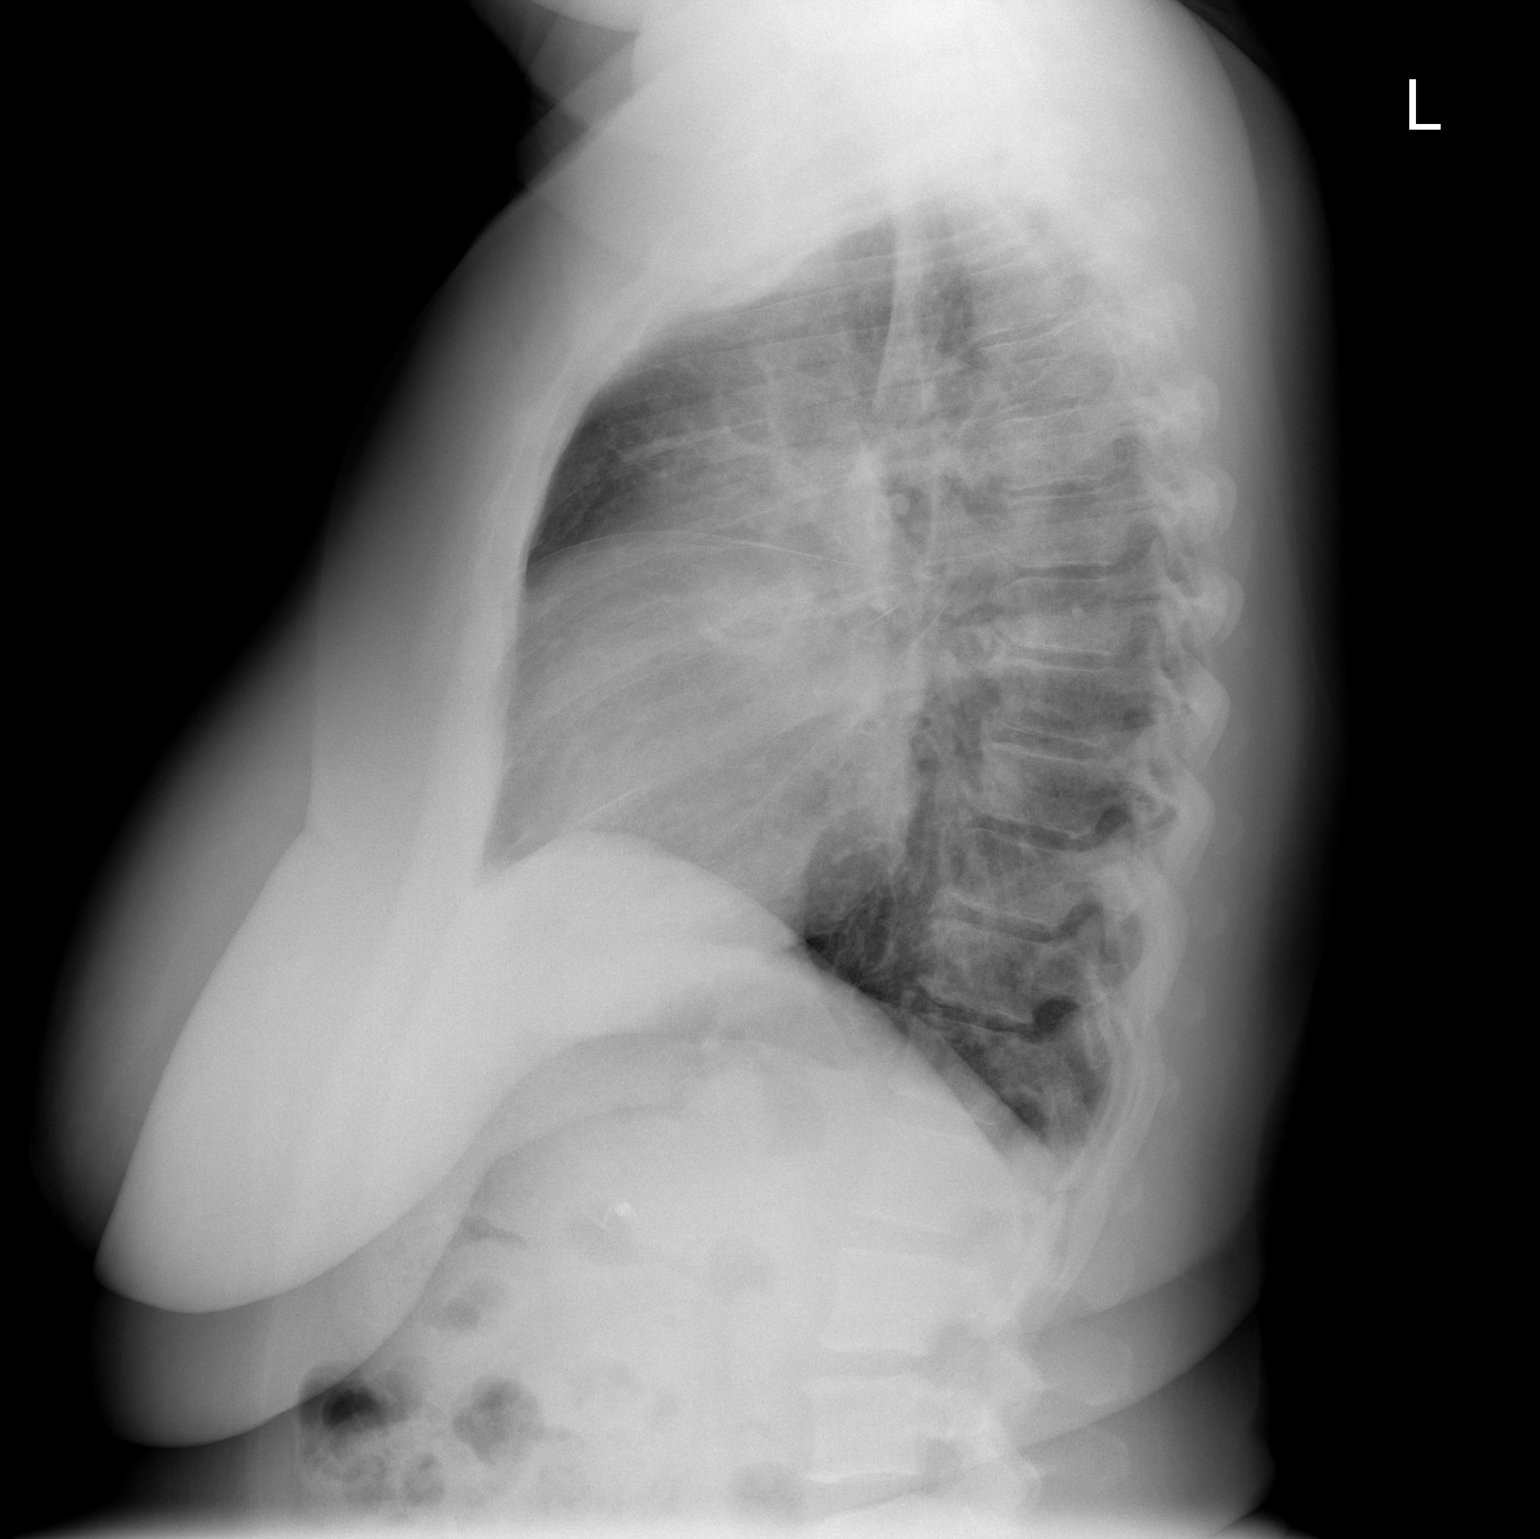

[2 of 2 positions shown; findings below may reference images not displayed]

FINDINGS: Mediastinum and hilar structures are normal. Heart size normal.
Pulmonary vascularity is normal. Mild infiltrate right lung base. No
pleural effusion or pneumothorax. Surgical clips right upper
quadrant. No acute bony abnormality.
IMPRESSION: 1.  Right lower lobe infiltrate consistent with pneumonia.

2.  Cardiomegaly, no pulmonary venous congestion.

## 2015-10-25 ENCOUNTER — Other Ambulatory Visit: Payer: Self-pay | Admitting: Family Medicine

## 2015-10-25 MED FILL — ?OMEPRAZOLE DR 20 MG CAPSUL: 20 | 30 days supply | Qty: 60 | Fill #1

## 2015-10-25 MED FILL — FUROSEMIDE 20 MG TABLET: 20 | 30 days supply | Qty: 60 | Fill #1

## 2015-10-25 MED FILL — METFORMIN HCL ER 500 MG TAB: 500 | 30 days supply | Qty: 120 | Fill #0

## 2015-10-25 MED FILL — ?AMLODIPINE BESYLATE 10 MG: 10 | 30 days supply | Qty: 30 | Fill #0

## 2015-10-25 MED FILL — ?LEVOTHYROXINE 50 MCG TABLE: 50 | 30 days supply | Qty: 30 | Fill #1

## 2015-10-25 MED FILL — LISINOPRIL 20 MG TABLET: 20 | 30 days supply | Qty: 30 | Fill #5

## 2015-11-08 ENCOUNTER — Telehealth: Payer: Self-pay | Admitting: Family Medicine

## 2015-11-08 DIAGNOSIS — E669 Obesity, unspecified: Secondary | ICD-10-CM

## 2015-11-08 DIAGNOSIS — E119 Type 2 diabetes mellitus without complications: Secondary | ICD-10-CM

## 2015-11-08 NOTE — Telephone Encounter (Signed)
Patient called requesting to speak to patient regarding metformin, patient is confused on how to take medication.Patient would like to clarify please f/up

## 2015-11-10 NOTE — Telephone Encounter (Signed)
LVM to return call.

## 2015-11-10 NOTE — Telephone Encounter (Signed)
Patient returned nurse phonecall. Patient is best reached between 9am and 10am.

## 2015-11-11 MED ORDER — METFORMIN HCL ER 500 MG PO TB24: ORAL_TABLET | ORAL | Status: DC

## 2015-11-11 NOTE — Telephone Encounter (Signed)
LVM to return call.

## 2015-11-11 NOTE — Telephone Encounter (Signed)
Called patient verified name and DOB.  She is taking metformin 500 mg in AM and 1000 mg PM, changed metformin to reflect this.   She still has some palpitations, about 2 week, no dizziness, lightheadedness or CP. We reviewed her ECHO and last EKG.  Plan for cards referral if symptoms become symptomatic.   She also reports vaginal bleeding after no bleeding for 3 years. Plan for f.u OV for pelvic exam and pelvic/TVUS. She will call for appt on 11/24/15.

## 2015-11-12 ENCOUNTER — Telehealth: Payer: Self-pay | Admitting: Family Medicine

## 2015-11-12 NOTE — Telephone Encounter (Signed)
Pt. Was scheduled an appointment with her PCP for 11/26/15 for abnormal vaginal bleeding.

## 2015-11-17 ENCOUNTER — Other Ambulatory Visit: Payer: Self-pay | Admitting: Family Medicine

## 2015-11-17 MED FILL — ?LEVOTHYROXINE 50 MCG TABLE: 50 | 30 days supply | Qty: 30 | Fill #2

## 2015-11-17 MED FILL — ?LISINOPRIL 20 MG TABLET: 20 | 30 days supply | Qty: 30 | Fill #0

## 2015-11-17 MED FILL — AMLODIPINE BESYLATE 10 MG T: 10 | 30 days supply | Qty: 30 | Fill #1

## 2015-11-17 MED FILL — ?FUROSEMIDE 20 MG TABLET: 20 | 30 days supply | Qty: 60 | Fill #2

## 2015-11-26 ENCOUNTER — Ambulatory Visit: Payer: Self-pay | Admitting: Family Medicine

## 2015-12-13 ENCOUNTER — Other Ambulatory Visit: Payer: Self-pay | Admitting: Family Medicine

## 2015-12-13 DIAGNOSIS — B353 Tinea pedis: Secondary | ICD-10-CM

## 2015-12-13 MED FILL — AMLODIPINE BESYLATE 10 MG T: 10 | 30 days supply | Qty: 30 | Fill #2

## 2015-12-13 MED FILL — ?LEVOTHYROXINE 50 MCG TABLE: 50 | 30 days supply | Qty: 30 | Fill #3

## 2015-12-13 MED FILL — ?LISINOPRIL 20 MG TABLET: 20 | 30 days supply | Qty: 30 | Fill #1

## 2015-12-13 MED FILL — METFORMIN HCL ER 500 MG TAB: 500 | 30 days supply | Qty: 90 | Fill #0

## 2015-12-13 MED FILL — ?FUROSEMIDE 20 MG TABLET: 20 | 30 days supply | Qty: 60 | Fill #3

## 2015-12-13 NOTE — Telephone Encounter (Signed)
Refill request ketoconazole

## 2015-12-28 ENCOUNTER — Telehealth: Payer: Self-pay | Admitting: Family Medicine

## 2015-12-28 DIAGNOSIS — I10 Essential (primary) hypertension: Secondary | ICD-10-CM

## 2015-12-28 MED ORDER — CARVEDILOL 12.5 MG PO TABS: 12.5000 mg | ORAL_TABLET | Freq: Two times a day (BID) | ORAL | 3 refills | Status: DC

## 2015-12-28 NOTE — Telephone Encounter (Signed)
Carvedilol refilled and sent to [facility]

## 2015-12-28 NOTE — Telephone Encounter (Signed)
Pt called requesting medication refill on carvedilol (COREG) 12.5 MG tablet, Pt states she uses [facility] [address]. Please f/up

## 2016-01-03 ENCOUNTER — Telehealth: Payer: Self-pay | Admitting: Family Medicine

## 2016-01-03 NOTE — Telephone Encounter (Signed)
Pt. Called requesting to speak with her nurse b/c she has a stye on her left eye. Pt. Has an appointment with her PCP on 01/11/16 and requested to be seen sooner. She was scheduled with Levada Dy on 01/06/16 and pt. Stated she would call urgent care to see if they had a fee. Pt. Would like advise on what to put on her eye. Please f/u

## 2016-01-06 ENCOUNTER — Ambulatory Visit: Payer: Self-pay

## 2016-01-07 ENCOUNTER — Other Ambulatory Visit: Payer: Self-pay | Admitting: Family Medicine

## 2016-01-07 DIAGNOSIS — I10 Essential (primary) hypertension: Secondary | ICD-10-CM

## 2016-01-07 MED FILL — METFORMIN HCL ER 500 MG TAB: 500 | 30 days supply | Qty: 90 | Fill #1

## 2016-01-07 MED FILL — ?FUROSEMIDE 20 MG TABLET: 20 | 30 days supply | Qty: 60 | Fill #0

## 2016-01-07 MED FILL — ?AMLODIPINE BESYLATE 10 MG: 10 | 30 days supply | Qty: 30 | Fill #3

## 2016-01-07 MED FILL — ?LISINOPRIL 20 MG TABLET: 20 | 30 days supply | Qty: 30 | Fill #2

## 2016-01-07 MED FILL — LEVOTHYROXINE 50 MCG TABLET: 50 | 30 days supply | Qty: 30 | Fill #0

## 2016-01-11 ENCOUNTER — Ambulatory Visit: Payer: Self-pay | Attending: Family Medicine | Admitting: Family Medicine

## 2016-01-11 ENCOUNTER — Encounter: Payer: Self-pay | Admitting: Family Medicine

## 2016-01-11 VITALS — BP 143/84 | HR 65 | Temp 98.2°F | Ht 61.0 in | Wt 197.6 lb

## 2016-01-11 DIAGNOSIS — E559 Vitamin D deficiency, unspecified: Secondary | ICD-10-CM | POA: Insufficient documentation

## 2016-01-11 DIAGNOSIS — N926 Irregular menstruation, unspecified: Secondary | ICD-10-CM

## 2016-01-11 DIAGNOSIS — Z0000 Encounter for general adult medical examination without abnormal findings: Secondary | ICD-10-CM

## 2016-01-11 DIAGNOSIS — I10 Essential (primary) hypertension: Secondary | ICD-10-CM | POA: Insufficient documentation

## 2016-01-11 DIAGNOSIS — Z79899 Other long term (current) drug therapy: Secondary | ICD-10-CM | POA: Insufficient documentation

## 2016-01-11 DIAGNOSIS — E119 Type 2 diabetes mellitus without complications: Secondary | ICD-10-CM | POA: Insufficient documentation

## 2016-01-11 DIAGNOSIS — N938 Other specified abnormal uterine and vaginal bleeding: Secondary | ICD-10-CM | POA: Insufficient documentation

## 2016-01-11 DIAGNOSIS — H00019 Hordeolum externum unspecified eye, unspecified eyelid: Secondary | ICD-10-CM

## 2016-01-11 DIAGNOSIS — Z7982 Long term (current) use of aspirin: Secondary | ICD-10-CM | POA: Insufficient documentation

## 2016-01-11 DIAGNOSIS — E039 Hypothyroidism, unspecified: Secondary | ICD-10-CM | POA: Insufficient documentation

## 2016-01-11 HISTORY — DX: Irregular menstruation, unspecified: N92.6

## 2016-01-11 HISTORY — DX: Hordeolum externum unspecified eye, unspecified eyelid: H00.019

## 2016-01-11 LAB — CBC
HCT: 38.4 % (ref 35.0–45.0)
HEMOGLOBIN: 12.4 g/dL (ref 11.7–15.5)
MCH: 29.2 pg (ref 27.0–33.0)
MCHC: 32.3 g/dL (ref 32.0–36.0)
MCV: 90.6 fL (ref 80.0–100.0)
MPV: 10.4 fL (ref 7.5–12.5)
Platelets: 313 10*3/uL (ref 140–400)
RBC: 4.24 MIL/uL (ref 3.80–5.10)
RDW: 13.7 % (ref 11.0–15.0)
WBC: 6.2 10*3/uL (ref 3.8–10.8)

## 2016-01-11 LAB — POCT GLYCOSYLATED HEMOGLOBIN (HGB A1C): HEMOGLOBIN A1C: 6.5

## 2016-01-11 LAB — GLUCOSE, POCT (MANUAL RESULT ENTRY): POC GLUCOSE: 87 mg/dL (ref 70–99)

## 2016-01-11 NOTE — Assessment & Plan Note (Signed)
A: HTN with weight gain P: Continue current regimen Low salt diet Focus on weight loss

## 2016-01-11 NOTE — Progress Notes (Signed)
Subjective:  Patient ID: Linda Phelps, female    DOB: 1971/04/22  Age: 45 y.o. MRN: [id number]  CC: Medication Problem   HPI Linda Phelps presents for   1. HTN: having HA, some blurry vision. Always with upper chest sorness. No SOB. No leg swelling. She is compliant with all of her BP medicine. She did not take coreg this AM. She is not compliant with low salt diet. She walks 4 miles a day, 2 days a week. She has gained weight.   2. Stye: recurrent styes in both eyes. With swelling. No pain. She missed two days of work with last stye in L lateral upper lid. No pain or swelling now.  3. Menstrual problem: she had a 3 day period last month after no period for 3 years. No bleeding now. She has known uterine fibroids. She declines pelvic exam today.   Social History  Substance Use Topics  . Smoking status: Never Smoker  . Smokeless tobacco: Not on file  . Alcohol use 0.0 oz/week     Comment: occ    Outpatient Medications Prior to Visit  Medication Sig Dispense Refill  . amLODipine (NORVASC) 10 MG tablet TAKE 1 TABLET BY MOUTH DAILY. 30 tablet 2  . aspirin 81 MG tablet Take 81 mg by mouth daily.    Marland Kitchen atorvastatin (LIPITOR) 40 MG tablet Take 1 tablet (40 mg total) by mouth daily. 90 tablet 3  . busPIRone (BUSPAR) 5 MG tablet Take 1 tablet (5 mg total) by mouth 2 (two) times daily. 60 tablet 1  . carvedilol (COREG) 12.5 MG tablet Take 1 tablet (12.5 mg total) by mouth 2 (two) times daily with a meal. 60 tablet 3  . fluticasone (FLONASE) 50 MCG/ACT nasal spray Place 2 sprays into both nostrils daily. 16 g 6  . furosemide (LASIX) 20 MG tablet TAKE 1 TABLET BY MOUTH 2 TIMES DAILY. 60 tablet 0  . ketoconazole (NIZORAL) 2 % cream APPLY 1 APPLICATION TOPICALLY DAILY. 60 g 0  . levothyroxine (SYNTHROID, LEVOTHROID) 50 MCG tablet TAKE 1 TABLET BY MOUTH DAILY BEFORE BREAKFAST 30 tablet 3  . lisinopril (PRINIVIL,ZESTRIL) 20 MG tablet TAKE 1 TABLET BY MOUTH DAILY. 30 tablet 2  . metFORMIN  (GLUCOPHAGE XR) 500 MG 24 hr tablet 500 mg XR in AM, and 1000 mg XR in PM 90 tablet 5  . omeprazole (PRILOSEC) 20 MG capsule Take 1 capsule (20 mg total) by mouth 2 (two) times daily before a meal. 60 capsule 3  . Vitamin D, Ergocalciferol, (DRISDOL) 50000 units CAPS capsule Take 1 capsule (50,000 Units total) by mouth every 7 (seven) days. For 8 weeks 8 capsule 0   No facility-administered medications prior to visit.     ROS Review of Systems  Constitutional: Negative for chills and fever.  Eyes: Negative for visual disturbance.  Respiratory: Negative for shortness of breath.   Cardiovascular: Negative for chest pain.  Gastrointestinal: Negative for abdominal pain and blood in stool.  Genitourinary: Positive for menstrual problem.  Musculoskeletal: Negative for arthralgias and back pain.  Skin: Negative for rash.  Allergic/Immunologic: Negative for immunocompromised state.  Hematological: Negative for adenopathy. Does not bruise/bleed easily.  Psychiatric/Behavioral: Negative for dysphoric mood and suicidal ideas.    Objective:  BP (!) 143/84 (BP Location: Left Arm, Patient Position: Sitting, Cuff Size: Large)   Pulse 65   Temp 98.2 F (36.8 C) (Oral)   Ht 5\' 1"  (1.549 m)   Wt 197 lb 9.6 oz (89.6 kg)  SpO2 98%   BMI 37.34 kg/m   BP/Weight 01/11/2016 07/26/2015 [id number]  Systolic BP [id number]  Diastolic BP 84 73 84  Wt. (Lbs) 197.6 189 192  BMI 37.34 35.73 36.3   Physical Exam  Constitutional: She is oriented to person, place, and time. She appears well-developed and well-nourished. No distress.  Obese   HENT:  Head: Normocephalic and atraumatic.  Cardiovascular: Normal rate, regular rhythm, normal heart sounds and intact distal pulses.   Pulmonary/Chest: Effort normal and breath sounds normal.  Musculoskeletal: She exhibits no edema.  Neurological: She is alert and oriented to person, place, and time.  Skin: Skin is warm and dry. No rash noted.  Psychiatric: She  has a normal mood and affect.    Lab Results  Component Value Date   HGBA1C 6.5 01/11/2016   CBG 87 Assessment & Plan:  Linda Phelps was seen today for medication problem.  Diagnoses and all orders for this visit:  Healthcare maintenance -     Flu Vaccine QUAD 36+ mos IM  Controlled type 2 diabetes mellitus without complication, without long-term current use of insulin (HCC) -     POCT glucose (manual entry) -     POCT glycosylated hemoglobin (Hb A1C)  Essential hypertension  Hypothyroidism, unspecified hypothyroidism type -     TSH  Vitamin D deficiency -     Vitamin D, 25-hydroxy  Menstrual cycle problem -     CBC  Hordeolum externum (stye), unspecified laterality -     Ambulatory referral to Ophthalmology  Other orders -     Cancel: Cytology - PAP    No orders of the defined types were placed in this encounter.   Follow-up: No Follow-up on file.   Boykin Nearing MD

## 2016-01-11 NOTE — Progress Notes (Signed)
C/C: patient have been having styes on her eyes

## 2016-01-11 NOTE — Assessment & Plan Note (Signed)
Menses after none for 3 years She declined pelvic exam today  Plan CBC Pelvic exam and ultrasound at next OV

## 2016-01-11 NOTE — Assessment & Plan Note (Signed)
Recurrent stye most recently on the L. None now  Plan: opthalmology referral

## 2016-01-11 NOTE — Patient Instructions (Addendum)
Linda Phelps was seen today for medication problem.  Diagnoses and all orders for this visit:  Controlled type 2 diabetes mellitus without complication, without long-term current use of insulin (HCC) -     POCT glucose (manual entry) -     POCT glycosylated hemoglobin (Hb A1C)  Pap smear for cervical cancer screening -     Cancel: Cytology - PAP  Essential hypertension  Hypothyroidism, unspecified hypothyroidism type -     TSH  Vitamin D deficiency -     Vitamin D, 25-hydroxy  Menstrual cycle problem -     CBC   Reduce salt in diet Focus on reducing calories and carbs and increasing protein and veggies to help with weight gain  F/u in 6 weeks for pelvic exam with pap to evaluate menstrual bleeding  Dr. Adrian Blackwater Influenza Virus Vaccine injection (Fluarix) What is this medicine? INFLUENZA VIRUS VACCINE (in floo EN zuh VAHY ruhs vak SEEN) helps to reduce the risk of getting influenza also known as the flu. This medicine may be used for other purposes; ask your health care provider or pharmacist if you have questions. What should I tell my health care provider before I take this medicine? They need to know if you have any of these conditions: -bleeding disorder like hemophilia -fever or infection -Guillain-Barre syndrome or other neurological problems -immune system problems -infection with the human immunodeficiency virus (HIV) or AIDS -low blood platelet counts -multiple sclerosis -an unusual or allergic reaction to influenza virus vaccine, eggs, chicken proteins, latex, gentamicin, other medicines, foods, dyes or preservatives -pregnant or trying to get pregnant -breast-feeding How should I use this medicine? This vaccine is for injection into a muscle. It is given by a health care professional. A copy of Vaccine Information Statements will be given before each vaccination. Read this sheet carefully each time. The sheet may change frequently. Talk to your pediatrician regarding  the use of this medicine in children. Special care may be needed. Overdosage: If you think you have taken too much of this medicine contact a poison control center or emergency room at once. NOTE: This medicine is only for you. Do not share this medicine with others. What if I miss a dose? This does not apply. What may interact with this medicine? -chemotherapy or radiation therapy -medicines that lower your immune system like etanercept, anakinra, infliximab, and adalimumab -medicines that treat or prevent blood clots like warfarin -phenytoin -steroid medicines like prednisone or cortisone -theophylline -vaccines This list may not describe all possible interactions. Give your health care provider a list of all the medicines, herbs, non-prescription drugs, or dietary supplements you use. Also tell them if you smoke, drink alcohol, or use illegal drugs. Some items may interact with your medicine. What should I watch for while using this medicine? Report any side effects that do not go away within 3 days to your doctor or health care professional. Call your health care provider if any unusual symptoms occur within 6 weeks of receiving this vaccine. You may still catch the flu, but the illness is not usually as bad. You cannot get the flu from the vaccine. The vaccine will not protect against colds or other illnesses that may cause fever. The vaccine is needed every year. What side effects may I notice from receiving this medicine? Side effects that you should report to your doctor or health care professional as soon as possible: -allergic reactions like skin rash, itching or hives, swelling of the face, lips, or tongue Side effects  that usually do not require medical attention (report to your doctor or health care professional if they continue or are bothersome): -fever -headache -muscle aches and pains -pain, tenderness, redness, or swelling at site where injected -weak or tired This list  may not describe all possible side effects. Call your doctor for medical advice about side effects. You may report side effects to FDA at 1-800-FDA-1088. Where should I keep my medicine? This vaccine is only given in a clinic, pharmacy, doctor's office, or other health care setting and will not be stored at home. NOTE: This sheet is a summary. It may not cover all possible information. If you have questions about this medicine, talk to your doctor, pharmacist, or health care provider.    2016, Elsevier/Gold Standard. (2007-12-04 09:30:40)

## 2016-01-12 LAB — VITAMIN D 25 HYDROXY (VIT D DEFICIENCY, FRACTURES): VIT D 25 HYDROXY: 23 ng/mL — AB (ref 30–100)

## 2016-01-12 LAB — TSH: TSH: 1.4 m[IU]/L

## 2016-01-13 ENCOUNTER — Telehealth: Payer: Self-pay

## 2016-01-13 DIAGNOSIS — E559 Vitamin D deficiency, unspecified: Secondary | ICD-10-CM | POA: Insufficient documentation

## 2016-01-13 HISTORY — DX: Vitamin D deficiency, unspecified: E55.9

## 2016-01-13 MED ORDER — VITAMIN D (ERGOCALCIFEROL) 1.25 MG (50000 UNIT) PO CAPS: 50000.0000 [IU] | ORAL_CAPSULE | ORAL | 0 refills | Status: DC

## 2016-01-13 NOTE — Addendum Note (Signed)
Addended by: Boykin Nearing on: 01/13/2016 09:14 AM   Modules accepted: Orders

## 2016-01-13 NOTE — Telephone Encounter (Signed)
Pt was called on 8/24 and informed of results.

## 2016-01-14 ENCOUNTER — Other Ambulatory Visit: Payer: Self-pay | Admitting: Family Medicine

## 2016-01-14 DIAGNOSIS — E559 Vitamin D deficiency, unspecified: Secondary | ICD-10-CM

## 2016-02-01 ENCOUNTER — Other Ambulatory Visit: Payer: Self-pay | Admitting: Family Medicine

## 2016-02-01 ENCOUNTER — Telehealth: Payer: Self-pay | Admitting: Family Medicine

## 2016-02-01 DIAGNOSIS — I10 Essential (primary) hypertension: Secondary | ICD-10-CM

## 2016-02-01 MED ORDER — AMLODIPINE BESYLATE 10 MG PO TABS: 10.0000 mg | ORAL_TABLET | Freq: Every day | ORAL | 2 refills | Status: DC

## 2016-02-01 MED ORDER — LISINOPRIL 20 MG PO TABS: 20.0000 mg | ORAL_TABLET | Freq: Every day | ORAL | 2 refills | Status: DC

## 2016-02-01 MED FILL — ?LISINOPRIL 20 MG TABLET: 20 | 30 days supply | Qty: 30 | Fill #0

## 2016-02-01 MED FILL — AMLODIPINE BESYLATE 10 MG T: 10 | 30 days supply | Qty: 30 | Fill #0

## 2016-02-01 MED FILL — FUROSEMIDE 20 MG TABLET: 20 | 30 days supply | Qty: 60 | Fill #0

## 2016-02-01 NOTE — Telephone Encounter (Signed)
Requested medications have been refilled.

## 2016-02-01 NOTE — Telephone Encounter (Signed)
Patient needs authorization for amlodipine and lisinopril. Please follow up.

## 2016-02-07 ENCOUNTER — Telehealth: Payer: Self-pay | Admitting: Family Medicine

## 2016-02-07 NOTE — Telephone Encounter (Signed)
error 

## 2016-02-23 MED FILL — ?LISINOPRIL 20 MG TABLET: 20 | 30 days supply | Qty: 30 | Fill #1

## 2016-02-23 MED FILL — AMLODIPINE BESYLATE 10 MG T: 10 | 30 days supply | Qty: 30 | Fill #1

## 2016-02-23 MED FILL — FUROSEMIDE 20 MG TABLET: 20 | 30 days supply | Qty: 60 | Fill #1

## 2016-02-23 MED FILL — LEVOTHYROXINE 50 MCG TABLET: 50 | 30 days supply | Qty: 30 | Fill #1

## 2016-02-25 MED FILL — METFORMIN HCL ER 500 MG TAB: 500 | 30 days supply | Qty: 90 | Fill #2

## 2016-03-01 MED FILL — ?OMEPRAZOLE DR 20 MG CAPSUL: 20 | 30 days supply | Qty: 60 | Fill #2

## 2016-03-20 MED FILL — FUROSEMIDE 20 MG TABLET: 20 | 30 days supply | Qty: 60 | Fill #2

## 2016-03-20 MED FILL — ?AMLODIPINE BESYLATE 10 MG: 10 | 30 days supply | Qty: 30 | Fill #2

## 2016-03-20 MED FILL — LEVOTHYROXINE 50 MCG TABLET: 50 | 30 days supply | Qty: 30 | Fill #2

## 2016-03-20 MED FILL — LISINOPRIL 20 MG TABLET: 20 | 30 days supply | Qty: 30 | Fill #2

## 2016-03-24 MED FILL — METFORMIN HCL ER 500 MG TAB: 500 | 30 days supply | Qty: 90 | Fill #3

## 2016-04-03 ENCOUNTER — Ambulatory Visit: Payer: Self-pay | Admitting: Family Medicine

## 2016-04-18 ENCOUNTER — Other Ambulatory Visit: Payer: Self-pay | Admitting: Family Medicine

## 2016-04-18 DIAGNOSIS — I10 Essential (primary) hypertension: Secondary | ICD-10-CM

## 2016-04-18 MED FILL — LEVOTHYROXINE 50 MCG TABLET: 50 | 30 days supply | Qty: 30 | Fill #3

## 2016-04-18 MED FILL — LISINOPRIL 20 MG TABLET: 20 | 30 days supply | Qty: 30 | Fill #0

## 2016-04-18 MED FILL — AMLODIPINE BESYLATE 10 MG T: 10 | 30 days supply | Qty: 30 | Fill #0

## 2016-04-18 MED FILL — METFORMIN HCL ER 500 MG TAB: 500 | 30 days supply | Qty: 90 | Fill #4

## 2016-04-18 MED FILL — ?OMEPRAZOLE DR 20 MG CAPSUL: 20 | 30 days supply | Qty: 60 | Fill #3

## 2016-04-18 MED FILL — FUROSEMIDE 20 MG TABLET: 20 | 30 days supply | Qty: 60 | Fill #0

## 2016-05-03 ENCOUNTER — Encounter (HOSPITAL_BASED_OUTPATIENT_CLINIC_OR_DEPARTMENT_OTHER): Payer: Self-pay | Admitting: Emergency Medicine

## 2016-05-03 ENCOUNTER — Emergency Department (HOSPITAL_BASED_OUTPATIENT_CLINIC_OR_DEPARTMENT_OTHER): Payer: Self-pay

## 2016-05-03 ENCOUNTER — Emergency Department (HOSPITAL_BASED_OUTPATIENT_CLINIC_OR_DEPARTMENT_OTHER)
Admission: EM | Admit: 2016-05-03 | Discharge: 2016-05-03 | Disposition: A | Discharge status: Home / Self Care | Payer: Self-pay | Attending: Emergency Medicine | Admitting: Emergency Medicine

## 2016-05-03 DIAGNOSIS — R079 Chest pain, unspecified: Secondary | ICD-10-CM | POA: Insufficient documentation

## 2016-05-03 DIAGNOSIS — I509 Heart failure, unspecified: Secondary | ICD-10-CM | POA: Insufficient documentation

## 2016-05-03 DIAGNOSIS — I110 Hypertensive heart disease with heart failure: Secondary | ICD-10-CM | POA: Insufficient documentation

## 2016-05-03 DIAGNOSIS — Z7984 Long term (current) use of oral hypoglycemic drugs: Secondary | ICD-10-CM | POA: Insufficient documentation

## 2016-05-03 DIAGNOSIS — Z7982 Long term (current) use of aspirin: Secondary | ICD-10-CM | POA: Insufficient documentation

## 2016-05-03 DIAGNOSIS — E039 Hypothyroidism, unspecified: Secondary | ICD-10-CM | POA: Insufficient documentation

## 2016-05-03 DIAGNOSIS — E119 Type 2 diabetes mellitus without complications: Secondary | ICD-10-CM | POA: Insufficient documentation

## 2016-05-03 DIAGNOSIS — Z79899 Other long term (current) drug therapy: Secondary | ICD-10-CM | POA: Insufficient documentation

## 2016-05-03 HISTORY — DX: Disorder of thyroid, unspecified: E07.9

## 2016-05-03 LAB — CBC
HEMATOCRIT: 38.1 % (ref 36.0–46.0)
HEMOGLOBIN: 12.6 g/dL (ref 12.0–15.0)
MCH: 29.7 pg (ref 26.0–34.0)
MCHC: 33.1 g/dL (ref 30.0–36.0)
MCV: 89.9 fL (ref 78.0–100.0)
Platelets: 301 10*3/uL (ref 150–400)
RBC: 4.24 MIL/uL (ref 3.87–5.11)
RDW: 12.9 % (ref 11.5–15.5)
WBC: 5.8 10*3/uL (ref 4.0–10.5)

## 2016-05-03 LAB — BASIC METABOLIC PANEL
ANION GAP: 10 (ref 5–15)
BUN: 22 mg/dL — ABNORMAL HIGH (ref 6–20)
CALCIUM: 9.5 mg/dL (ref 8.9–10.3)
CO2: 27 mmol/L (ref 22–32)
Chloride: 101 mmol/L (ref 101–111)
Creatinine, Ser: 1.29 mg/dL — ABNORMAL HIGH (ref 0.44–1.00)
GFR calc non Af Amer: 49 mL/min — ABNORMAL LOW (ref >60)
GFR, EST AFRICAN AMERICAN: 56 mL/min — AB (ref >60)
Glucose, Bld: 97 mg/dL (ref 65–99)
POTASSIUM: 3.8 mmol/L (ref 3.5–5.1)
Sodium: 138 mmol/L (ref 135–145)

## 2016-05-03 LAB — CBG MONITORING, ED
Comment 1: 20171065
GLUCOSE-CAPILLARY: 82 mg/dL (ref 65–99)

## 2016-05-03 LAB — TROPONIN I: Troponin I: <0.03 ng/mL (ref <0.03)

## 2016-05-03 MED ORDER — GI COCKTAIL ~~LOC~~
30.0000 mL | Freq: Once | ORAL | Status: AC
  Administered 2016-05-03: 30 mL via ORAL
  Filled 2016-05-03: qty 30

## 2016-05-03 MED ORDER — ASPIRIN 81 MG PO CHEW
243.0000 mg | CHEWABLE_TABLET | Freq: Once | ORAL | Status: AC
  Administered 2016-05-03: 243 mg via ORAL

## 2016-05-03 MED ORDER — ASPIRIN 81 MG PO CHEW
324.0000 mg | CHEWABLE_TABLET | Freq: Once | ORAL | Status: DC
  Filled 2016-05-03: qty 4

## 2016-05-03 MED ORDER — AZITHROMYCIN 250 MG PO TABS: 250.0000 mg | ORAL_TABLET | Freq: Every day | ORAL | 0 refills | Status: DC

## 2016-05-03 NOTE — ED Notes (Signed)
ED Provider at bedside. 

## 2016-05-03 NOTE — ED Provider Notes (Signed)
[facility] Provider Note   CSN: [id number] Arrival date & time: 05/03/16  1519     History   Chief Complaint Chief Complaint  Patient presents with  . Chest Pain    HPI Linda Phelps is a 45 y.o. female.  45 yo F with a chief complaint of chest pain. This feels like indigestion. Feels that radiates into her back. Denies any other radiation. Started this morning when she took some medications. Has been mildly worse with exertion. She denies shortness of breath diaphoresis nausea or vomiting. Nothing seems to make this better. She has had some palpitations mostly at night when she lays back flat. She feels that she really needs to belch.   The history is provided by the patient.  Chest Pain   This is a new problem. The current episode started less than 1 hour ago. The problem occurs constantly. The problem has not changed since onset.The pain is at a severity of 7/10. The pain is moderate. Duration of episode(s) is 8 hours. Pertinent negatives include no dizziness, no fever, no headaches, no nausea, no palpitations, no shortness of breath and no vomiting. She has tried nothing for the symptoms. The treatment provided no relief.  Her past medical history is significant for diabetes, hyperlipidemia and hypertension.  Pertinent negatives for past medical history include no MI and no PE.  Pertinent negatives for family medical history include: no early MI.    Past Medical History:  Diagnosis Date  . CHF (congestive heart failure) ([place])   . Diabetes mellitus without complication ([place])   . Ectopic pregnancy   . High cholesterol   . Hypertension   . Obesity   . Thyroid disease     Patient Active Problem List   Diagnosis Date Noted  . Vitamin D insufficiency 01/13/2016  . Menstrual cycle problem 01/11/2016  . Hordeolum externum (stye) 01/11/2016  . Obesity (BMI 30.0-34.9) 10/01/2015  . Palpitations 07/26/2015  . GERD (gastroesophageal reflux disease) 07/26/2015  .  Generalized anxiety disorder 07/26/2015  . Poor dentition 04/13/2015  . Cardiomegaly 09/23/2014  . Diabetes type 2, controlled ([place]) 07/03/2014  . Essential hypertension 07/03/2014  . Hyperlipidemia 07/03/2014  . Hypothyroidism 07/03/2014    Past Surgical History:  Procedure Laterality Date  . APPENDECTOMY    . CHOLECYSTECTOMY      OB History    No data available       Home Medications    Prior to Admission medications   Medication Sig Start Date End Date Taking? Authorizing Provider  amLODipine (NORVASC) 10 MG tablet TAKE 1 TABLET BY MOUTH DAILY. 04/18/16  Yes Boykin Nearing, MD  aspirin 81 MG tablet Take 81 mg by mouth daily.   Yes Historical Provider, MD  carvedilol (COREG) 12.5 MG tablet Take 1 tablet (12.5 mg total) by mouth 2 (two) times daily with a meal. 12/28/15  Yes Josalyn Funches, MD  furosemide (LASIX) 20 MG tablet TAKE 1 TABLET BY MOUTH 2 TIMES DAILY. 04/18/16  Yes Josalyn Funches, MD  levothyroxine (SYNTHROID, LEVOTHROID) 50 MCG tablet TAKE 1 TABLET BY MOUTH DAILY BEFORE BREAKFAST 01/07/16  Yes Josalyn Funches, MD  lisinopril (PRINIVIL,ZESTRIL) 20 MG tablet TAKE 1 TABLET BY MOUTH DAILY. 04/18/16  Yes Josalyn Funches, MD  metFORMIN (GLUCOPHAGE XR) 500 MG 24 hr tablet 500 mg XR in AM, and 1000 mg XR in PM 11/11/15  Yes Josalyn Funches, MD  omeprazole (PRILOSEC) 20 MG capsule Take 1 capsule (20 mg total) by mouth 2 (two) times daily before  a meal. 09/24/15  Yes Josalyn Funches, MD  atorvastatin (LIPITOR) 40 MG tablet Take 1 tablet (40 mg total) by mouth daily. 09/24/15   Josalyn Funches, MD  azithromycin (ZITHROMAX) 250 MG tablet Take 1 tablet (250 mg total) by mouth daily. Take first 2 tablets together, then 1 every day until finished. 05/03/16   Deno Etienne, DO  busPIRone (BUSPAR) 5 MG tablet Take 1 tablet (5 mg total) by mouth 2 (two) times daily. Patient not taking: Reported on 01/11/2016 09/10/15   Boykin Nearing, MD  fluticasone (FLONASE) 50 MCG/ACT nasal spray Place 2  sprays into both nostrils daily. 07/03/14   Lorayne Marek, MD  ketoconazole (NIZORAL) 2 % cream APPLY 1 APPLICATION TOPICALLY DAILY. 12/14/15   Josalyn Funches, MD  Vitamin D, Ergocalciferol, (DRISDOL) 50000 units CAPS capsule Take 1 capsule (50,000 Units total) by mouth every 7 (seven) days. For 8 weeks 01/13/16   Boykin Nearing, MD    Family History Family History  Problem Relation Age of Onset  . Diabetes Mother   . Diabetes Maternal Grandmother   . Colon cancer Maternal Uncle 43    Social History Social History  Substance Use Topics  . Smoking status: Never Smoker  . Smokeless tobacco: Never Used  . Alcohol use 0.0 oz/week     Comment: occ     Allergies   Patient has no known allergies.   Review of Systems Review of Systems  Constitutional: Negative for chills and fever.  HENT: Negative for congestion and rhinorrhea.   Eyes: Negative for redness and visual disturbance.  Respiratory: Negative for shortness of breath and wheezing.   Cardiovascular: Positive for chest pain. Negative for palpitations.  Gastrointestinal: Negative for nausea and vomiting.  Genitourinary: Negative for dysuria and urgency.  Musculoskeletal: Negative for arthralgias and myalgias.  Skin: Negative for pallor and wound.  Neurological: Negative for dizziness and headaches.     Physical Exam Updated Vital Signs BP 111/67   Pulse 70   Temp 98.2 F (36.8 C) (Oral)   Resp 13   Ht 5\' 1"  (1.549 m)   Wt 197 lb (89.4 kg)   LMP 10/08/2013   SpO2 96%   BMI 37.22 kg/m   Physical Exam  Constitutional: She is oriented to person, place, and time. She appears well-developed and well-nourished. No distress.  HENT:  Head: Normocephalic and atraumatic.  Eyes: EOM are normal. Pupils are equal, round, and reactive to light.  Neck: Normal range of motion. Neck supple.  Cardiovascular: Normal rate and regular rhythm.  Exam reveals no gallop and no friction rub.   No murmur heard. Pulmonary/Chest:  Effort normal. She has no wheezes. She has no rales.  Abdominal: Soft. She exhibits no distension and no mass. There is no tenderness. There is no guarding.  Musculoskeletal: She exhibits no edema or tenderness.  Neurological: She is alert and oriented to person, place, and time.  Skin: Skin is warm and dry. She is not diaphoretic.  Psychiatric: She has a normal mood and affect. Her behavior is normal.  Nursing note and vitals reviewed.    ED Treatments / Results  Labs (all labs ordered are listed, but only abnormal results are displayed) Labs Reviewed  BASIC METABOLIC PANEL - Abnormal; Notable for the following:       Result Value   BUN 22 (*)    Creatinine, Ser 1.29 (*)    GFR calc non Af Amer 49 (*)    GFR calc Af Amer 56 (*)  All other components within normal limits  CBC  TROPONIN I  TROPONIN I  CBG MONITORING, ED    EKG  EKG Interpretation  Date/Time:  Wednesday May 03 2016 15:37:03 EST Ventricular Rate:  71 PR Interval:  168 QRS Duration: 92 QT Interval:  400 QTC Calculation: 434 R Axis:   66 Text Interpretation:  Normal sinus rhythm with sinus arrhythmia Normal ECG No significant change since last tracing Confirmed by Abubakr Wieman MD, Quillian Quince [phone]) on 05/03/2016 3:54:07 PM       Radiology Dg Chest 2 View  Result Date: 05/03/2016 CLINICAL DATA:  Chest pain for the past day. History of gastroesophageal reflux, hypertension, hyperlipidemia, diabetes. EXAM: CHEST  2 VIEW COMPARISON:  PA and lateral chest x-ray of July 18, 2015 FINDINGS: The lungs are adequately inflated. The lung markings are coarse in the right upper lobe. There is no pleural effusion or pneumothorax. The heart and pulmonary vascularity are normal. The mediastinum is normal in width. IMPRESSION: Subtle increased density in the right suprahilar region may reflect atelectasis or early pneumonia. Followup PA and lateral chest X-ray is recommended in 3-4 weeks following trial of antibiotic therapy  to ensure resolution and exclude underlying malignancy. Electronically Signed   By: David  Martinique M.D.   On: 05/03/2016 16:23    Procedures Procedures (including critical care time)  Medications Ordered in ED Medications  gi cocktail (Maalox,Lidocaine,Donnatal) (30 mLs Oral Given 05/03/16 1624)  aspirin chewable tablet 243 mg (243 mg Oral Given 05/03/16 1624)     Initial Impression / Assessment and Plan / ED Course  I have reviewed the triage vital signs and the nursing notes.  Pertinent labs & imaging results that were available during my care of the patient were reviewed by me and considered in my medical decision making (see chart for details).  Clinical Course     45 yo F With a chief complaint of chest pain. I feel that the patient is likely low risk and will obtain a delta troponin. EKG with no significant changes from baseline.  Delta trop negative.   11:06 PM:  I have discussed the diagnosis/risks/treatment options with the patient and family and believe the pt to be eligible for discharge home to follow-up with PCP, cards. We also discussed returning to the ED immediately if new or worsening sx occur. We discussed the sx which are most concerning (e.g., sudden worsening pain, fever, inability to tolerate by mouth) that necessitate immediate return. Medications administered to the patient during their visit and any new prescriptions provided to the patient are listed below.  Medications given during this visit Medications  gi cocktail (Maalox,Lidocaine,Donnatal) (30 mLs Oral Given 05/03/16 1624)  aspirin chewable tablet 243 mg (243 mg Oral Given 05/03/16 1624)     The patient appears reasonably screen and/or stabilized for discharge and I doubt any other medical condition or other [facility] requiring further screening, evaluation, or treatment in the ED at this time prior to discharge.    Final Clinical Impressions(s) / ED Diagnoses   Final diagnoses:  Nonspecific chest pain     New Prescriptions Discharge Medication List as of 05/03/2016 10:10 PM       Deno Etienne, DO 05/03/16 [id number]

## 2016-05-03 NOTE — Discharge Instructions (Signed)
Try zantac 150mg twice a day.  ° ° °

## 2016-05-03 NOTE — ED Notes (Signed)
Patient reports she has an appointment with Dr Olena Heckle (Cardiology) tomorrow for palpitations she has been having at night time.

## 2016-05-03 NOTE — ED Triage Notes (Signed)
Patient reports a feeling "that's something's not right" in her chest, indicates her discomfort is from her throat down into her chest. Patient also c/o low to mid thoracic back pain. Patient has hx of acid reflux which she takes Prilosec for. Patient states her pain started after taking her morning medications, denies any difficulty in taking her meds.

## 2016-05-09 MED FILL — ?AZITHROMYCIN 250 MG TABLET: 250 | 5 days supply | Qty: 6 | Fill #0

## 2016-05-17 ENCOUNTER — Other Ambulatory Visit: Payer: Self-pay | Admitting: Family Medicine

## 2016-05-17 MED FILL — AMLODIPINE BESYLATE 10 MG T: 10 | 30 days supply | Qty: 30 | Fill #0

## 2016-05-18 ENCOUNTER — Other Ambulatory Visit: Payer: Self-pay | Admitting: Family Medicine

## 2016-05-18 ENCOUNTER — Ambulatory Visit: Payer: Self-pay | Attending: Family Medicine | Admitting: Family Medicine

## 2016-05-18 ENCOUNTER — Encounter: Payer: Self-pay | Admitting: Family Medicine

## 2016-05-18 VITALS — BP 144/83 | HR 73 | Temp 97.5°F | Ht 61.0 in | Wt 191.2 lb

## 2016-05-18 DIAGNOSIS — Z7982 Long term (current) use of aspirin: Secondary | ICD-10-CM | POA: Insufficient documentation

## 2016-05-18 DIAGNOSIS — E039 Hypothyroidism, unspecified: Secondary | ICD-10-CM | POA: Insufficient documentation

## 2016-05-18 DIAGNOSIS — E669 Obesity, unspecified: Secondary | ICD-10-CM | POA: Insufficient documentation

## 2016-05-18 DIAGNOSIS — K219 Gastro-esophageal reflux disease without esophagitis: Secondary | ICD-10-CM | POA: Insufficient documentation

## 2016-05-18 DIAGNOSIS — I10 Essential (primary) hypertension: Secondary | ICD-10-CM | POA: Insufficient documentation

## 2016-05-18 DIAGNOSIS — Z6836 Body mass index (BMI) 36.0-36.9, adult: Secondary | ICD-10-CM | POA: Insufficient documentation

## 2016-05-18 DIAGNOSIS — R9389 Abnormal findings on diagnostic imaging of other specified body structures: Secondary | ICD-10-CM

## 2016-05-18 DIAGNOSIS — Z7984 Long term (current) use of oral hypoglycemic drugs: Secondary | ICD-10-CM | POA: Insufficient documentation

## 2016-05-18 DIAGNOSIS — R938 Abnormal findings on diagnostic imaging of other specified body structures: Secondary | ICD-10-CM | POA: Insufficient documentation

## 2016-05-18 DIAGNOSIS — R51 Headache: Secondary | ICD-10-CM | POA: Insufficient documentation

## 2016-05-18 DIAGNOSIS — E559 Vitamin D deficiency, unspecified: Secondary | ICD-10-CM | POA: Insufficient documentation

## 2016-05-18 DIAGNOSIS — E119 Type 2 diabetes mellitus without complications: Secondary | ICD-10-CM | POA: Insufficient documentation

## 2016-05-18 DIAGNOSIS — Z79899 Other long term (current) drug therapy: Secondary | ICD-10-CM | POA: Insufficient documentation

## 2016-05-18 DIAGNOSIS — G44209 Tension-type headache, unspecified, not intractable: Secondary | ICD-10-CM | POA: Insufficient documentation

## 2016-05-18 LAB — GLUCOSE, POCT (MANUAL RESULT ENTRY): POC Glucose: 85 mg/dl (ref 70–99)

## 2016-05-18 LAB — TSH: TSH: 1.12 mIU/L

## 2016-05-18 MED ORDER — RANITIDINE HCL 300 MG PO TABS: 300.0000 mg | ORAL_TABLET | Freq: Every day | ORAL | 5 refills | Status: DC

## 2016-05-18 MED ORDER — METFORMIN HCL ER 500 MG PO TB24: 500.0000 mg | ORAL_TABLET | Freq: Two times a day (BID) | ORAL | 5 refills | Status: DC

## 2016-05-18 MED ORDER — CARVEDILOL 25 MG PO TABS: 25.0000 mg | ORAL_TABLET | Freq: Two times a day (BID) | ORAL | 5 refills | Status: DC

## 2016-05-18 MED ORDER — AMOXICILLIN 500 MG PO CAPS: 500.0000 mg | ORAL_CAPSULE | Freq: Three times a day (TID) | ORAL | 0 refills | Status: DC

## 2016-05-18 MED ORDER — AMOXICILLIN 875 MG PO TABS: 875.0000 mg | ORAL_TABLET | Freq: Two times a day (BID) | ORAL | 0 refills | Status: DC

## 2016-05-18 MED FILL — METFORMIN HCL ER 500 MG TAB: 500 | 30 days supply | Qty: 90 | Fill #5

## 2016-05-18 NOTE — Progress Notes (Signed)
Pt is having back pain, neck pain , headaches, dizziness, and chest pain.  Pt needs refills on medications.

## 2016-05-18 NOTE — Assessment & Plan Note (Signed)
Suspect tension or sinusitis Course of amoxicillin

## 2016-05-18 NOTE — Patient Instructions (Addendum)
Linda Phelps was seen today for follow-up.  Diagnoses and all orders for this visit:  Controlled type 2 diabetes mellitus without complication, without long-term current use of insulin (HCC) -     POCT glucose (manual entry) -     Discontinue: metFORMIN (GLUCOPHAGE XR) 500 MG 24 hr tablet; Take 1 tablet (500 mg total) by mouth 2 (two) times daily. -     metFORMIN (GLUCOPHAGE XR) 500 MG 24 hr tablet; Take 1 tablet (500 mg total) by mouth 2 (two) times daily.  Gastroesophageal reflux disease, esophagitis presence not specified -     Discontinue: ranitidine (ZANTAC) 300 MG tablet; Take 1 tablet (300 mg total) by mouth at bedtime. -     ranitidine (ZANTAC) 300 MG tablet; Take 1 tablet (300 mg total) by mouth at bedtime.  Obesity (BMI 30.0-34.9) -     Discontinue: metFORMIN (GLUCOPHAGE XR) 500 MG 24 hr tablet; Take 1 tablet (500 mg total) by mouth 2 (two) times daily. -     metFORMIN (GLUCOPHAGE XR) 500 MG 24 hr tablet; Take 1 tablet (500 mg total) by mouth 2 (two) times daily.  Hypothyroidism, unspecified type -     TSH  Essential hypertension -     BASIC METABOLIC PANEL WITH GFR -     carvedilol (COREG) 25 MG tablet; Take 1 tablet (25 mg total) by mouth 2 (two) times daily with a meal.  Vitamin D insufficiency -     Vitamin D, 25-hydroxy  Acute non intractable tension-type headache -     Discontinue: amoxicillin (AMOXIL) 875 MG tablet; Take 1 tablet (875 mg total) by mouth 2 (two) times daily. -     amoxicillin (AMOXIL) 500 MG capsule; Take 1 capsule (500 mg total) by mouth 3 (three) times daily.  Abnormal chest x-ray -     DG Chest 2 View; Future  wait to have repeat chest x-ray done until 1/3-1/02/2016   F/u in 3-4 weeks for pap smear   Dr. Adrian Blackwater

## 2016-05-18 NOTE — Assessment & Plan Note (Signed)
Elevated with palpitations  Increase coreg to 25 mg BID

## 2016-05-18 NOTE — Progress Notes (Signed)
Subjective:  Patient ID: Linda Phelps, female    DOB: 1971/04/12  Age: 45 y.o. MRN: [id number]  CC: Follow-up   HPI Loistine Eberlin presents for    1. ED f/u chest pain: she was seen in ED on 05/03/16 for chest pain. EKG and troponin x 2 were negative for ACS. She had a chest x-ray done that revealed a density in the right suprahilar region that my reflect atelectasis or early pneumonia. She was treated with a 5 day course of azithromycin. She denies chest pain or fever. She has palpitations.  2. HTN: compliant with regimen. At times takes an extra 5 mg of amlodipine in the mornings. She is not compliant with low salt diet.   3. Diabetes: having CBGs in 101s. Eating less. Working to lose weight but not eating very healthy or exercising.   4. Headaches: b/l temples. With congestion. Some coughing. No fever or  Chills.   Social History  Substance Use Topics  . Smoking status: Never Smoker  . Smokeless tobacco: Never Used  . Alcohol use 0.0 oz/week     Comment: occ    Outpatient Medications Prior to Visit  Medication Sig Dispense Refill  . amLODipine (NORVASC) 10 MG tablet TAKE 1 TABLET BY MOUTH DAILY. 30 tablet 0  . aspirin 81 MG tablet Take 81 mg by mouth daily.    . carvedilol (COREG) 12.5 MG tablet Take 1 tablet (12.5 mg total) by mouth 2 (two) times daily with a meal. 60 tablet 3  . fluticasone (FLONASE) 50 MCG/ACT nasal spray Place 2 sprays into both nostrils daily. 16 g 6  . furosemide (LASIX) 20 MG tablet TAKE 1 TABLET BY MOUTH 2 TIMES DAILY. 60 tablet 0  . ketoconazole (NIZORAL) 2 % cream APPLY 1 APPLICATION TOPICALLY DAILY. 60 g 0  . levothyroxine (SYNTHROID, LEVOTHROID) 50 MCG tablet TAKE 1 TABLET BY MOUTH DAILY BEFORE BREAKFAST 30 tablet 3  . lisinopril (PRINIVIL,ZESTRIL) 20 MG tablet TAKE 1 TABLET BY MOUTH DAILY. 30 tablet 0  . metFORMIN (GLUCOPHAGE XR) 500 MG 24 hr tablet 500 mg XR in AM, and 1000 mg XR in PM 90 tablet 5  . Vitamin D, Ergocalciferol, (DRISDOL) 50000 units  CAPS capsule Take 1 capsule (50,000 Units total) by mouth every 7 (seven) days. For 8 weeks 8 capsule 0  . atorvastatin (LIPITOR) 40 MG tablet Take 1 tablet (40 mg total) by mouth daily. (Patient not taking: Reported on 05/18/2016) 90 tablet 3  . azithromycin (ZITHROMAX) 250 MG tablet Take 1 tablet (250 mg total) by mouth daily. Take first 2 tablets together, then 1 every day until finished. (Patient not taking: Reported on 05/18/2016) 6 tablet 0  . busPIRone (BUSPAR) 5 MG tablet Take 1 tablet (5 mg total) by mouth 2 (two) times daily. (Patient not taking: Reported on 05/18/2016) 60 tablet 1  . omeprazole (PRILOSEC) 20 MG capsule Take 1 capsule (20 mg total) by mouth 2 (two) times daily before a meal. (Patient not taking: Reported on 05/18/2016) 60 capsule 3   No facility-administered medications prior to visit.     ROS Review of Systems  Constitutional: Negative for chills and fever.  Eyes: Negative for visual disturbance.  Respiratory: Negative for shortness of breath.   Cardiovascular: Negative for chest pain.  Gastrointestinal: Negative for abdominal pain and blood in stool.  Musculoskeletal: Positive for back pain and neck pain. Negative for arthralgias.  Skin: Negative for rash.  Allergic/Immunologic: Negative for immunocompromised state.  Neurological: Positive for dizziness and  headaches.  Hematological: Negative for adenopathy. Does not bruise/bleed easily.  Psychiatric/Behavioral: Negative for dysphoric mood and suicidal ideas.    Objective:  BP (!) 144/83 (BP Location: Left Arm, Patient Position: Sitting, Cuff Size: Large)   Pulse 73   Temp 97.5 F (36.4 C) (Oral)   Ht 5\' 1"  (1.549 m)   Wt 191 lb 3.2 oz (86.7 kg)   LMP 10/08/2013   SpO2 100%   BMI 36.13 kg/m   BP/Weight 05/18/2016 05/03/2016 11/29/6267  Systolic BP 485 462 703  Diastolic BP 83 67 84  Wt. (Lbs) 191.2 197 197.6  BMI 36.13 37.22 37.34    Physical Exam  Constitutional: She is oriented to person,  place, and time. She appears well-developed and well-nourished. No distress.  Obese   HENT:  Head: Normocephalic and atraumatic.  Nose: Mucosal edema present.  Cardiovascular: Normal rate, regular rhythm, normal heart sounds and intact distal pulses.   Pulmonary/Chest: Effort normal and breath sounds normal.  Musculoskeletal: She exhibits no edema.  Neurological: She is alert and oriented to person, place, and time.  Skin: Skin is warm and dry. No rash noted.  Psychiatric: She has a normal mood and affect.   Lab Results  Component Value Date   HGBA1C 6.5 01/11/2016   CBG 85   Assessment & Plan:   Adaline was seen today for follow-up.  Diagnoses and all orders for this visit:  Controlled type 2 diabetes mellitus without complication, without long-term current use of insulin (HCC) -     POCT glucose (manual entry) -     Discontinue: metFORMIN (GLUCOPHAGE XR) 500 MG 24 hr tablet; Take 1 tablet (500 mg total) by mouth 2 (two) times daily. -     metFORMIN (GLUCOPHAGE XR) 500 MG 24 hr tablet; Take 1 tablet (500 mg total) by mouth 2 (two) times daily.  Gastroesophageal reflux disease, esophagitis presence not specified -     Discontinue: ranitidine (ZANTAC) 300 MG tablet; Take 1 tablet (300 mg total) by mouth at bedtime. -     ranitidine (ZANTAC) 300 MG tablet; Take 1 tablet (300 mg total) by mouth at bedtime.  Obesity (BMI 30.0-34.9) -     Discontinue: metFORMIN (GLUCOPHAGE XR) 500 MG 24 hr tablet; Take 1 tablet (500 mg total) by mouth 2 (two) times daily. -     metFORMIN (GLUCOPHAGE XR) 500 MG 24 hr tablet; Take 1 tablet (500 mg total) by mouth 2 (two) times daily.  Hypothyroidism, unspecified type -     TSH  Essential hypertension -     BASIC METABOLIC PANEL WITH GFR -     carvedilol (COREG) 25 MG tablet; Take 1 tablet (25 mg total) by mouth 2 (two) times daily with a meal.  Vitamin D insufficiency -     Vitamin D, 25-hydroxy  Acute non intractable tension-type headache -      Discontinue: amoxicillin (AMOXIL) 875 MG tablet; Take 1 tablet (875 mg total) by mouth 2 (two) times daily. -     amoxicillin (AMOXIL) 500 MG capsule; Take 1 capsule (500 mg total) by mouth 3 (three) times daily.  Abnormal chest x-ray -     DG Chest 2 View; Future    No orders of the defined types were placed in this encounter.   Follow-up: Return in about 4 weeks (around 06/15/2016) for pap smear .   Boykin Nearing MD

## 2016-05-18 NOTE — Assessment & Plan Note (Signed)
Treated with azithromycin F/u repeat CXR ordered between 05/25/2015-06/01/2015

## 2016-05-19 ENCOUNTER — Other Ambulatory Visit: Payer: Self-pay | Admitting: Family Medicine

## 2016-05-19 ENCOUNTER — Other Ambulatory Visit: Payer: Self-pay

## 2016-05-19 ENCOUNTER — Telehealth: Payer: Self-pay

## 2016-05-19 DIAGNOSIS — I10 Essential (primary) hypertension: Secondary | ICD-10-CM

## 2016-05-19 LAB — BASIC METABOLIC PANEL WITH GFR
BUN: 18 mg/dL (ref 7–25)
CALCIUM: 9.1 mg/dL (ref 8.6–10.2)
CHLORIDE: 103 mmol/L (ref 98–110)
CO2: 29 mmol/L (ref 20–31)
CREATININE: 1.35 mg/dL — AB (ref 0.50–1.10)
GFR, Est African American: 54 mL/min — ABNORMAL LOW (ref >=60)
GFR, Est Non African American: 47 mL/min — ABNORMAL LOW (ref >=60)
Glucose, Bld: 88 mg/dL (ref 65–99)
Potassium: 4 mmol/L (ref 3.5–5.3)
Sodium: 141 mmol/L (ref 135–146)

## 2016-05-19 LAB — VITAMIN D 25 HYDROXY (VIT D DEFICIENCY, FRACTURES): VIT D 25 HYDROXY: 23 ng/mL — AB (ref 30–100)

## 2016-05-19 MED ORDER — LISINOPRIL 20 MG PO TABS: 20.0000 mg | ORAL_TABLET | Freq: Every day | ORAL | 0 refills | Status: DC

## 2016-05-19 MED ORDER — VITAMIN D (ERGOCALCIFEROL) 1.25 MG (50000 UNIT) PO CAPS: 50000.0000 [IU] | ORAL_CAPSULE | ORAL | 3 refills | Status: DC

## 2016-05-19 MED FILL — raNITIdine HCL 300 MG TABS: 300 | 30 days supply | Qty: 30 | Fill #0

## 2016-05-19 MED FILL — VIT D2 1.25 MG (50,000 UNIT: 1.25 MG | 84 days supply | Qty: 12 | Fill #0

## 2016-05-19 MED FILL — AMOXICILLIN 500 MG CAPSULE: 500 | 10 days supply | Qty: 30 | Fill #0

## 2016-05-19 NOTE — Addendum Note (Signed)
Addended by: Boykin Nearing on: 05/19/2016 08:41 AM   Modules accepted: Orders

## 2016-05-19 NOTE — Telephone Encounter (Signed)
Pt was called and a VM was left informing pt to return phone call for lab results.

## 2016-05-23 ENCOUNTER — Encounter (HOSPITAL_BASED_OUTPATIENT_CLINIC_OR_DEPARTMENT_OTHER): Payer: Self-pay | Admitting: *Deleted

## 2016-05-23 ENCOUNTER — Emergency Department (HOSPITAL_BASED_OUTPATIENT_CLINIC_OR_DEPARTMENT_OTHER)
Admission: EM | Admit: 2016-05-23 | Discharge: 2016-05-24 | Disposition: A | Discharge status: Home / Self Care | Payer: Self-pay | Attending: Emergency Medicine | Admitting: Emergency Medicine

## 2016-05-23 DIAGNOSIS — E119 Type 2 diabetes mellitus without complications: Secondary | ICD-10-CM | POA: Insufficient documentation

## 2016-05-23 DIAGNOSIS — I110 Hypertensive heart disease with heart failure: Secondary | ICD-10-CM | POA: Insufficient documentation

## 2016-05-23 DIAGNOSIS — M62830 Muscle spasm of back: Secondary | ICD-10-CM | POA: Insufficient documentation

## 2016-05-23 DIAGNOSIS — M545 Low back pain, unspecified: Secondary | ICD-10-CM

## 2016-05-23 DIAGNOSIS — E039 Hypothyroidism, unspecified: Secondary | ICD-10-CM | POA: Insufficient documentation

## 2016-05-23 DIAGNOSIS — R42 Dizziness and giddiness: Secondary | ICD-10-CM | POA: Insufficient documentation

## 2016-05-23 DIAGNOSIS — Z79899 Other long term (current) drug therapy: Secondary | ICD-10-CM | POA: Insufficient documentation

## 2016-05-23 DIAGNOSIS — I509 Heart failure, unspecified: Secondary | ICD-10-CM | POA: Insufficient documentation

## 2016-05-23 DIAGNOSIS — Z7982 Long term (current) use of aspirin: Secondary | ICD-10-CM | POA: Insufficient documentation

## 2016-05-23 LAB — URINALYSIS, ROUTINE W REFLEX MICROSCOPIC
Bilirubin Urine: NEGATIVE
GLUCOSE, UA: NEGATIVE mg/dL
Hgb urine dipstick: NEGATIVE
KETONES UR: NEGATIVE mg/dL
LEUKOCYTES UA: NEGATIVE
Nitrite: NEGATIVE
PH: 5.5 (ref 5.0–8.0)
Protein, ur: 30 mg/dL — AB
Specific Gravity, Urine: 1.012 (ref 1.005–1.030)

## 2016-05-23 LAB — URINALYSIS, MICROSCOPIC (REFLEX)
RBC / HPF: NONE SEEN RBC/hpf (ref 0–5)
WBC, UA: NONE SEEN WBC/hpf (ref 0–5)

## 2016-05-23 MED ORDER — METHOCARBAMOL 500 MG PO TABS
500.0000 mg | ORAL_TABLET | Freq: Two times a day (BID) | ORAL | 0 refills | Status: DC
Start: 2016-05-23 — End: 2016-07-11

## 2016-05-23 MED ORDER — METHOCARBAMOL 500 MG PO TABS
1000.0000 mg | ORAL_TABLET | Freq: Once | ORAL | Status: AC
  Administered 2016-05-24: 1000 mg via ORAL
  Filled 2016-05-23: qty 2

## 2016-05-23 NOTE — ED Notes (Signed)
Pt now endorses that she has been on amoxicillin for URI as well.

## 2016-05-23 NOTE — ED Notes (Signed)
Pt reports back pain x 2 weeks. Pt states she was seen at her PCP for it and they told her she had some decreased renal function. Pt states that she came to the ED today because she had an episode of dizziness after going from lying to standing. Pt states the dizziness only lasted a couple of minutes.

## 2016-05-23 NOTE — Progress Notes (Signed)
Call to patient received message stating "voicemail has not been set-up yet".

## 2016-05-23 NOTE — ED Provider Notes (Signed)
[facility] Provider Note   CSN: [id number] Arrival date & time: 05/23/16  1840   By signing my name below, I, Delton Prairie, attest that this documentation has been prepared under the direction and in the presence of Tanna Furry, MD  Electronically Signed: Delton Prairie, ED Scribe. 05/23/16. 11:51 PM.   History   Chief Complaint Chief Complaint  Patient presents with  . Back Pain   The history is provided by the patient. No language interpreter was used.   HPI Comments:  Sheriece Jefcoat is a 46 y.o. female, with a hx of DM, HTN, CHF, high cholesterol and renal decline, who presents to the Emergency Department complaining of sudden onset, persistent lower back pain x 1 week. Her pain is worse with prolonged time on her feet. 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 Pt also reports diffuse lower abdominal pain and sudden onset lightheadedness/dizziness after awaking from a nap yesterday. She states she took her BP medication after which her lightheadedness resolved. Pt is on lisinopril, amlodipine, carvedilol (recent dosage change) and metformin. Pt denies nausea, vomiting, abnormal bowel movements, radiation of her pain to her bilateral lower extremity, numbness/weakness of her bilateral lower extremities, a family hx of kidney disease, any other associated symptoms and any other modifying factors at this time.   PCP: Minerva Ends, MD  Past Medical History:  Diagnosis Date  . CHF (congestive heart failure) ([place])   . Diabetes mellitus without complication ([place])   . Ectopic pregnancy   . High cholesterol   . Hypertension   . Obesity   . Thyroid disease     Patient Active Problem List   Diagnosis Date Noted  . Acute non intractable tension-type headache 05/18/2016  . Abnormal chest x-ray 05/18/2016  . Vitamin D insufficiency 01/13/2016  . Menstrual cycle problem 01/11/2016  . Hordeolum externum (stye) 01/11/2016  . Obesity (BMI 30.0-34.9) 10/01/2015  .  Palpitations 07/26/2015  . GERD (gastroesophageal reflux disease) 07/26/2015  . Generalized anxiety disorder 07/26/2015  . Poor dentition 04/13/2015  . Cardiomegaly 09/23/2014  . Diabetes type 2, controlled ([place]) 07/03/2014  . Essential hypertension 07/03/2014  . Hyperlipidemia 07/03/2014  . Hypothyroidism 07/03/2014    Past Surgical History:  Procedure Laterality Date  . APPENDECTOMY    . CHOLECYSTECTOMY      OB History    No data available       Home Medications    Prior to Admission medications   Medication Sig Start Date End Date Taking? Authorizing Provider  amLODipine (NORVASC) 10 MG tablet TAKE 1 TABLET BY MOUTH DAILY. 05/17/16   Josalyn Funches, MD  amoxicillin (AMOXIL) 500 MG capsule Take 1 capsule (500 mg total) by mouth 3 (three) times daily. 05/18/16   Boykin Nearing, MD  aspirin 81 MG tablet Take 81 mg by mouth daily.    Historical Provider, MD  carvedilol (COREG) 25 MG tablet Take 1 tablet (25 mg total) by mouth 2 (two) times daily with a meal. 05/18/16   Josalyn Funches, MD  fluticasone (FLONASE) 50 MCG/ACT nasal spray Place 2 sprays into both nostrils daily. 07/03/14   Lorayne Marek, MD  furosemide (LASIX) 20 MG tablet TAKE 1 TABLET BY MOUTH 2 TIMES DAILY. 05/19/16   Josalyn Funches, MD  ketoconazole (NIZORAL) 2 % cream APPLY 1 APPLICATION TOPICALLY DAILY. 12/14/15   Josalyn Funches, MD  levothyroxine (SYNTHROID, LEVOTHROID) 50 MCG tablet TAKE 1 TABLET BY MOUTH DAILY BEFORE BREAKFAST 05/19/16   Boykin Nearing, MD  lisinopril (PRINIVIL,ZESTRIL)  20 MG tablet Take 1 tablet (20 mg total) by mouth daily. 05/19/16   Josalyn Funches, MD  metFORMIN (GLUCOPHAGE XR) 500 MG 24 hr tablet Take 1 tablet (500 mg total) by mouth 2 (two) times daily. 05/18/16   Josalyn Funches, MD  methocarbamol (ROBAXIN) 500 MG tablet Take 1 tablet (500 mg total) by mouth 2 (two) times daily. 05/23/16   Tanna Furry, MD  ranitidine (ZANTAC) 300 MG tablet Take 1 tablet (300 mg total) by mouth at  bedtime. 05/18/16   Josalyn Funches, MD  Vitamin D, Ergocalciferol, (DRISDOL) 50000 units CAPS capsule Take 1 capsule (50,000 Units total) by mouth every 30 (thirty) days. For 8 weeks 05/19/16   Boykin Nearing, MD    Family History Family History  Problem Relation Age of Onset  . Diabetes Mother   . Diabetes Maternal Grandmother   . Colon cancer Maternal Uncle 52    Social History Social History  Substance Use Topics  . Smoking status: Never Smoker  . Smokeless tobacco: Never Used  . Alcohol use 0.0 oz/week     Comment: occ     Allergies   Patient has no known allergies.   Review of Systems Review of Systems  Constitutional: Negative for appetite change, chills, diaphoresis, fatigue and fever.  HENT: Negative for mouth sores, sore throat and trouble swallowing.   Eyes: Negative for visual disturbance.  Respiratory: Negative for cough, chest tightness, shortness of breath and wheezing.   Cardiovascular: Negative for chest pain.  Gastrointestinal: Negative for abdominal distention, abdominal pain, diarrhea, nausea and vomiting.  Endocrine: Negative for polydipsia, polyphagia and polyuria.  Genitourinary: Negative for dysuria, frequency and hematuria.  Musculoskeletal: Positive for back pain. Negative for gait problem.  Skin: Negative for color change, pallor and rash.  Neurological: Positive for dizziness and light-headedness. Negative for syncope, weakness, numbness and headaches.  Hematological: Does not bruise/bleed easily.  Psychiatric/Behavioral: Negative for behavioral problems and confusion.     Physical Exam Updated Vital Signs BP 154/81 (BP Location: Right Arm)   Pulse 66   Temp 98.6 F (37 C) (Oral)   Resp 16   Ht 5\' 1"  (1.549 m)   Wt 191 lb (86.6 kg)   LMP 10/08/2013   SpO2 100%   BMI 36.09 kg/m   Physical Exam  Constitutional: She is oriented to person, place, and time. She appears well-developed and well-nourished. No distress.  HENT:  Head:  Normocephalic.  Eyes: Conjunctivae are normal. Pupils are equal, round, and reactive to light. No scleral icterus.  Neck: Normal range of motion. Neck supple. No thyromegaly present.  Cardiovascular: Normal rate and regular rhythm.  Exam reveals no gallop and no friction rub.   No murmur heard. Pulmonary/Chest: Effort normal and breath sounds normal. No respiratory distress. She has no wheezes. She has no rales.  Abdominal: Soft. Bowel sounds are normal. She exhibits no distension. There is no tenderness. There is no rebound.  Musculoskeletal: Normal range of motion.  Tenderness to the left paraspinal muscles.   Neurological: She is alert and oriented to person, place, and time.  Skin: Skin is warm and dry. No rash noted.  Psychiatric: She has a normal mood and affect. Her behavior is normal.     ED Treatments / Results  DIAGNOSTIC STUDIES:  Oxygen Saturation is 100% on RA, normal by my interpretation.    COORDINATION OF CARE:  11:47 PM Discussed treatment plan with pt at bedside and pt agreed to plan.  Labs (all labs ordered are listed,  but only abnormal results are displayed) Labs Reviewed  URINALYSIS, ROUTINE W REFLEX MICROSCOPIC - Abnormal; Notable for the following:       Result Value   Protein, ur 30 (*)    All other components within normal limits  URINALYSIS, MICROSCOPIC (REFLEX) - Abnormal; Notable for the following:    Bacteria, UA RARE (*)    Squamous Epithelial / LPF 0-5 (*)    All other components within normal limits    EKG  EKG Interpretation None       Radiology No results found.  Procedures Procedures (including critical care time)  Medications Ordered in ED Medications  methocarbamol (ROBAXIN) tablet 1,000 mg (not administered)     Initial Impression / Assessment and Plan / ED Course  I have reviewed the triage vital signs and the nursing notes.  Pertinent labs & imaging results that were available during my care of the patient were  reviewed by me and considered in my medical decision making (see chart for details).  Clinical Course     Not anemic on most recent lab draws. Did have a slight elevation of creatinine 1.35. She was told to avoid anti-inflammatories and to maintain tight control of her blood pressure and diabetes. With this conversation she actually did remember that her Coreg was increased from 12.5 twice a day to 25 twice a day. She is actually been taking 4.5 4 times a day. I expect this is the culprit for her orthostasis yesterday. She was asked to take 20 5 at night only 12 and half in the morning and to check and record her pressures in the follow-up with her physician. We'll give her Robaxin for her intermittent musculoskeletal back pain.  Final Clinical Impressions(s) / ED Diagnoses   Final diagnoses:  Acute bilateral low back pain without sciatica  Dizzy  Bilateral low back pain without sciatica, unspecified chronicity  Muscle spasm of back    New Prescriptions New Prescriptions   METHOCARBAMOL (ROBAXIN) 500 MG TABLET    Take 1 tablet (500 mg total) by mouth 2 (two) times daily.  I personally performed the services described in this documentation, which was scribed in my presence. The recorded information has been reviewed and is accurate.     Tanna Furry, MD 05/23/16 2356

## 2016-05-24 NOTE — ED Notes (Signed)
Pt teaching provided on medications that may cause drowsiness. Pt instructed not to drive or operate heavy machinery while taking the prescribed medication. Pt verbalized understanding.   

## 2016-06-08 ENCOUNTER — Inpatient Hospital Stay: Payer: Self-pay | Admitting: Family Medicine

## 2016-06-08 ENCOUNTER — Other Ambulatory Visit: Payer: Self-pay | Admitting: Family Medicine

## 2016-06-09 ENCOUNTER — Other Ambulatory Visit: Payer: Self-pay | Admitting: Family Medicine

## 2016-06-09 MED FILL — ?LEVOTHYROXINE 50 MCG TABLE: 50 | 30 days supply | Qty: 30 | Fill #0

## 2016-06-12 ENCOUNTER — Other Ambulatory Visit: Payer: Self-pay | Admitting: Family Medicine

## 2016-06-12 DIAGNOSIS — I10 Essential (primary) hypertension: Secondary | ICD-10-CM

## 2016-06-12 DIAGNOSIS — K219 Gastro-esophageal reflux disease without esophagitis: Secondary | ICD-10-CM

## 2016-06-14 ENCOUNTER — Other Ambulatory Visit: Payer: Self-pay | Admitting: Family Medicine

## 2016-06-14 DIAGNOSIS — I10 Essential (primary) hypertension: Secondary | ICD-10-CM

## 2016-06-15 ENCOUNTER — Other Ambulatory Visit: Payer: Self-pay | Admitting: Family Medicine

## 2016-06-15 DIAGNOSIS — I10 Essential (primary) hypertension: Secondary | ICD-10-CM

## 2016-06-19 ENCOUNTER — Inpatient Hospital Stay: Payer: Self-pay | Admitting: Family Medicine

## 2016-06-23 ENCOUNTER — Other Ambulatory Visit: Payer: Self-pay | Admitting: Family Medicine

## 2016-06-23 DIAGNOSIS — I10 Essential (primary) hypertension: Secondary | ICD-10-CM

## 2016-06-29 MED FILL — ?LEVOTHYROXINE 50 MCG TABLE: 50 | 30 days supply | Qty: 30 | Fill #1

## 2016-06-29 MED FILL — LISINOPRIL 20 MG TABLET: 20 | 30 days supply | Qty: 30 | Fill #0

## 2016-06-29 MED FILL — ?FUROSEMIDE 20 MG TABLET: 20 | 30 days supply | Qty: 60 | Fill #0

## 2016-06-29 MED FILL — METFORMIN HCL ER 500 MG TAB: 500 | 30 days supply | Qty: 60 | Fill #0

## 2016-06-29 MED FILL — AMLODIPINE BESYLATE 10 MG T: 10 | 30 days supply | Qty: 30 | Fill #0

## 2016-06-30 ENCOUNTER — Ambulatory Visit: Payer: Self-pay | Admitting: Family Medicine

## 2016-07-11 ENCOUNTER — Encounter: Payer: Self-pay | Admitting: Family Medicine

## 2016-07-11 ENCOUNTER — Ambulatory Visit: Payer: Self-pay | Attending: Family Medicine | Admitting: Family Medicine

## 2016-07-11 VITALS — BP 141/78 | HR 78 | Temp 98.1°F | Ht 61.0 in | Wt 195.8 lb

## 2016-07-11 DIAGNOSIS — G8929 Other chronic pain: Secondary | ICD-10-CM

## 2016-07-11 DIAGNOSIS — E559 Vitamin D deficiency, unspecified: Secondary | ICD-10-CM | POA: Insufficient documentation

## 2016-07-11 DIAGNOSIS — E119 Type 2 diabetes mellitus without complications: Secondary | ICD-10-CM | POA: Insufficient documentation

## 2016-07-11 DIAGNOSIS — M545 Low back pain, unspecified: Secondary | ICD-10-CM

## 2016-07-11 DIAGNOSIS — Z7984 Long term (current) use of oral hypoglycemic drugs: Secondary | ICD-10-CM | POA: Insufficient documentation

## 2016-07-11 DIAGNOSIS — Z7982 Long term (current) use of aspirin: Secondary | ICD-10-CM | POA: Insufficient documentation

## 2016-07-11 DIAGNOSIS — R0981 Nasal congestion: Secondary | ICD-10-CM | POA: Insufficient documentation

## 2016-07-11 DIAGNOSIS — N289 Disorder of kidney and ureter, unspecified: Secondary | ICD-10-CM | POA: Insufficient documentation

## 2016-07-11 DIAGNOSIS — Z6837 Body mass index (BMI) 37.0-37.9, adult: Secondary | ICD-10-CM | POA: Insufficient documentation

## 2016-07-11 DIAGNOSIS — E669 Obesity, unspecified: Secondary | ICD-10-CM | POA: Insufficient documentation

## 2016-07-11 DIAGNOSIS — J0110 Acute frontal sinusitis, unspecified: Secondary | ICD-10-CM | POA: Insufficient documentation

## 2016-07-11 DIAGNOSIS — I10 Essential (primary) hypertension: Secondary | ICD-10-CM | POA: Insufficient documentation

## 2016-07-11 HISTORY — DX: Low back pain, unspecified: M54.50

## 2016-07-11 HISTORY — DX: Other chronic pain: G89.29

## 2016-07-11 LAB — BASIC METABOLIC PANEL WITH GFR
BUN: 18 mg/dL (ref 7–25)
CHLORIDE: 103 mmol/L (ref 98–110)
CO2: 30 mmol/L (ref 20–31)
CREATININE: 1.21 mg/dL — AB (ref 0.50–1.10)
Calcium: 9.3 mg/dL (ref 8.6–10.2)
GFR, Est African American: 62 mL/min (ref >=60)
GFR, Est Non African American: 53 mL/min — ABNORMAL LOW (ref >=60)
Glucose, Bld: 147 mg/dL — ABNORMAL HIGH (ref 65–99)
Potassium: 4.1 mmol/L (ref 3.5–5.3)
Sodium: 140 mmol/L (ref 135–146)

## 2016-07-11 LAB — POCT GLYCOSYLATED HEMOGLOBIN (HGB A1C): Hemoglobin A1C: 6.2

## 2016-07-11 LAB — GLUCOSE, POCT (MANUAL RESULT ENTRY): POC Glucose: 193 mg/dl — AB (ref 70–99)

## 2016-07-11 MED ORDER — AMOXICILLIN 500 MG PO CAPS: 500.0000 mg | ORAL_CAPSULE | Freq: Three times a day (TID) | ORAL | 0 refills | Status: DC

## 2016-07-11 MED ORDER — METFORMIN HCL ER 500 MG PO TB24: ORAL_TABLET | ORAL | 5 refills | Status: DC

## 2016-07-11 MED ORDER — LISINOPRIL 40 MG PO TABS: 20.0000 mg | ORAL_TABLET | Freq: Every day | ORAL | 5 refills | Status: DC

## 2016-07-11 MED ORDER — LISINOPRIL 20 MG PO TABS: 20.0000 mg | ORAL_TABLET | Freq: Two times a day (BID) | ORAL | 5 refills | Status: DC

## 2016-07-11 MED ORDER — VITAMIN D (ERGOCALCIFEROL) 1.25 MG (50000 UNIT) PO CAPS: 50000.0000 [IU] | ORAL_CAPSULE | ORAL | 3 refills | Status: DC

## 2016-07-11 MED ORDER — METHOCARBAMOL 500 MG PO TABS: 500.0000 mg | ORAL_TABLET | Freq: Two times a day (BID) | ORAL | 2 refills | Status: DC

## 2016-07-11 MED ORDER — CARVEDILOL 12.5 MG PO TABS: 12.5000 mg | ORAL_TABLET | Freq: Two times a day (BID) | ORAL | 5 refills | Status: DC

## 2016-07-11 MED ORDER — FLUTICASONE PROPIONATE 50 MCG/ACT NA SUSP
2.0000 | Freq: Every day | NASAL | 6 refills | Status: DC
Start: 2016-07-11 — End: 2017-07-20

## 2016-07-11 MED ORDER — LISINOPRIL 40 MG PO TABS: 40.0000 mg | ORAL_TABLET | Freq: Every day | ORAL | 5 refills | Status: DC

## 2016-07-11 NOTE — Assessment & Plan Note (Signed)
Chronic pain On red flags Plan: exercise Heat Muscle relaxer Weight loss

## 2016-07-11 NOTE — Patient Instructions (Addendum)
Linda Phelps was seen today for back pain.  Diagnoses and all orders for this visit:  Controlled type 2 diabetes mellitus without complication, without long-term current use of insulin (HCC) -     POCT glycosylated hemoglobin (Hb A1C) -     Glucose (CBG) -     BASIC METABOLIC PANEL WITH GFR -     metFORMIN (GLUCOPHAGE XR) 500 MG 24 hr tablet; Take 500 mg in morning and 1000 mg in evening  Essential hypertension -     BASIC METABOLIC PANEL WITH GFR -     Discontinue: lisinopril (PRINIVIL,ZESTRIL) 40 MG tablet; Take 0.5 tablets (20 mg total) by mouth daily. -     carvedilol (COREG) 12.5 MG tablet; Take 1 tablet (12.5 mg total) by mouth 2 (two) times daily with a meal. -     Discontinue: lisinopril (PRINIVIL,ZESTRIL) 40 MG tablet; Take 1 tablet (40 mg total) by mouth daily. -     lisinopril (PRINIVIL,ZESTRIL) 20 MG tablet; Take 1 tablet (20 mg total) by mouth 2 (two) times daily.  Vitamin D insufficiency -     Vitamin D, Ergocalciferol, (DRISDOL) 50000 units CAPS capsule; Take 1 capsule (50,000 Units total) by mouth every 30 (thirty) days.  Chronic right-sided low back pain without sciatica -     methocarbamol (ROBAXIN) 500 MG tablet; Take 1 tablet (500 mg total) by mouth 2 (two) times daily.  Obesity (BMI 30.0-34.9) -     metFORMIN (GLUCOPHAGE XR) 500 MG 24 hr tablet; Take 500 mg in morning and 1000 mg in evening  Acute non-recurrent frontal sinusitis -     amoxicillin (AMOXIL) 500 MG capsule; Take 1 capsule (500 mg total) by mouth 3 (three) times daily.   F/u in 4 weeks for pap smear  Dr. Adrian Blackwater

## 2016-07-11 NOTE — Assessment & Plan Note (Signed)
Well controlled Increase metformin to 500 mg in AM and 1000 mg in PM to help with weight management

## 2016-07-11 NOTE — Progress Notes (Signed)
Subjective:  Patient ID: Linda Phelps, female    DOB: 1970/09/25  Age: 46 y.o. MRN: [id number]  CC: Back Pain   HPI Gennett Garcia  Has HTN, obesity diabetes she presents for   1. Back pain: she was seen in ED on 05/23/2016 for acute low back pain x 1 week. Pain was exacerbated by prolonged standing. Back pain was associated with lower abdominal pain. She also had an episode of lightheadedness/dizzines on the day prior to her ED visit that resolved with taking her BP medication. She had elevated BP of 154/81 in ED. HR 66. UA was obtained and negative except for 30 protein.   2. F/u abnormal chest x-ray: she was seen in ED on 05/03/16 for chest pain. EKG and troponin x 2 were negative for ACS. She had a chest x-ray done that revealed a density in the right suprahilar region that my reflect atelectasis or early pneumonia. She was treated with a 5 day course of azithromycin. F/u chest x-ray has been ordered but not yet completed by patient. She feels congested. Her daughter had the flu. Her temp was 98.9 a few days ago. She feels a little short of breath. She has cough that is productive of white sputum.   3. HTN: complaint with regimen. Has decreased coreg to 12.5 mg 4 times a day due to dizziness with 25 mg dose. Not taking NSAID due to renal insufficiency.   4. DM2: compliant with metformin 500 mg BID. Upset with weight gain. No exercising. Feels stressed.   Social History  Substance Use Topics  . Smoking status: Never Smoker  . Smokeless tobacco: Never Used  . Alcohol use 0.0 oz/week     Comment: occ     Outpatient Medications Prior to Visit  Medication Sig Dispense Refill  . amLODipine (NORVASC) 10 MG tablet TAKE 1 TABLET BY MOUTH DAILY. 30 tablet 2  . amoxicillin (AMOXIL) 500 MG capsule Take 1 capsule (500 mg total) by mouth 3 (three) times daily. 30 capsule 0  . aspirin 81 MG tablet Take 81 mg by mouth daily.    . carvedilol (COREG) 25 MG tablet Take 1 tablet (25 mg total) by mouth 2  (two) times daily with a meal. 60 tablet 5  . fluticasone (FLONASE) 50 MCG/ACT nasal spray Place 2 sprays into both nostrils daily. 16 g 6  . furosemide (LASIX) 20 MG tablet TAKE 1 TABLET BY MOUTH 2 TIMES DAILY. 60 tablet 2  . ketoconazole (NIZORAL) 2 % cream APPLY 1 APPLICATION TOPICALLY DAILY. 60 g 0  . levothyroxine (SYNTHROID, LEVOTHROID) 50 MCG tablet TAKE 1 TABLET BY MOUTH DAILY BEFORE BREAKFAST 30 tablet 3  . lisinopril (PRINIVIL,ZESTRIL) 20 MG tablet TAKE 1 TABLET BY MOUTH DAILY. 30 tablet 2  . metFORMIN (GLUCOPHAGE XR) 500 MG 24 hr tablet Take 1 tablet (500 mg total) by mouth 2 (two) times daily. 60 tablet 5  . methocarbamol (ROBAXIN) 500 MG tablet Take 1 tablet (500 mg total) by mouth 2 (two) times daily. 20 tablet 0  . ranitidine (ZANTAC) 300 MG tablet Take 1 tablet (300 mg total) by mouth at bedtime. 30 tablet 5  . Vitamin D, Ergocalciferol, (DRISDOL) 50000 units CAPS capsule Take 1 capsule (50,000 Units total) by mouth every 30 (thirty) days. For 8 weeks 4 capsule 3   No facility-administered medications prior to visit.     ROS Review of Systems  Constitutional: Negative for chills and fever.  HENT: Positive for congestion.   Eyes: Negative for  visual disturbance.  Respiratory: Positive for cough. Negative for shortness of breath.   Cardiovascular: Negative for chest pain.  Gastrointestinal: Negative for abdominal pain and blood in stool.  Musculoskeletal: Positive for back pain and myalgias. Negative for arthralgias.  Skin: Negative for rash.  Allergic/Immunologic: Negative for immunocompromised state.  Hematological: Negative for adenopathy. Does not bruise/bleed easily.  Psychiatric/Behavioral: Negative for dysphoric mood and suicidal ideas.    Objective:  BP (!) 141/78 (BP Location: Left Arm, Patient Position: Sitting, Cuff Size: Large)   Pulse 78   Temp 98.1 F (36.7 C) (Oral)   Ht 5\' 1"  (1.549 m)   Wt 195 lb 12.8 oz (88.8 kg)   LMP 10/08/2013   SpO2 99%   BMI  37.00 kg/m   BP/Weight 07/11/2016 1/0/1751 0/06/5850  Systolic BP 778 242 -  Diastolic BP 78 73 -  Wt. (Lbs) 195.8 - 191  BMI 37 - 36.09    Physical Exam  Constitutional: She is oriented to person, place, and time. She appears well-developed and well-nourished. No distress.  Obese   HENT:  Head: Normocephalic and atraumatic.  Nose: Left sinus exhibits frontal sinus tenderness.  Cardiovascular: Normal rate, regular rhythm, normal heart sounds and intact distal pulses.   Pulmonary/Chest: Effort normal and breath sounds normal.  Musculoskeletal: She exhibits no edema.  Neurological: She is alert and oriented to person, place, and time.  Skin: Skin is warm and dry. No rash noted.  Psychiatric: She has a normal mood and affect.    Lab Results  Component Value Date   HGBA1C 6.5 01/11/2016   Lab Results  Component Value Date   HGBA1C 6.2 07/11/2016    CBG 193  Assessment & Plan:  Kaizley was seen today for back pain.  Diagnoses and all orders for this visit:  Controlled type 2 diabetes mellitus without complication, without long-term current use of insulin (HCC) -     POCT glycosylated hemoglobin (Hb A1C) -     Glucose (CBG) -     BASIC METABOLIC PANEL WITH GFR -     metFORMIN (GLUCOPHAGE XR) 500 MG 24 hr tablet; Take 500 mg in morning and 1000 mg in evening  Essential hypertension -     BASIC METABOLIC PANEL WITH GFR -     Discontinue: lisinopril (PRINIVIL,ZESTRIL) 40 MG tablet; Take 0.5 tablets (20 mg total) by mouth daily. -     carvedilol (COREG) 12.5 MG tablet; Take 1 tablet (12.5 mg total) by mouth 2 (two) times daily with a meal. -     Discontinue: lisinopril (PRINIVIL,ZESTRIL) 40 MG tablet; Take 1 tablet (40 mg total) by mouth daily. -     lisinopril (PRINIVIL,ZESTRIL) 20 MG tablet; Take 1 tablet (20 mg total) by mouth 2 (two) times daily.  Vitamin D insufficiency -     Vitamin D, Ergocalciferol, (DRISDOL) 50000 units CAPS capsule; Take 1 capsule (50,000 Units total)  by mouth every 30 (thirty) days.  Chronic right-sided low back pain without sciatica -     methocarbamol (ROBAXIN) 500 MG tablet; Take 1 tablet (500 mg total) by mouth 2 (two) times daily.  Obesity (BMI 30.0-34.9) -     metFORMIN (GLUCOPHAGE XR) 500 MG 24 hr tablet; Take 500 mg in morning and 1000 mg in evening  Acute non-recurrent frontal sinusitis -     amoxicillin (AMOXIL) 500 MG capsule; Take 1 capsule (500 mg total) by mouth 3 (three) times daily.  Nasal congestion -     fluticasone (FLONASE) 50 MCG/ACT  nasal spray; Place 2 sprays into both nostrils daily.   There are no diagnoses linked to this encounter.  No orders of the defined types were placed in this encounter.   Follow-up: Return in about 4 weeks (around 08/08/2016) for pap smear .   Boykin Nearing MD

## 2016-07-11 NOTE — Assessment & Plan Note (Signed)
Slightly elevated Plan: Coreg 12.5 mg BID Increase lisinopril to 20 mg BID Continue Norvasc 10 mg daily Advised exercise and weight loss

## 2016-07-12 ENCOUNTER — Telehealth: Payer: Self-pay

## 2016-07-12 NOTE — Telephone Encounter (Signed)
Pt was called and the number was not in service.

## 2016-07-19 ENCOUNTER — Telehealth: Payer: Self-pay

## 2016-07-19 NOTE — Telephone Encounter (Signed)
Pt was called and there is no VM set up to leave a message. This is the second attempt to contact patient.

## 2016-07-25 MED FILL — CARVEDILOL 12.5 MG TABLET: 12.5 | 30 days supply | Qty: 60 | Fill #0

## 2016-07-25 MED FILL — AMOXICILLIN 500 MG CAPSULE: 500 | 10 days supply | Qty: 30 | Fill #0

## 2016-07-25 MED FILL — ?LISINOPRIL 40 MG TABLET: 40 MG | 30 days supply | Qty: 30 | Fill #0

## 2016-07-25 MED FILL — AMLODIPINE BESYLATE 10 MG T: 10 | 30 days supply | Qty: 30 | Fill #1

## 2016-07-25 MED FILL — METFORMIN HCL ER 500 MG TAB: 500 | 30 days supply | Qty: 60 | Fill #1

## 2016-07-25 MED FILL — ?FUROSEMIDE 20 MG TABLET: 20 | 30 days supply | Qty: 60 | Fill #1

## 2016-07-25 MED FILL — METHOCARBAMOL 500 MG TABLET: 500 | 15 days supply | Qty: 30 | Fill #0

## 2016-08-04 MED FILL — LEVOTHYROXINE 50 MCG TABLET: 50 | 30 days supply | Qty: 30 | Fill #2

## 2016-08-08 ENCOUNTER — Other Ambulatory Visit: Payer: Self-pay | Admitting: Family Medicine

## 2016-08-21 MED FILL — AMLODIPINE BESYLATE 10 MG T: 10 | 30 days supply | Qty: 30 | Fill #2

## 2016-08-22 MED FILL — raNITIdine HCL 300 MG TABS: 300 | 30 days supply | Qty: 30 | Fill #1

## 2016-08-22 MED FILL — METFORMIN HCL ER 500 MG TAB: 500 | 30 days supply | Qty: 60 | Fill #2

## 2016-08-22 MED FILL — METHOCARBAMOL 500 MG TABLET: 500 | 15 days supply | Qty: 30 | Fill #1

## 2016-08-22 MED FILL — CARVEDILOL 12.5 MG TABLET: 12.5 | 30 days supply | Qty: 60 | Fill #1

## 2016-08-22 MED FILL — LISINOPRIL 40 MG TABLET: 40 | 30 days supply | Qty: 30 | Fill #1

## 2016-08-30 ENCOUNTER — Emergency Department (HOSPITAL_BASED_OUTPATIENT_CLINIC_OR_DEPARTMENT_OTHER)
Admission: EM | Admit: 2016-08-30 | Discharge: 2016-08-30 | Disposition: A | Discharge status: Home / Self Care | Payer: Self-pay | Attending: Emergency Medicine | Admitting: Emergency Medicine

## 2016-08-30 ENCOUNTER — Encounter (HOSPITAL_BASED_OUTPATIENT_CLINIC_OR_DEPARTMENT_OTHER): Payer: Self-pay

## 2016-08-30 DIAGNOSIS — Z7982 Long term (current) use of aspirin: Secondary | ICD-10-CM | POA: Insufficient documentation

## 2016-08-30 DIAGNOSIS — E119 Type 2 diabetes mellitus without complications: Secondary | ICD-10-CM | POA: Insufficient documentation

## 2016-08-30 DIAGNOSIS — I509 Heart failure, unspecified: Secondary | ICD-10-CM | POA: Insufficient documentation

## 2016-08-30 DIAGNOSIS — M542 Cervicalgia: Secondary | ICD-10-CM

## 2016-08-30 DIAGNOSIS — Z79899 Other long term (current) drug therapy: Secondary | ICD-10-CM | POA: Insufficient documentation

## 2016-08-30 DIAGNOSIS — Z7984 Long term (current) use of oral hypoglycemic drugs: Secondary | ICD-10-CM | POA: Insufficient documentation

## 2016-08-30 DIAGNOSIS — I110 Hypertensive heart disease with heart failure: Secondary | ICD-10-CM | POA: Insufficient documentation

## 2016-08-30 DIAGNOSIS — Z5321 Procedure and treatment not carried out due to patient leaving prior to being seen by health care provider: Secondary | ICD-10-CM | POA: Insufficient documentation

## 2016-08-30 NOTE — ED Triage Notes (Signed)
Pt later added she feels she has been wheezing and nasal congestion

## 2016-08-30 NOTE — ED Notes (Signed)
MD in room to assess pt.  Patient gown on bed and patient no longer in the department.  Patient eloped.

## 2016-08-30 NOTE — ED Triage Notes (Addendum)
c/o pain to right side of neck, back of head and chest x 2 days-denies injury-NAD-steady gait

## 2016-09-05 MED FILL — LEVOTHYROXINE 50 MCG TABLET: 50 | 30 days supply | Qty: 30 | Fill #3

## 2016-09-05 MED FILL — FUROSEMIDE 20 MG TABLET: 20 | 30 days supply | Qty: 60 | Fill #2

## 2016-09-08 ENCOUNTER — Telehealth: Payer: Self-pay | Admitting: Family Medicine

## 2016-09-08 NOTE — Telephone Encounter (Signed)
Returned pt call to clarify who called Linda Phelps I looked in the chart no notes look for upcoming appointment and seen one for April 23 for med management. Remind pt of appointment

## 2016-09-08 NOTE — Telephone Encounter (Signed)
Pt. Called stating she received a call from her nurse. Please f/u with pt.

## 2016-09-18 ENCOUNTER — Other Ambulatory Visit: Payer: Self-pay | Admitting: Family Medicine

## 2016-09-20 MED FILL — ?CARVEDILOL 12.5 MG TABLET: 12.5 | 30 days supply | Qty: 60 | Fill #2

## 2016-09-20 MED FILL — METHOCARBAMOL 500 MG TABLET: 500 | 15 days supply | Qty: 30 | Fill #2

## 2016-09-20 MED FILL — METFORMIN HCL ER 500 MG TAB: 500 | 30 days supply | Qty: 60 | Fill #3

## 2016-09-20 MED FILL — AMLODIPINE BESYLATE 10 MG T: 10 | 30 days supply | Qty: 30 | Fill #0

## 2016-09-20 MED FILL — raNITIdine HCL 300 MG TABS: 300 | 30 days supply | Qty: 30 | Fill #2

## 2016-09-20 MED FILL — LISINOPRIL 40 MG TABLET: 40 | 30 days supply | Qty: 30 | Fill #2

## 2016-10-04 ENCOUNTER — Encounter: Payer: Self-pay | Admitting: Family Medicine

## 2016-10-05 ENCOUNTER — Other Ambulatory Visit: Payer: Self-pay | Admitting: Family Medicine

## 2016-10-05 DIAGNOSIS — I10 Essential (primary) hypertension: Secondary | ICD-10-CM

## 2016-10-05 MED FILL — FUROSEMIDE 20 MG TABLET: 20 | 30 days supply | Qty: 60 | Fill #0

## 2016-10-05 MED FILL — LEVOTHYROXINE 50 MCG TABLET: 50 | 30 days supply | Qty: 30 | Fill #0

## 2016-10-18 MED FILL — ?AMLODIPINE BESYLATE 10 MG: 10 | 30 days supply | Qty: 30 | Fill #1

## 2016-10-18 MED FILL — LISINOPRIL 40 MG TABLET: 40 | 30 days supply | Qty: 30 | Fill #3

## 2016-10-18 MED FILL — CARVEDILOL 12.5 MG TABLET: 12.5 | 30 days supply | Qty: 60 | Fill #3

## 2016-10-18 MED FILL — METFORMIN HCL ER 500 MG TAB: 500 | 30 days supply | Qty: 60 | Fill #4

## 2016-10-18 MED FILL — raNITIdine HCL 300 MG TABS: 300 | 30 days supply | Qty: 30 | Fill #3

## 2016-11-01 ENCOUNTER — Other Ambulatory Visit: Payer: Self-pay | Admitting: Family Medicine

## 2016-11-01 DIAGNOSIS — I10 Essential (primary) hypertension: Secondary | ICD-10-CM

## 2016-11-01 MED FILL — LEVOTHYROXINE 50 MCG TABLET: 50 | 30 days supply | Qty: 30 | Fill #1

## 2016-11-02 MED FILL — FUROSEMIDE 20 MG TABLET: 20 | 30 days supply | Qty: 60 | Fill #0

## 2016-11-03 ENCOUNTER — Ambulatory Visit: Payer: Self-pay | Admitting: Family Medicine

## 2016-11-17 ENCOUNTER — Other Ambulatory Visit: Payer: Self-pay | Admitting: Family Medicine

## 2016-11-17 DIAGNOSIS — E669 Obesity, unspecified: Secondary | ICD-10-CM

## 2016-11-17 DIAGNOSIS — E119 Type 2 diabetes mellitus without complications: Secondary | ICD-10-CM

## 2016-11-29 ENCOUNTER — Other Ambulatory Visit: Payer: Self-pay | Admitting: Family Medicine

## 2016-11-29 DIAGNOSIS — I10 Essential (primary) hypertension: Secondary | ICD-10-CM

## 2016-11-29 MED FILL — LEVOTHYROXINE 50 MCG TABLET: 50 | 30 days supply | Qty: 30 | Fill #2

## 2016-11-30 MED FILL — ?FUROSEMIDE 20 MG TABLET: 20 | 30 days supply | Qty: 60 | Fill #0

## 2016-12-01 ENCOUNTER — Other Ambulatory Visit: Payer: Self-pay | Admitting: Family Medicine

## 2016-12-01 DIAGNOSIS — E119 Type 2 diabetes mellitus without complications: Secondary | ICD-10-CM

## 2016-12-01 DIAGNOSIS — E669 Obesity, unspecified: Secondary | ICD-10-CM

## 2016-12-01 MED ORDER — METFORMIN HCL ER 500 MG PO TB24: ORAL_TABLET | ORAL | 0 refills | Status: DC

## 2016-12-01 MED FILL — LISINOPRIL 40 MG TABLET: 40 | 30 days supply | Qty: 30 | Fill #4

## 2016-12-01 MED FILL — ?CARVEDILOL 12.5 MG TABLET: 12.5 | 30 days supply | Qty: 60 | Fill #4

## 2016-12-01 MED FILL — raNITIdine HCL 300 MG TABS: 300 | 30 days supply | Qty: 30 | Fill #4

## 2016-12-01 MED FILL — METFORMIN HCL ER 500 MG TAB: 500 | 30 days supply | Qty: 60 | Fill #5

## 2016-12-01 MED FILL — ?AMLODIPINE BESYLATE 10 MG: 10 | 30 days supply | Qty: 30 | Fill #2

## 2016-12-04 ENCOUNTER — Ambulatory Visit: Payer: Self-pay | Admitting: Family Medicine

## 2016-12-12 ENCOUNTER — Ambulatory Visit: Payer: Self-pay | Admitting: Family Medicine

## 2016-12-12 NOTE — Progress Notes (Deleted)
Subjective:  Patient ID: Linda Phelps, female    DOB: [birth year]  Age: 46 y.o. MRN: [id number]  CC: No chief complaint on file.   HPI [name]  Has HTN, obesity diabetes she presents for   1. Back pain: she was seen in ED on 05/23/2016 for acute low back pain x 1 week. Pain was exacerbated by prolonged standing. Back pain was associated with lower abdominal pain. She also had an episode of lightheadedness/dizzines on the day prior to her ED visit that resolved with taking her BP medication. She had elevated BP of 154/81 in ED. HR 66. UA was obtained and negative except for 30 protein.   2. F/u abnormal chest x-ray: she was seen in ED on 05/03/16 for chest pain. EKG and troponin x 2 were negative for ACS. She had a chest x-ray done that revealed a density in the right suprahilar region that my reflect atelectasis or early pneumonia. She was treated with a 5 day course of azithromycin. F/u chest x-ray has been ordered but not yet completed by patient. She feels congested. Her daughter had the flu. Her temp was 98.9 a few days ago. She feels a little short of breath. She has cough that is productive of white sputum.   3. HTN: complaint with regimen. Has decreased coreg to 12.5 mg 4 times a day due to dizziness with 25 mg dose. Not taking NSAID due to renal insufficiency.   4. DM2: compliant with metformin 500 mg BID. Upset with weight gain. No exercising. Feels stressed.   Social History  Substance Use Topics  . Smoking status: Never Smoker  . Smokeless tobacco: Never Used  . Alcohol use 0.0 oz/week     Comment: occ     Outpatient Medications Prior to Visit  Medication Sig Dispense Refill  . amLODipine (NORVASC) 10 MG tablet TAKE 1 TABLET BY MOUTH DAILY. 30 tablet 0  . amoxicillin (AMOXIL) 500 MG capsule Take 1 capsule (500 mg total) by mouth 3 (three) times daily. 30 capsule 0  . aspirin 81 MG tablet Take 81 mg by mouth daily.    . carvedilol (COREG) 12.5 MG tablet Take 1 tablet (12.5  mg total) by mouth 2 (two) times daily with a meal. 60 tablet 5  . fluticasone (FLONASE) 50 MCG/ACT nasal spray Place 2 sprays into both nostrils daily. 16 g 6  . furosemide (LASIX) 20 MG tablet TAKE 1 TABLET BY MOUTH 2 TIMES DAILY. 60 tablet 0  . levothyroxine (SYNTHROID, LEVOTHROID) 50 MCG tablet TAKE 1 TABLET BY MOUTH ONCE DAILY 30 tablet 3  . lisinopril (PRINIVIL,ZESTRIL) 20 MG tablet Take 1 tablet (20 mg total) by mouth 2 (two) times daily. 60 tablet 5  . metFORMIN (GLUCOPHAGE XR) 500 MG 24 hr tablet Take 500 mg in morning and 1000 mg in evening 90 tablet 0  . methocarbamol (ROBAXIN) 500 MG tablet Take 1 tablet (500 mg total) by mouth 2 (two) times daily. 30 tablet 2  . ranitidine (ZANTAC) 300 MG tablet Take 1 tablet (300 mg total) by mouth at bedtime. (Patient not taking: Reported on 07/11/2016) 30 tablet 5  . Vitamin D, Ergocalciferol, (DRISDOL) 50000 units CAPS capsule Take 1 capsule (50,000 Units total) by mouth every 30 (thirty) days. 4 capsule 3   No facility-administered medications prior to visit.     ROS Review of Systems  Constitutional: Negative for chills and fever.  HENT: Positive for congestion.   Eyes: Negative for visual disturbance.  Respiratory: Positive for  cough. Negative for shortness of breath.   Cardiovascular: Negative for chest pain.  Gastrointestinal: Negative for abdominal pain and blood in stool.  Musculoskeletal: Positive for back pain and myalgias. Negative for arthralgias.  Skin: Negative for rash.  Allergic/Immunologic: Negative for immunocompromised state.  Hematological: Negative for adenopathy. Does not bruise/bleed easily.  Psychiatric/Behavioral: Negative for dysphoric mood and suicidal ideas.    Objective:  LMP 10/08/2013   BP/Weight 08/30/2016 05/24/7251 10/25/4401  Systolic BP 474 259 563  Diastolic BP 89 78 73  Wt. (Lbs) 189 195.8 -  BMI 35.71 37 -    Physical Exam  Constitutional: She is oriented to person, place, and time. She appears  well-developed and well-nourished. No distress.  Obese   HENT:  Head: Normocephalic and atraumatic.  Nose: Left sinus exhibits frontal sinus tenderness.  Cardiovascular: Normal rate, regular rhythm, normal heart sounds and intact distal pulses.   Pulmonary/Chest: Effort normal and breath sounds normal.  Musculoskeletal: She exhibits no edema.  Neurological: She is alert and oriented to person, place, and time.  Skin: Skin is warm and dry. No rash noted.  Psychiatric: She has a normal mood and affect.    Lab Results  Component Value Date   HGBA1C 6.2 07/11/2016   Lab Results  Component Value Date   HGBA1C 6.2 07/11/2016    CBG 193  Assessment & Plan:  There are no diagnoses linked to this encounter. There are no diagnoses linked to this encounter.  No orders of the defined types were placed in this encounter.   Follow-up: No Follow-up on file.   Boykin Nearing MD

## 2016-12-13 ENCOUNTER — Encounter (HOSPITAL_BASED_OUTPATIENT_CLINIC_OR_DEPARTMENT_OTHER): Payer: Self-pay

## 2016-12-13 ENCOUNTER — Emergency Department (HOSPITAL_BASED_OUTPATIENT_CLINIC_OR_DEPARTMENT_OTHER): Payer: Self-pay

## 2016-12-13 ENCOUNTER — Emergency Department (HOSPITAL_BASED_OUTPATIENT_CLINIC_OR_DEPARTMENT_OTHER)
Admission: EM | Admit: 2016-12-13 | Discharge: 2016-12-14 | Disposition: A | Discharge status: Home / Self Care | Payer: Self-pay | Attending: Emergency Medicine | Admitting: Emergency Medicine

## 2016-12-13 DIAGNOSIS — E119 Type 2 diabetes mellitus without complications: Secondary | ICD-10-CM | POA: Insufficient documentation

## 2016-12-13 DIAGNOSIS — I509 Heart failure, unspecified: Secondary | ICD-10-CM | POA: Insufficient documentation

## 2016-12-13 DIAGNOSIS — K219 Gastro-esophageal reflux disease without esophagitis: Secondary | ICD-10-CM | POA: Insufficient documentation

## 2016-12-13 DIAGNOSIS — E039 Hypothyroidism, unspecified: Secondary | ICD-10-CM | POA: Insufficient documentation

## 2016-12-13 DIAGNOSIS — Z7984 Long term (current) use of oral hypoglycemic drugs: Secondary | ICD-10-CM | POA: Insufficient documentation

## 2016-12-13 DIAGNOSIS — R0789 Other chest pain: Secondary | ICD-10-CM | POA: Insufficient documentation

## 2016-12-13 DIAGNOSIS — I110 Hypertensive heart disease with heart failure: Secondary | ICD-10-CM | POA: Insufficient documentation

## 2016-12-13 DIAGNOSIS — Z79899 Other long term (current) drug therapy: Secondary | ICD-10-CM | POA: Insufficient documentation

## 2016-12-13 LAB — CBC
HEMATOCRIT: 37.1 % (ref 36.0–46.0)
Hemoglobin: 12.4 g/dL (ref 12.0–15.0)
MCH: 29.8 pg (ref 26.0–34.0)
MCHC: 33.4 g/dL (ref 30.0–36.0)
MCV: 89.2 fL (ref 78.0–100.0)
Platelets: 297 10*3/uL (ref 150–400)
RBC: 4.16 MIL/uL (ref 3.87–5.11)
RDW: 13 % (ref 11.5–15.5)
WBC: 6.4 10*3/uL (ref 4.0–10.5)

## 2016-12-13 MED ORDER — GI COCKTAIL ~~LOC~~
30.0000 mL | Freq: Once | ORAL | Status: AC
  Administered 2016-12-14: 30 mL via ORAL
  Filled 2016-12-13: qty 30

## 2016-12-13 MED ORDER — PANTOPRAZOLE SODIUM 40 MG IV SOLR
40.0000 mg | Freq: Once | INTRAVENOUS | Status: AC
  Administered 2016-12-14: 40 mg via INTRAVENOUS
  Filled 2016-12-13: qty 40

## 2016-12-13 NOTE — ED Triage Notes (Signed)
Reports chest pain that started approx 1 hr ago.  Reports it is in her throat.  Reports its possible just acid reflux.  Reports she took baby ASA x 1 this am and two tonight at bedtime.  Denies any other symptoms associated

## 2016-12-13 NOTE — ED Provider Notes (Signed)
[facility] Provider Note   CSN: [id number] Arrival date & time: 12/13/16  2304  By signing my name below, I, Dora Sims, attest that this documentation has been prepared under the direction and in the presence of Dewel Lotter, PA-C. Electronically Signed: Dora Sims, Scribe. 12/13/2016. 11:49 PM.  History   Chief Complaint Chief Complaint  Patient presents with  . Chest Pain   The history is provided by the patient. No language interpreter was used.    HPI Comments: Linda Phelps is a 46 y.o. female with PMHx including CHF, DM, HTN, and high cholesterol who presents to the Emergency Department complaining of intermittent, burning upper chest pain that began about an hour and a half ago. The pain presented while she was at rest. She rates the pain at 7/10 in severity. It is worse with activity and mildly improved by resting. Patient has been belching frequently and believes her pain may be a result of acid reflux. She last ate around 5 PM. Patient uses Zantac for indigestion on a nightly basis and took it tonight without improvement of her chest pain. Patient also tried Gas-X without relief. She denies any prior h/o the same. Patient is symptom-free upon my interview. Patient denies nausea, vomiting, diarrhea, dyspnea, a foul taste in her mouth, or any other associated symptoms.  Of note, she was diagnosed with CHF in 2005 when she was pregnant with her daughter. However, she had a normal echocardiogram in 2017. She takes Lasix x2 daily. Patient denies any acute leg swelling or any other acute complaints at this time.   Past Medical History:  Diagnosis Date  . CHF (congestive heart failure) ([place])   . Diabetes mellitus without complication ([place])   . Ectopic pregnancy   . High cholesterol   . Hypertension   . Obesity   . Thyroid disease     Patient Active Problem List   Diagnosis Date Noted  . Chronic right-sided low back pain without sciatica 07/11/2016  . Acute non  intractable tension-type headache 05/18/2016  . Abnormal chest x-ray 05/18/2016  . Vitamin D insufficiency 01/13/2016  . Menstrual cycle problem 01/11/2016  . Hordeolum externum (stye) 01/11/2016  . Obesity (BMI 30.0-34.9) 10/01/2015  . Palpitations 07/26/2015  . GERD (gastroesophageal reflux disease) 07/26/2015  . Generalized anxiety disorder 07/26/2015  . Poor dentition 04/13/2015  . Cardiomegaly 09/23/2014  . Diabetes type 2, controlled ([place]) 07/03/2014  . Essential hypertension 07/03/2014  . Hyperlipidemia 07/03/2014  . Hypothyroidism 07/03/2014    Past Surgical History:  Procedure Laterality Date  . APPENDECTOMY    . CHOLECYSTECTOMY      OB History    No data available       Home Medications    Prior to Admission medications   Medication Sig Start Date End Date Taking? Authorizing Provider  amLODipine (NORVASC) 10 MG tablet TAKE 1 TABLET BY MOUTH DAILY. 12/01/16   Funches, Adriana Mccallum, MD  amoxicillin (AMOXIL) 500 MG capsule Take 1 capsule (500 mg total) by mouth 3 (three) times daily. 07/11/16   Boykin Nearing, MD  aspirin 81 MG tablet Take 81 mg by mouth daily.    [provider]  carvedilol (COREG) 12.5 MG tablet Take 1 tablet (12.5 mg total) by mouth 2 (two) times daily with a meal. 07/11/16   Funches, Josalyn, MD  fluticasone (FLONASE) 50 MCG/ACT nasal spray Place 2 sprays into both nostrils daily. 07/11/16   Funches, Adriana Mccallum, MD  furosemide (LASIX) 20 MG tablet TAKE 1 TABLET BY MOUTH  2 TIMES DAILY. 11/29/16   Funches, Adriana Mccallum, MD  levothyroxine (SYNTHROID, LEVOTHROID) 50 MCG tablet TAKE 1 TABLET BY MOUTH ONCE DAILY 10/05/16   Funches, Adriana Mccallum, MD  lisinopril (PRINIVIL,ZESTRIL) 20 MG tablet Take 1 tablet (20 mg total) by mouth 2 (two) times daily. 07/11/16   Boykin Nearing, MD  metFORMIN (GLUCOPHAGE XR) 500 MG 24 hr tablet Take 500 mg in morning and 1000 mg in evening 12/01/16   Funches, Josalyn, MD  methocarbamol (ROBAXIN) 500 MG tablet Take 1 tablet (500 mg  total) by mouth 2 (two) times daily. 07/11/16   Funches, Adriana Mccallum, MD  pantoprazole (PROTONIX) 20 MG tablet Take 1 tablet (20 mg total) by mouth daily. 12/14/16   Meleah Demeyer C, PA-C  ranitidine (ZANTAC) 300 MG tablet Take 1 tablet (300 mg total) by mouth at bedtime. Patient not taking: Reported on 07/11/2016 05/18/16   Boykin Nearing, MD  Vitamin D, Ergocalciferol, (DRISDOL) 50000 units CAPS capsule Take 1 capsule (50,000 Units total) by mouth every 30 (thirty) days. 07/11/16   Boykin Nearing, MD    Family History Family History  Problem Relation Age of Onset  . Diabetes Mother   . Diabetes Maternal Grandmother   . Colon cancer Maternal Uncle 43    Social History Social History  Substance Use Topics  . Smoking status: Never Smoker  . Smokeless tobacco: Never Used  . Alcohol use 0.0 oz/week     Comment: occ     Allergies   Patient has no known allergies.   Review of Systems Review of Systems  Constitutional: Negative for chills and fever.  Respiratory: Negative for shortness of breath.   Cardiovascular: Positive for chest pain. Negative for leg swelling.  Gastrointestinal: Negative for abdominal pain, diarrhea, nausea and vomiting.  Neurological: Negative for dizziness, light-headedness and headaches.  All other systems reviewed and are negative.  Physical Exam Updated Vital Signs BP (!) 183/87 (BP Location: Left Arm)   Pulse 74   Temp 98.2 F (36.8 C) (Oral)   Resp 18   Ht 5\' 1"  (1.549 m)   Wt 180 lb (81.6 kg)   LMP 10/08/2013   SpO2 100%   BMI 34.01 kg/m   Physical Exam  Constitutional: She appears well-developed and well-nourished. No distress.  HENT:  Head: Normocephalic and atraumatic.  Eyes: Conjunctivae are normal.  Neck: Neck supple.  Cardiovascular: Normal rate, regular rhythm, normal heart sounds and intact distal pulses.   Pulmonary/Chest: Effort normal and breath sounds normal. No respiratory distress. She exhibits no tenderness.  Abdominal: Soft.  There is no tenderness. There is no guarding.  Musculoskeletal: She exhibits no edema.  Lymphadenopathy:    She has no cervical adenopathy.  Neurological: She is alert.  Skin: Skin is warm and dry. She is not diaphoretic.  Psychiatric: She has a normal mood and affect. Her behavior is normal.  Nursing note and vitals reviewed.  ED Treatments / Results  Labs (all labs ordered are listed, but only abnormal results are displayed) Labs Reviewed  COMPREHENSIVE METABOLIC PANEL - Abnormal; Notable for the following:       Result Value   Glucose, Bld 108 (*)    All other components within normal limits  CBC  TROPONIN I    EKG  EKG Interpretation  Date/Time:  Wednesday December 13 2016 23:09:02 EDT Ventricular Rate:  73 PR Interval:  178 QRS Duration: 102 QT Interval:  394 QTC Calculation: 434 R Axis:   -7 Text Interpretation:  Normal sinus rhythm Possible Left atrial  enlargement Left ventricular hypertrophy Abnormal ECG No significant change was found Confirmed by Shanon Rosser [phone]) on 12/13/2016 11:12:02 PM       Radiology Dg Chest 2 View  Result Date: 12/14/2016 CLINICAL DATA:  Left-sided chest pain, onset tonight. EXAM: CHEST  2 VIEW COMPARISON:  05/03/2016 FINDINGS: The lungs are clear. The pulmonary vasculature is normal. Heart size is normal. Hilar and mediastinal contours are unremarkable. There is no pleural effusion. IMPRESSION: No active cardiopulmonary disease. Electronically Signed   By: Andreas Newport M.D.   On: 12/14/2016 00:05    Procedures Procedures (including critical care time)  DIAGNOSTIC STUDIES: Oxygen Saturation is 100% on RA, normal by my interpretation.    COORDINATION OF CARE: 11:35 PM Discussed treatment plan with pt at bedside and pt agreed to plan.  Medications Ordered in ED Medications  gi cocktail (Maalox,Lidocaine,Donnatal) (30 mLs Oral Given 12/14/16 0006)  pantoprazole (PROTONIX) injection 40 mg (40 mg Intravenous Given 12/14/16 0007)      Initial Impression / Assessment and Plan / ED Course  I have reviewed the triage vital signs and the nursing notes.  Pertinent labs & imaging results that were available during my care of the patient were reviewed by me and considered in my medical decision making (see chart for details).  Clinical Course as of Dec 15 31  Thu Dec 14, 2016  0018 Discussed imaging and lab results with patient. Patient voices improvement with GI cocktail and protonix.   [SJ]    Clinical Course User Index [SJ] Avaline Stillson C, PA-C    Patient presents with atypical chest pain. Low suspicion for ACS. HEART score is 3, indicating low risk for a cardiac event. Wells criteria score is 0, indicating low risk for PE. PERC negative. Symptoms are more consistent with GERD. Labs results encouraging. Relief in symptoms with GI cocktail and Protonix. GI and PCP follow-up. The patient was given instructions for home care as well as return precautions. Patient voices understanding of these instructions, accepts the plan, and is comfortable with discharge.  Findings and plan of care discussed with Dr. Florina Ou, who personally evaluated and examined this patient.  Vitals:   12/13/16 2309 12/13/16 2310 12/13/16 2345  BP:  (!) 183/87 127/63  Pulse:  74 68  Resp:  18 15  Temp:  98.2 F (36.8 C)   TempSrc:  Oral   SpO2:  100% 96%  Weight: 81.6 kg (180 lb)    Height: 5\' 1"  (1.549 m)       Final Clinical Impressions(s) / ED Diagnoses   Final diagnoses:  Atypical chest pain    New Prescriptions New Prescriptions   PANTOPRAZOLE (PROTONIX) 20 MG TABLET    Take 1 tablet (20 mg total) by mouth daily.   I personally performed the services described in this documentation, which was scribed in my presence. The recorded information has been reviewed and is accurate.   Lorayne Bender, PA-C 12/14/16 0042    Shanon Rosser, MD 12/14/16 [phone]

## 2016-12-14 LAB — COMPREHENSIVE METABOLIC PANEL
ALBUMIN: 4 g/dL (ref 3.5–5.0)
ALT: 18 U/L (ref 14–54)
ANION GAP: 8 (ref 5–15)
AST: 22 U/L (ref 15–41)
Alkaline Phosphatase: 59 U/L (ref 38–126)
BUN: 19 mg/dL (ref 6–20)
CO2: 27 mmol/L (ref 22–32)
Calcium: 8.9 mg/dL (ref 8.9–10.3)
Chloride: 103 mmol/L (ref 101–111)
Creatinine, Ser: 0.84 mg/dL (ref 0.44–1.00)
GFR calc Af Amer: >60 mL/min (ref >60)
GFR calc non Af Amer: >60 mL/min (ref >60)
GLUCOSE: 108 mg/dL — AB (ref 65–99)
POTASSIUM: 3.9 mmol/L (ref 3.5–5.1)
SODIUM: 138 mmol/L (ref 135–145)
Total Bilirubin: 0.7 mg/dL (ref 0.3–1.2)
Total Protein: 7.7 g/dL (ref 6.5–8.1)

## 2016-12-14 LAB — TROPONIN I: Troponin I: <0.03 ng/mL (ref <0.03)

## 2016-12-14 MED ORDER — PANTOPRAZOLE SODIUM 20 MG PO TBEC: 20.0000 mg | DELAYED_RELEASE_TABLET | Freq: Every day | ORAL | 0 refills | Status: DC

## 2016-12-14 NOTE — Discharge Instructions (Signed)
Your lab and imaging results were encouraging today. Begin taking the Protonix daily, 20-30 minutes prior to your first meal the day until symptoms resolve. After this, you may alternate this type of medication with the zantac, as needed for onset of symptoms.  Follow-up with the gastroenterologist for further management of this issue. Call the number provided and set up an appointment.

## 2016-12-18 ENCOUNTER — Ambulatory Visit: Payer: Self-pay | Admitting: Family Medicine

## 2016-12-25 MED FILL — METFORMIN HCL ER 500 MG TAB: 500 | 30 days supply | Qty: 90 | Fill #0

## 2016-12-25 MED FILL — CARVEDILOL 12.5 MG TABLET: 12.5 | 30 days supply | Qty: 60 | Fill #5

## 2016-12-25 MED FILL — AMLODIPINE BESYLATE 10 MG T: 10 | 30 days supply | Qty: 30 | Fill #0

## 2016-12-25 MED FILL — PANTOPRAZOLE SOD DR 20 MG T: 20 | 14 days supply | Qty: 14 | Fill #0

## 2016-12-25 MED FILL — raNITIdine HCL 300 MG TABS: 300 | 30 days supply | Qty: 30 | Fill #5

## 2016-12-25 MED FILL — LISINOPRIL 40 MG TABLET: 40 | 30 days supply | Qty: 30 | Fill #5

## 2016-12-29 ENCOUNTER — Other Ambulatory Visit: Payer: Self-pay | Admitting: Family Medicine

## 2016-12-29 DIAGNOSIS — I10 Essential (primary) hypertension: Secondary | ICD-10-CM

## 2017-01-08 ENCOUNTER — Ambulatory Visit: Payer: Self-pay | Admitting: Licensed Clinical Social Worker

## 2017-01-08 ENCOUNTER — Encounter: Payer: Self-pay | Admitting: Family Medicine

## 2017-01-08 ENCOUNTER — Ambulatory Visit: Payer: Self-pay | Attending: Family Medicine | Admitting: Family Medicine

## 2017-01-08 VITALS — BP 120/81 | HR 60 | Temp 98.1°F | Resp 18 | Ht 61.0 in | Wt 190.6 lb

## 2017-01-08 DIAGNOSIS — I110 Hypertensive heart disease with heart failure: Secondary | ICD-10-CM | POA: Insufficient documentation

## 2017-01-08 DIAGNOSIS — I10 Essential (primary) hypertension: Secondary | ICD-10-CM

## 2017-01-08 DIAGNOSIS — F411 Generalized anxiety disorder: Secondary | ICD-10-CM

## 2017-01-08 DIAGNOSIS — M545 Low back pain: Secondary | ICD-10-CM

## 2017-01-08 DIAGNOSIS — E119 Type 2 diabetes mellitus without complications: Secondary | ICD-10-CM | POA: Insufficient documentation

## 2017-01-08 DIAGNOSIS — I509 Heart failure, unspecified: Secondary | ICD-10-CM | POA: Insufficient documentation

## 2017-01-08 DIAGNOSIS — E039 Hypothyroidism, unspecified: Secondary | ICD-10-CM

## 2017-01-08 DIAGNOSIS — G8929 Other chronic pain: Secondary | ICD-10-CM

## 2017-01-08 DIAGNOSIS — S161XXS Strain of muscle, fascia and tendon at neck level, sequela: Secondary | ICD-10-CM

## 2017-01-08 DIAGNOSIS — E1121 Type 2 diabetes mellitus with diabetic nephropathy: Secondary | ICD-10-CM

## 2017-01-08 LAB — POCT UA - MICROALBUMIN
Albumin/Creatinine Ratio, Urine, POC: >300
CREATININE, POC: 100 mg/dL
MICROALBUMIN (UR) POC: 150 mg/L

## 2017-01-08 LAB — GLUCOSE, POCT (MANUAL RESULT ENTRY): POC Glucose: 87 mg/dl (ref 70–99)

## 2017-01-08 LAB — POCT GLYCOSYLATED HEMOGLOBIN (HGB A1C): Hemoglobin A1C: 6

## 2017-01-08 MED ORDER — METHOCARBAMOL 500 MG PO TABS: 500.0000 mg | ORAL_TABLET | Freq: Three times a day (TID) | ORAL | 2 refills | Status: DC | PRN

## 2017-01-08 MED ORDER — METFORMIN HCL ER 500 MG PO TB24: ORAL_TABLET | ORAL | 3 refills | Status: DC

## 2017-01-08 MED ORDER — AMLODIPINE BESYLATE 10 MG PO TABS: 10.0000 mg | ORAL_TABLET | Freq: Every day | ORAL | 3 refills | Status: DC

## 2017-01-08 MED ORDER — CITALOPRAM HYDROBROMIDE 20 MG PO TABS: 20.0000 mg | ORAL_TABLET | Freq: Every day | ORAL | 1 refills | Status: DC

## 2017-01-08 MED ORDER — ACETAMINOPHEN 500 MG PO TABS: 1000.0000 mg | ORAL_TABLET | Freq: Four times a day (QID) | ORAL | 0 refills | Status: DC | PRN

## 2017-01-08 MED ORDER — LISINOPRIL 40 MG PO TABS
40.0000 mg | ORAL_TABLET | Freq: Every day | ORAL | 3 refills | Status: DC
Start: 2017-01-08 — End: 2017-05-09

## 2017-01-08 MED FILL — CITALOPRAM HBR 20 MG TABLET: 20 | 30 days supply | Qty: 30 | Fill #0 | Status: TO

## 2017-01-08 MED FILL — METHOCARBAMOL 500 MG TABS: 500 | 10 days supply | Qty: 30 | Fill #0 | Status: TO

## 2017-01-08 MED FILL — LEVOTHYROXINE 50 MCG TABLET: 50 | 30 days supply | Qty: 30 | Fill #3

## 2017-01-08 NOTE — BH Specialist Note (Signed)
Integrated Behavioral Health Initial Visit  MRN: [id number] Name: Linda Phelps   Session Start time: 2:20 PM Session End time: 2:50 PM Total time: 30 minutes  Type of Service: [place] Interpretor:No. Interpretor Name and Language: N/A   Warm Hand Off Completed.       SUBJECTIVE: Linda Phelps is a 46 y.o. female accompanied by patient. Patient was referred by FNP [place] for anxiety. Patient reports the following symptoms/concerns: difficulty concentrating, feelings of losing control, palpitations, racing thoughts Duration of problem: Ongoing; Severity of problem: moderate  OBJECTIVE: Mood: Anxious and Affect: Appropriate Risk of harm to self or others: No plan to harm self or others   LIFE CONTEXT: Family and Social: Pt has family and friends that reside nearby.  School/Work: Pt is employed and receives child support. No public benefits Self-Care: Pt practices deep breathing, prays, and talks with mother to cope with stressors. Has hx of medication management Life Changes: Pt grieving the loss of her nephew.   GOALS ADDRESSED: Patient will reduce symptoms of: anxiety and stress and increase knowledge and/or ability of: coping skills and also: Increase adequate support systems for patient/family   INTERVENTIONS: Solution-Focused Strategies, Supportive Counseling, Psychoeducation and/or Health Education and Link to [employer]  Standardized Assessments completed: GAD-7 and PHQ 2&9  ASSESSMENT: Patient currently experiencing anxiety triggered by chronic pain and stress from family and work. She reports difficulty concentrating, feelings of losing control, palpitations, and racing thoughts. Patient may benefit from psychotherapy and medication management. [place] educated pt on how stress can negatively impact one's mental and physical health. Pt successfully identified healthy coping skills to utilize on a routine basis. She is open  to medication management through PCP. LCSWA provided community resources for crisis intervention, psychotherapy, and grief support services.  PLAN: 1. Follow up with behavioral health clinician on : Pt was encouraged to contact LCSWA if symptoms worsen or fail to improve to schedule behavioral appointments at [facility]. 2. Behavioral recommendations: LCSWA recommends that pt apply healthy coping skills discussed, comply with medication management, and utilize provided resources. Pt is encouraged to schedule follow up appointment with LCSWA 3. Referral(s): [facility] (LME/Outside Clinic) 4. "From scale of 1-10, how likely are you to follow plan?": 8/10  Rebekah [place], LCSW 01/11/17 9:38 AM

## 2017-01-08 NOTE — Progress Notes (Signed)
Patient is here for establish care / DM & HTN

## 2017-01-08 NOTE — Progress Notes (Signed)
Subjective:  Patient ID: Linda Phelps, female    DOB: March 20, 1971  Age: 46 y.o. MRN: [id number]  CC: Diabetes and Hypertension   HPI Linda Phelps presents for follow up. History of DM, HTN, and GAD. Marland Kitchen She is not exercising and is not adherent to low salt diet.  She does not check BP at home. Cardiac symptoms none. Patient denies chest pain, chest pressure/discomfort, claudication, dyspnea, lower extremity edema, near-syncope and syncope.  Cardiovascular risk factors: diabetes mellitus, dyslipidemia, hypertension, obesity (BMI >= 30 kg/m2) and sedentary lifestyle. Use of agents associated with hypertension: none. History of target organ damage: heart failure. History of DM: Symptoms: blurred vision that began 6 months ago. Symptom is stable. She does report history of wearing reading glasses. Patient denies foot ulcerations, nausea, paresthesia of the feet, polydipsia, polyuria, visual disturbances and vomitting.  Evaluation to date has been included: fasting lipid panel and hemoglobin A1C.  Home sugars: patient checks CBG QAM, CBG range 80's-90's. Treatment to date: metformin. History of  anxiety disorder.  She has the following symptoms: difficulty concentrating, feelings of losing control, palpitations, racing thoughts. Onset of symptoms was approximately a few years ago, gradually worsening since that time. She denies current suicidal and homicidal ideation. Risk factors: negative life event death of nephew that she raised as a child Previous treatment includes BuSpar . She reports receiving counseling from her pastor only. She reports discontinuing medication due to risk of side effects. She also complaints of low back problems.  Symptoms have been present for 2 months and include pain in back and neck (aching in character; moderate in severity). Reports symptoms are worse at night. 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 Alleviating factors identifiable by patient are none. Exacerbating factors identifiable by patient are bending backwards. Treatments so far initiated by patient: aspercream and biofreeze with minimal relief of symptoms  Previous workup: none. Previous treatments: NSAIDS and muscle relaxants which provided adequate relief of symptoms.  Outpatient Medications Prior to Visit  Medication Sig Dispense Refill  . aspirin 81 MG tablet Take 81 mg by mouth daily.    . carvedilol (COREG) 12.5 MG tablet Take 1 tablet (12.5 mg total) by mouth 2 (two) times daily with a meal. 60 tablet 5  . fluticasone (FLONASE) 50 MCG/ACT nasal spray Place 2 sprays into both nostrils daily. 16 g 6  . furosemide (LASIX) 20 MG tablet TAKE 1 TABLET BY MOUTH 2 TIMES DAILY. 60 tablet 0  . levothyroxine (SYNTHROID, LEVOTHROID) 50 MCG tablet TAKE 1 TABLET BY MOUTH ONCE DAILY 30 tablet 3  . amLODipine (NORVASC) 10 MG tablet TAKE 1 TABLET BY MOUTH DAILY. 30 tablet 0  . lisinopril (PRINIVIL,ZESTRIL) 20 MG tablet Take 1 tablet (20 mg total) by mouth 2 (two) times daily. 60 tablet 5  . metFORMIN (GLUCOPHAGE XR) 500 MG 24 hr tablet Take 500 mg in morning and 1000 mg in evening 90 tablet 0  . amoxicillin (AMOXIL) 500 MG capsule Take 1 capsule (500 mg total) by mouth 3 (three) times daily. (Patient not taking: Reported on 01/08/2017) 30 capsule 0  . pantoprazole (PROTONIX) 20 MG tablet Take 1 tablet (20 mg total) by mouth daily. (Patient not taking: Reported on 01/08/2017) 14 tablet 0  . ranitidine (ZANTAC) 300 MG tablet Take 1 tablet (300 mg total) by mouth at bedtime. (Patient not taking: Reported on 07/11/2016) 30 tablet 5  . Vitamin D,  Ergocalciferol, (DRISDOL) 50000 units CAPS capsule Take 1 capsule (50,000 Units total) by mouth every 30 (thirty) days. (Patient not taking: Reported on 01/08/2017) 4 capsule 3  . methocarbamol (ROBAXIN) 500 MG tablet Take 1 tablet (500 mg total) by mouth 2 (two) times daily. (Patient not taking:  Reported on 01/08/2017) 30 tablet 2   No facility-administered medications prior to visit.     ROS Review of Systems  Constitutional: Negative.   Eyes: Negative.   Respiratory: Negative.   Cardiovascular: Negative.   Endocrine: Negative.   Musculoskeletal: Positive for back pain and myalgias.  Psychiatric/Behavioral: The patient is nervous/anxious.     Objective:  BP 120/81 (BP Location: Left Arm, Patient Position: Sitting, Cuff Size: Normal)   Pulse 60   Temp 98.1 F (36.7 C) (Oral)   Resp 18   Ht 5\' 1"  (1.549 m)   Wt 190 lb 9.6 oz (86.5 kg)   LMP 10/08/2013   SpO2 98%   BMI 36.01 kg/m   BP/Weight 01/08/2017 12/14/2016 11/18/5282  Systolic BP 132 440 -  Diastolic BP 81 80 -  Wt. (Lbs) 190.6 - 180  BMI 36.01 - 34.01    Physical Exam  Constitutional: She is oriented to person, place, and time. She appears well-developed and well-nourished.  Eyes: Pupils are equal, round, and reactive to light. Conjunctivae are normal.  Neck: Normal range of motion. Neck supple. No JVD present. No thyromegaly present.  Cardiovascular: Normal rate, regular rhythm, normal heart sounds and intact distal pulses.   Pulmonary/Chest: Effort normal and breath sounds normal.  Abdominal: Soft. Bowel sounds are normal.  Musculoskeletal:       Left shoulder: She exhibits pain. She exhibits normal range of motion.       Cervical back: She exhibits pain.       Lumbar back: She exhibits pain.  Neurological: She is alert and oriented to person, place, and time. She has normal reflexes.  Skin: Skin is warm and dry.  Psychiatric: Her mood appears anxious. She expresses no homicidal and no suicidal ideation. She expresses no suicidal plans and no homicidal plans.  Nursing note and vitals reviewed.  Diabetic Foot Exam - Simple   Simple Foot Form Diabetic Foot exam was performed with the following findings:  Yes 01/08/2017  2:00 PM  Visual Inspection No deformities, no ulcerations, no other skin breakdown  bilaterally:  Yes Sensation Testing Intact to touch and monofilament testing bilaterally:  Yes Pulse Check Posterior Tibialis and Dorsalis pulse intact bilaterally:  Yes Comments       Assessment & Plan:   Problem List Items Addressed This Visit      Cardiovascular and Mediastinum   Essential hypertension (Chronic)   Relevant Medications   lisinopril (PRINIVIL,ZESTRIL) 40 MG tablet   amLODipine (NORVASC) 10 MG tablet     Endocrine   Diabetes type 2, controlled (HCC) - Primary (Chronic)   Relevant Medications   metFORMIN (GLUCOPHAGE XR) 500 MG 24 hr tablet   lisinopril (PRINIVIL,ZESTRIL) 40 MG tablet   Other Relevant Orders   Glucose (CBG) (Completed)   HgB A1c (Completed)   POCT UA - Microalbumin (Completed)   Ambulatory referral to Ophthalmology   Lipid Panel (Completed)   Hypothyroidism (Chronic)   Relevant Orders   TSH (Completed)     Other   Generalized anxiety disorder (Chronic)   LCSW spoke with patient and provided resources.   Relevant Medications   citalopram (CELEXA) 20 MG tablet    Other Visit Diagnoses  Strain of neck muscle, sequela       Relevant Medications   methocarbamol (ROBAXIN) 500 MG tablet   acetaminophen (TYLENOL) 500 MG tablet   Other Relevant Orders   Ambulatory referral to Physical Therapy   Chronic midline low back pain without sciatica       Relevant Medications   methocarbamol (ROBAXIN) 500 MG tablet   acetaminophen (TYLENOL) 500 MG tablet   Other Relevant Orders   Ambulatory referral to Physical Therapy      Meds ordered this encounter  Medications  . citalopram (CELEXA) 20 MG tablet    Sig: Take 1 tablet (20 mg total) by mouth daily.    Dispense:  30 tablet    Refill:  1    Order Specific Question:   Supervising Provider    Answer:   Tresa Garter [id number]  . methocarbamol (ROBAXIN) 500 MG tablet    Sig: Take 1 tablet (500 mg total) by mouth every 8 (eight) hours as needed for muscle spasms.    Dispense:  30  tablet    Refill:  2    Order Specific Question:   Supervising Provider    Answer:   Tresa Garter [id number]  . acetaminophen (TYLENOL) 500 MG tablet    Sig: Take 2 tablets (1,000 mg total) by mouth every 6 (six) hours as needed for mild pain or moderate pain.    Dispense:  30 tablet    Refill:  0    Order Specific Question:   Supervising Provider    Answer:   Tresa Garter [id number]  . metFORMIN (GLUCOPHAGE XR) 500 MG 24 hr tablet    Sig: Take 500 mg in morning and 1000 mg in evening    Dispense:  180 tablet    Refill:  3    Must have office visit for refills    Order Specific Question:   Supervising Provider    Answer:   Tresa Garter [id number]  . lisinopril (PRINIVIL,ZESTRIL) 40 MG tablet    Sig: Take 1 tablet (40 mg total) by mouth daily.    Dispense:  90 tablet    Refill:  3    Must have office visit for refills    Order Specific Question:   Supervising Provider    Answer:   Tresa Garter [id number]  . amLODipine (NORVASC) 10 MG tablet    Sig: Take 1 tablet (10 mg total) by mouth daily.    Dispense:  90 tablet    Refill:  3    Must have office visit for refills    Order Specific Question:   Supervising Provider    Answer:   Tresa Garter [[id number]]    Follow-up: Return in about 8 weeks (around 03/05/2017) for Anxiety.   Alfonse Spruce FNP

## 2017-01-09 LAB — LIPID PANEL
CHOLESTEROL TOTAL: 180 mg/dL (ref 100–199)
Chol/HDL Ratio: 3.2 ratio (ref 0.0–4.4)
HDL: 57 mg/dL (ref >39)
LDL Calculated: 86 mg/dL (ref 0–99)
TRIGLYCERIDES: 186 mg/dL — AB (ref 0–149)
VLDL Cholesterol Cal: 37 mg/dL (ref 5–40)

## 2017-01-09 LAB — TSH: TSH: 1.21 u[IU]/mL (ref 0.450–4.500)

## 2017-01-11 MED FILL — FLUTICASONE PROP 50 MCG SPR: 50 | 30 days supply | Qty: 16 | Fill #0 | Status: TO

## 2017-01-11 MED FILL — ?FUROSEMIDE 20 MG TABLET: 20 | 30 days supply | Qty: 60 | Fill #0

## 2017-01-16 ENCOUNTER — Telehealth: Payer: Self-pay

## 2017-01-16 ENCOUNTER — Other Ambulatory Visit: Payer: Self-pay | Admitting: Family Medicine

## 2017-01-16 DIAGNOSIS — E782 Mixed hyperlipidemia: Secondary | ICD-10-CM

## 2017-01-16 DIAGNOSIS — E039 Hypothyroidism, unspecified: Secondary | ICD-10-CM

## 2017-01-16 MED ORDER — LEVOTHYROXINE SODIUM 50 MCG PO TABS: 50.0000 ug | ORAL_TABLET | Freq: Every day | ORAL | 5 refills | Status: DC

## 2017-01-16 MED ORDER — PRAVASTATIN SODIUM 20 MG PO TABS: 20.0000 mg | ORAL_TABLET | Freq: Every day | ORAL | 3 refills | Status: DC

## 2017-01-16 NOTE — Telephone Encounter (Signed)
-----   Message from Alfonse Spruce, [place] sent at 01/16/2017 12:29 PM EDT ----- Lipid levels were elevated. This can increase your risk of heart disease. You will be prescribed pravastatin. Recommend follow up in 3 months. Start eating a diet low in saturated fat. Limit your intake of fried foods, red meats, and whole milk. Increase activity. Thyroid function is normal on current dosage of levothyroxine.

## 2017-01-16 NOTE — Telephone Encounter (Signed)
CMA call regarding lab results   Patient Verify DOB   Patient was aware and understood  

## 2017-01-16 NOTE — Progress Notes (Signed)
Spoke with patient, patient stated that her pervious PCP prescribe her pravastatin and she could not take that medication.

## 2017-01-17 ENCOUNTER — Other Ambulatory Visit: Payer: Self-pay | Admitting: Family Medicine

## 2017-01-19 ENCOUNTER — Telehealth: Payer: Self-pay

## 2017-01-19 NOTE — Telephone Encounter (Signed)
-----   Message from Alfonse Spruce, [place] sent at 01/19/2017  1:20 PM EDT ----- Please ask if she had any side effects with pravastatin use. Also ask her if she has tried rosuvastatin (crestor)?   Fredia Beets FNP-BC   ----- Message ----- From: Marvetta Gibbons, CMA Sent: 01/16/2017  12:41 PM To: Alfonse Spruce, FNP  Spoke with patient, patient stated that her pervious PCP prescribe her pravastatin and she could not take that medication.

## 2017-01-19 NOTE — Telephone Encounter (Signed)
CMA call regarding f/up with medication   Patient did not answer & unable to leave message

## 2017-01-20 ENCOUNTER — Other Ambulatory Visit: Payer: Self-pay | Admitting: Family Medicine

## 2017-01-20 DIAGNOSIS — K219 Gastro-esophageal reflux disease without esophagitis: Secondary | ICD-10-CM

## 2017-01-20 DIAGNOSIS — I10 Essential (primary) hypertension: Secondary | ICD-10-CM

## 2017-01-20 DIAGNOSIS — E669 Obesity, unspecified: Secondary | ICD-10-CM

## 2017-01-20 DIAGNOSIS — E119 Type 2 diabetes mellitus without complications: Secondary | ICD-10-CM

## 2017-02-01 MED FILL — LISINOPRIL 40 MG TABLET: 40 | 30 days supply | Qty: 30 | Fill #0

## 2017-02-01 MED FILL — AMLODIPINE BESYLATE 10 MG T: 10 | 30 days supply | Qty: 30 | Fill #0

## 2017-02-05 ENCOUNTER — Telehealth: Payer: Self-pay | Admitting: Family Medicine

## 2017-02-05 NOTE — Telephone Encounter (Signed)
CMA call regarding advice about medications  Patient did not answer & unable to leave message

## 2017-02-05 NOTE — Telephone Encounter (Signed)
Recommend dietary interventions for now, start eating a diet low in saturated fat. Limit your intake of fried foods, red meats, and whole milk. Follow up in 6 months, if cholesterol levels still elevated recommend a medication to help lower levels. Some symptoms such as chest heaviness could be attributed to anxiety as well. Follow up if symptom does not improve.

## 2017-02-05 NOTE — Telephone Encounter (Signed)
CMA call regarding advice with her medications   Patient was aware and understood

## 2017-02-05 NOTE — Telephone Encounter (Signed)
CMA call patient 2nd time  regarding Recommend dietary interventions for now, start eating a diet low in saturated fat. Limit your intake of fried foods, red meats, and whole milk. Follow up in 6 months, if cholesterol levels still elevated recommend a medication to help lower levels. Some symptoms such as chest heaviness could be attributed to anxiety as well. Follow up if symptom does not improve.  Patient did not answer & unable to leave message

## 2017-02-05 NOTE — Telephone Encounter (Signed)
Pt. Called stating that she was taking pravastatin and stated that the medication was not helping her. Pt. Requested to speak with the nurse. Please f/u

## 2017-02-05 NOTE — Telephone Encounter (Signed)
Pt. Called stating that she was taking pravastatin and stated that the medication was not helping her. Pt. Requested to speak with the nurse. Spoke with patient & patient stated that pravastatin makes her chest heavy I told her about her other option Crestor she said it has bad effects that she is not going to take something that it will have effect her.

## 2017-02-18 ENCOUNTER — Emergency Department (HOSPITAL_BASED_OUTPATIENT_CLINIC_OR_DEPARTMENT_OTHER)
Admission: EM | Admit: 2017-02-18 | Discharge: 2017-02-18 | Disposition: A | Discharge status: Home / Self Care | Payer: Self-pay | Attending: Emergency Medicine | Admitting: Emergency Medicine

## 2017-02-18 ENCOUNTER — Encounter (HOSPITAL_BASED_OUTPATIENT_CLINIC_OR_DEPARTMENT_OTHER): Payer: Self-pay | Admitting: Emergency Medicine

## 2017-02-18 DIAGNOSIS — Z7984 Long term (current) use of oral hypoglycemic drugs: Secondary | ICD-10-CM | POA: Insufficient documentation

## 2017-02-18 DIAGNOSIS — E039 Hypothyroidism, unspecified: Secondary | ICD-10-CM | POA: Insufficient documentation

## 2017-02-18 DIAGNOSIS — Z79899 Other long term (current) drug therapy: Secondary | ICD-10-CM | POA: Insufficient documentation

## 2017-02-18 DIAGNOSIS — I509 Heart failure, unspecified: Secondary | ICD-10-CM | POA: Insufficient documentation

## 2017-02-18 DIAGNOSIS — Z7902 Long term (current) use of antithrombotics/antiplatelets: Secondary | ICD-10-CM | POA: Insufficient documentation

## 2017-02-18 DIAGNOSIS — I110 Hypertensive heart disease with heart failure: Secondary | ICD-10-CM | POA: Insufficient documentation

## 2017-02-18 DIAGNOSIS — Z7982 Long term (current) use of aspirin: Secondary | ICD-10-CM | POA: Insufficient documentation

## 2017-02-18 DIAGNOSIS — E119 Type 2 diabetes mellitus without complications: Secondary | ICD-10-CM | POA: Insufficient documentation

## 2017-02-18 DIAGNOSIS — R079 Chest pain, unspecified: Secondary | ICD-10-CM | POA: Insufficient documentation

## 2017-02-18 LAB — COMPREHENSIVE METABOLIC PANEL
ALBUMIN: 3.8 g/dL (ref 3.5–5.0)
ALK PHOS: 52 U/L (ref 38–126)
ALT: 14 U/L (ref 14–54)
AST: 20 U/L (ref 15–41)
Anion gap: 8 (ref 5–15)
BUN: 15 mg/dL (ref 6–20)
CHLORIDE: 105 mmol/L (ref 101–111)
CO2: 26 mmol/L (ref 22–32)
CREATININE: 0.89 mg/dL (ref 0.44–1.00)
Calcium: 8.8 mg/dL — ABNORMAL LOW (ref 8.9–10.3)
GFR calc Af Amer: >60 mL/min (ref >60)
Glucose, Bld: 107 mg/dL — ABNORMAL HIGH (ref 65–99)
Potassium: 3.3 mmol/L — ABNORMAL LOW (ref 3.5–5.1)
SODIUM: 139 mmol/L (ref 135–145)
Total Bilirubin: 0.5 mg/dL (ref 0.3–1.2)
Total Protein: 7.5 g/dL (ref 6.5–8.1)

## 2017-02-18 LAB — CBC WITH DIFFERENTIAL/PLATELET
BASOS PCT: 0 %
Basophils Absolute: 0 10*3/uL (ref 0.0–0.1)
Eosinophils Absolute: 0.1 10*3/uL (ref 0.0–0.7)
Eosinophils Relative: 3 %
HEMATOCRIT: 38 % (ref 36.0–46.0)
HEMOGLOBIN: 12.7 g/dL (ref 12.0–15.0)
LYMPHS PCT: 48 %
Lymphs Abs: 2.5 10*3/uL (ref 0.7–4.0)
MCH: 29.8 pg (ref 26.0–34.0)
MCHC: 33.4 g/dL (ref 30.0–36.0)
MCV: 89.2 fL (ref 78.0–100.0)
MONO ABS: 0.5 10*3/uL (ref 0.1–1.0)
MONOS PCT: 9 %
NEUTROS PCT: 40 %
Neutro Abs: 2.1 10*3/uL (ref 1.7–7.7)
Platelets: 293 10*3/uL (ref 150–400)
RBC: 4.26 MIL/uL (ref 3.87–5.11)
RDW: 12.9 % (ref 11.5–15.5)
WBC: 5.2 10*3/uL (ref 4.0–10.5)

## 2017-02-18 LAB — TROPONIN I: Troponin I: <0.03 ng/mL (ref <0.03)

## 2017-02-18 LAB — LIPASE, BLOOD: LIPASE: 38 U/L (ref 11–51)

## 2017-02-18 MED ORDER — FAMOTIDINE 20 MG PO TABS
20.0000 mg | ORAL_TABLET | Freq: Once | ORAL | Status: AC
  Administered 2017-02-18: 20 mg via ORAL
  Filled 2017-02-18: qty 1

## 2017-02-18 MED ORDER — FAMOTIDINE 20 MG PO TABS: 20.0000 mg | ORAL_TABLET | Freq: Two times a day (BID) | ORAL | 1 refills | Status: DC

## 2017-02-18 MED ORDER — GI COCKTAIL ~~LOC~~
30.0000 mL | Freq: Once | ORAL | Status: AC
  Administered 2017-02-18: 30 mL via ORAL
  Filled 2017-02-18: qty 30

## 2017-02-18 NOTE — Discharge Instructions (Signed)
1. Schedule a follow-up appointment with the cardiology clinic for chest pain.

## 2017-02-18 NOTE — ED Provider Notes (Signed)
[facility] Provider Note   CSN: [id number] Arrival date & time: 02/18/17  0720     History   Chief Complaint Chief Complaint  Patient presents with  . neck pain    HPI Linda Phelps is a 46 y.o. female.  HPI Patient reports that she gets pain and discomfort that goes through most of her back, diffusely through her chest and shoulders and up in her neck. She describes discomfort that involves really the entirety of her trunk. This has been a persistent and recurrent problem. She does describe it as being a mostly daily type of problem. She reports she is just tired of having pain all the time. 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 After much discussion about the history of this pain and review systems, I asked what the patient's main concern and worry was that brings her into the hospital. She became very tearful and her friend explained that she worries that there is something wrong with her heart in that she is going to die. She worries that something will happen to her and there won't be anyone to take care for children. The patient's companion elaborates that she needs to be more compliant with her diet because is probably not helping her diagnosed reflux. Patient denies fever, productive cough, vomiting or diarrhea. She denies lower extremity swelling or calf pain. The pain that she experiences does not radiate in her legs. She indicates it seems to stop and concentrated around her SI region of her back. Patient reports that she is compliant with her routine healthcare screening. She reports she has had her mammographies and does her regular PCP follow-ups. She for she has addressed this pain and concerns with her new PCP but does not feel that they're really being evaluated. Past Medical History:  Diagnosis Date  . CHF (congestive heart failure) ([place])   . Diabetes mellitus without complication ([place])   . Ectopic pregnancy   . High  cholesterol   . Hypertension   . Obesity   . Thyroid disease     Patient Active Problem List   Diagnosis Date Noted  . Chronic right-sided low back pain without sciatica 07/11/2016  . Acute non intractable tension-type headache 05/18/2016  . Abnormal chest x-ray 05/18/2016  . Vitamin D insufficiency 01/13/2016  . Menstrual cycle problem 01/11/2016  . Hordeolum externum (stye) 01/11/2016  . Obesity (BMI 30.0-34.9) 10/01/2015  . Palpitations 07/26/2015  . GERD (gastroesophageal reflux disease) 07/26/2015  . Generalized anxiety disorder 07/26/2015  . Poor dentition 04/13/2015  . Cardiomegaly 09/23/2014  . Diabetes type 2, controlled ([place]) 07/03/2014  . Essential hypertension 07/03/2014  . Hyperlipidemia 07/03/2014  . Hypothyroidism 07/03/2014    Past Surgical History:  Procedure Laterality Date  . APPENDECTOMY    . CHOLECYSTECTOMY      OB History    No data available       Home Medications    Prior to Admission medications   Medication Sig Start Date End Date Taking? Authorizing Provider  carvedilol (COREG) 12.5 MG tablet Take 1 tablet (12.5 mg total) by mouth 2 (two) times daily with a meal. 07/11/16  Yes Funches, Josalyn, MD  furosemide (LASIX) 20 MG tablet TAKE 1 TABLET BY MOUTH 2 TIMES DAILY. 12/29/16  Yes Funches, Josalyn, MD  lisinopril (PRINIVIL,ZESTRIL) 40 MG tablet Take 1 tablet (40 mg total) by mouth daily. 01/08/17  Yes Fredia Beets R, FNP  metFORMIN (GLUCOPHAGE XR) 500 MG 24 hr  tablet Take 500 mg in morning and 1000 mg in evening 01/08/17  Yes Hairston, Mandesia R, FNP  acetaminophen (TYLENOL) 500 MG tablet Take 2 tablets (1,000 mg total) by mouth every 6 (six) hours as needed for mild pain or moderate pain. 01/08/17   Alfonse Spruce, FNP  amLODipine (NORVASC) 10 MG tablet Take 1 tablet (10 mg total) by mouth daily. 01/08/17   Alfonse Spruce, FNP  amoxicillin (AMOXIL) 500 MG capsule Take 1 capsule (500 mg total) by mouth 3 (three) times  daily. Patient not taking: Reported on 01/08/2017 07/11/16   Boykin Nearing, MD  aspirin 81 MG tablet Take 81 mg by mouth daily.    [provider]  citalopram (CELEXA) 20 MG tablet Take 1 tablet (20 mg total) by mouth daily. 01/08/17   Alfonse Spruce, FNP  famotidine (PEPCID) 20 MG tablet Take 1 tablet (20 mg total) by mouth 2 (two) times daily. 02/18/17   Charlesetta Shanks, MD  fluticasone (FLONASE) 50 MCG/ACT nasal spray Place 2 sprays into both nostrils daily. 07/11/16   Funches, Adriana Mccallum, MD  levothyroxine (SYNTHROID, LEVOTHROID) 50 MCG tablet Take 1 tablet (50 mcg total) by mouth daily. 01/16/17   Alfonse Spruce, FNP  methocarbamol (ROBAXIN) 500 MG tablet Take 1 tablet (500 mg total) by mouth every 8 (eight) hours as needed for muscle spasms. 01/08/17   Alfonse Spruce, FNP  pantoprazole (PROTONIX) 20 MG tablet Take 1 tablet (20 mg total) by mouth daily. Patient not taking: Reported on 01/08/2017 12/14/16   Arlean Hopping C, PA-C  pravastatin (PRAVACHOL) 20 MG tablet Take 1 tablet (20 mg total) by mouth daily. 01/16/17   Alfonse Spruce, FNP  ranitidine (ZANTAC) 300 MG tablet TAKE 1 TABLET BY MOUTH AT BEDTIME. 01/23/17   Hairston, Toy Baker R, FNP  Vitamin D, Ergocalciferol, (DRISDOL) 50000 units CAPS capsule Take 1 capsule (50,000 Units total) by mouth every 30 (thirty) days. Patient not taking: Reported on 01/08/2017 07/11/16   Boykin Nearing, MD    Family History Family History  Problem Relation Age of Onset  . Diabetes Mother   . Diabetes Maternal Grandmother   . Colon cancer Maternal Uncle 96    Social History Social History  Substance Use Topics  . Smoking status: Never Smoker  . Smokeless tobacco: Never Used  . Alcohol use 0.0 oz/week     Comment: occ     Allergies   Ibuprofen   Review of Systems Review of Systems 10 Systems reviewed and are negative for acute change except as noted in the HPI.   Physical Exam Updated Vital Signs BP 131/72    Pulse 64   Temp 98.4 F (36.9 C) (Oral)   Resp 12   Ht 5\' 1"  (1.549 m)   Wt 90.7 kg (200 lb)   LMP 10/08/2013   SpO2 94%   BMI 37.79 kg/m   Physical Exam  Constitutional: She is oriented to person, place, and time. She appears well-developed and well-nourished. No distress.  Patient is alert and nontoxic. No respiratory distress. General condition as well. Obesity.  HENT:  Head: Normocephalic and atraumatic.  Nose: Nose normal.  Mouth/Throat: Oropharynx is clear and moist.  Eyes: Pupils are equal, round, and reactive to light. Conjunctivae and EOM are normal.  Neck: Neck supple. No thyromegaly present.  Cardiovascular: Normal rate, regular rhythm, normal heart sounds and intact distal pulses.   No murmur heard. Pulmonary/Chest: Effort normal and breath sounds normal. No respiratory distress.  Abdominal: Soft. She  exhibits no distension. There is no tenderness. There is no guarding.  Musculoskeletal: Normal range of motion. She exhibits no edema.  Patient endorses some discomfort palpation in the paraspinous cervical muscles and trapezius bilaterally. When examining the back she indicates that a lot of her discomfort is in the SI region on the right. Patient has normal range of motion upper and lower extremities. There is no peripheral edema. No calf tenderness. No joint effusions.  Lymphadenopathy:    She has no cervical adenopathy.  Neurological: She is alert and oriented to person, place, and time. No cranial nerve deficit. She exhibits normal muscle tone. Coordination normal.  Skin: Skin is warm and dry.  Psychiatric: She has a normal mood and affect.  Nursing note and vitals reviewed.    ED Treatments / Results  Labs (all labs ordered are listed, but only abnormal results are displayed) Labs Reviewed  COMPREHENSIVE METABOLIC PANEL - Abnormal; Notable for the following:       Result Value   Potassium 3.3 (*)    Glucose, Bld 107 (*)    Calcium 8.8 (*)    All other  components within normal limits  LIPASE, BLOOD  CBC WITH DIFFERENTIAL/PLATELET  TROPONIN I    EKG  EKG Interpretation  Date/Time:  Sunday February 18 2017 08:17:52 EDT Ventricular Rate:  59 PR Interval:    QRS Duration: 98 QT Interval:  409 QTC Calculation: 406 R Axis:   57 Text Interpretation:  Sinus rhythm Borderline repolarization abnormality Baseline wander in lead(s) V6 no STEMI. no change from old Confirmed by Charlesetta Shanks [phone]) on 02/18/2017 9:35:01 AM       Radiology No results found.  Procedures Procedures (including critical care time)  Medications Ordered in ED Medications  gi cocktail (Maalox,Lidocaine,Donnatal) (30 mLs Oral Given 02/18/17 0819)  famotidine (PEPCID) tablet 20 mg (20 mg Oral Given 02/18/17 0819)     Initial Impression / Assessment and Plan / ED Course  I have reviewed the triage vital signs and the nursing notes.  Pertinent labs & imaging results that were available during my care of the patient were reviewed by me and considered in my medical decision making (see chart for details).      Final Clinical Impressions(s) / ED Diagnoses   Final diagnoses:  Chest pain, unspecified type   Patient has description of very nonlocalizing symptoms that really encompass the entirety of her trunk. The patient's main worry however appears to be cardiac. At this time, I do not find any indication of acute MI. EKG is unchanged in troponin is normal. Patient had chest x-ray within the past 2 months with no structural anomaly. By physical examination and symptoms I did not think that repeat chest x-rays indicated at this time. There is a component of this that suggests reflux. We'll have the patient continue her Protonix and add twice a day Pepcid. Patient does have cardiac risk factors. Plan will be to follow-up with cardiology for further evaluation and possible stress testing if indicated. I also counseled the patient to continue working with the PCP  regarding the diffuse and ongoing nature of her symptoms. At this time, I do not find symptoms indicating further emergent diagnostic workup. Patient is advised to return if symptoms should be worsening or changing or other concerns develop. New Prescriptions New Prescriptions   FAMOTIDINE (PEPCID) 20 MG TABLET    Take 1 tablet (20 mg total) by mouth 2 (two) times daily.     Charlesetta Shanks, MD 02/18/17  0938  

## 2017-02-18 NOTE — ED Triage Notes (Signed)
Neck pain radiating into back and buttocks, going on for months, seen multiple times for same. Pt states worse today.

## 2017-02-20 ENCOUNTER — Other Ambulatory Visit: Payer: Self-pay | Admitting: Family Medicine

## 2017-02-20 DIAGNOSIS — I10 Essential (primary) hypertension: Secondary | ICD-10-CM

## 2017-02-20 MED ORDER — CARVEDILOL 12.5 MG PO TABS: 12.5000 mg | ORAL_TABLET | Freq: Two times a day (BID) | ORAL | 2 refills | Status: DC

## 2017-02-20 MED FILL — ?CARVEDILOL 12.5 MG TABLET: 12.5 | 30 days supply | Qty: 60 | Fill #0 | Status: TO

## 2017-02-21 MED FILL — METFORMIN HCL ER 500 MG TAB: 500 | 30 days supply | Qty: 90 | Fill #0 | Status: TO

## 2017-02-21 MED FILL — LEVOTHYROXINE 50 MCG TABLET: 50 | 30 days supply | Qty: 30 | Fill #0 | Status: TO

## 2017-03-06 ENCOUNTER — Other Ambulatory Visit: Payer: Self-pay | Admitting: Pharmacist

## 2017-03-06 DIAGNOSIS — I10 Essential (primary) hypertension: Secondary | ICD-10-CM

## 2017-03-06 MED ORDER — FUROSEMIDE 20 MG PO TABS: 20.0000 mg | ORAL_TABLET | Freq: Two times a day (BID) | ORAL | 0 refills | Status: DC

## 2017-03-06 MED FILL — LISINOPRIL 40 MG TABLET: 40 | 30 days supply | Qty: 30 | Fill #1 | Status: TO

## 2017-03-06 MED FILL — ?FUROSEMIDE 20 MG TABLET: 20 | 30 days supply | Qty: 60 | Fill #0

## 2017-03-06 MED FILL — AMLODIPINE BESYLATE 10 MG T: 10 | 30 days supply | Qty: 30 | Fill #1 | Status: TO

## 2017-03-07 ENCOUNTER — Emergency Department (HOSPITAL_BASED_OUTPATIENT_CLINIC_OR_DEPARTMENT_OTHER)
Admission: EM | Admit: 2017-03-07 | Discharge: 2017-03-07 | Disposition: A | Discharge status: Home / Self Care | Payer: Self-pay | Attending: Emergency Medicine | Admitting: Emergency Medicine

## 2017-03-07 ENCOUNTER — Encounter (HOSPITAL_BASED_OUTPATIENT_CLINIC_OR_DEPARTMENT_OTHER): Payer: Self-pay | Admitting: Emergency Medicine

## 2017-03-07 DIAGNOSIS — Z7982 Long term (current) use of aspirin: Secondary | ICD-10-CM | POA: Insufficient documentation

## 2017-03-07 DIAGNOSIS — I119 Hypertensive heart disease without heart failure: Secondary | ICD-10-CM | POA: Insufficient documentation

## 2017-03-07 DIAGNOSIS — M549 Dorsalgia, unspecified: Secondary | ICD-10-CM | POA: Insufficient documentation

## 2017-03-07 DIAGNOSIS — E119 Type 2 diabetes mellitus without complications: Secondary | ICD-10-CM | POA: Insufficient documentation

## 2017-03-07 DIAGNOSIS — E039 Hypothyroidism, unspecified: Secondary | ICD-10-CM | POA: Insufficient documentation

## 2017-03-07 DIAGNOSIS — G8929 Other chronic pain: Secondary | ICD-10-CM | POA: Insufficient documentation

## 2017-03-07 DIAGNOSIS — R0602 Shortness of breath: Secondary | ICD-10-CM | POA: Insufficient documentation

## 2017-03-07 DIAGNOSIS — Z79899 Other long term (current) drug therapy: Secondary | ICD-10-CM | POA: Insufficient documentation

## 2017-03-07 HISTORY — DX: Low back pain: M54.5

## 2017-03-07 HISTORY — DX: Low back pain, unspecified: M54.50

## 2017-03-07 MED ORDER — DICLOFENAC POTASSIUM 50 MG PO TABS: 50.0000 mg | ORAL_TABLET | Freq: Three times a day (TID) | ORAL | 0 refills | Status: AC

## 2017-03-07 NOTE — Discharge Instructions (Signed)
Take tylenol 1000mg (2 extra strength) four times a day.   Discuss this with your family doc.  Return for worsening pain, chest pain on exercise, sob.

## 2017-03-07 NOTE — ED Provider Notes (Signed)
[facility] EMERGENCY DEPARTMENT Provider Note   CSN: [id number] Arrival date & time: 03/07/17  0759     History   Chief Complaint Chief Complaint  Patient presents with  . Back Pain  . Respiratory Distress    HPI Pola Furno is a 46 y.o. female.  46 yo F with a chief complaint of diffuse back pain. This been going on for quite some time. Worse with movement palpation twisting. Patient thinks she maybe hurting doing some heavy lifting at work. She denies loss of bowel or bladder. She has had some shortness of breath which she attributes to her back pain. She denies exertional symptoms. Denies nausea or vomiting. Denies chest pain. She has had some edema that she attributes to not being on her Lasix for the past couple days.   The history is provided by the patient.  Back Pain   This is a chronic problem. The current episode started more than 1 week ago. The problem occurs constantly. The problem has not changed since onset.The pain is associated with no known injury. The pain is present in the thoracic spine and lumbar spine. The quality of the pain is described as stabbing, shooting and cramping. The pain does not radiate. The pain is at a severity of 8/10. The pain is moderate. The symptoms are aggravated by bending and twisting. Pertinent negatives include no chest pain, no fever, no headaches and no dysuria. She has tried nothing for the symptoms. The treatment provided no relief. Risk factors include obesity.    Past Medical History:  Diagnosis Date  . CHF (congestive heart failure) ([place])   . Diabetes mellitus without complication ([place])   . Ectopic pregnancy   . High cholesterol   . Hypertension   . Low back pain   . Obesity   . Thyroid disease     Patient Active Problem List   Diagnosis Date Noted  . Chronic right-sided low back pain without sciatica 07/11/2016  . Acute non intractable tension-type headache 05/18/2016  . Abnormal chest x-ray 05/18/2016  .  Vitamin D insufficiency 01/13/2016  . Menstrual cycle problem 01/11/2016  . Hordeolum externum (stye) 01/11/2016  . Obesity (BMI 30.0-34.9) 10/01/2015  . Palpitations 07/26/2015  . GERD (gastroesophageal reflux disease) 07/26/2015  . Generalized anxiety disorder 07/26/2015  . Poor dentition 04/13/2015  . Cardiomegaly 09/23/2014  . Diabetes type 2, controlled ([place]) 07/03/2014  . Essential hypertension 07/03/2014  . Hyperlipidemia 07/03/2014  . Hypothyroidism 07/03/2014    Past Surgical History:  Procedure Laterality Date  . APPENDECTOMY    . CHOLECYSTECTOMY      OB History    No data available       Home Medications    Prior to Admission medications   Medication Sig Start Date End Date Taking? Authorizing Provider  furosemide (LASIX) 20 MG tablet Take 1 tablet (20 mg total) by mouth 2 (two) times daily. 03/06/17  Yes Arnoldo Morale, MD  acetaminophen (TYLENOL) 500 MG tablet Take 2 tablets (1,000 mg total) by mouth every 6 (six) hours as needed for mild pain or moderate pain. 01/08/17   Alfonse Spruce, FNP  amLODipine (NORVASC) 10 MG tablet Take 1 tablet (10 mg total) by mouth daily. 01/08/17   Alfonse Spruce, FNP  aspirin 81 MG tablet Take 81 mg by mouth daily.    [provider]  carvedilol (COREG) 12.5 MG tablet Take 1 tablet (12.5 mg total) by mouth 2 (two) times daily with a meal. 02/20/17  Alfonse Spruce, FNP  citalopram (CELEXA) 20 MG tablet Take 1 tablet (20 mg total) by mouth daily. 01/08/17   Alfonse Spruce, FNP  diclofenac (CATAFLAM) 50 MG tablet Take 1 tablet (50 mg total) by mouth 3 (three) times daily. 03/07/17 04/06/17  Deno Etienne, DO  famotidine (PEPCID) 20 MG tablet Take 1 tablet (20 mg total) by mouth 2 (two) times daily. 02/18/17   Charlesetta Shanks, MD  fluticasone (FLONASE) 50 MCG/ACT nasal spray Place 2 sprays into both nostrils daily. 07/11/16   Funches, Adriana Mccallum, MD  levothyroxine (SYNTHROID, LEVOTHROID) 50 MCG tablet Take 1 tablet  (50 mcg total) by mouth daily. 01/16/17   Alfonse Spruce, FNP  lisinopril (PRINIVIL,ZESTRIL) 40 MG tablet Take 1 tablet (40 mg total) by mouth daily. 01/08/17   Alfonse Spruce, FNP  metFORMIN (GLUCOPHAGE XR) 500 MG 24 hr tablet Take 500 mg in morning and 1000 mg in evening 01/08/17   Hairston, Toy Baker R, FNP  methocarbamol (ROBAXIN) 500 MG tablet Take 1 tablet (500 mg total) by mouth every 8 (eight) hours as needed for muscle spasms. 01/08/17   Alfonse Spruce, FNP  pravastatin (PRAVACHOL) 20 MG tablet Take 1 tablet (20 mg total) by mouth daily. 01/16/17   Alfonse Spruce, FNP  ranitidine (ZANTAC) 300 MG tablet TAKE 1 TABLET BY MOUTH AT BEDTIME. 01/23/17   Alfonse Spruce, FNP    Family History Family History  Problem Relation Age of Onset  . Diabetes Mother   . Diabetes Maternal Grandmother   . Colon cancer Maternal Uncle 54    Social History Social History  Substance Use Topics  . Smoking status: Never Smoker  . Smokeless tobacco: Never Used  . Alcohol use 0.0 oz/week     Comment: occ     Allergies   Ibuprofen   Review of Systems Review of Systems  Constitutional: Negative for chills and fever.       Subjective edema  HENT: Negative for congestion and rhinorrhea.   Eyes: Negative for redness and visual disturbance.  Respiratory: Positive for shortness of breath. Negative for wheezing.   Cardiovascular: Negative for chest pain and palpitations.  Gastrointestinal: Negative for nausea and vomiting.  Genitourinary: Negative for dysuria and urgency.  Musculoskeletal: Positive for back pain. Negative for arthralgias and myalgias.  Skin: Negative for pallor and wound.  Neurological: Negative for dizziness and headaches.     Physical Exam Updated Vital Signs BP (!) 150/74   Pulse 69   Temp 97.7 F (36.5 C) (Oral)   Resp 19   Ht 5\' 1"  (1.549 m)   Wt 90.7 kg (200 lb)   LMP 10/08/2013   SpO2 100%   BMI 37.79 kg/m   Physical Exam    Constitutional: She is oriented to person, place, and time. She appears well-developed and well-nourished. No distress.  HENT:  Head: Normocephalic and atraumatic.  Eyes: Pupils are equal, round, and reactive to light. EOM are normal.  Neck: Normal range of motion. Neck supple.  Cardiovascular: Normal rate and regular rhythm.  Exam reveals no gallop and no friction rub.   No murmur heard. Pulmonary/Chest: Effort normal. She has no wheezes. She has no rales.  Abdominal: Soft. She exhibits no distension and no mass. There is no tenderness. There is no guarding.  Musculoskeletal: She exhibits tenderness. She exhibits no edema.  Mild diffuse tenderness about the entire back.  Neurological: She is alert and oriented to person, place, and time.  Skin: Skin is warm and  dry. She is not diaphoretic.  Psychiatric: She has a normal mood and affect. Her behavior is normal.  Nursing note and vitals reviewed.    ED Treatments / Results  Labs (all labs ordered are listed, but only abnormal results are displayed) Labs Reviewed - No data to display  EKG  EKG Interpretation None       Radiology No results found.  Procedures Procedures (including critical care time)  Medications Ordered in ED Medications - No data to display   Initial Impression / Assessment and Plan / ED Course  I have reviewed the triage vital signs and the nursing notes.  Pertinent labs & imaging results that were available during my care of the patient were reviewed by me and considered in my medical decision making (see chart for details).     46 yo F with a chief complaint of back pain. This is likely muscular skeletal. The patient was describing some shortness of breath though seems due to her back pain. She has no central pain or pressure. No exertional symptoms. EKG is unchanged. I also do not think this is related to intellectual and abnormality with her Lasix. Localized to the back and worse with twisting  movement and palpation. We'll treat conservatively. She has some edema with ibuprofen so I'll try diclofenac. PCP Follow-up.   9:38 AM:  I have discussed the diagnosis/risks/treatment options with the patient and family and believe the pt to be eligible for discharge home to follow-up with PCP. We also discussed returning to the ED immediately if new or worsening sx occur. We discussed the sx which are most concerning (e.g., cauda equina symptoms. Central chest pain or pressure, exertional symptoms) that necessitate immediate return. Medications administered to the patient during their visit and any new prescriptions provided to the patient are listed below.  Medications given during this visit Medications - No data to display   The patient appears reasonably screen and/or stabilized for discharge and I doubt any other medical condition or other [facility] requiring further screening, evaluation, or treatment in the ED at this time prior to discharge.   Final Clinical Impressions(s) / ED Diagnoses   Final diagnoses:  Other acute back pain    New Prescriptions New Prescriptions   DICLOFENAC (CATAFLAM) 50 MG TABLET    Take 1 tablet (50 mg total) by mouth 3 (three) times daily.     Deno Etienne, DO 03/07/17 [phone]

## 2017-03-07 NOTE — ED Triage Notes (Signed)
Chronic lower back pain feels worse today.  Pt states she is out of her Lasix, last dose Saturday.  Pt relates she feels like she is retaining fluid, especially in her hands.  Also states she feels like she cannot take a deep breath easily.  Pt concerned that she may have fluid on her lungs.  Lungs clear.

## 2017-03-19 ENCOUNTER — Ambulatory Visit: Payer: Self-pay

## 2017-03-19 NOTE — Progress Notes (Deleted)
Patient ID: Linda Phelps, female   DOB: 1971/01/07, 46 y.o.   MRN: [id number] Recent ED visit for back pain. She was prescribed Diclofenac.   EKG with no new changes in ED.

## 2017-03-20 ENCOUNTER — Ambulatory Visit: Payer: Self-pay | Admitting: Nurse Practitioner

## 2017-04-11 ENCOUNTER — Other Ambulatory Visit: Payer: Self-pay

## 2017-04-11 ENCOUNTER — Emergency Department (HOSPITAL_BASED_OUTPATIENT_CLINIC_OR_DEPARTMENT_OTHER): Payer: Self-pay

## 2017-04-11 ENCOUNTER — Encounter (HOSPITAL_BASED_OUTPATIENT_CLINIC_OR_DEPARTMENT_OTHER): Payer: Self-pay | Admitting: Emergency Medicine

## 2017-04-11 ENCOUNTER — Emergency Department (HOSPITAL_BASED_OUTPATIENT_CLINIC_OR_DEPARTMENT_OTHER)
Admission: EM | Admit: 2017-04-11 | Discharge: 2017-04-11 | Disposition: A | Discharge status: Home / Self Care | Payer: Self-pay | Attending: Emergency Medicine | Admitting: Emergency Medicine

## 2017-04-11 DIAGNOSIS — Z7982 Long term (current) use of aspirin: Secondary | ICD-10-CM | POA: Insufficient documentation

## 2017-04-11 DIAGNOSIS — R0600 Dyspnea, unspecified: Secondary | ICD-10-CM | POA: Insufficient documentation

## 2017-04-11 DIAGNOSIS — E119 Type 2 diabetes mellitus without complications: Secondary | ICD-10-CM | POA: Insufficient documentation

## 2017-04-11 DIAGNOSIS — E039 Hypothyroidism, unspecified: Secondary | ICD-10-CM | POA: Insufficient documentation

## 2017-04-11 DIAGNOSIS — I110 Hypertensive heart disease with heart failure: Secondary | ICD-10-CM | POA: Insufficient documentation

## 2017-04-11 DIAGNOSIS — I509 Heart failure, unspecified: Secondary | ICD-10-CM | POA: Insufficient documentation

## 2017-04-11 DIAGNOSIS — R0789 Other chest pain: Secondary | ICD-10-CM | POA: Insufficient documentation

## 2017-04-11 DIAGNOSIS — R079 Chest pain, unspecified: Secondary | ICD-10-CM

## 2017-04-11 DIAGNOSIS — Z79899 Other long term (current) drug therapy: Secondary | ICD-10-CM | POA: Insufficient documentation

## 2017-04-11 DIAGNOSIS — Z7984 Long term (current) use of oral hypoglycemic drugs: Secondary | ICD-10-CM | POA: Insufficient documentation

## 2017-04-11 DIAGNOSIS — M79662 Pain in left lower leg: Secondary | ICD-10-CM | POA: Insufficient documentation

## 2017-04-11 LAB — BASIC METABOLIC PANEL
ANION GAP: 10 (ref 5–15)
BUN: 16 mg/dL (ref 6–20)
CALCIUM: 9.1 mg/dL (ref 8.9–10.3)
CO2: 27 mmol/L (ref 22–32)
CREATININE: 0.85 mg/dL (ref 0.44–1.00)
Chloride: 102 mmol/L (ref 101–111)
GLUCOSE: 102 mg/dL — AB (ref 65–99)
Potassium: 3.3 mmol/L — ABNORMAL LOW (ref 3.5–5.1)
Sodium: 139 mmol/L (ref 135–145)

## 2017-04-11 LAB — CBC
HCT: 38.2 % (ref 36.0–46.0)
HEMOGLOBIN: 12.9 g/dL (ref 12.0–15.0)
MCH: 29.9 pg (ref 26.0–34.0)
MCHC: 33.8 g/dL (ref 30.0–36.0)
MCV: 88.4 fL (ref 78.0–100.0)
PLATELETS: 316 10*3/uL (ref 150–400)
RBC: 4.32 MIL/uL (ref 3.87–5.11)
RDW: 12.5 % (ref 11.5–15.5)
WBC: 6.7 10*3/uL (ref 4.0–10.5)

## 2017-04-11 LAB — D-DIMER, QUANTITATIVE (NOT AT ARMC): D DIMER QUANT: 0.78 ug{FEU}/mL — AB (ref 0.00–0.50)

## 2017-04-11 LAB — TROPONIN I: Troponin I: <0.03 ng/mL (ref <0.03)

## 2017-04-11 LAB — BRAIN NATRIURETIC PEPTIDE: B Natriuretic Peptide: 7.2 pg/mL (ref 0.0–100.0)

## 2017-04-11 MED ORDER — POTASSIUM CHLORIDE CRYS ER 20 MEQ PO TBCR
20.0000 meq | EXTENDED_RELEASE_TABLET | Freq: Once | ORAL | Status: AC
  Administered 2017-04-11: 20 meq via ORAL
  Filled 2017-04-11: qty 1

## 2017-04-11 MED ORDER — IOPAMIDOL (ISOVUE-370) INJECTION 76%
100.0000 mL | Freq: Once | INTRAVENOUS | Status: AC | PRN
  Administered 2017-04-11: 65 mL via INTRAVENOUS

## 2017-04-11 NOTE — ED Triage Notes (Signed)
Pt states she has intermittent chest heaviness since yesterday.  Pt states she has had a cough with some lightheadedness.  Pt also states she has left calf pain.

## 2017-04-11 NOTE — ED Provider Notes (Signed)
Complains of anterior chest pain onset 2 days ago, constant except resolved when arrival here she is presently pain-free.  Symptoms worse with lying supine and improved with sitting upright.  Pain radiates to her throat and feels like GERD she is experienced in the past.  Pain is nonexertional she is treated herself with ranitidine and Pepcid.  No other associated symptoms.  On exam no distress lungs clear to auscultation heart regular rate and rhythm abdomen obese, nontender extremities without edema.  Skin warm dry Heart score equals 3.  She received oral potassium supplementation while here plan Maalox 2 tablespoons after meals and at bedtime.  Addition to usual medicines and follow-up with PMD Results for orders placed or performed during the hospital encounter of 87/56/43  Basic metabolic panel  Result Value Ref Range   Sodium 139 135 - 145 mmol/L   Potassium 3.3 (L) 3.5 - 5.1 mmol/L   Chloride 102 101 - 111 mmol/L   CO2 27 22 - 32 mmol/L   Glucose, Bld 102 (H) 65 - 99 mg/dL   BUN 16 6 - 20 mg/dL   Creatinine, Ser 0.85 0.44 - 1.00 mg/dL   Calcium 9.1 8.9 - 10.3 mg/dL   GFR calc non Af Amer >60 >60 mL/min   GFR calc Af Amer >60 >60 mL/min   Anion gap 10 5 - 15  CBC  Result Value Ref Range   WBC 6.7 4.0 - 10.5 K/uL   RBC 4.32 3.87 - 5.11 MIL/uL   Hemoglobin 12.9 12.0 - 15.0 g/dL   HCT 38.2 36.0 - 46.0 %   MCV 88.4 78.0 - 100.0 fL   MCH 29.9 26.0 - 34.0 pg   MCHC 33.8 30.0 - 36.0 g/dL   RDW 12.5 11.5 - 15.5 %   Platelets 316 150 - 400 K/uL  Troponin I  Result Value Ref Range   Troponin I <0.03 <0.03 ng/mL  D-dimer, quantitative (not at [facility])  Result Value Ref Range   D-Dimer, Quant 0.78 (H) 0.00 - 0.50 ug/mL-FEU  Brain natriuretic peptide  Result Value Ref Range   B Natriuretic Peptide 7.2 0.0 - 100.0 pg/mL   Ct Angio Chest Pe W/cm &/or Wo Cm  Result Date: 04/11/2017 CLINICAL DATA:  Left leg pain and elevated D-dimer. Clinical suspicion for pulmonary embolism. EXAM: CT  ANGIOGRAPHY CHEST WITH CONTRAST TECHNIQUE: Multidetector CT imaging of the chest was performed using the standard protocol during bolus administration of intravenous contrast. Multiplanar CT image reconstructions and MIPs were obtained to evaluate the vascular anatomy. CONTRAST:  26mL ISOVUE-370 IOPAMIDOL (ISOVUE-370) INJECTION 76% COMPARISON:  08/18/2009 FINDINGS: Cardiovascular: Satisfactory opacification of pulmonary arteries noted, and no pulmonary emboli identified. No evidence of thoracic aortic dissection or aneurysm. Mediastinum/Nodes: No masses or pathologically enlarged lymph nodes identified. Lungs/Pleura: No pulmonary mass, infiltrate, or effusion. Upper abdomen: No acute findings. Musculoskeletal: No suspicious bone lesions identified. Review of the MIP images confirms the above findings. IMPRESSION: Negative. No evidence of pulmonary embolism or other active disease. Electronically Signed   By: Earle Gell M.D.   On: 04/11/2017 20:14   US Venous Img Lower Unilateral Left  Result Date: 04/11/2017 CLINICAL DATA:  Left lower extremity pain for 2 days. Elevated D-dimer. Clinical suspicion for pulmonary embolism. EXAM: LEFT LOWER EXTREMITY VENOUS DOPPLER ULTRASOUND TECHNIQUE: Gray-scale sonography with graded compression, as well as color Doppler and duplex ultrasound were performed to evaluate the lower extremity deep venous systems from the level of the common femoral vein and including the common femoral,  femoral, profunda femoral, popliteal and calf veins including the posterior tibial, peroneal and gastrocnemius veins when visible. The superficial great saphenous vein was also interrogated. Spectral Doppler was utilized to evaluate flow at rest and with distal augmentation maneuvers in the common femoral, femoral and popliteal veins. COMPARISON:  None. FINDINGS: Contralateral Common Femoral Vein: Respiratory phasicity is normal and symmetric with the symptomatic side. No evidence of thrombus.  Normal compressibility. Common Femoral Vein: No evidence of thrombus. Normal compressibility, respiratory phasicity and response to augmentation. Saphenofemoral Junction: No evidence of thrombus. Normal compressibility and flow on color Doppler imaging. Profunda Femoral Vein: No evidence of thrombus. Normal compressibility and flow on color Doppler imaging. Femoral Vein: No evidence of thrombus. Normal compressibility, respiratory phasicity and response to augmentation. Popliteal Vein: No evidence of thrombus. Normal compressibility, respiratory phasicity and response to augmentation. Calf Veins: No evidence of thrombus. Normal compressibility and flow on color Doppler imaging. Superficial Great Saphenous Vein: No evidence of thrombus. Normal compressibility. Venous Reflux:  None. Other Findings:  None. IMPRESSION: No evidence of deep venous thrombosis. Electronically Signed   By: Earle Gell M.D.   On: 04/11/2017 20:15     Orlie Dakin, MD 04/11/17 2104

## 2017-04-11 NOTE — ED Provider Notes (Signed)
[facility] EMERGENCY DEPARTMENT Provider Note   CSN: [id number] Arrival date & time: 04/11/17  1729     History   Chief Complaint Chief Complaint  Patient presents with  . Chest Pain    HPI Linda Phelps is a 46 y.o. female with a past medical history of CHF, diabetes, hyperlipidemia, hypertension, obesity who presents to the emergency department today for chest pain.  Patient states that yesterday morning she awoke with central chest heaviness with a warm sensation that radiates underneath of her right breast.  The pain has was constant since yesterday morning, rated 5/10 at its worst. She feels this may be related to her GERD, as she has experienced in the past, since the pain radiates into her throat. She has taken ranitidine and pepcid for this with relief. She is chest pain free currently. There is no radiation into her jaw, or either shoulder.  No associated nausea, diaphoresis, or sob at rest. The pain is not associated with exertion.   The patient also notes a dry, nonproductive cough for the last 2 days as well some dyspnea on exertion when doing activities such as mopping.  She has also had left lower calf pain with some swelling x 2 days.  She says she took some Lasix for this with improvement of her symptoms. Patient denies any fever, chills, body aches, night sweats, sputum production, hemoptysis, exogenous estrogen use, recent surgery or travel, trauma, immobilization, smoking, previous blood clot,  cancer, or family history of bleeding/clotting disorder.  Patient has never had a stress test or heart catheterization.  Last echocardiogram shows ejection fraction of 55-60% with no regional wall motion.  There is grade 1 diastolic dysfunction. Patient is a never smoker and denies drug use.   HPI  Past Medical History:  Diagnosis Date  . CHF (congestive heart failure) ([place])   . Diabetes mellitus without complication ([place])   . Ectopic pregnancy   . High cholesterol   .  Hypertension   . Low back pain   . Obesity   . Thyroid disease     Patient Active Problem List   Diagnosis Date Noted  . Chronic right-sided low back pain without sciatica 07/11/2016  . Acute non intractable tension-type headache 05/18/2016  . Abnormal chest x-ray 05/18/2016  . Vitamin D insufficiency 01/13/2016  . Menstrual cycle problem 01/11/2016  . Hordeolum externum (stye) 01/11/2016  . Obesity (BMI 30.0-34.9) 10/01/2015  . Palpitations 07/26/2015  . GERD (gastroesophageal reflux disease) 07/26/2015  . Generalized anxiety disorder 07/26/2015  . Poor dentition 04/13/2015  . Cardiomegaly 09/23/2014  . Diabetes type 2, controlled ([place]) 07/03/2014  . Essential hypertension 07/03/2014  . Hyperlipidemia 07/03/2014  . Hypothyroidism 07/03/2014    Past Surgical History:  Procedure Laterality Date  . APPENDECTOMY    . CHOLECYSTECTOMY      OB History    No data available       Home Medications    Prior to Admission medications   Medication Sig Start Date End Date Taking? Authorizing Provider  acetaminophen (TYLENOL) 500 MG tablet Take 2 tablets (1,000 mg total) by mouth every 6 (six) hours as needed for mild pain or moderate pain. 01/08/17   Alfonse Spruce, FNP  amLODipine (NORVASC) 10 MG tablet Take 1 tablet (10 mg total) by mouth daily. 01/08/17   Alfonse Spruce, FNP  aspirin 81 MG tablet Take 81 mg by mouth daily.    [provider]  carvedilol (COREG) 12.5 MG tablet Take 1  tablet (12.5 mg total) by mouth 2 (two) times daily with a meal. 02/20/17   Hairston, Maylon Peppers, FNP  citalopram (CELEXA) 20 MG tablet Take 1 tablet (20 mg total) by mouth daily. 01/08/17   Alfonse Spruce, FNP  famotidine (PEPCID) 20 MG tablet Take 1 tablet (20 mg total) by mouth 2 (two) times daily. 02/18/17   Charlesetta Shanks, MD  fluticasone (FLONASE) 50 MCG/ACT nasal spray Place 2 sprays into both nostrils daily. 07/11/16   Funches, Adriana Mccallum, MD  furosemide (LASIX) 20 MG  tablet Take 1 tablet (20 mg total) by mouth 2 (two) times daily. 03/06/17   Arnoldo Morale, MD  levothyroxine (SYNTHROID, LEVOTHROID) 50 MCG tablet Take 1 tablet (50 mcg total) by mouth daily. 01/16/17   Alfonse Spruce, FNP  lisinopril (PRINIVIL,ZESTRIL) 40 MG tablet Take 1 tablet (40 mg total) by mouth daily. 01/08/17   Alfonse Spruce, FNP  metFORMIN (GLUCOPHAGE XR) 500 MG 24 hr tablet Take 500 mg in morning and 1000 mg in evening 01/08/17   Hairston, Toy Baker R, FNP  methocarbamol (ROBAXIN) 500 MG tablet Take 1 tablet (500 mg total) by mouth every 8 (eight) hours as needed for muscle spasms. 01/08/17   Alfonse Spruce, FNP  pravastatin (PRAVACHOL) 20 MG tablet Take 1 tablet (20 mg total) by mouth daily. 01/16/17   Alfonse Spruce, FNP  ranitidine (ZANTAC) 300 MG tablet TAKE 1 TABLET BY MOUTH AT BEDTIME. 01/23/17   Alfonse Spruce, FNP    Family History Family History  Problem Relation Age of Onset  . Diabetes Mother   . Diabetes Maternal Grandmother   . Colon cancer Maternal Uncle 36    Social History Social History   Tobacco Use  . Smoking status: Never Smoker  . Smokeless tobacco: Never Used  Substance Use Topics  . Alcohol use: Yes    Alcohol/week: 0.0 oz    Comment: occ  . Drug use: No     Allergies   Ibuprofen   Review of Systems Review of Systems  All other systems reviewed and are negative.    Physical Exam Updated Vital Signs BP (!) 178/97   Pulse 83   Temp 98.3 F (36.8 C) (Oral)   Resp 16   Ht 5\' 1"  (1.549 m)   Wt 86.4 kg (190 lb 8 oz)   LMP 10/08/2013   SpO2 100%   BMI 35.99 kg/m   Physical Exam  Constitutional: She appears well-developed and well-nourished.  HENT:  Head: Normocephalic and atraumatic.  Right Ear: External ear normal.  Left Ear: External ear normal.  Nose: Nose normal.  Mouth/Throat: Uvula is midline, oropharynx is clear and moist and mucous membranes are normal. No tonsillar exudate.  Eyes: Pupils are  equal, round, and reactive to light. Right eye exhibits no discharge. Left eye exhibits no discharge. No scleral icterus.  Neck: Trachea normal. Neck supple. No JVD present. No spinous process tenderness present. Carotid bruit is not present. No neck rigidity. Normal range of motion present.  Cardiovascular: Normal rate, regular rhythm and intact distal pulses.  No murmur heard. Pulses:      Radial pulses are 2+ on the right side, and 2+ on the left side.       Dorsalis pedis pulses are 2+ on the right side, and 2+ on the left side.       Posterior tibial pulses are 2+ on the right side, and 2+ on the left side.  No lower extremity swelling or edema. Left  calf TTP. Calves symmetric in size bilaterally.   Pulmonary/Chest: Effort normal and breath sounds normal. She exhibits no tenderness.  Abdominal: Soft. Bowel sounds are normal. There is no tenderness. There is no rebound and no guarding.  Musculoskeletal: She exhibits no edema.  Lymphadenopathy:    She has no cervical adenopathy.  Neurological: She is alert.  Skin: Skin is warm and dry. No rash noted. She is not diaphoretic.  No obvious osler or janeway lesions.   Psychiatric: She has a normal mood and affect.  Nursing note and vitals reviewed.    ED Treatments / Results  Labs (all labs ordered are listed, but only abnormal results are displayed) Labs Reviewed - No data to display  EKG  EKG Interpretation  Date/Time:  Wednesday April 11 2017 17:36:32 EST Ventricular Rate:  84 PR Interval:  174 QRS Duration: 90 QT Interval:  376 QTC Calculation: 444 R Axis:   24 Text Interpretation:  Normal sinus rhythm Normal ECG SINCE LAST TRACING HEART RATE HAS INCREASED Confirmed by Orlie Dakin [phone]) on 04/11/2017 5:44:07 PM       Radiology No results found.  Procedures Procedures (including critical care time)  Medications Ordered in ED Medications - No data to display   Initial Impression / Assessment and Plan / ED  Course  I have reviewed the triage vital signs and the nursing notes.  Pertinent labs & imaging results that were available during my care of the patient were reviewed by me and considered in my medical decision making (see chart for details).     46 year old female with chest pain that radiates under right breast and into throat. Says feels like past GERD presentations and treated at home with H2 blocker and PPI with relief. Patient also with Cough, lower exremity pain/swelling and DOE since onset. Patients vital signs are reassuring. Exam with RRR, lungs CTA b/l, no JVD, no lower extremity edema. There was calve TTP on the left. Will order CBC, BMP, BNP Tn, ECG, CXR, LE [place] and D-Dimer to eval.   ECG without ischemic changes. Patient Tn <0.03. BNP wnl. BMP with K of 3.3 and otherwise reassuring. Will replace K orally. CBC reassuring and without leukocytosis or anemia. Plt wnl. D-Dimer noted to be elevated. Due to patient chest pain with cough, doe and preceding lower leg pain and swelling will get CT PE study to eval.   [place] LE negative. CTA negative.   Patient heart score 3. She is without CP here in the department. Feel patient symptoms may be related to her GERD. Will have patient take Maalox after meals and at bedtime. She is to follow up with PCP. Strict return precautions discussed. She is in agreement with plan, hemodynamically stable and appears safe for discharge. Patient case seen and discussed with Dr. Winfred Leeds who is in agreement with the plan.   Final Clinical Impressions(s) / ED Diagnoses   Final diagnoses:  Nonspecific chest pain    ED Discharge Orders    None       Lorelle Gibbs 04/12/17 0241    Orlie Dakin, MD 04/13/17 1452

## 2017-04-11 NOTE — Discharge Instructions (Signed)
You can take Maalox 2 tablespoons after meals and at bedtime in addition to your usual medications.  Sleep with your head elevated on 2 or 3 pillows.  Contact your primary care physician in 2 days to arrange for follow-up within the next few weeks.  Return if concern for any reason

## 2017-04-11 NOTE — ED Notes (Signed)
Pt verbalizes understanding of d/c instructions and denies any further needs at this time. 

## 2017-04-11 NOTE — ED Notes (Signed)
ED Provider at bedside. 

## 2017-04-19 ENCOUNTER — Other Ambulatory Visit: Payer: Self-pay | Admitting: Family Medicine

## 2017-04-19 DIAGNOSIS — F411 Generalized anxiety disorder: Secondary | ICD-10-CM

## 2017-04-23 ENCOUNTER — Other Ambulatory Visit: Payer: Self-pay | Admitting: Family Medicine

## 2017-04-23 DIAGNOSIS — I10 Essential (primary) hypertension: Secondary | ICD-10-CM

## 2017-04-25 NOTE — Progress Notes (Deleted)
Patient ID: Linda Phelps, female   DOB: [birth year], 46 y.o.   MRN: [id number] After ED visit 04/11/2017 for CP and dry cough.  EKG w/o ischemic changes, D-dimer was  Elevated; CT angiogram was negative for PE and LE U/S negative.  Potassium repleted in ED. She was treated for GERD.

## 2017-04-26 ENCOUNTER — Ambulatory Visit: Payer: Self-pay

## 2017-05-07 NOTE — Progress Notes (Deleted)
Patient ID: Linda Phelps, female   DOB: 07-21-1970, 46 y.o.   MRN: [id number] After being seen in the ED 04/11/2017 for nos-specific chest pain.  EKG did not show acute ischemia.  From ED note:  ECG without ischemic changes. Patient Tn <0.03. BNP wnl. BMP with K of 3.3 and otherwise reassuring. Will replace K orally. CBC reassuring and without leukocytosis or anemia. Plt wnl. D-Dimer noted to be elevated. Due to patient chest pain with cough, doe and preceding lower leg pain and swelling will get CT PE study to eval.   [place] LE negative. CTA negative.   Patient heart score 3. She is without CP here in the department. Feel patient symptoms may be related to her GERD. Will have patient take Maalox after meals and at bedtime. She is to follow up with PCP. Strict return precautions discussed. She is in agreement with plan, hemodynamically stable and appears safe for discharge. Patient case seen and discussed with Dr. Winfred Leeds who is in agreement with the plan.

## 2017-05-09 ENCOUNTER — Ambulatory Visit: Payer: Self-pay | Attending: Internal Medicine | Admitting: Physician Assistant

## 2017-05-09 ENCOUNTER — Ambulatory Visit: Payer: Self-pay

## 2017-05-09 VITALS — BP 136/76 | HR 89 | Temp 98.3°F | Resp 18 | Ht 61.0 in | Wt 197.6 lb

## 2017-05-09 DIAGNOSIS — Z79890 Hormone replacement therapy (postmenopausal): Secondary | ICD-10-CM | POA: Insufficient documentation

## 2017-05-09 DIAGNOSIS — F411 Generalized anxiety disorder: Secondary | ICD-10-CM

## 2017-05-09 DIAGNOSIS — X58XXXA Exposure to other specified factors, initial encounter: Secondary | ICD-10-CM | POA: Insufficient documentation

## 2017-05-09 DIAGNOSIS — Z91128 Patient's intentional underdosing of medication regimen for other reason: Secondary | ICD-10-CM | POA: Insufficient documentation

## 2017-05-09 DIAGNOSIS — S161XXS Strain of muscle, fascia and tendon at neck level, sequela: Secondary | ICD-10-CM

## 2017-05-09 DIAGNOSIS — Z79899 Other long term (current) drug therapy: Secondary | ICD-10-CM | POA: Insufficient documentation

## 2017-05-09 DIAGNOSIS — K219 Gastro-esophageal reflux disease without esophagitis: Secondary | ICD-10-CM

## 2017-05-09 DIAGNOSIS — I110 Hypertensive heart disease with heart failure: Secondary | ICD-10-CM | POA: Insufficient documentation

## 2017-05-09 DIAGNOSIS — Z7984 Long term (current) use of oral hypoglycemic drugs: Secondary | ICD-10-CM | POA: Insufficient documentation

## 2017-05-09 DIAGNOSIS — S161XXA Strain of muscle, fascia and tendon at neck level, initial encounter: Secondary | ICD-10-CM | POA: Insufficient documentation

## 2017-05-09 DIAGNOSIS — E039 Hypothyroidism, unspecified: Secondary | ICD-10-CM

## 2017-05-09 DIAGNOSIS — E669 Obesity, unspecified: Secondary | ICD-10-CM | POA: Insufficient documentation

## 2017-05-09 DIAGNOSIS — I10 Essential (primary) hypertension: Secondary | ICD-10-CM

## 2017-05-09 DIAGNOSIS — Z7982 Long term (current) use of aspirin: Secondary | ICD-10-CM | POA: Insufficient documentation

## 2017-05-09 DIAGNOSIS — E782 Mixed hyperlipidemia: Secondary | ICD-10-CM

## 2017-05-09 DIAGNOSIS — I509 Heart failure, unspecified: Secondary | ICD-10-CM | POA: Insufficient documentation

## 2017-05-09 DIAGNOSIS — T383X6A Underdosing of insulin and oral hypoglycemic [antidiabetic] drugs, initial encounter: Secondary | ICD-10-CM | POA: Insufficient documentation

## 2017-05-09 DIAGNOSIS — E1121 Type 2 diabetes mellitus with diabetic nephropathy: Secondary | ICD-10-CM

## 2017-05-09 LAB — GLUCOSE, POCT (MANUAL RESULT ENTRY): POC GLUCOSE: 175 mg/dL — AB (ref 70–99)

## 2017-05-09 LAB — POCT GLYCOSYLATED HEMOGLOBIN (HGB A1C): HEMOGLOBIN A1C: 6.3

## 2017-05-09 MED ORDER — FAMOTIDINE 20 MG PO TABS: 20.0000 mg | ORAL_TABLET | Freq: Two times a day (BID) | ORAL | 1 refills | Status: DC

## 2017-05-09 MED ORDER — LEVOTHYROXINE SODIUM 50 MCG PO TABS: 50.0000 ug | ORAL_TABLET | Freq: Every day | ORAL | 5 refills | Status: DC

## 2017-05-09 MED ORDER — METHOCARBAMOL 500 MG PO TABS: 500.0000 mg | ORAL_TABLET | Freq: Three times a day (TID) | ORAL | 2 refills | Status: DC | PRN

## 2017-05-09 MED ORDER — LISINOPRIL 40 MG PO TABS: 40.0000 mg | ORAL_TABLET | Freq: Every day | ORAL | 3 refills | Status: DC

## 2017-05-09 MED ORDER — PRAVASTATIN SODIUM 20 MG PO TABS: 20.0000 mg | ORAL_TABLET | Freq: Every day | ORAL | 3 refills | Status: DC

## 2017-05-09 MED ORDER — FAMOTIDINE 20 MG PO TABS: 20.0000 mg | ORAL_TABLET | Freq: Two times a day (BID) | ORAL | 2 refills | Status: DC

## 2017-05-09 MED ORDER — AMLODIPINE BESYLATE 10 MG PO TABS: 10.0000 mg | ORAL_TABLET | Freq: Every day | ORAL | 3 refills | Status: DC

## 2017-05-09 MED ORDER — FUROSEMIDE 20 MG PO TABS: 20.0000 mg | ORAL_TABLET | Freq: Two times a day (BID) | ORAL | 3 refills | Status: DC

## 2017-05-09 MED ORDER — CARVEDILOL 12.5 MG PO TABS: 12.5000 mg | ORAL_TABLET | Freq: Two times a day (BID) | ORAL | 2 refills | Status: DC

## 2017-05-09 MED ORDER — RANITIDINE HCL 300 MG PO TABS: 300.0000 mg | ORAL_TABLET | Freq: Every day | ORAL | 5 refills | Status: DC

## 2017-05-09 MED ORDER — METFORMIN HCL ER 500 MG PO TB24: ORAL_TABLET | ORAL | 3 refills | Status: DC

## 2017-05-09 MED ORDER — CITALOPRAM HYDROBROMIDE 20 MG PO TABS: 20.0000 mg | ORAL_TABLET | Freq: Every day | ORAL | 1 refills | Status: DC

## 2017-05-09 NOTE — Progress Notes (Signed)
Patient stated she need medication refills.

## 2017-05-09 NOTE — Progress Notes (Signed)
Linda Phelps, is a 46 y.o. female  IRC:[id number]  PZW:[id number]  DOB - [birth year]  Subjective:  Chief Complaint and HPI: Linda Phelps is a 46 y.o. female here today for diabetes check, med RF, and a 2-3 day h/o cough.  No f/c.  Cough is non-productive.    Blood sugars running around 120 in the evenings.  She is taking metformin 500mg  twice daily but is supposed to be taking 1000mg  in the evenings and 500mg  in the morning.    Compliant on other meds.  Had recent labs.    TSH in August was therapeutic on current dose.    Anxiety is controlled on Celexa.     ROS:   Constitutional:  No f/c, No night sweats, No unexplained weight loss. EENT:  No vision changes, No blurry vision, No hearing changes. No mouth, throat, or ear problems.  Respiratory: + cough, No SOB Cardiac: No CP, no palpitations GI:  No abd pain, No N/V/D. + reflux GU: No Urinary s/sx Musculoskeletal: back pain on and off Neuro: No headache, no dizziness, no motor weakness.  Skin: No rash Endocrine:  No polydipsia. No polyuria.  Psych: Denies SI/HI  No problems updated.  ALLERGIES: Allergies  Allergen Reactions  . Ibuprofen Swelling    PAST MEDICAL HISTORY: Past Medical History:  Diagnosis Date  . CHF (congestive heart failure) ([place])   . Diabetes mellitus without complication ([place])   . Ectopic pregnancy   . High cholesterol   . Hypertension   . Low back pain   . Obesity   . Thyroid disease     MEDICATIONS AT HOME: Prior to Admission medications   Medication Sig Start Date End Date Taking? Authorizing Provider  acetaminophen (TYLENOL) 500 MG tablet Take 2 tablets (1,000 mg total) by mouth every 6 (six) hours as needed for mild pain or moderate pain. 01/08/17  Yes Hairston, Maylon Peppers, FNP  amLODipine (NORVASC) 10 MG tablet Take 1 tablet (10 mg total) by mouth daily. 05/09/17  Yes Argentina Donovan, PA-C  aspirin 81 MG tablet Take 81 mg by mouth daily.   Yes [provider]  carvedilol  (COREG) 12.5 MG tablet Take 1 tablet (12.5 mg total) by mouth 2 (two) times daily with a meal. 05/09/17  Yes McClung, Angela M, PA-C  citalopram (CELEXA) 20 MG tablet Take 1 tablet (20 mg total) by mouth daily. 05/09/17  Yes Freeman Caldron M, PA-C  famotidine (PEPCID) 20 MG tablet Take 1 tablet (20 mg total) by mouth 2 (two) times daily. 05/09/17  Yes Freeman Caldron M, PA-C  furosemide (LASIX) 20 MG tablet Take 1 tablet (20 mg total) by mouth 2 (two) times daily. 05/09/17  Yes Freeman Caldron M, PA-C  levothyroxine (SYNTHROID, LEVOTHROID) 50 MCG tablet Take 1 tablet (50 mcg total) by mouth daily. 05/09/17  Yes Freeman Caldron M, PA-C  lisinopril (PRINIVIL,ZESTRIL) 40 MG tablet Take 1 tablet (40 mg total) by mouth daily. 05/09/17  Yes Freeman Caldron M, PA-C  metFORMIN (GLUCOPHAGE XR) 500 MG 24 hr tablet Take 500 mg in morning and 1000 mg in evening 05/09/17  Yes McClung, Angela M, PA-C  methocarbamol (ROBAXIN) 500 MG tablet Take 1 tablet (500 mg total) by mouth every 8 (eight) hours as needed for muscle spasms. 05/09/17  Yes Freeman Caldron M, PA-C  pravastatin (PRAVACHOL) 20 MG tablet Take 1 tablet (20 mg total) by mouth daily. 05/09/17  Yes Freeman Caldron M, PA-C  ranitidine (ZANTAC) 300 MG tablet Take 1 tablet (300 mg  total) by mouth at bedtime. 05/09/17  Yes McClung, Angela M, PA-C  fluticasone (FLONASE) 50 MCG/ACT nasal spray Place 2 sprays into both nostrils daily. Patient not taking: Reported on 05/09/2017 07/11/16   Boykin Nearing, MD     Objective:  EXAM:   Vitals:   05/09/17 1528  BP: 136/76  Pulse: 89  Resp: 18  Temp: 98.3 F (36.8 C)  TempSrc: Oral  SpO2: 96%  Weight: 197 lb 9.6 oz (89.6 kg)  Height: 5\' 1"  (1.549 m)    General appearance : A&OX3. NAD. Non-toxic-appearing HEENT: Atraumatic and Normocephalic.  PERRLA. EOM intact.  TM clear B. Mouth-MMM, post pharynx WNL w/o erythema, No PND. Neck: supple, no JVD. No cervical lymphadenopathy. No  thyromegaly Chest/Lungs:  Breathing-non-labored, Good air entry bilaterally, breath sounds normal without rales, rhonchi, or wheezing  CVS: S1 S2 regular, no murmurs, gallops, rubs  Extremities: Bilateral Lower Ext shows no edema, both legs are warm to touch with = pulse throughout Neurology:  CN II-XII grossly intact, Non focal.   Psych:  TP linear. J/I WNL. Normal speech. Appropriate eye contact and affect.  Skin:  No Rash  Data Review Lab Results  Component Value Date   HGBA1C 6.3 05/09/2017   HGBA1C 6.0 01/08/2017   HGBA1C 6.2 07/11/2016     Assessment & Plan   1. Controlled type 2 diabetes mellitus with diabetic nephropathy, without long-term current use of insulin ([place]) Not controlled but not taking meds as she is supposed to.   - HgB A1c - Glucose (CBG) - metFORMIN (GLUCOPHAGE XR) 500 MG 24 hr tablet; Take 500 mg in morning and 1000 mg in evening  Dispense: 180 tablet; Refill: 3  2. Essential hypertension Adequate control.  Continue current regimen - lisinopril (PRINIVIL,ZESTRIL) 40 MG tablet; Take 1 tablet (40 mg total) by mouth daily.  Dispense: 90 tablet; Refill: 3 - furosemide (LASIX) 20 MG tablet; Take 1 tablet (20 mg total) by mouth 2 (two) times daily.  Dispense: 60 tablet; Refill: 3 - amLODipine (NORVASC) 10 MG tablet; Take 1 tablet (10 mg total) by mouth daily.  Dispense: 90 tablet; Refill: 3 - carvedilol (COREG) 12.5 MG tablet; Take 1 tablet (12.5 mg total) by mouth 2 (two) times daily with a meal.  Dispense: 60 tablet; Refill: 2  3. Gastroesophageal reflux disease, esophagitis presence not specified continue- ranitidine (ZANTAC) 300 MG tablet; Take 1 tablet (300 mg total) by mouth at bedtime.  Dispense: 30 tablet; Refill: 5 - famotidine (PEPCID) 20 MG tablet; Take 1 tablet (20 mg total) by mouth 2 (two) times daily.  Dispense: 60 tablet; Refill: 2  4. Mixed hyperlipidemia continue - pravastatin (PRAVACHOL) 20 MG tablet; Take 1 tablet (20 mg total) by mouth  daily.  Dispense: 30 tablet; Refill: 3  5. Strain of neck muscle, sequela continue - methocarbamol (ROBAXIN) 500 MG tablet; Take 1 tablet (500 mg total) by mouth every 8 (eight) hours as needed for muscle spasms.  Dispense: 90 tablet; Refill: 2  6. Hypothyroidism, unspecified type continue - levothyroxine (SYNTHROID, LEVOTHROID) 50 MCG tablet; Take 1 tablet (50 mcg total) by mouth daily.  Dispense: 30 tablet; Refill: 5  7. Generalized anxiety disorder continue - citalopram (CELEXA) 20 MG tablet; Take 1 tablet (20 mg total) by mouth daily.  Dispense: 30 tablet; Refill: 1  Eat potassium rich foods. Recheck por=tassium at next visit.     Patient have been counseled extensively about nutrition and exercise  Return in about 3 months (around 08/07/2017) for Geryl Rankins;  DM, htn.  The patient was given clear instructions to go to ER or return to medical center if symptoms don't improve, worsen or new problems develop. The patient verbalized understanding. The patient was told to call to get lab results if they haven't heard anything in the next week.     Freeman Caldron, PA-C [facility] and [place], [place]   05/09/2017, 5:09 PMPatient ID: Linda Phelps, female   DOB: [birth year], 46 y.o.   MRN: [id number]

## 2017-05-18 ENCOUNTER — Other Ambulatory Visit: Payer: Self-pay | Admitting: Family Medicine

## 2017-05-18 DIAGNOSIS — I10 Essential (primary) hypertension: Secondary | ICD-10-CM

## 2017-05-31 ENCOUNTER — Ambulatory Visit: Payer: Self-pay | Admitting: Internal Medicine

## 2017-06-03 ENCOUNTER — Emergency Department (HOSPITAL_BASED_OUTPATIENT_CLINIC_OR_DEPARTMENT_OTHER)
Admission: EM | Admit: 2017-06-03 | Discharge: 2017-06-03 | Disposition: A | Discharge status: Home / Self Care | Payer: Self-pay | Attending: Emergency Medicine | Admitting: Emergency Medicine

## 2017-06-03 ENCOUNTER — Encounter (HOSPITAL_BASED_OUTPATIENT_CLINIC_OR_DEPARTMENT_OTHER): Payer: Self-pay | Admitting: Emergency Medicine

## 2017-06-03 ENCOUNTER — Other Ambulatory Visit: Payer: Self-pay

## 2017-06-03 ENCOUNTER — Emergency Department (HOSPITAL_BASED_OUTPATIENT_CLINIC_OR_DEPARTMENT_OTHER): Payer: Self-pay

## 2017-06-03 DIAGNOSIS — E119 Type 2 diabetes mellitus without complications: Secondary | ICD-10-CM | POA: Insufficient documentation

## 2017-06-03 DIAGNOSIS — R109 Unspecified abdominal pain: Secondary | ICD-10-CM | POA: Insufficient documentation

## 2017-06-03 DIAGNOSIS — Z7902 Long term (current) use of antithrombotics/antiplatelets: Secondary | ICD-10-CM | POA: Insufficient documentation

## 2017-06-03 DIAGNOSIS — R1013 Epigastric pain: Secondary | ICD-10-CM | POA: Insufficient documentation

## 2017-06-03 DIAGNOSIS — R110 Nausea: Secondary | ICD-10-CM | POA: Insufficient documentation

## 2017-06-03 DIAGNOSIS — I110 Hypertensive heart disease with heart failure: Secondary | ICD-10-CM | POA: Insufficient documentation

## 2017-06-03 DIAGNOSIS — Z7982 Long term (current) use of aspirin: Secondary | ICD-10-CM | POA: Insufficient documentation

## 2017-06-03 DIAGNOSIS — I509 Heart failure, unspecified: Secondary | ICD-10-CM | POA: Insufficient documentation

## 2017-06-03 DIAGNOSIS — M545 Low back pain, unspecified: Secondary | ICD-10-CM

## 2017-06-03 DIAGNOSIS — Z7984 Long term (current) use of oral hypoglycemic drugs: Secondary | ICD-10-CM | POA: Insufficient documentation

## 2017-06-03 DIAGNOSIS — Z79899 Other long term (current) drug therapy: Secondary | ICD-10-CM | POA: Insufficient documentation

## 2017-06-03 DIAGNOSIS — E039 Hypothyroidism, unspecified: Secondary | ICD-10-CM | POA: Insufficient documentation

## 2017-06-03 DIAGNOSIS — I10 Essential (primary) hypertension: Secondary | ICD-10-CM | POA: Insufficient documentation

## 2017-06-03 LAB — COMPREHENSIVE METABOLIC PANEL
ALT: 37 U/L (ref 14–54)
ANION GAP: 9 (ref 5–15)
AST: 45 U/L — ABNORMAL HIGH (ref 15–41)
Albumin: 3.6 g/dL (ref 3.5–5.0)
Alkaline Phosphatase: 54 U/L (ref 38–126)
BUN: 13 mg/dL (ref 6–20)
CHLORIDE: 103 mmol/L (ref 101–111)
CO2: 27 mmol/L (ref 22–32)
CREATININE: 0.84 mg/dL (ref 0.44–1.00)
Calcium: 9 mg/dL (ref 8.9–10.3)
Glucose, Bld: 121 mg/dL — ABNORMAL HIGH (ref 65–99)
POTASSIUM: 3.5 mmol/L (ref 3.5–5.1)
SODIUM: 139 mmol/L (ref 135–145)
Total Bilirubin: 0.7 mg/dL (ref 0.3–1.2)
Total Protein: 7.5 g/dL (ref 6.5–8.1)

## 2017-06-03 LAB — URINALYSIS, ROUTINE W REFLEX MICROSCOPIC
Bilirubin Urine: NEGATIVE
Glucose, UA: NEGATIVE mg/dL
KETONES UR: NEGATIVE mg/dL
LEUKOCYTES UA: NEGATIVE
NITRITE: NEGATIVE
PH: 6 (ref 5.0–8.0)
Protein, ur: 100 mg/dL — AB
SPECIFIC GRAVITY, URINE: 1.01 (ref 1.005–1.030)

## 2017-06-03 LAB — CBC WITH DIFFERENTIAL/PLATELET
Basophils Absolute: 0 10*3/uL (ref 0.0–0.1)
Basophils Relative: 0 %
EOS ABS: 0.2 10*3/uL (ref 0.0–0.7)
Eosinophils Relative: 3 %
HCT: 39.7 % (ref 36.0–46.0)
Hemoglobin: 13.3 g/dL (ref 12.0–15.0)
LYMPHS ABS: 2.4 10*3/uL (ref 0.7–4.0)
LYMPHS PCT: 44 %
MCH: 30 pg (ref 26.0–34.0)
MCHC: 33.5 g/dL (ref 30.0–36.0)
MCV: 89.4 fL (ref 78.0–100.0)
MONO ABS: 0.5 10*3/uL (ref 0.1–1.0)
Monocytes Relative: 10 %
Neutro Abs: 2.3 10*3/uL (ref 1.7–7.7)
Neutrophils Relative %: 43 %
PLATELETS: 281 10*3/uL (ref 150–400)
RBC: 4.44 MIL/uL (ref 3.87–5.11)
RDW: 12.7 % (ref 11.5–15.5)
WBC: 5.3 10*3/uL (ref 4.0–10.5)

## 2017-06-03 LAB — URINALYSIS, MICROSCOPIC (REFLEX): WBC UA: NONE SEEN WBC/hpf (ref 0–5)

## 2017-06-03 LAB — LIPASE, BLOOD: LIPASE: 39 U/L (ref 11–51)

## 2017-06-03 LAB — TROPONIN I: Troponin I: <0.03 ng/mL (ref <0.03)

## 2017-06-03 LAB — PREGNANCY, URINE: PREG TEST UR: NEGATIVE

## 2017-06-03 MED ORDER — HYDRALAZINE HCL 20 MG/ML IJ SOLN
10.0000 mg | Freq: Once | INTRAMUSCULAR | Status: AC
  Administered 2017-06-03: 10 mg via INTRAVENOUS
  Filled 2017-06-03: qty 1

## 2017-06-03 MED ORDER — PANTOPRAZOLE SODIUM 40 MG IV SOLR
40.0000 mg | Freq: Once | INTRAVENOUS | Status: DC
  Filled 2017-06-03: qty 40

## 2017-06-03 MED ORDER — TRAMADOL HCL 50 MG PO TABS: 50.0000 mg | ORAL_TABLET | Freq: Four times a day (QID) | ORAL | 0 refills | Status: DC | PRN

## 2017-06-03 MED ORDER — AMLODIPINE BESYLATE 5 MG PO TABS: 10.0000 mg | ORAL_TABLET | Freq: Once | ORAL | Status: DC

## 2017-06-03 MED ORDER — FAMOTIDINE IN NACL 20-0.9 MG/50ML-% IV SOLN
20.0000 mg | Freq: Once | INTRAVENOUS | Status: AC
  Administered 2017-06-03: 20 mg via INTRAVENOUS
  Filled 2017-06-03: qty 50

## 2017-06-03 MED ORDER — ONDANSETRON HCL 4 MG/2ML IJ SOLN: 4.0000 mg | Freq: Once | INTRAMUSCULAR | Status: DC

## 2017-06-03 MED ORDER — SODIUM CHLORIDE 0.9 % IV BOLUS (SEPSIS): 1000.0000 mL | Freq: Once | INTRAVENOUS | Status: DC

## 2017-06-03 MED ORDER — SUCRALFATE 1 GM/10ML PO SUSP: 1.0000 g | Freq: Three times a day (TID) | ORAL | 0 refills | Status: DC | PRN

## 2017-06-03 MED ORDER — LISINOPRIL 10 MG PO TABS: 40.0000 mg | ORAL_TABLET | Freq: Once | ORAL | Status: DC

## 2017-06-03 NOTE — ED Notes (Signed)
ED Provider at bedside. 

## 2017-06-03 NOTE — ED Provider Notes (Signed)
[facility] EMERGENCY DEPARTMENT Provider Note   CSN: [id number] Arrival date & time: 06/03/17  1015     History   Chief Complaint Chief Complaint  Patient presents with  . Abdominal Pain    HPI Skiler Tye is a 47 y.o. female history of CHF, diabetes, high cholesterol here presenting with epigastric pain, right flank pain.  Patient states that she has epigastric pain for the last 2-3 days.  Patient states that it was a nauseating feeling but is not worse with eating.  Patient had cholecystectomy already.  Today, patient noticed that the pain radiates to her right flank suddenly and is more sharp than usual.  Denies any blood in her urine or pain with urination.  Denies any fevers or chills or sick contacts.  The history is provided by the patient.    Past Medical History:  Diagnosis Date  . CHF (congestive heart failure) ([place])   . Diabetes mellitus without complication ([place])   . Ectopic pregnancy   . High cholesterol   . Hypertension   . Low back pain   . Obesity   . Thyroid disease     Patient Active Problem List   Diagnosis Date Noted  . Chronic right-sided low back pain without sciatica 07/11/2016  . Acute non intractable tension-type headache 05/18/2016  . Abnormal chest x-ray 05/18/2016  . Vitamin D insufficiency 01/13/2016  . Menstrual cycle problem 01/11/2016  . Hordeolum externum (stye) 01/11/2016  . Obesity (BMI 30.0-34.9) 10/01/2015  . Palpitations 07/26/2015  . GERD (gastroesophageal reflux disease) 07/26/2015  . Generalized anxiety disorder 07/26/2015  . Poor dentition 04/13/2015  . Cardiomegaly 09/23/2014  . Diabetes type 2, controlled ([place]) 07/03/2014  . Essential hypertension 07/03/2014  . Hyperlipidemia 07/03/2014  . Hypothyroidism 07/03/2014    Past Surgical History:  Procedure Laterality Date  . APPENDECTOMY    . CHOLECYSTECTOMY      OB History    No data available       Home Medications    Prior to Admission medications    Medication Sig Start Date End Date Taking? Authorizing Provider  acetaminophen (TYLENOL) 500 MG tablet Take 2 tablets (1,000 mg total) by mouth every 6 (six) hours as needed for mild pain or moderate pain. 01/08/17   Alfonse Spruce, FNP  amLODipine (NORVASC) 10 MG tablet Take 1 tablet (10 mg total) by mouth daily. 05/09/17   Argentina Donovan, PA-C  aspirin 81 MG tablet Take 81 mg by mouth daily.    [provider]  carvedilol (COREG) 12.5 MG tablet Take 1 tablet (12.5 mg total) by mouth 2 (two) times daily with a meal. 05/09/17   McClung, Dionne Bucy, PA-C  citalopram (CELEXA) 20 MG tablet Take 1 tablet (20 mg total) by mouth daily. 05/09/17   Argentina Donovan, PA-C  famotidine (PEPCID) 20 MG tablet Take 1 tablet (20 mg total) by mouth 2 (two) times daily. 05/09/17   Argentina Donovan, PA-C  fluticasone (FLONASE) 50 MCG/ACT nasal spray Place 2 sprays into both nostrils daily. Patient not taking: Reported on 05/09/2017 07/11/16   Boykin Nearing, MD  furosemide (LASIX) 20 MG tablet Take 1 tablet (20 mg total) by mouth 2 (two) times daily. 05/09/17   Argentina Donovan, PA-C  levothyroxine (SYNTHROID, LEVOTHROID) 50 MCG tablet Take 1 tablet (50 mcg total) by mouth daily. 05/09/17   Argentina Donovan, PA-C  lisinopril (PRINIVIL,ZESTRIL) 40 MG tablet Take 1 tablet (40 mg total) by mouth daily. 05/09/17  Freeman Caldron M, PA-C  metFORMIN (GLUCOPHAGE XR) 500 MG 24 hr tablet Take 500 mg in morning and 1000 mg in evening 05/09/17   Freeman Caldron M, PA-C  methocarbamol (ROBAXIN) 500 MG tablet Take 1 tablet (500 mg total) by mouth every 8 (eight) hours as needed for muscle spasms. 05/09/17   Argentina Donovan, PA-C  pravastatin (PRAVACHOL) 20 MG tablet Take 1 tablet (20 mg total) by mouth daily. 05/09/17   Argentina Donovan, PA-C  ranitidine (ZANTAC) 300 MG tablet Take 1 tablet (300 mg total) by mouth at bedtime. 05/09/17   Argentina Donovan, PA-C    Family History Family History    Problem Relation Age of Onset  . Diabetes Mother   . Diabetes Maternal Grandmother   . Colon cancer Maternal Uncle 58    Social History Social History   Tobacco Use  . Smoking status: Never Smoker  . Smokeless tobacco: Never Used  Substance Use Topics  . Alcohol use: Yes    Alcohol/week: 0.0 oz    Comment: occ  . Drug use: No     Allergies   Ibuprofen   Review of Systems Review of Systems  Gastrointestinal: Positive for abdominal pain.  All other systems reviewed and are negative.    Physical Exam Updated Vital Signs BP (!) 162/102 (BP Location: Left Wrist)   Pulse 90   Temp 97.9 F (36.6 C) (Oral)   Resp 15   Ht 5\' 1"  (1.549 m)   Wt 90.7 kg (200 lb)   LMP 10/08/2013   SpO2 100%   BMI 37.79 kg/m   Physical Exam  Constitutional: She appears well-developed and well-nourished.  HENT:  Head: Normocephalic.  Mouth/Throat: Oropharynx is clear and moist.  Eyes: Pupils are equal, round, and reactive to light.  Cardiovascular: Normal rate and regular rhythm.  Pulmonary/Chest: Effort normal and breath sounds normal.  Abdominal: Soft. Normal appearance.  Minimal epigastric tenderness, no RUQ tenderness. Mild R CVAT   Neurological: She is alert.  Skin: Skin is warm.  Psychiatric: She has a normal mood and affect.  Nursing note and vitals reviewed.    ED Treatments / Results  Labs (all labs ordered are listed, but only abnormal results are displayed) Labs Reviewed  COMPREHENSIVE METABOLIC PANEL - Abnormal; Notable for the following components:      Result Value   Glucose, Bld 121 (*)    AST 45 (*)    All other components within normal limits  URINALYSIS, ROUTINE W REFLEX MICROSCOPIC - Abnormal; Notable for the following components:   Color, Urine STRAW (*)    Hgb urine dipstick TRACE (*)    Protein, ur 100 (*)    All other components within normal limits  URINALYSIS, MICROSCOPIC (REFLEX) - Abnormal; Notable for the following components:   Bacteria, UA  RARE (*)    Squamous Epithelial / LPF 0-5 (*)    All other components within normal limits  CBC WITH DIFFERENTIAL/PLATELET  LIPASE, BLOOD  TROPONIN I  PREGNANCY, URINE    EKG  EKG Interpretation  Date/Time:  Sunday June 03 2017 11:29:09 EST Ventricular Rate:  71 PR Interval:    QRS Duration: 95 QT Interval:  391 QTC Calculation: 425 R Axis:   65 Text Interpretation:  Sinus rhythm Consider left atrial enlargement Minimal ST elevation, lateral leads Baseline wander in lead(s) II III aVF V1 No significant change since last tracing Confirmed by Wandra Arthurs [phone]) on 06/03/2017 11:34:00 AM  Radiology Dg Abd Acute W/chest  Result Date: 06/03/2017 CLINICAL DATA:  Abdominal pain, vomiting EXAM: DG ABDOMEN ACUTE W/ 1V CHEST COMPARISON:  CT 04/11/2017 FINDINGS: Prior cholecystectomy. The bowel gas pattern is normal. There is no evidence of free intraperitoneal air. No suspicious radio-opaque calculi or other significant radiographic abnormality is seen. Heart size and mediastinal contours are within normal limits. Both lungs are clear. IMPRESSION: Negative abdominal radiographs.  No acute cardiopulmonary disease. Electronically Signed   By: Rolm Baptise M.D.   On: 06/03/2017 12:11    Procedures Procedures (including critical care time)  Medications Ordered in ED Medications  famotidine (PEPCID) IVPB 20 mg premix (0 mg Intravenous Stopped 06/03/17 1320)  hydrALAZINE (APRESOLINE) injection 10 mg (10 mg Intravenous Given 06/03/17 1239)     Initial Impression / Assessment and Plan / ED Course  I have reviewed the triage vital signs and the nursing notes.  Pertinent labs & imaging results that were available during my care of the patient were reviewed by me and considered in my medical decision making (see chart for details).     Undra Trembath is a 47 y.o. female here with abdominal pain, R flank pain. Consider gastritis vs renal colic vs pyelo. Afebrile, well appearing. Had  previous cholecystectomy. Will get labs, lipase, UA. Will get acute abdominal series   1:31 PM Labs and UA and xrays unremarkable. BP improved to 160s from 190s. Lipase and LFTs nl. I think she may have gastritis. She doesn't want PPIs and is already on pepcid. Will put her on carafate and refer her to GI for endoscopy. She had previous back injury and I think this exacerbated her chronic back pain. There is no new injury and she has no weakness or numbness so will not need spinal imaging. Tried muscle relaxants already, will give tramadol for pain.   Final Clinical Impressions(s) / ED Diagnoses   Final diagnoses:  None    ED Discharge Orders    None       Drenda Freeze, MD 06/03/17 [phone]

## 2017-06-03 NOTE — ED Notes (Signed)
Pt on cardiac monitor and auto VS 

## 2017-06-03 NOTE — Discharge Instructions (Signed)
Continue pepcid. Take carafate as needed. You should see GI doctor for endoscopy if you have persistent pain.   Take tylenol for pain. Take tramadol for severe pain.   Continue taking your blood pressure medicines   See your doctor next week   Return to ER if you have severe abdominal pain, vomiting, chest pain, back pain, numbness, weakness

## 2017-06-03 NOTE — ED Triage Notes (Signed)
Patient states that she is having pain to her epigastric region around her under breast region bilaterally that radiates down to her back. This started 2 days ago. Denies any N/V

## 2017-06-07 ENCOUNTER — Ambulatory Visit: Payer: Self-pay | Admitting: Internal Medicine

## 2017-06-22 ENCOUNTER — Emergency Department (HOSPITAL_BASED_OUTPATIENT_CLINIC_OR_DEPARTMENT_OTHER)
Admission: EM | Admit: 2017-06-22 | Discharge: 2017-06-22 | Discharge status: Against Medical Advice | Payer: Self-pay | Attending: Emergency Medicine | Admitting: Emergency Medicine

## 2017-06-22 ENCOUNTER — Encounter (HOSPITAL_BASED_OUTPATIENT_CLINIC_OR_DEPARTMENT_OTHER): Payer: Self-pay

## 2017-06-22 ENCOUNTER — Other Ambulatory Visit: Payer: Self-pay

## 2017-06-22 DIAGNOSIS — E119 Type 2 diabetes mellitus without complications: Secondary | ICD-10-CM | POA: Insufficient documentation

## 2017-06-22 DIAGNOSIS — Z7984 Long term (current) use of oral hypoglycemic drugs: Secondary | ICD-10-CM | POA: Insufficient documentation

## 2017-06-22 DIAGNOSIS — Z79899 Other long term (current) drug therapy: Secondary | ICD-10-CM | POA: Insufficient documentation

## 2017-06-22 DIAGNOSIS — R197 Diarrhea, unspecified: Secondary | ICD-10-CM | POA: Insufficient documentation

## 2017-06-22 DIAGNOSIS — E039 Hypothyroidism, unspecified: Secondary | ICD-10-CM | POA: Insufficient documentation

## 2017-06-22 DIAGNOSIS — I119 Hypertensive heart disease without heart failure: Secondary | ICD-10-CM | POA: Insufficient documentation

## 2017-06-22 NOTE — ED Triage Notes (Signed)
C/o diarrhea x this am-NAD-steady gait

## 2017-06-22 NOTE — ED Notes (Signed)
Upon entering room pt on phone. Introduced self to pt. Stated PA had been in to see her but she was going to leave and go to her doctor. States "i'm afraid I'm going to catch something being here. Pt pleasant. PA returned bedside prior to pt leaving. Offered care which pt declined. Pt advised she can return at any time.

## 2017-06-22 NOTE — ED Provider Notes (Signed)
[facility] EMERGENCY DEPARTMENT Provider Note   CSN: [id number] Arrival date & time: 06/22/17  1248     History   Chief Complaint Chief Complaint  Patient presents with  . Diarrhea    HPI Linda Phelps is a 47 y.o. female with past medical history significant for CHF, diabetes, hyperlipidemia, hypertension, low back pain, obesity and hypothyroidism presenting with sudden onset abdominal/back discomfort and diarrhea early this morning. She states that she has chronic back pain and multiple medical problems and is unsure what is new or chronic, having difficulties deciphering. Patient also reports right leg pain from the hip all the way to the foot described as a dull ache and feeling tired when she walks. No injury or trauma. This also started in the middle of the night. Resolved at this time.  Alleviated by rest aggravated by walking.  Reports tingling in her feet bilaterally.  Denies swelling in her lower extremities. No history of ill contacts.   HPI  Past Medical History:  Diagnosis Date  . CHF (congestive heart failure) ([place])   . Diabetes mellitus without complication ([place])   . Ectopic pregnancy   . High cholesterol   . Hypertension   . Low back pain   . Obesity   . Thyroid disease     Patient Active Problem List   Diagnosis Date Noted  . Chronic right-sided low back pain without sciatica 07/11/2016  . Acute non intractable tension-type headache 05/18/2016  . Abnormal chest x-ray 05/18/2016  . Vitamin D insufficiency 01/13/2016  . Menstrual cycle problem 01/11/2016  . Hordeolum externum (stye) 01/11/2016  . Obesity (BMI 30.0-34.9) 10/01/2015  . Palpitations 07/26/2015  . GERD (gastroesophageal reflux disease) 07/26/2015  . Generalized anxiety disorder 07/26/2015  . Poor dentition 04/13/2015  . Cardiomegaly 09/23/2014  . Diabetes type 2, controlled ([place]) 07/03/2014  . Essential hypertension 07/03/2014  . Hyperlipidemia 07/03/2014  . Hypothyroidism  07/03/2014    Past Surgical History:  Procedure Laterality Date  . APPENDECTOMY    . CHOLECYSTECTOMY      OB History    No data available       Home Medications    Prior to Admission medications   Medication Sig Start Date End Date Taking? Authorizing Provider  acetaminophen (TYLENOL) 500 MG tablet Take 2 tablets (1,000 mg total) by mouth every 6 (six) hours as needed for mild pain or moderate pain. 01/08/17   Alfonse Spruce, FNP  amLODipine (NORVASC) 10 MG tablet Take 1 tablet (10 mg total) by mouth daily. 05/09/17   Argentina Donovan, PA-C  aspirin 81 MG tablet Take 81 mg by mouth daily.    [provider]  carvedilol (COREG) 12.5 MG tablet Take 1 tablet (12.5 mg total) by mouth 2 (two) times daily with a meal. 05/09/17   McClung, Dionne Bucy, PA-C  citalopram (CELEXA) 20 MG tablet Take 1 tablet (20 mg total) by mouth daily. 05/09/17   Argentina Donovan, PA-C  famotidine (PEPCID) 20 MG tablet Take 1 tablet (20 mg total) by mouth 2 (two) times daily. 05/09/17   Argentina Donovan, PA-C  fluticasone (FLONASE) 50 MCG/ACT nasal spray Place 2 sprays into both nostrils daily. Patient not taking: Reported on 05/09/2017 07/11/16   Boykin Nearing, MD  furosemide (LASIX) 20 MG tablet Take 1 tablet (20 mg total) by mouth 2 (two) times daily. 05/09/17   Argentina Donovan, PA-C  levothyroxine (SYNTHROID, LEVOTHROID) 50 MCG tablet Take 1 tablet (50 mcg total) by mouth daily.  05/09/17   Argentina Donovan, PA-C  lisinopril (PRINIVIL,ZESTRIL) 40 MG tablet Take 1 tablet (40 mg total) by mouth daily. 05/09/17   Argentina Donovan, PA-C  metFORMIN (GLUCOPHAGE XR) 500 MG 24 hr tablet Take 500 mg in morning and 1000 mg in evening 05/09/17   Freeman Caldron M, PA-C  methocarbamol (ROBAXIN) 500 MG tablet Take 1 tablet (500 mg total) by mouth every 8 (eight) hours as needed for muscle spasms. 05/09/17   Argentina Donovan, PA-C  pravastatin (PRAVACHOL) 20 MG tablet Take 1 tablet (20 mg total) by  mouth daily. 05/09/17   Argentina Donovan, PA-C  ranitidine (ZANTAC) 300 MG tablet Take 1 tablet (300 mg total) by mouth at bedtime. 05/09/17   Argentina Donovan, PA-C  sucralfate (CARAFATE) 1 GM/10ML suspension Take 10 mLs (1 g total) by mouth 3 (three) times daily with meals as needed. 06/03/17   Drenda Freeze, MD  traMADol (ULTRAM) 50 MG tablet Take 1 tablet (50 mg total) by mouth every 6 (six) hours as needed. 06/03/17   Drenda Freeze, MD    Family History Family History  Problem Relation Age of Onset  . Diabetes Mother   . Diabetes Maternal Grandmother   . Colon cancer Maternal Uncle 66    Social History Social History   Tobacco Use  . Smoking status: Never Smoker  . Smokeless tobacco: Never Used  Substance Use Topics  . Alcohol use: Yes    Alcohol/week: 0.0 oz    Comment: occ  . Drug use: No     Allergies   Ibuprofen   Review of Systems Review of Systems  Constitutional: Negative for chills, diaphoresis and fever.  Respiratory: Negative for cough, choking, chest tightness, shortness of breath, wheezing and stridor.   Cardiovascular: Negative for chest pain and leg swelling.  Gastrointestinal: Positive for abdominal pain and diarrhea. Negative for abdominal distention, blood in stool, nausea and vomiting.  Genitourinary: Negative for difficulty urinating and dysuria.  Musculoskeletal: Positive for back pain and myalgias. Negative for arthralgias, gait problem, joint swelling, neck pain and neck stiffness.  Skin: Negative for color change, pallor, rash and wound.  Neurological: Negative for dizziness and light-headedness.     Physical Exam Updated Vital Signs BP (!) 161/100 (BP Location: Left Arm)   Pulse 82   Temp 99.1 F (37.3 C) (Oral)   Resp 20   Ht 5\' 1"  (1.549 m)   Wt 90.3 kg (199 lb 1.2 oz)   LMP 10/08/2013   SpO2 99%   BMI 37.62 kg/m   Physical Exam  Constitutional: She is oriented to person, place, and time. She appears well-developed  and well-nourished. No distress.  Nontoxic appearing in no acute distress, sitting comfortably in bed  HENT:  Head: Atraumatic.  Eyes: Conjunctivae and EOM are normal. Right eye exhibits no discharge. Left eye exhibits no discharge. No scleral icterus.  Neck: Normal range of motion.  Cardiovascular: Normal rate.  Pulmonary/Chest: Effort normal. No stridor. No respiratory distress.  Abdominal: She exhibits no distension.  Musculoskeletal: Normal range of motion. She exhibits no edema or deformity.  Neurological: She is alert and oriented to person, place, and time.  Normal stance and gait ambulating without difficulties in the emergency department.  Skin: She is not diaphoretic. No erythema. No pallor.  Psychiatric: She has a normal mood and affect. Her behavior is normal.  Nursing note and vitals reviewed.    ED Treatments / Results  Labs (all labs ordered are listed,  but only abnormal results are displayed) Labs Reviewed - No data to display  EKG  EKG Interpretation None       Radiology No results found.  Procedures Procedures (including critical care time)  Medications Ordered in ED Medications - No data to display   Initial Impression / Assessment and Plan / ED Course  I have reviewed the triage vital signs and the nursing notes.  Pertinent labs & imaging results that were available during my care of the patient were reviewed by me and considered in my medical decision making (see chart for details).    Patient presenting with multiple vague complaints including abdominal cramping with back pain and diarrhea as well as right leg fatigue and numbing ache when she walks which all started in the early hours this morning.  I had to step out of the room after obtaining patient history and was unable to complete her physical exam as patient had already gotten dressed and was walking out of the room stating that she would try to reach her primary care provider to see if  she could be seen in clinic.  Declined anything for her symptoms stating that she had medications at home to help with the diarrhea.  Patient ambulated without difficulties, well-appearing, nontoxic and afebrile.  I let patient know that she can always come back to the emergency department if symptoms worsen or any new concerning symptoms.  Final Clinical Impressions(s) / ED Diagnoses   Final diagnoses:  Diarrhea in adult patient    ED Discharge Orders    None       Dossie Der 06/22/17 1410    Gareth Morgan, MD 06/24/17 2207

## 2017-07-18 ENCOUNTER — Other Ambulatory Visit: Payer: Self-pay

## 2017-07-18 ENCOUNTER — Encounter (HOSPITAL_BASED_OUTPATIENT_CLINIC_OR_DEPARTMENT_OTHER): Payer: Self-pay | Admitting: Emergency Medicine

## 2017-07-18 ENCOUNTER — Emergency Department (HOSPITAL_BASED_OUTPATIENT_CLINIC_OR_DEPARTMENT_OTHER): Payer: Self-pay

## 2017-07-18 ENCOUNTER — Emergency Department (HOSPITAL_BASED_OUTPATIENT_CLINIC_OR_DEPARTMENT_OTHER)
Admission: EM | Admit: 2017-07-18 | Discharge: 2017-07-18 | Disposition: A | Discharge status: Home / Self Care | Payer: Self-pay | Attending: Emergency Medicine | Admitting: Emergency Medicine

## 2017-07-18 DIAGNOSIS — R002 Palpitations: Secondary | ICD-10-CM | POA: Insufficient documentation

## 2017-07-18 DIAGNOSIS — Z7984 Long term (current) use of oral hypoglycemic drugs: Secondary | ICD-10-CM | POA: Insufficient documentation

## 2017-07-18 DIAGNOSIS — E039 Hypothyroidism, unspecified: Secondary | ICD-10-CM | POA: Insufficient documentation

## 2017-07-18 DIAGNOSIS — E119 Type 2 diabetes mellitus without complications: Secondary | ICD-10-CM | POA: Insufficient documentation

## 2017-07-18 DIAGNOSIS — Z7982 Long term (current) use of aspirin: Secondary | ICD-10-CM | POA: Insufficient documentation

## 2017-07-18 DIAGNOSIS — I110 Hypertensive heart disease with heart failure: Secondary | ICD-10-CM | POA: Insufficient documentation

## 2017-07-18 DIAGNOSIS — I509 Heart failure, unspecified: Secondary | ICD-10-CM | POA: Insufficient documentation

## 2017-07-18 DIAGNOSIS — R42 Dizziness and giddiness: Secondary | ICD-10-CM | POA: Insufficient documentation

## 2017-07-18 DIAGNOSIS — Z79899 Other long term (current) drug therapy: Secondary | ICD-10-CM | POA: Insufficient documentation

## 2017-07-18 DIAGNOSIS — R2242 Localized swelling, mass and lump, left lower limb: Secondary | ICD-10-CM | POA: Insufficient documentation

## 2017-07-18 LAB — COMPREHENSIVE METABOLIC PANEL
ALT: 21 U/L (ref 14–54)
ANION GAP: 9 (ref 5–15)
AST: 31 U/L (ref 15–41)
Albumin: 3.7 g/dL (ref 3.5–5.0)
Alkaline Phosphatase: 58 U/L (ref 38–126)
BILIRUBIN TOTAL: 0.8 mg/dL (ref 0.3–1.2)
BUN: 15 mg/dL (ref 6–20)
CHLORIDE: 101 mmol/L (ref 101–111)
CO2: 30 mmol/L (ref 22–32)
Calcium: 9.4 mg/dL (ref 8.9–10.3)
Creatinine, Ser: 1 mg/dL (ref 0.44–1.00)
GFR calc Af Amer: >60 mL/min (ref >60)
Glucose, Bld: 124 mg/dL — ABNORMAL HIGH (ref 65–99)
POTASSIUM: 3.8 mmol/L (ref 3.5–5.1)
Sodium: 140 mmol/L (ref 135–145)
TOTAL PROTEIN: 7.3 g/dL (ref 6.5–8.1)

## 2017-07-18 LAB — CBC WITH DIFFERENTIAL/PLATELET
Basophils Absolute: 0 10*3/uL (ref 0.0–0.1)
Basophils Relative: 0 %
EOS ABS: 0.2 10*3/uL (ref 0.0–0.7)
EOS PCT: 3 %
HCT: 37.9 % (ref 36.0–46.0)
Hemoglobin: 12.6 g/dL (ref 12.0–15.0)
LYMPHS ABS: 2.6 10*3/uL (ref 0.7–4.0)
LYMPHS PCT: 47 %
MCH: 30 pg (ref 26.0–34.0)
MCHC: 33.2 g/dL (ref 30.0–36.0)
MCV: 90.2 fL (ref 78.0–100.0)
MONOS PCT: 10 %
Monocytes Absolute: 0.5 10*3/uL (ref 0.1–1.0)
Neutro Abs: 2.3 10*3/uL (ref 1.7–7.7)
Neutrophils Relative %: 40 %
PLATELETS: 275 10*3/uL (ref 150–400)
RBC: 4.2 MIL/uL (ref 3.87–5.11)
RDW: 12.9 % (ref 11.5–15.5)
WBC: 5.6 10*3/uL (ref 4.0–10.5)

## 2017-07-18 LAB — TROPONIN I

## 2017-07-18 NOTE — ED Notes (Signed)
Patient transported to Ultrasound 

## 2017-07-18 NOTE — ED Notes (Signed)
ED Provider at bedside. 

## 2017-07-18 NOTE — Discharge Instructions (Signed)
Please follow up with your cardiologist for possible holter monitor if they think it is appropriate if continue to have palpitations. Avoid caffeine. Stop benadryl. Return if worsening symptoms.

## 2017-07-18 NOTE — ED Triage Notes (Addendum)
Pt sts she has episodes of feeling her heartbeat in her throat for about 10 seconds at a time, since last night. Not feeling them at this time.  Also left lower leg swelling that she noticed this morning.  Denies any pain.

## 2017-07-18 NOTE — ED Notes (Signed)
PA student at bedside.

## 2017-07-18 NOTE — ED Provider Notes (Signed)
[facility] EMERGENCY DEPARTMENT Provider Note   CSN: [id number] Arrival date & time: 07/18/17  8144     History   Chief Complaint Chief Complaint  Patient presents with  . Palpitations    HPI Linda Phelps is a 47 y.o. female.  HPI Linda Phelps is a 47 y.o. female presents to ED with palpations. Pt states symptoms come and go, lasting about 10sec at a time. Feels it in her throat, radiates into chest, and under right breast. Reports associated left leg swelling. Denies SOB. No exertional symptoms. Symptoms are worsened with eating and stress. States symptoms began when she started to take benadryl for congestion, that she takes at bed time. Denies any other changes in her medications. She does not smoke. Does not drink caffeeine. No recent travel or surgeries. States she is having pain in left leg since last night, pain in calf, worse with walking. No hx of palpitations in the past. States "when I get them, I feel like I am going to pass out." Last episode this morning. Pt also admits to hx of anxiety.   Past Medical History:  Diagnosis Date  . CHF (congestive heart failure) ([place])   . Diabetes mellitus without complication ([place])   . Ectopic pregnancy   . High cholesterol   . Hypertension   . Low back pain   . Obesity   . Thyroid disease     Patient Active Problem List   Diagnosis Date Noted  . Chronic right-sided low back pain without sciatica 07/11/2016  . Acute non intractable tension-type headache 05/18/2016  . Abnormal chest x-ray 05/18/2016  . Vitamin D insufficiency 01/13/2016  . Menstrual cycle problem 01/11/2016  . Hordeolum externum (stye) 01/11/2016  . Obesity (BMI 30.0-34.9) 10/01/2015  . Palpitations 07/26/2015  . GERD (gastroesophageal reflux disease) 07/26/2015  . Generalized anxiety disorder 07/26/2015  . Poor dentition 04/13/2015  . Cardiomegaly 09/23/2014  . Diabetes type 2, controlled ([place]) 07/03/2014  . Essential hypertension 07/03/2014  .  Hyperlipidemia 07/03/2014  . Hypothyroidism 07/03/2014    Past Surgical History:  Procedure Laterality Date  . APPENDECTOMY    . CHOLECYSTECTOMY      OB History    No data available       Home Medications    Prior to Admission medications   Medication Sig Start Date End Date Taking? Authorizing Provider  acetaminophen (TYLENOL) 500 MG tablet Take 2 tablets (1,000 mg total) by mouth every 6 (six) hours as needed for mild pain or moderate pain. 01/08/17   Alfonse Spruce, FNP  amLODipine (NORVASC) 10 MG tablet Take 1 tablet (10 mg total) by mouth daily. 05/09/17   Argentina Donovan, PA-C  aspirin 81 MG tablet Take 81 mg by mouth daily.    [provider]  carvedilol (COREG) 12.5 MG tablet Take 1 tablet (12.5 mg total) by mouth 2 (two) times daily with a meal. 05/09/17   McClung, Dionne Bucy, PA-C  citalopram (CELEXA) 20 MG tablet Take 1 tablet (20 mg total) by mouth daily. 05/09/17   Argentina Donovan, PA-C  famotidine (PEPCID) 20 MG tablet Take 1 tablet (20 mg total) by mouth 2 (two) times daily. 05/09/17   Argentina Donovan, PA-C  fluticasone (FLONASE) 50 MCG/ACT nasal spray Place 2 sprays into both nostrils daily. Patient not taking: Reported on 05/09/2017 07/11/16   Boykin Nearing, MD  furosemide (LASIX) 20 MG tablet Take 1 tablet (20 mg total) by mouth 2 (two) times daily. 05/09/17  Freeman Caldron M, PA-C  levothyroxine (SYNTHROID, LEVOTHROID) 50 MCG tablet Take 1 tablet (50 mcg total) by mouth daily. 05/09/17   Argentina Donovan, PA-C  lisinopril (PRINIVIL,ZESTRIL) 40 MG tablet Take 1 tablet (40 mg total) by mouth daily. 05/09/17   Argentina Donovan, PA-C  metFORMIN (GLUCOPHAGE XR) 500 MG 24 hr tablet Take 500 mg in morning and 1000 mg in evening 05/09/17   Freeman Caldron M, PA-C  methocarbamol (ROBAXIN) 500 MG tablet Take 1 tablet (500 mg total) by mouth every 8 (eight) hours as needed for muscle spasms. 05/09/17   Argentina Donovan, PA-C  pravastatin (PRAVACHOL)  20 MG tablet Take 1 tablet (20 mg total) by mouth daily. 05/09/17   Argentina Donovan, PA-C  ranitidine (ZANTAC) 300 MG tablet Take 1 tablet (300 mg total) by mouth at bedtime. 05/09/17   Argentina Donovan, PA-C  sucralfate (CARAFATE) 1 GM/10ML suspension Take 10 mLs (1 g total) by mouth 3 (three) times daily with meals as needed. 06/03/17   Drenda Freeze, MD  traMADol (ULTRAM) 50 MG tablet Take 1 tablet (50 mg total) by mouth every 6 (six) hours as needed. 06/03/17   Drenda Freeze, MD    Family History Family History  Problem Relation Age of Onset  . Diabetes Mother   . Diabetes Maternal Grandmother   . Colon cancer Maternal Uncle 72    Social History Social History   Tobacco Use  . Smoking status: Never Smoker  . Smokeless tobacco: Never Used  Substance Use Topics  . Alcohol use: Yes    Alcohol/week: 0.0 oz    Comment: occ  . Drug use: No     Allergies   Ibuprofen   Review of Systems Review of Systems  Constitutional: Negative for chills and fever.  Respiratory: Positive for chest tightness. Negative for cough and shortness of breath.   Cardiovascular: Positive for chest pain, palpitations and leg swelling.  Gastrointestinal: Negative for abdominal pain, diarrhea, nausea and vomiting.  Genitourinary: Negative for dysuria, flank pain, pelvic pain, vaginal bleeding, vaginal discharge and vaginal pain.  Musculoskeletal: Negative for arthralgias, myalgias, neck pain and neck stiffness.  Skin: Negative for rash.  Neurological: Positive for dizziness and light-headedness. Negative for weakness and headaches.  All other systems reviewed and are negative.    Physical Exam Updated Vital Signs BP (!) 167/100 (BP Location: Right Arm)   Pulse 86   Temp 98.8 F (37.1 C) (Oral)   Resp 16   Ht 5\' 1"  (1.549 m)   Wt 86.2 kg (190 lb)   LMP 10/08/2013   SpO2 100%   BMI 35.90 kg/m   Physical Exam  Constitutional: She is oriented to person, place, and time. She  appears well-developed and well-nourished. No distress.  HENT:  Head: Normocephalic.  Eyes: Conjunctivae are normal.  Neck: Neck supple.  Cardiovascular: Normal rate, regular rhythm and normal heart sounds.  Pulmonary/Chest: Effort normal and breath sounds normal. No respiratory distress. She has no wheezes. She has no rales.  Abdominal: Soft. Bowel sounds are normal. She exhibits no distension. There is no tenderness. There is no rebound.  Musculoskeletal: She exhibits no edema.  No obvious LE swelling. ttp in left calf. Negative homans sign.   Neurological: She is alert and oriented to person, place, and time.  Skin: Skin is warm and dry.  Psychiatric: She has a normal mood and affect. Her behavior is normal.  Nursing note and vitals reviewed.    ED Treatments /  Results  Labs (all labs ordered are listed, but only abnormal results are displayed) Labs Reviewed  COMPREHENSIVE METABOLIC PANEL - Abnormal; Notable for the following components:      Result Value   Glucose, Bld 124 (*)    All other components within normal limits  CBC WITH DIFFERENTIAL/PLATELET  TROPONIN I    EKG  EKG Interpretation  Date/Time:  Wednesday July 18 2017 09:10:21 EST Ventricular Rate:  92 PR Interval:    QRS Duration: 95 QT Interval:  357 QTC Calculation: 442 R Axis:   40 Text Interpretation:  Sinus rhythm Probable left ventricular hypertrophy Borderline T abnormalities, inferior leads No significant change since last tracing Confirmed by Orlie Dakin [phone]) on 07/18/2017 9:20:13 AM       Radiology US Venous Img Lower Unilateral Left  Result Date: 07/18/2017 CLINICAL DATA:  Left lower extremity pain and edema. Evaluate for DVT. EXAM: LEFT LOWER EXTREMITY VENOUS DOPPLER ULTRASOUND TECHNIQUE: Gray-scale sonography with graded compression, as well as color Doppler and duplex ultrasound were performed to evaluate the lower extremity deep venous systems from the level of the common femoral  vein and including the common femoral, femoral, profunda femoral, popliteal and calf veins including the posterior tibial, peroneal and gastrocnemius veins when visible. The superficial great saphenous vein was also interrogated. Spectral Doppler was utilized to evaluate flow at rest and with distal augmentation maneuvers in the common femoral, femoral and popliteal veins. COMPARISON:  Left lower extremity venous Doppler ultrasound - 04/11/2017 FINDINGS: Contralateral Common Femoral Vein: Respiratory phasicity is normal and symmetric with the symptomatic side. No evidence of thrombus. Normal compressibility. Common Femoral Vein: No evidence of thrombus. Normal compressibility, respiratory phasicity and response to augmentation. Saphenofemoral Junction: No evidence of thrombus. Normal compressibility and flow on color Doppler imaging. Profunda Femoral Vein: No evidence of thrombus. Normal compressibility and flow on color Doppler imaging. Femoral Vein: No evidence of thrombus. Normal compressibility, respiratory phasicity and response to augmentation. Popliteal Vein: No evidence of thrombus. Normal compressibility, respiratory phasicity and response to augmentation. Calf Veins: No evidence of thrombus. Normal compressibility and flow on color Doppler imaging. Superficial Great Saphenous Vein: No evidence of thrombus. Normal compressibility. Venous Reflux:  None. Other Findings:  None. IMPRESSION: No evidence of DVT within the left lower extremity. Electronically Signed   By: Sandi Mariscal M.D.   On: 07/18/2017 10:52    Procedures Procedures (including critical care time)  Medications Ordered in ED Medications - No data to display   Initial Impression / Assessment and Plan / ED Course  I have reviewed the triage vital signs and the nursing notes.  Pertinent labs & imaging results that were available during my care of the patient were reviewed by me and considered in my medical decision making (see chart for  details).     Patient in emergency department with palpitations, states mainly feels numb in her throat.  She states these palpitations come and go, she states she feels her heart beat fast for about 10 seconds at a time, and feels like she may pass out during these episodes.  She reports associated chest pain that radiates into the center of her chest and right breast.  She reports history of chest pain in the past but never palpitations.  She was seen in emergency department 3 months ago for this chest pain and had a negative workup including cardiac enzymes, EKG, CT angio of the chest. Heart score of 3. PERC negative. Will however get venous US of left  leg, since pt is having pain, however, no swelling seen on exam. Will get basic labs. Question PVCs vs arrhythmia vs anxiety. Will monitor.    11:22 AM Labs unremarkable. Venous duplex negative. Doubt DVT, doubt PE with normal VS. PERC negative. Some PVCs on monitor, wonder if that is what is responsible for pt's palpitations. Will dc home, advised to follow up with her cardiologist for possible holter monitor if they think its appropriate. Pt agreed. Home with normal VS, non toxic, symptom free.   Vitals:   07/18/17 0904 07/18/17 0908 07/18/17 1048  BP:  (!) 167/100 133/84  Pulse:  86 87  Resp:  16 16  Temp:  98.8 F (37.1 C)   TempSrc:  Oral   SpO2:  100% 98%  Weight: 86.2 kg (190 lb)    Height: 5\' 1"  (1.549 m)       Final Clinical Impressions(s) / ED Diagnoses   Final diagnoses:  Palpitations    ED Discharge Orders    None       Jeannett Senior, PA-C 07/18/17 1126    Orlie Dakin, MD 07/18/17 1537

## 2017-07-20 ENCOUNTER — Other Ambulatory Visit: Payer: Self-pay

## 2017-07-20 ENCOUNTER — Encounter (HOSPITAL_BASED_OUTPATIENT_CLINIC_OR_DEPARTMENT_OTHER): Payer: Self-pay

## 2017-07-20 ENCOUNTER — Emergency Department (HOSPITAL_BASED_OUTPATIENT_CLINIC_OR_DEPARTMENT_OTHER)
Admission: EM | Admit: 2017-07-20 | Discharge: 2017-07-20 | Disposition: A | Discharge status: Home / Self Care | Payer: Self-pay | Attending: Emergency Medicine | Admitting: Emergency Medicine

## 2017-07-20 DIAGNOSIS — Z7982 Long term (current) use of aspirin: Secondary | ICD-10-CM | POA: Insufficient documentation

## 2017-07-20 DIAGNOSIS — I509 Heart failure, unspecified: Secondary | ICD-10-CM | POA: Insufficient documentation

## 2017-07-20 DIAGNOSIS — I110 Hypertensive heart disease with heart failure: Secondary | ICD-10-CM | POA: Insufficient documentation

## 2017-07-20 DIAGNOSIS — J111 Influenza due to unidentified influenza virus with other respiratory manifestations: Secondary | ICD-10-CM | POA: Insufficient documentation

## 2017-07-20 DIAGNOSIS — Z7984 Long term (current) use of oral hypoglycemic drugs: Secondary | ICD-10-CM | POA: Insufficient documentation

## 2017-07-20 DIAGNOSIS — Z79899 Other long term (current) drug therapy: Secondary | ICD-10-CM | POA: Insufficient documentation

## 2017-07-20 DIAGNOSIS — R69 Illness, unspecified: Secondary | ICD-10-CM

## 2017-07-20 DIAGNOSIS — E039 Hypothyroidism, unspecified: Secondary | ICD-10-CM | POA: Insufficient documentation

## 2017-07-20 DIAGNOSIS — E119 Type 2 diabetes mellitus without complications: Secondary | ICD-10-CM | POA: Insufficient documentation

## 2017-07-20 DIAGNOSIS — R110 Nausea: Secondary | ICD-10-CM | POA: Insufficient documentation

## 2017-07-20 MED ORDER — ONDANSETRON 8 MG PO TBDP: 8.0000 mg | ORAL_TABLET | Freq: Once | ORAL | Status: DC

## 2017-07-20 MED ORDER — ONDANSETRON 8 MG PO TBDP: 8.0000 mg | ORAL_TABLET | Freq: Three times a day (TID) | ORAL | 0 refills | Status: DC | PRN

## 2017-07-20 MED ORDER — ACETAMINOPHEN 500 MG PO TABS
1000.0000 mg | ORAL_TABLET | Freq: Once | ORAL | Status: AC
  Administered 2017-07-20: 1000 mg via ORAL
  Filled 2017-07-20: qty 2

## 2017-07-20 NOTE — ED Notes (Signed)
Pt reports generalized body aches since her visit here on 2/27. Pt states she did not mention this to the previous she saw because she felt he was not interested in caring for her.

## 2017-07-20 NOTE — ED Triage Notes (Signed)
Pt c/o midline chest pain, right neck pain, and upper back pain with fever and nausea, pt tried taking her zantac and pepcid earlier tonight without relief, fever in triage, has been seen several times in the ED during flu season

## 2017-07-20 NOTE — ED Provider Notes (Signed)
[facility] Provider Note: Linda Spurling, MD, FACEP  CSN: [id number] MRN: [id number] ARRIVAL: 07/20/17 at [place]: [place]  Generalized Body Aches   HISTORY OF PRESENT ILLNESS  07/20/17 4:15 AM Linda Phelps is a 47 y.o. female has had the gradual onset of generalized body pains over the past 2 days.  This has been associated with nausea but no vomiting or diarrhea.  She took Pepcid and Zantac without relief of her nausea.  She has also been having nasal congestion and cough.  She was noted to be febrile with a temperature of 100.4 on arrival.  She was given Tylenol with improvement.  She rates her pain as an 8 out of 10.    Past Medical History:  Diagnosis Date  . CHF (congestive heart failure) ([place])   . Diabetes mellitus without complication ([place])   . Ectopic pregnancy   . High cholesterol   . Hypertension   . Low back pain   . Obesity   . Thyroid disease     Past Surgical History:  Procedure Laterality Date  . APPENDECTOMY    . CHOLECYSTECTOMY      Family History  Problem Relation Age of Onset  . Diabetes Mother   . Diabetes Maternal Grandmother   . Colon cancer Maternal Uncle 76    Social History   Tobacco Use  . Smoking status: Never Smoker  . Smokeless tobacco: Never Used  Substance Use Topics  . Alcohol use: Yes    Alcohol/week: 0.0 oz    Comment: occ  . Drug use: No    Prior to Admission medications   Medication Sig Start Date End Date Taking? Authorizing Provider  acetaminophen (TYLENOL) 500 MG tablet Take 2 tablets (1,000 mg total) by mouth every 6 (six) hours as needed for mild pain or moderate pain. 01/08/17   Alfonse Spruce, FNP  amLODipine (NORVASC) 10 MG tablet Take 1 tablet (10 mg total) by mouth daily. 05/09/17   Argentina Donovan, PA-C  aspirin 81 MG tablet Take 81 mg by mouth daily.    [provider]  carvedilol (COREG) 12.5 MG tablet Take 1 tablet (12.5 mg total) by mouth 2 (two) times daily  with a meal. 05/09/17   McClung, Dionne Bucy, PA-C  citalopram (CELEXA) 20 MG tablet Take 1 tablet (20 mg total) by mouth daily. 05/09/17   Argentina Donovan, PA-C  famotidine (PEPCID) 20 MG tablet Take 1 tablet (20 mg total) by mouth 2 (two) times daily. 05/09/17   Argentina Donovan, PA-C  furosemide (LASIX) 20 MG tablet Take 1 tablet (20 mg total) by mouth 2 (two) times daily. 05/09/17   Argentina Donovan, PA-C  levothyroxine (SYNTHROID, LEVOTHROID) 50 MCG tablet Take 1 tablet (50 mcg total) by mouth daily. 05/09/17   Argentina Donovan, PA-C  lisinopril (PRINIVIL,ZESTRIL) 40 MG tablet Take 1 tablet (40 mg total) by mouth daily. 05/09/17   Argentina Donovan, PA-C  metFORMIN (GLUCOPHAGE XR) 500 MG 24 hr tablet Take 500 mg in morning and 1000 mg in evening 05/09/17   Freeman Caldron M, PA-C  methocarbamol (ROBAXIN) 500 MG tablet Take 1 tablet (500 mg total) by mouth every 8 (eight) hours as needed for muscle spasms. 05/09/17   Argentina Donovan, PA-C  ondansetron (ZOFRAN ODT) 8 MG disintegrating tablet Take 1 tablet (8 mg total) by mouth every 8 (eight) hours as needed for nausea or vomiting. 07/20/17   Daun Rens, Jenny Reichmann, MD  pravastatin (PRAVACHOL) 20 MG tablet Take 1 tablet (20 mg total) by mouth daily. 05/09/17   Argentina Donovan, PA-C  ranitidine (ZANTAC) 300 MG tablet Take 1 tablet (300 mg total) by mouth at bedtime. 05/09/17   Argentina Donovan, PA-C  sucralfate (CARAFATE) 1 GM/10ML suspension Take 10 mLs (1 g total) by mouth 3 (three) times daily with meals as needed. 06/03/17   Drenda Freeze, MD  traMADol (ULTRAM) 50 MG tablet Take 1 tablet (50 mg total) by mouth every 6 (six) hours as needed. 06/03/17   Drenda Freeze, MD    Allergies Ibuprofen   REVIEW OF SYSTEMS  Negative except as noted here or in the History of Present Illness.   PHYSICAL EXAMINATION  Initial Vital Signs Blood pressure (!) 184/93, pulse 98, temperature 98.9 F (37.2 C), temperature source Oral, resp. rate 18,  height 5\' 1"  (1.549 m), weight 86.2 kg (190 lb), last menstrual period 10/08/2013, SpO2 98 %.  Examination General: Well-developed, well-nourished female in no acute distress; appearance consistent with age of record HENT: normocephalic; atraumatic Eyes: pupils equal, round and reactive to light; extraocular muscles intact Neck: supple Heart: regular rate and rhythm Lungs: clear to auscultation bilaterally Abdomen: soft; nondistended; nontender; bowel sounds present Extremities: No deformity; full range of motion; pulses normal Neurologic: Awake, alert and oriented; motor function intact in all extremities and symmetric; no facial droop Skin: Warm and dry Psychiatric: Normal mood and affect   RESULTS  Summary of this visit's results, reviewed by myself:   EKG Interpretation  Date/Time:    Ventricular Rate:    PR Interval:    QRS Duration:   QT Interval:    QTC Calculation:   R Axis:     Text Interpretation:        Laboratory Studies: No results found for this or any previous visit (from the past 24 hour(s)). Imaging Studies: US Venous Img Lower Unilateral Left  Result Date: 07/18/2017 CLINICAL DATA:  Left lower extremity pain and edema. Evaluate for DVT. EXAM: LEFT LOWER EXTREMITY VENOUS DOPPLER ULTRASOUND TECHNIQUE: Gray-scale sonography with graded compression, as well as color Doppler and duplex ultrasound were performed to evaluate the lower extremity deep venous systems from the level of the common femoral vein and including the common femoral, femoral, profunda femoral, popliteal and calf veins including the posterior tibial, peroneal and gastrocnemius veins when visible. The superficial great saphenous vein was also interrogated. Spectral Doppler was utilized to evaluate flow at rest and with distal augmentation maneuvers in the common femoral, femoral and popliteal veins. COMPARISON:  Left lower extremity venous Doppler ultrasound - 04/11/2017 FINDINGS: Contralateral  Common Femoral Vein: Respiratory phasicity is normal and symmetric with the symptomatic side. No evidence of thrombus. Normal compressibility. Common Femoral Vein: No evidence of thrombus. Normal compressibility, respiratory phasicity and response to augmentation. Saphenofemoral Junction: No evidence of thrombus. Normal compressibility and flow on color Doppler imaging. Profunda Femoral Vein: No evidence of thrombus. Normal compressibility and flow on color Doppler imaging. Femoral Vein: No evidence of thrombus. Normal compressibility, respiratory phasicity and response to augmentation. Popliteal Vein: No evidence of thrombus. Normal compressibility, respiratory phasicity and response to augmentation. Calf Veins: No evidence of thrombus. Normal compressibility and flow on color Doppler imaging. Superficial Great Saphenous Vein: No evidence of thrombus. Normal compressibility. Venous Reflux:  None. Other Findings:  None. IMPRESSION: No evidence of DVT within the left lower extremity. Electronically Signed   By: Sandi Mariscal M.D.   On: 07/18/2017 10:52  ED COURSE  Nursing notes and initial vitals signs, including pulse oximetry, reviewed.  Vitals:   07/20/17 0230 07/20/17 0231 07/20/17 0333  BP: (!) 184/93    Pulse: 98    Resp: 18    Temp: (!) 100.4 F (38 C)  98.9 F (37.2 C)  TempSrc: Oral  Oral  SpO2: 98%    Weight:  86.2 kg (190 lb)   Height:  5\' 1"  (1.549 m)    Symptoms consistent with a viral illness, likely influenza.  Patient declined Tamiflu.  PROCEDURES    ED DIAGNOSES     ICD-10-CM   1. Influenza-like illness R69        Aluel Schwarz, Jenny Reichmann, MD 07/20/17 [phone]

## 2017-08-23 ENCOUNTER — Other Ambulatory Visit: Payer: Self-pay | Admitting: Physician Assistant

## 2017-08-23 DIAGNOSIS — I10 Essential (primary) hypertension: Secondary | ICD-10-CM

## 2017-09-11 ENCOUNTER — Ambulatory Visit: Payer: Self-pay | Admitting: Nurse Practitioner

## 2017-09-18 ENCOUNTER — Emergency Department (HOSPITAL_BASED_OUTPATIENT_CLINIC_OR_DEPARTMENT_OTHER): Payer: Self-pay

## 2017-09-18 ENCOUNTER — Encounter (HOSPITAL_BASED_OUTPATIENT_CLINIC_OR_DEPARTMENT_OTHER): Payer: Self-pay | Admitting: *Deleted

## 2017-09-18 ENCOUNTER — Other Ambulatory Visit: Payer: Self-pay

## 2017-09-18 ENCOUNTER — Emergency Department (HOSPITAL_BASED_OUTPATIENT_CLINIC_OR_DEPARTMENT_OTHER)
Admission: EM | Admit: 2017-09-18 | Discharge: 2017-09-18 | Disposition: A | Discharge status: Home / Self Care | Payer: Self-pay | Attending: Emergency Medicine | Admitting: Emergency Medicine

## 2017-09-18 DIAGNOSIS — I110 Hypertensive heart disease with heart failure: Secondary | ICD-10-CM | POA: Insufficient documentation

## 2017-09-18 DIAGNOSIS — Z79899 Other long term (current) drug therapy: Secondary | ICD-10-CM | POA: Insufficient documentation

## 2017-09-18 DIAGNOSIS — Y9389 Activity, other specified: Secondary | ICD-10-CM | POA: Insufficient documentation

## 2017-09-18 DIAGNOSIS — Z7982 Long term (current) use of aspirin: Secondary | ICD-10-CM | POA: Insufficient documentation

## 2017-09-18 DIAGNOSIS — X509XXA Other and unspecified overexertion or strenuous movements or postures, initial encounter: Secondary | ICD-10-CM | POA: Insufficient documentation

## 2017-09-18 DIAGNOSIS — I509 Heart failure, unspecified: Secondary | ICD-10-CM | POA: Insufficient documentation

## 2017-09-18 DIAGNOSIS — Y998 Other external cause status: Secondary | ICD-10-CM | POA: Insufficient documentation

## 2017-09-18 DIAGNOSIS — Z7984 Long term (current) use of oral hypoglycemic drugs: Secondary | ICD-10-CM | POA: Insufficient documentation

## 2017-09-18 DIAGNOSIS — S29012A Strain of muscle and tendon of back wall of thorax, initial encounter: Secondary | ICD-10-CM | POA: Insufficient documentation

## 2017-09-18 DIAGNOSIS — Y92019 Unspecified place in single-family (private) house as the place of occurrence of the external cause: Secondary | ICD-10-CM | POA: Insufficient documentation

## 2017-09-18 DIAGNOSIS — E119 Type 2 diabetes mellitus without complications: Secondary | ICD-10-CM | POA: Insufficient documentation

## 2017-09-18 LAB — URINALYSIS, MICROSCOPIC (REFLEX)

## 2017-09-18 LAB — URINALYSIS, ROUTINE W REFLEX MICROSCOPIC
Bilirubin Urine: NEGATIVE
GLUCOSE, UA: NEGATIVE mg/dL
KETONES UR: 15 mg/dL — AB
LEUKOCYTES UA: NEGATIVE
Nitrite: NEGATIVE
Specific Gravity, Urine: >1.03 — ABNORMAL HIGH (ref 1.005–1.030)
pH: 6 (ref 5.0–8.0)

## 2017-09-18 LAB — PREGNANCY, URINE: PREG TEST UR: NEGATIVE

## 2017-09-18 MED ORDER — CYCLOBENZAPRINE HCL 10 MG PO TABS: 10.0000 mg | ORAL_TABLET | Freq: Two times a day (BID) | ORAL | 0 refills | Status: DC | PRN

## 2017-09-18 NOTE — ED Provider Notes (Signed)
[facility] EMERGENCY DEPARTMENT Provider Note   CSN: [id number] Arrival date & time: 09/18/17  1826     History   Chief Complaint Chief Complaint  Patient presents with  . Back Pain    HPI Linda Phelps is a 47 y.o. female.  HPI   Presents with concern for back pain, feels like sharp cramping pain like muscle spasms. Had to stop moving. Happened while doing housework yesterday.  Pain was severe started yesterday while doing house work.  When pain was severe had dyspnea but otherwise no shortness of breath, none today.  Middle of back with radiation around to the front at times. Worse with movements, bending over.   No abdominal pain. No urinary symptoms, hematuria, no fevers.     No recent travel or surgeries, not taking estrogens, no leg pain or swelling Diagnosed with bronchitis a few weeks ago, continuing to cough   Past Medical History:  Diagnosis Date  . CHF (congestive heart failure) ([place])   . Diabetes mellitus without complication ([place])   . Ectopic pregnancy   . High cholesterol   . Hypertension   . Low back pain   . Obesity   . Thyroid disease     Patient Active Problem List   Diagnosis Date Noted  . Chronic right-sided low back pain without sciatica 07/11/2016  . Acute non intractable tension-type headache 05/18/2016  . Abnormal chest x-ray 05/18/2016  . Vitamin D insufficiency 01/13/2016  . Menstrual cycle problem 01/11/2016  . Hordeolum externum (stye) 01/11/2016  . Obesity (BMI 30.0-34.9) 10/01/2015  . Palpitations 07/26/2015  . GERD (gastroesophageal reflux disease) 07/26/2015  . Generalized anxiety disorder 07/26/2015  . Poor dentition 04/13/2015  . Cardiomegaly 09/23/2014  . Diabetes type 2, controlled ([place]) 07/03/2014  . Essential hypertension 07/03/2014  . Hyperlipidemia 07/03/2014  . Hypothyroidism 07/03/2014    Past Surgical History:  Procedure Laterality Date  . APPENDECTOMY    . CHOLECYSTECTOMY       OB History   None      Home Medications    Prior to Admission medications   Medication Sig Start Date End Date Taking? Authorizing Provider  acetaminophen (TYLENOL) 500 MG tablet Take 2 tablets (1,000 mg total) by mouth every 6 (six) hours as needed for mild pain or moderate pain. 01/08/17   Alfonse Spruce, FNP  amLODipine (NORVASC) 10 MG tablet Take 1 tablet (10 mg total) by mouth daily. 05/09/17   Argentina Donovan, PA-C  aspirin 81 MG tablet Take 81 mg by mouth daily.    [provider]  carvedilol (COREG) 12.5 MG tablet take ONE TABLET BY MOUTH TWICE DAILY with meals 08/23/17   Gildardo Pounds, NP  citalopram (CELEXA) 20 MG tablet Take 1 tablet (20 mg total) by mouth daily. 05/09/17   Argentina Donovan, PA-C  cyclobenzaprine (FLEXERIL) 10 MG tablet Take 1 tablet (10 mg total) by mouth 2 (two) times daily as needed for muscle spasms. 09/18/17   Gareth Morgan, MD  famotidine (PEPCID) 20 MG tablet Take 1 tablet (20 mg total) by mouth 2 (two) times daily. 05/09/17   Argentina Donovan, PA-C  furosemide (LASIX) 20 MG tablet Take 1 tablet (20 mg total) by mouth 2 (two) times daily. 05/09/17   Argentina Donovan, PA-C  levothyroxine (SYNTHROID, LEVOTHROID) 50 MCG tablet Take 1 tablet (50 mcg total) by mouth daily. 05/09/17   Argentina Donovan, PA-C  lisinopril (PRINIVIL,ZESTRIL) 40 MG tablet Take 1 tablet (40 mg total) by  mouth daily. 05/09/17   Argentina Donovan, PA-C  metFORMIN (GLUCOPHAGE XR) 500 MG 24 hr tablet Take 500 mg in morning and 1000 mg in evening 05/09/17   Freeman Caldron M, PA-C  methocarbamol (ROBAXIN) 500 MG tablet Take 1 tablet (500 mg total) by mouth every 8 (eight) hours as needed for muscle spasms. 05/09/17   Argentina Donovan, PA-C  ondansetron (ZOFRAN ODT) 8 MG disintegrating tablet Take 1 tablet (8 mg total) by mouth every 8 (eight) hours as needed for nausea or vomiting. 07/20/17   Molpus, John, MD  pravastatin (PRAVACHOL) 20 MG tablet Take 1 tablet (20 mg total) by mouth daily.  05/09/17   Argentina Donovan, PA-C  ranitidine (ZANTAC) 300 MG tablet Take 1 tablet (300 mg total) by mouth at bedtime. 05/09/17   Argentina Donovan, PA-C  sucralfate (CARAFATE) 1 GM/10ML suspension Take 10 mLs (1 g total) by mouth 3 (three) times daily with meals as needed. 06/03/17   Drenda Freeze, MD  traMADol (ULTRAM) 50 MG tablet Take 1 tablet (50 mg total) by mouth every 6 (six) hours as needed. 06/03/17   Drenda Freeze, MD    Family History Family History  Problem Relation Age of Onset  . Diabetes Mother   . Diabetes Maternal Grandmother   . Colon cancer Maternal Uncle 72    Social History Social History   Tobacco Use  . Smoking status: Never Smoker  . Smokeless tobacco: Never Used  Substance Use Topics  . Alcohol use: Yes    Alcohol/week: 0.0 oz    Comment: occ  . Drug use: No     Allergies   Ibuprofen   Review of Systems Review of Systems  Constitutional: Negative for fever.  HENT: Negative for sore throat.   Eyes: Negative for visual disturbance.  Respiratory: Negative for cough and shortness of breath.   Cardiovascular: Negative for chest pain.  Gastrointestinal: Negative for abdominal pain, nausea and vomiting.  Genitourinary: Negative for difficulty urinating and dysuria.  Musculoskeletal: Positive for back pain. Negative for neck pain.  Skin: Negative for rash.  Neurological: Negative for syncope and headaches.     Physical Exam Updated Vital Signs BP (!) 167/95 (BP Location: Right Arm)   Pulse 93   Temp 98.6 F (37 C)   Resp 18   Ht 5\' 1"  (1.549 m)   Wt 90.7 kg (200 lb)   LMP 10/08/2013   SpO2 99%   BMI 37.79 kg/m   Physical Exam  Constitutional: She is oriented to person, place, and time. She appears well-developed and well-nourished. No distress.  HENT:  Head: Normocephalic and atraumatic.  Eyes: Conjunctivae and EOM are normal.  Neck: Normal range of motion.  Cardiovascular: Normal rate, regular rhythm, normal heart sounds  and intact distal pulses. Exam reveals no gallop and no friction rub.  No murmur heard. Pulmonary/Chest: Effort normal and breath sounds normal. No respiratory distress. She has no wheezes. She has no rales.  Abdominal: Soft. She exhibits no distension. There is no tenderness. There is no guarding.  Musculoskeletal: She exhibits tenderness (thoracic spine midline, extension to right paraspinal muscles). She exhibits no edema.  Neurological: She is alert and oriented to person, place, and time.  Skin: Skin is warm and dry. No rash noted. She is not diaphoretic. No erythema.  Nursing note and vitals reviewed.    ED Treatments / Results  Labs (all labs ordered are listed, but only abnormal results are displayed) Labs Reviewed  URINALYSIS,  ROUTINE W REFLEX MICROSCOPIC - Abnormal; Notable for the following components:      Result Value   Specific Gravity, Urine >1.030 (*)    Hgb urine dipstick TRACE (*)    Ketones, ur 15 (*)    Protein, ur >300 (*)    All other components within normal limits  URINALYSIS, MICROSCOPIC (REFLEX) - Abnormal; Notable for the following components:   Bacteria, UA RARE (*)    All other components within normal limits  PREGNANCY, URINE    EKG None  Radiology Dg Chest 2 View  Result Date: 09/18/2017 CLINICAL DATA:  Shortness of breath, cough EXAM: CHEST - 2 VIEW COMPARISON:  08/04/2017 FINDINGS: Lungs are clear.  No pleural effusion or pneumothorax. The heart is normal in size. Mild degenerative changes of the visualized thoracolumbar spine. Cholecystectomy clips. IMPRESSION: Normal chest radiographs. Electronically Signed   By: Julian Hy M.D.   On: 09/18/2017 19:34    Procedures Procedures (including critical care time)  Medications Ordered in ED Medications - No data to display   Initial Impression / Assessment and Plan / ED Course  I have reviewed the triage vital signs and the nursing notes.  Pertinent labs & imaging results that were  available during my care of the patient were reviewed by me and considered in my medical decision making (see chart for details).     47yo female with history above presents with right sided back pain that began while mopping yesterday.  Had dyspnea when pain was severe, but no longer now, no hypoxia, no asymmetric leg swelling, normal respiratory rate, no PE risk factors, and given pain more likely msk by history have low suspicion for pulmonary embolus. History, exam, urinalysis not consistent with pyelonephritis or nephrolithiasis.  Doubt cardiac etiology.  Normal pulses UE and LE and doubt dissection.   Given mechanical mechanism leading to pain, pain with movements and palpation, suspect likely msk etiology such as thoracic muscle strain and possibly disc etiology. No signs of cord compression.  No trauma to suggest fracture.  Recommend ibuprofen, tylenol, heat and PCP follow up. Patient discharged in stable condition with understanding of reasons to return.   Final Clinical Impressions(s) / ED Diagnoses   Final diagnoses:  Strain of thoracic back region    ED Discharge Orders        Ordered    cyclobenzaprine (FLEXERIL) 10 MG tablet  2 times daily PRN     09/18/17 2102       Gareth Morgan, MD 09/19/17 1241

## 2017-09-18 NOTE — ED Triage Notes (Addendum)
Pt c/o lower right back pain which radiates to right lower abd pain, recent dx Bronchitis

## 2017-09-22 ENCOUNTER — Other Ambulatory Visit: Payer: Self-pay | Admitting: Physician Assistant

## 2017-09-22 DIAGNOSIS — K219 Gastro-esophageal reflux disease without esophagitis: Secondary | ICD-10-CM

## 2017-09-28 ENCOUNTER — Other Ambulatory Visit: Payer: Self-pay | Admitting: Physician Assistant

## 2017-09-28 DIAGNOSIS — I10 Essential (primary) hypertension: Secondary | ICD-10-CM

## 2017-10-01 ENCOUNTER — Other Ambulatory Visit: Payer: Self-pay | Admitting: Nurse Practitioner

## 2017-10-01 DIAGNOSIS — I10 Essential (primary) hypertension: Secondary | ICD-10-CM

## 2017-10-05 ENCOUNTER — Telehealth: Payer: Self-pay

## 2017-10-05 NOTE — Telephone Encounter (Signed)
Received a fax to request a refill for Fluticasone Prop 42mcg Spray.  Last filled was 07/01/2017.  Last Written: Dr. Adrian Blackwater; 07/11/2016

## 2017-10-08 ENCOUNTER — Other Ambulatory Visit: Payer: Self-pay | Admitting: Nurse Practitioner

## 2017-10-08 MED ORDER — FLUTICASONE PROPIONATE 50 MCG/ACT NA SUSP: 2.0000 | Freq: Every day | NASAL | 6 refills | Status: DC

## 2017-10-08 NOTE — Telephone Encounter (Signed)
Script has been sent.

## 2017-10-12 ENCOUNTER — Ambulatory Visit: Payer: No Typology Code available for payment source

## 2017-10-17 ENCOUNTER — Other Ambulatory Visit: Payer: Self-pay

## 2017-10-17 ENCOUNTER — Ambulatory Visit: Payer: No Typology Code available for payment source | Attending: Nurse Practitioner | Admitting: Physician Assistant

## 2017-10-17 VITALS — BP 127/87 | HR 93 | Temp 98.7°F | Resp 16 | Ht 61.0 in | Wt 206.0 lb

## 2017-10-17 DIAGNOSIS — E7800 Pure hypercholesterolemia, unspecified: Secondary | ICD-10-CM | POA: Insufficient documentation

## 2017-10-17 DIAGNOSIS — K219 Gastro-esophageal reflux disease without esophagitis: Secondary | ICD-10-CM | POA: Insufficient documentation

## 2017-10-17 DIAGNOSIS — I509 Heart failure, unspecified: Secondary | ICD-10-CM | POA: Insufficient documentation

## 2017-10-17 DIAGNOSIS — R079 Chest pain, unspecified: Secondary | ICD-10-CM

## 2017-10-17 DIAGNOSIS — E039 Hypothyroidism, unspecified: Secondary | ICD-10-CM | POA: Insufficient documentation

## 2017-10-17 DIAGNOSIS — E669 Obesity, unspecified: Secondary | ICD-10-CM | POA: Insufficient documentation

## 2017-10-17 DIAGNOSIS — I10 Essential (primary) hypertension: Secondary | ICD-10-CM

## 2017-10-17 DIAGNOSIS — E1121 Type 2 diabetes mellitus with diabetic nephropathy: Secondary | ICD-10-CM | POA: Diagnosis not present

## 2017-10-17 DIAGNOSIS — R0789 Other chest pain: Secondary | ICD-10-CM | POA: Insufficient documentation

## 2017-10-17 DIAGNOSIS — Z7984 Long term (current) use of oral hypoglycemic drugs: Secondary | ICD-10-CM | POA: Insufficient documentation

## 2017-10-17 DIAGNOSIS — I110 Hypertensive heart disease with heart failure: Secondary | ICD-10-CM | POA: Insufficient documentation

## 2017-10-17 DIAGNOSIS — Z7982 Long term (current) use of aspirin: Secondary | ICD-10-CM | POA: Insufficient documentation

## 2017-10-17 DIAGNOSIS — F411 Generalized anxiety disorder: Secondary | ICD-10-CM

## 2017-10-17 DIAGNOSIS — Z79899 Other long term (current) drug therapy: Secondary | ICD-10-CM | POA: Insufficient documentation

## 2017-10-17 DIAGNOSIS — Z886 Allergy status to analgesic agent status: Secondary | ICD-10-CM | POA: Insufficient documentation

## 2017-10-17 LAB — POCT GLYCOSYLATED HEMOGLOBIN (HGB A1C): HEMOGLOBIN A1C: 7.3 % — AB (ref 4.0–5.6)

## 2017-10-17 LAB — GLUCOSE, POCT (MANUAL RESULT ENTRY): POC Glucose: 151 mg/dl — AB (ref 70–99)

## 2017-10-17 MED ORDER — RANITIDINE HCL 300 MG PO TABS: 300.0000 mg | ORAL_TABLET | Freq: Every day | ORAL | 5 refills | Status: DC

## 2017-10-17 MED ORDER — FUROSEMIDE 20 MG PO TABS: 20.0000 mg | ORAL_TABLET | Freq: Two times a day (BID) | ORAL | 3 refills | Status: DC

## 2017-10-17 MED ORDER — FAMOTIDINE 20 MG PO TABS: 20.0000 mg | ORAL_TABLET | Freq: Two times a day (BID) | ORAL | 2 refills | Status: DC

## 2017-10-17 MED ORDER — LISINOPRIL 40 MG PO TABS: 40.0000 mg | ORAL_TABLET | Freq: Every day | ORAL | 3 refills | Status: DC

## 2017-10-17 MED ORDER — CITALOPRAM HYDROBROMIDE 20 MG PO TABS: 20.0000 mg | ORAL_TABLET | Freq: Every day | ORAL | 5 refills | Status: DC

## 2017-10-17 MED ORDER — LEVOTHYROXINE SODIUM 50 MCG PO TABS: 50.0000 ug | ORAL_TABLET | Freq: Every day | ORAL | 5 refills | Status: DC

## 2017-10-17 MED ORDER — CARVEDILOL 12.5 MG PO TABS: 12.5000 mg | ORAL_TABLET | Freq: Two times a day (BID) | ORAL | 5 refills | Status: DC

## 2017-10-17 MED ORDER — TRIAMCINOLONE ACETONIDE 0.1 % EX CREA: 1.0000 "application " | TOPICAL_CREAM | Freq: Two times a day (BID) | CUTANEOUS | 3 refills | Status: DC

## 2017-10-17 MED ORDER — ASPIRIN 81 MG PO TABS
81.0000 mg | ORAL_TABLET | Freq: Every day | ORAL | 6 refills | Status: DC
Start: 2017-10-17 — End: 2023-05-17

## 2017-10-17 MED ORDER — METFORMIN HCL 1000 MG PO TABS: 1000.0000 mg | ORAL_TABLET | Freq: Two times a day (BID) | ORAL | 3 refills | Status: DC

## 2017-10-17 NOTE — Progress Notes (Signed)
Patient ID: Linda Phelps, female   DOB: [birth year], 47 y.o.   MRN: [id number]

## 2017-10-17 NOTE — Progress Notes (Signed)
Pt. Asking for medication refill. Pt. Stated she need a referral for cardiologist due to she's been having a dull discomfort on her chest. Pt. Stated she have a rash on her neck, and would like a cream for it. Pt. Stated she would like to talk to provider regarding about another medication for cholesterol due to Statin medication makes her chest feels heavy.

## 2017-10-17 NOTE — Patient Instructions (Signed)
Diabetes Mellitus and Nutrition When you have diabetes (diabetes mellitus), it is very important to have healthy eating habits because your blood sugar (glucose) levels are greatly affected by what you eat and drink. Eating healthy foods in the appropriate amounts, at about the same times every day, can help you:  Control your blood glucose.  Lower your risk of heart disease.  Improve your blood pressure.  Reach or maintain a healthy weight.  Every person with diabetes is different, and each person has different needs for a meal plan. Your health care provider may recommend that you work with a diet and nutrition specialist (dietitian) to make a meal plan that is best for you. Your meal plan may vary depending on factors such as:  The calories you need.  The medicines you take.  Your weight.  Your blood glucose, blood pressure, and cholesterol levels.  Your activity level.  Other health conditions you have, such as heart or kidney disease.  How do carbohydrates affect me? Carbohydrates affect your blood glucose level more than any other type of food. Eating carbohydrates naturally increases the amount of glucose in your blood. Carbohydrate counting is a method for keeping track of how many carbohydrates you eat. Counting carbohydrates is important to keep your blood glucose at a healthy level, especially if you use insulin or take certain oral diabetes medicines. It is important to know how many carbohydrates you can safely have in each meal. This is different for every person. Your dietitian can help you calculate how many carbohydrates you should have at each meal and for snack. Foods that contain carbohydrates include:  Bread, cereal, rice, pasta, and crackers.  Potatoes and corn.  Peas, beans, and lentils.  Milk and yogurt.  Fruit and juice.  Desserts, such as cakes, cookies, ice cream, and candy.  How does alcohol affect me? Alcohol can cause a sudden decrease in blood  glucose (hypoglycemia), especially if you use insulin or take certain oral diabetes medicines. Hypoglycemia can be a life-threatening condition. Symptoms of hypoglycemia (sleepiness, dizziness, and confusion) are similar to symptoms of having too much alcohol. If your health care provider says that alcohol is safe for you, follow these guidelines:  Limit alcohol intake to no more than 1 drink per day for nonpregnant women and 2 drinks per day for men. One drink equals 12 oz of beer, 5 oz of wine, or 1 oz of hard liquor.  Do not drink on an empty stomach.  Keep yourself hydrated with water, diet soda, or unsweetened iced tea.  Keep in mind that regular soda, juice, and other mixers may contain a lot of sugar and must be counted as carbohydrates.  What are tips for following this plan? Reading food labels  Start by checking the serving size on the label. The amount of calories, carbohydrates, fats, and other nutrients listed on the label are based on one serving of the food. Many foods contain more than one serving per package.  Check the total grams (g) of carbohydrates in one serving. You can calculate the number of servings of carbohydrates in one serving by dividing the total carbohydrates by 15. For example, if a food has 30 g of total carbohydrates, it would be equal to 2 servings of carbohydrates.  Check the number of grams (g) of saturated and trans fats in one serving. Choose foods that have low or no amount of these fats.  Check the number of milligrams (mg) of sodium in one serving. Most people   should limit total sodium intake to less than 2,300 mg per day.  Always check the nutrition information of foods labeled as "low-fat" or "nonfat". These foods may be higher in added sugar or refined carbohydrates and should be avoided.  Talk to your dietitian to identify your daily goals for nutrients listed on the label. Shopping  Avoid buying canned, premade, or processed foods. These  foods tend to be high in fat, sodium, and added sugar.  Shop around the outside edge of the grocery store. This includes fresh fruits and vegetables, bulk grains, fresh meats, and fresh dairy. Cooking  Use low-heat cooking methods, such as baking, instead of high-heat cooking methods like deep frying.  Cook using healthy oils, such as olive, canola, or sunflower oil.  Avoid cooking with butter, cream, or high-fat meats. Meal planning  Eat meals and snacks regularly, preferably at the same times every day. Avoid going long periods of time without eating.  Eat foods high in fiber, such as fresh fruits, vegetables, beans, and whole grains. Talk to your dietitian about how many servings of carbohydrates you can eat at each meal.  Eat 4-6 ounces of lean protein each day, such as lean meat, chicken, fish, eggs, or tofu. 1 ounce is equal to 1 ounce of meat, chicken, or fish, 1 egg, or 1/4 cup of tofu.  Eat some foods each day that contain healthy fats, such as avocado, nuts, seeds, and fish. Lifestyle   Check your blood glucose regularly.  Exercise at least 30 minutes 5 or more days each week, or as told by your health care provider.  Take medicines as told by your health care provider.  Do not use any products that contain nicotine or tobacco, such as cigarettes and e-cigarettes. If you need help quitting, ask your health care provider.  Work with a counselor or diabetes educator to identify strategies to manage stress and any emotional and social challenges. What are some questions to ask my health care provider?  Do I need to meet with a diabetes educator?  Do I need to meet with a dietitian?  What number can I call if I have questions?  When are the best times to check my blood glucose? Where to find more information:  American Diabetes Association: diabetes.org/food-and-fitness/food  Academy of Nutrition and Dietetics:  www.eatright.org/resources/health/diseases-and-conditions/diabetes  National Institute of Diabetes and Digestive and Kidney Diseases (NIH): www.niddk.nih.gov/health-information/diabetes/overview/diet-eating-physical-activity Summary  A healthy meal plan will help you control your blood glucose and maintain a healthy lifestyle.  Working with a diet and nutrition specialist (dietitian) can help you make a meal plan that is best for you.  Keep in mind that carbohydrates and alcohol have immediate effects on your blood glucose levels. It is important to count carbohydrates and to use alcohol carefully. This information is not intended to replace advice given to you by your health care provider. Make sure you discuss any questions you have with your health care provider. Document Released: 02/02/2005 Document Revised: 06/12/2016 Document Reviewed: 06/12/2016 Elsevier Interactive Patient Education  2018 Elsevier Inc.  

## 2017-10-17 NOTE — Progress Notes (Signed)
Patient ID: Linda Phelps, female   DOB: 1970-08-13, 48 y.o.   MRN: [id number]     Linda Phelps, is a 47 y.o. female  FYB:[id number]  NID:[id number]  DOB - Sep 30, 1970  Subjective:  Chief Complaint and HPI: Linda Phelps is a 47 y.o. female here today for regular follow-up.  Having heaviness in her chest.  Sometimes pain in L breast and under both arms. Sometimes pain in L chest.  No chest pressure.  No SOB.  She feels like she has been having this daily since gaining weight over the last few months.  No paresthesias or jaw pain.  No FH early cardiac issues.    Taking 500mg  metformin morning and evening.  Doesn't check sugars.  Admits to poor diet.    Needs labs checked.  Compliant with meds.     ROS:   Constitutional:  No f/c, No night sweats, No unexplained weight loss. EENT:  No vision changes, No blurry vision, No hearing changes. No mouth, throat, or ear problems.  Respiratory: No cough, No SOB Cardiac: + CP, no palpitations GI:  No abd pain, No N/V/D. GU: No Urinary s/sx Musculoskeletal: No joint pain Neuro: No headache, no dizziness, no motor weakness.  Skin: No rash Endocrine:  No polydipsia. No polyuria.  Psych: Denies SI/HI  No problems updated.  ALLERGIES: Allergies  Allergen Reactions  . Ibuprofen Swelling    PAST MEDICAL HISTORY: Past Medical History:  Diagnosis Date  . CHF (congestive heart failure) ([place])   . Diabetes mellitus without complication ([place])   . Ectopic pregnancy   . High cholesterol   . Hypertension   . Low back pain   . Obesity   . Thyroid disease     MEDICATIONS AT HOME: Prior to Admission medications   Medication Sig Start Date End Date Taking? Authorizing Provider  acetaminophen (TYLENOL) 500 MG tablet Take 2 tablets (1,000 mg total) by mouth every 6 (six) hours as needed for mild pain or moderate pain. 01/08/17  Yes Hairston, Maylon Peppers, FNP  amLODipine (NORVASC) 10 MG tablet Take 1 tablet (10 mg total) by mouth daily. 05/09/17  Yes  Argentina Donovan, PA-C  aspirin 81 MG tablet Take 1 tablet (81 mg total) by mouth daily. 10/17/17  Yes Freeman Caldron M, PA-C  carvedilol (COREG) 12.5 MG tablet Take 1 tablet (12.5 mg total) by mouth 2 (two) times daily with a meal. 10/17/17  Yes McClung, Angela M, PA-C  cyclobenzaprine (FLEXERIL) 10 MG tablet Take 1 tablet (10 mg total) by mouth 2 (two) times daily as needed for muscle spasms. 09/18/17  Yes Gareth Morgan, MD  famotidine (PEPCID) 20 MG tablet Take 1 tablet (20 mg total) by mouth 2 (two) times daily. 10/17/17  Yes McClung, Angela M, PA-C  fluticasone (FLONASE) 50 MCG/ACT nasal spray Place 2 sprays into both nostrils daily. 10/08/17  Yes Gildardo Pounds, NP  furosemide (LASIX) 20 MG tablet Take 1 tablet (20 mg total) by mouth 2 (two) times daily. 10/17/17  Yes McClung, Dionne Bucy, PA-C  levothyroxine (SYNTHROID, LEVOTHROID) 50 MCG tablet Take 1 tablet (50 mcg total) by mouth daily. 10/17/17  Yes McClung, Angela M, PA-C  lisinopril (PRINIVIL,ZESTRIL) 40 MG tablet Take 1 tablet (40 mg total) by mouth daily. 10/17/17  Yes McClung, Dionne Bucy, PA-C  ondansetron (ZOFRAN ODT) 8 MG disintegrating tablet Take 1 tablet (8 mg total) by mouth every 8 (eight) hours as needed for nausea or vomiting. 07/20/17  Yes Molpus, Jenny Reichmann, MD  ranitidine (ZANTAC) 300  MG tablet Take 1 tablet (300 mg total) by mouth at bedtime. 10/17/17  Yes Freeman Caldron M, PA-C  citalopram (CELEXA) 20 MG tablet Take 1 tablet (20 mg total) by mouth daily. 10/17/17   Argentina Donovan, PA-C  metFORMIN (GLUCOPHAGE) 1000 MG tablet Take 1 tablet (1,000 mg total) by mouth 2 (two) times daily with a meal. 10/17/17   McClung, Dionne Bucy, PA-C  methocarbamol (ROBAXIN) 500 MG tablet Take 1 tablet (500 mg total) by mouth every 8 (eight) hours as needed for muscle spasms. Patient not taking: Reported on 10/17/2017 05/09/17   Argentina Donovan, PA-C     Objective:  EXAM:   Vitals:   10/17/17 1018  BP: 127/87  Pulse: 93  Resp: 16  Temp: 98.7  F (37.1 C)  TempSrc: Oral  SpO2: 98%  Weight: 206 lb (93.4 kg)  Height: 5\' 1"  (1.549 m)    General appearance : A&OX3. NAD. Non-toxic-appearing HEENT: Atraumatic and Normocephalic.  PERRLA. EOM intact.  TM clear B. Mouth-MMM, post pharynx WNL w/o erythema, No PND. Neck: supple, no JVD. No cervical lymphadenopathy. No thyromegaly Chest/Lungs:  Breathing-non-labored, Good air entry bilaterally, breath sounds normal without rales, rhonchi, or wheezing  CVS: S1 S2 regular, no murmurs, gallops, rubs  Extremities: Bilateral Lower Ext shows no edema, both legs are warm to touch with = pulse throughout Neurology:  CN II-XII grossly intact, Non focal.   Psych:  TP linear. J/I WNL. Normal speech. Appropriate eye contact and affect.  Skin:  No Rash  Data Review Lab Results  Component Value Date   HGBA1C 7.3 (A) 10/17/2017   HGBA1C 6.3 05/09/2017   HGBA1C 6.0 01/08/2017     Assessment & Plan   1. Chest pain, unspecified type No acute ischemia.  N ST changes.   - EKG 12-Lead - Ambulatory referral to Cardiology  2. 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  Dispense: 180 tablet; Refill: 3 - Comprehensive metabolic panel - Ambulatory referral to Cardiology  3. Essential hypertension controlled - lisinopril (PRINIVIL,ZESTRIL) 40 MG tablet; Take 1 tablet (40 mg total) by mouth daily.  Dispense: 90 tablet; Refill: 3 - furosemide (LASIX) 20 MG tablet; Take 1 tablet (20 mg total) by mouth 2 (two) times daily.  Dispense: 60 tablet; Refill: 3 - carvedilol (COREG) 12.5 MG tablet; Take 1 tablet (12.5 mg total) by mouth 2 (two) times daily with a meal.  Dispense: 60 tablet; Refill: 5 - aspirin 81 MG tablet; Take 1 tablet (81 mg total) by mouth daily.   Dispense: 90 tablet; Refill: 6 - Comprehensive metabolic panel - Ambulatory referral to Cardiology  4. Hypothyroidism, unspecified type Check labs today - levothyroxine (SYNTHROID, LEVOTHROID) 50 MCG tablet; Take 1 tablet (50 mcg total) by mouth daily.  Dispense: 30 tablet; Refill: 5 - TSH  5. Gastroesophageal reflux disease, esophagitis presence not specified - ranitidine (ZANTAC) 300 MG tablet; Take 1 tablet (300 mg total) by mouth at bedtime.  Dispense: 30 tablet; Refill: 5 - famotidine (PEPCID) 20 MG tablet; Take 1 tablet (20 mg total) by mouth 2 (two) times daily.  Dispense: 60 tablet; Refill: 2  6. Generalized anxiety disorder Stable- - citalopram (CELEXA) 20 MG tablet; Take 1 tablet (20 mg total) by mouth daily.  Dispense: 30 tablet; Refill: 5  Patient have been counseled extensively about nutrition and exercise  Return in about 3 months (around 01/17/2018) for Geryl Rankins; DM/htn/thyroid.  The patient was given clear instructions to go to ER or return to medical center if symptoms don't improve, worsen or new problems develop. The patient verbalized understanding. The patient was told to call to get lab results if they haven't heard anything in the next week.     Freeman Caldron, PA-C [facility] [place], [place]   10/17/2017, 10:58 AM

## 2017-10-18 LAB — COMPREHENSIVE METABOLIC PANEL
A/G RATIO: 1.4 (ref 1.2–2.2)
ALT: 18 IU/L (ref 0–32)
AST: 25 IU/L (ref 0–40)
Albumin: 4.1 g/dL (ref 3.5–5.5)
Alkaline Phosphatase: 61 IU/L (ref 39–117)
BILIRUBIN TOTAL: 0.4 mg/dL (ref 0.0–1.2)
BUN/Creatinine Ratio: 16 (ref 9–23)
BUN: 18 mg/dL (ref 6–24)
CALCIUM: 9.7 mg/dL (ref 8.7–10.2)
CHLORIDE: 103 mmol/L (ref 96–106)
CO2: 24 mmol/L (ref 20–29)
Creatinine, Ser: 1.12 mg/dL — ABNORMAL HIGH (ref 0.57–1.00)
GFR calc Af Amer: 67 mL/min/{1.73_m2} (ref >59)
GFR, EST NON AFRICAN AMERICAN: 58 mL/min/{1.73_m2} — AB (ref >59)
GLOBULIN, TOTAL: 3 g/dL (ref 1.5–4.5)
Glucose: 122 mg/dL — ABNORMAL HIGH (ref 65–99)
POTASSIUM: 4.1 mmol/L (ref 3.5–5.2)
SODIUM: 143 mmol/L (ref 134–144)
Total Protein: 7.1 g/dL (ref 6.0–8.5)

## 2017-10-18 LAB — TSH: TSH: 1.52 u[IU]/mL (ref 0.450–4.500)

## 2017-10-19 ENCOUNTER — Telehealth: Payer: Self-pay

## 2017-10-19 NOTE — Telephone Encounter (Signed)
CMA spoke to patient to inform on lab results.  Patient understood. Patient verified DOB.

## 2017-10-19 NOTE — Telephone Encounter (Signed)
-----   Message from Argentina Donovan, [place] sent at 10/18/2017  8:19 AM EDT ----- Please call patient.  Tell her to increase her water intake and work on diabetes as we discussed because her kidney function is mildly impaired.  This should improve with better diabetes control.  Her thyroid hormone is normal.  Follow up as planned.  Thanks, Freeman Caldron, PA-C

## 2017-10-26 ENCOUNTER — Telehealth: Payer: Self-pay | Admitting: Nurse Practitioner

## 2017-10-26 NOTE — Telephone Encounter (Signed)
Patient called requesting a refill on cyclobenzaprine (FLEXERIL) 10 MG tablet  Patient uses [facility],  Please f/u

## 2017-10-29 NOTE — Telephone Encounter (Signed)
CMA called patient to inform her that Flexeril was not prescribed by PCP. She stated she got it from the emergency department. Pt wanted to ask if PCP can refill it for her.

## 2017-10-31 ENCOUNTER — Other Ambulatory Visit: Payer: Self-pay | Admitting: Nurse Practitioner

## 2017-10-31 MED ORDER — CYCLOBENZAPRINE HCL 10 MG PO TABS: 10.0000 mg | ORAL_TABLET | Freq: Two times a day (BID) | ORAL | 0 refills | Status: DC | PRN

## 2017-10-31 NOTE — Telephone Encounter (Signed)
CMA called patient to inform her Rx sent. Patient Understood.

## 2017-10-31 NOTE — Telephone Encounter (Signed)
Script sent. Thank you.

## 2017-11-08 ENCOUNTER — Ambulatory Visit (INDEPENDENT_AMBULATORY_CARE_PROVIDER_SITE_OTHER): Payer: No Typology Code available for payment source | Admitting: Cardiology

## 2017-11-08 ENCOUNTER — Encounter: Payer: Self-pay | Admitting: Cardiology

## 2017-11-08 VITALS — BP 120/60 | HR 94 | Ht 62.0 in | Wt 207.8 lb

## 2017-11-08 DIAGNOSIS — R002 Palpitations: Secondary | ICD-10-CM

## 2017-11-08 DIAGNOSIS — F411 Generalized anxiety disorder: Secondary | ICD-10-CM

## 2017-11-08 DIAGNOSIS — I10 Essential (primary) hypertension: Secondary | ICD-10-CM

## 2017-11-08 DIAGNOSIS — E782 Mixed hyperlipidemia: Secondary | ICD-10-CM

## 2017-11-08 DIAGNOSIS — E1121 Type 2 diabetes mellitus with diabetic nephropathy: Secondary | ICD-10-CM

## 2017-11-08 NOTE — Patient Instructions (Signed)
Medication Instructions:  Your physician recommends that you continue on your current medications as directed. Please refer to the Current Medication list given to you today.   Labwork: None  Testing/Procedures: You had an EKG today.   Your physician has requested that you have an echocardiogram. Echocardiography is a painless test that uses sound waves to create images of your heart. It provides your doctor with information about the size and shape of your heart and how well your heart's chambers and valves are working. This procedure takes approximately one hour. There are no restrictions for this procedure.  Your physician has recommended that you wear a holter monitor. Holter monitors are medical devices that record the heart's electrical activity. Doctors most often use these monitors to diagnose arrhythmias. Arrhythmias are problems with the speed or rhythm of the heartbeat. The monitor is a small, portable device. You can wear one while you do your normal daily activities. This is usually used to diagnose what is causing palpitations/syncope (passing out). Wear for 7 days.   Follow-Up: Your physician recommends that you schedule a follow-up appointment in: 1 month.  If you need a refill on your cardiac medications before your next appointment, please call your pharmacy.   Thank you for choosing [facility]! Robyne Peers, RN [phone]

## 2017-11-08 NOTE — Progress Notes (Signed)
Cardiology Consultation:    Date:  11/08/2017   ID:  Linda Phelps, DOB 05/16/1971, MRN [id number]  PCP:  Gildardo Pounds, NP  Cardiologist:  Jenne Campus, MD   Referring MD: Gildardo Pounds, NP   Chief Complaint  Patient presents with  . Palpitations  I have palpitations  History of Present Illness:    Linda Phelps is a 47 y.o. female who is being seen today for the evaluation of palpitations at the request of Gildardo Pounds, NP.  She said 4 months she experienced palpitations what she described by that some skipped beats there is no sustained arrhythmia happens typically when she is trying to relax.  She does have some general anxiety disorder and she is very concerned and worried about palpitations.  No dizziness no passing out she does have some exertional shortness of breath.  She also has diabetes as well as obesity.  She is trying to lose weight and she walks on the regular basis with her sister in low they go 3 times a week for about half an hour.  They can continue conversation while walking.  There is no chest pain tightness squeezing pressure burning chest.  She described to have some swelling of lower extremities especially at evening time.  She said that years ago she seen cardiologist in [facility] and she was told to have congestive heart failure. He does have proximal nocturnal dyspnea.  Does snore somewhat.  Past Medical History:  Diagnosis Date  . CHF (congestive heart failure) ([place])   . Diabetes mellitus without complication ([place])   . Ectopic pregnancy   . High cholesterol   . Hypertension   . Low back pain   . Obesity   . Thyroid disease     Past Surgical History:  Procedure Laterality Date  . APPENDECTOMY    . CHOLECYSTECTOMY      Current Medications: Current Meds  Medication Sig  . acetaminophen (TYLENOL) 500 MG tablet Take 2 tablets (1,000 mg total) by mouth every 6 (six) hours as needed for mild pain or moderate pain.  Marland Kitchen amLODipine (NORVASC) 10  MG tablet Take 1 tablet (10 mg total) by mouth daily.  Marland Kitchen aspirin 81 MG tablet Take 1 tablet (81 mg total) by mouth daily.  . carvedilol (COREG) 12.5 MG tablet Take 1 tablet (12.5 mg total) by mouth 2 (two) times daily with a meal.  . cyclobenzaprine (FLEXERIL) 10 MG tablet Take 1 tablet (10 mg total) by mouth 2 (two) times daily as needed for muscle spasms.  . famotidine (PEPCID) 20 MG tablet Take 1 tablet (20 mg total) by mouth 2 (two) times daily.  . fluticasone (FLONASE) 50 MCG/ACT nasal spray Place 2 sprays into both nostrils daily.  . furosemide (LASIX) 20 MG tablet Take 1 tablet (20 mg total) by mouth 2 (two) times daily.  Marland Kitchen levothyroxine (SYNTHROID, LEVOTHROID) 50 MCG tablet Take 1 tablet (50 mcg total) by mouth daily.  Marland Kitchen lisinopril (PRINIVIL,ZESTRIL) 40 MG tablet Take 1 tablet (40 mg total) by mouth daily.  . metFORMIN (GLUCOPHAGE) 1000 MG tablet Take 1 tablet (1,000 mg total) by mouth 2 (two) times daily with a meal.  . ondansetron (ZOFRAN ODT) 8 MG disintegrating tablet Take 1 tablet (8 mg total) by mouth every 8 (eight) hours as needed for nausea or vomiting.  . ranitidine (ZANTAC) 300 MG tablet Take 1 tablet (300 mg total) by mouth at bedtime.  . triamcinolone cream (KENALOG) 0.1 % Apply 1 application topically  2 (two) times daily.     Allergies:   Ibuprofen   Social History   Socioeconomic History  . Marital status: Legally Separated    Spouse name: Not on file  . Number of children: Not on file  . Years of education: Not on file  . Highest education level: Not on file  Occupational History  . Not on file  Social Needs  . Financial resource strain: Not on file  . Food insecurity:    Worry: Not on file    Inability: Not on file  . Transportation needs:    Medical: Not on file    Non-medical: Not on file  Tobacco Use  . Smoking status: Never Smoker  . Smokeless tobacco: Never Used  Substance and Sexual Activity  . Alcohol use: Yes    Alcohol/week: 0.0 oz     Comment: occ  . Drug use: No  . Sexual activity: Yes    Birth control/protection: None  Lifestyle  . Physical activity:    Days per week: Not on file    Minutes per session: Not on file  . Stress: Not on file  Relationships  . Social connections:    Talks on phone: Not on file    Gets together: Not on file    Attends religious service: Not on file    Active member of club or organization: Not on file    Attends meetings of clubs or organizations: Not on file    Relationship status: Not on file  Other Topics Concern  . Not on file  Social History Narrative   She has a 62 you daughter   12 yo son in jail until 01/2017   Dating   Works as housekeeper     Family History: The patient's family history includes Colon cancer (age of onset: 26) in her maternal uncle; Diabetes in her maternal grandmother and mother. ROS:   Please see the history of present illness.    All 14 point review of systems negative except as described per history of present illness.  EKGs/Labs/Other Studies Reviewed:    The following studies were reviewed today: EKG from February of this year showed normal sinus rhythm normal P interval nonspecific intraventricular conduction delay    Recent Labs: 04/11/2017: B Natriuretic Peptide 7.2 07/18/2017: Hemoglobin 12.6; Platelets 275 10/17/2017: ALT 18; BUN 18; Creatinine, Ser 1.12; Potassium 4.1; Sodium 143; TSH 1.520  Recent Lipid Panel    Component Value Date/Time   CHOL 180 01/08/2017 1521   TRIG 186 (H) 01/08/2017 1521   HDL 57 01/08/2017 1521   CHOLHDL 3.2 01/08/2017 1521   LDLCALC 86 01/08/2017 1521    Physical Exam:    VS:  BP 120/60   Pulse 94   Ht 5\' 2"  (1.575 m)   Wt 207 lb 12.8 oz (94.3 kg)   LMP 10/08/2013   SpO2 97%   BMI 38.01 kg/m     Wt Readings from Last 3 Encounters:  11/08/17 207 lb 12.8 oz (94.3 kg)  10/17/17 206 lb (93.4 kg)  09/18/17 200 lb (90.7 kg)     GEN:  Well nourished, well developed in no acute distress HEENT:  Normal NECK: No JVD; No carotid bruits LYMPHATICS: No lymphadenopathy CARDIAC: RRR, no murmurs, no rubs, no gallops RESPIRATORY:  Clear to auscultation without rales, wheezing or rhonchi  ABDOMEN: Soft, non-tender, non-distended MUSCULOSKELETAL:  No edema; No deformity  SKIN: Warm and dry NEUROLOGIC:  Alert and oriented x 3 PSYCHIATRIC:  Normal affect  ASSESSMENT:    1. Palpitations   2. Essential hypertension   3. Mixed hyperlipidemia   4. Generalized anxiety disorder   5. Controlled type 2 diabetes mellitus with diabetic nephropathy, without long-term current use of insulin (HCC)    PLAN:    In order of problems listed above:  1. Palpitations: I will ask him to wear 7 days Holter monitor to see exactly what kind of arrhythmia with dealing with.  As a part of evaluation we will do echocardiogram to assess left ventricular ejection fraction.  I do not think there is any role for stress testing right now since she is able to walk with her sister quite brisk walk with no difficulties. 2. Essential hypertension: Pressure appears to be well controlled right now we will get echocardiogram to assess left ventricular ejection fraction and left ventricular hypertrophy. 3. Dyslipidemia: Previously she tried pravastatin however had some difficulty with it.  Will reconsider starting statin. 4. General anxiety disorder on some medication with some relief but still quite anxious. 5. Diabetes type 2.  On appropriate medications with good diet and exercises will be able to accomplish quite good control of this problem.  Her back in my office in about 1 month or sooner if she had a problem   Medication Adjustments/Labs and Tests Ordered: Current medicines are reviewed at length with the patient today.  Concerns regarding medicines are outlined above.  No orders of the defined types were placed in this encounter.  No orders of the defined types were placed in this encounter.   Signed, Park Liter, MD, [facility]. 11/08/2017 4:26 PM    [place]

## 2017-11-13 ENCOUNTER — Other Ambulatory Visit: Payer: Self-pay

## 2017-11-13 DIAGNOSIS — K219 Gastro-esophageal reflux disease without esophagitis: Secondary | ICD-10-CM

## 2017-11-13 MED ORDER — RANITIDINE HCL 300 MG PO TABS: 300.0000 mg | ORAL_TABLET | Freq: Every day | ORAL | 5 refills | Status: DC

## 2017-11-18 ENCOUNTER — Other Ambulatory Visit: Payer: Self-pay

## 2017-11-18 ENCOUNTER — Emergency Department (HOSPITAL_BASED_OUTPATIENT_CLINIC_OR_DEPARTMENT_OTHER): Payer: Self-pay

## 2017-11-18 ENCOUNTER — Encounter (HOSPITAL_BASED_OUTPATIENT_CLINIC_OR_DEPARTMENT_OTHER): Payer: Self-pay | Admitting: Emergency Medicine

## 2017-11-18 ENCOUNTER — Observation Stay (HOSPITAL_BASED_OUTPATIENT_CLINIC_OR_DEPARTMENT_OTHER)
Admission: EM | Admit: 2017-11-18 | Discharge: 2017-11-19 | Disposition: A | Discharge status: Home / Self Care | Payer: Self-pay | Attending: Internal Medicine | Admitting: Internal Medicine

## 2017-11-18 DIAGNOSIS — E039 Hypothyroidism, unspecified: Secondary | ICD-10-CM | POA: Insufficient documentation

## 2017-11-18 DIAGNOSIS — R778 Other specified abnormalities of plasma proteins: Secondary | ICD-10-CM

## 2017-11-18 DIAGNOSIS — I10 Essential (primary) hypertension: Secondary | ICD-10-CM | POA: Diagnosis present

## 2017-11-18 DIAGNOSIS — R079 Chest pain, unspecified: Secondary | ICD-10-CM | POA: Diagnosis present

## 2017-11-18 DIAGNOSIS — R748 Abnormal levels of other serum enzymes: Secondary | ICD-10-CM | POA: Insufficient documentation

## 2017-11-18 DIAGNOSIS — E785 Hyperlipidemia, unspecified: Secondary | ICD-10-CM | POA: Insufficient documentation

## 2017-11-18 DIAGNOSIS — R7989 Other specified abnormal findings of blood chemistry: Secondary | ICD-10-CM

## 2017-11-18 DIAGNOSIS — E1169 Type 2 diabetes mellitus with other specified complication: Secondary | ICD-10-CM

## 2017-11-18 DIAGNOSIS — R0789 Other chest pain: Principal | ICD-10-CM | POA: Insufficient documentation

## 2017-11-18 DIAGNOSIS — Z7984 Long term (current) use of oral hypoglycemic drugs: Secondary | ICD-10-CM | POA: Insufficient documentation

## 2017-11-18 DIAGNOSIS — E119 Type 2 diabetes mellitus without complications: Secondary | ICD-10-CM | POA: Insufficient documentation

## 2017-11-18 DIAGNOSIS — I110 Hypertensive heart disease with heart failure: Secondary | ICD-10-CM | POA: Insufficient documentation

## 2017-11-18 DIAGNOSIS — I509 Heart failure, unspecified: Secondary | ICD-10-CM | POA: Insufficient documentation

## 2017-11-18 DIAGNOSIS — Z79899 Other long term (current) drug therapy: Secondary | ICD-10-CM | POA: Insufficient documentation

## 2017-11-18 DIAGNOSIS — Z7982 Long term (current) use of aspirin: Secondary | ICD-10-CM | POA: Insufficient documentation

## 2017-11-18 DIAGNOSIS — E669 Obesity, unspecified: Secondary | ICD-10-CM

## 2017-11-18 HISTORY — DX: Other chest pain: R07.89

## 2017-11-18 LAB — COMPREHENSIVE METABOLIC PANEL
ALK PHOS: 60 U/L (ref 38–126)
ALT: 18 U/L (ref 0–44)
ANION GAP: 11 (ref 5–15)
AST: 24 U/L (ref 15–41)
Albumin: 3.7 g/dL (ref 3.5–5.0)
BILIRUBIN TOTAL: 0.5 mg/dL (ref 0.3–1.2)
BUN: 18 mg/dL (ref 6–20)
CO2: 29 mmol/L (ref 22–32)
CREATININE: 1.03 mg/dL — AB (ref 0.44–1.00)
Calcium: 8.7 mg/dL — ABNORMAL LOW (ref 8.9–10.3)
Chloride: 100 mmol/L (ref 98–111)
Glucose, Bld: 140 mg/dL — ABNORMAL HIGH (ref 70–99)
Potassium: 3.4 mmol/L — ABNORMAL LOW (ref 3.5–5.1)
SODIUM: 140 mmol/L (ref 135–145)
TOTAL PROTEIN: 7.5 g/dL (ref 6.5–8.1)

## 2017-11-18 LAB — TROPONIN I
TROPONIN I: 0.03 ng/mL — AB (ref <0.03)
TROPONIN I: 0.03 ng/mL — AB (ref <0.03)

## 2017-11-18 LAB — CBC
HEMATOCRIT: 39.8 % (ref 36.0–46.0)
HEMOGLOBIN: 12.8 g/dL (ref 12.0–15.0)
MCH: 29.4 pg (ref 26.0–34.0)
MCHC: 32.2 g/dL (ref 30.0–36.0)
MCV: 91.3 fL (ref 78.0–100.0)
Platelets: 317 10*3/uL (ref 150–400)
RBC: 4.36 MIL/uL (ref 3.87–5.11)
RDW: 12.5 % (ref 11.5–15.5)
WBC: 7.3 10*3/uL (ref 4.0–10.5)

## 2017-11-18 LAB — CBC WITH DIFFERENTIAL/PLATELET
BASOS ABS: 0 10*3/uL (ref 0.0–0.1)
Basophils Relative: 1 %
EOS ABS: 0.3 10*3/uL (ref 0.0–0.7)
Eosinophils Relative: 4 %
HEMATOCRIT: 38.3 % (ref 36.0–46.0)
HEMOGLOBIN: 12.9 g/dL (ref 12.0–15.0)
Lymphocytes Relative: 49 %
Lymphs Abs: 3.9 10*3/uL (ref 0.7–4.0)
MCH: 30.4 pg (ref 26.0–34.0)
MCHC: 33.7 g/dL (ref 30.0–36.0)
MCV: 90.1 fL (ref 78.0–100.0)
MONOS PCT: 12 %
Monocytes Absolute: 0.9 10*3/uL (ref 0.1–1.0)
NEUTROS ABS: 2.9 10*3/uL (ref 1.7–7.7)
Neutrophils Relative %: 36 %
Platelets: 313 10*3/uL (ref 150–400)
RBC: 4.25 MIL/uL (ref 3.87–5.11)
RDW: 12.7 % (ref 11.5–15.5)
WBC: 8.1 10*3/uL (ref 4.0–10.5)

## 2017-11-18 LAB — CREATININE, SERUM
CREATININE: 1.18 mg/dL — AB (ref 0.44–1.00)
GFR calc Af Amer: >60 mL/min (ref >60)
GFR, EST NON AFRICAN AMERICAN: 53 mL/min — AB (ref >60)

## 2017-11-18 LAB — LIPASE, BLOOD: LIPASE: 45 U/L (ref 11–51)

## 2017-11-18 LAB — GLUCOSE, CAPILLARY: Glucose-Capillary: 121 mg/dL — ABNORMAL HIGH (ref 70–99)

## 2017-11-18 MED ORDER — CARVEDILOL 12.5 MG PO TABS
12.5000 mg | ORAL_TABLET | Freq: Two times a day (BID) | ORAL | Status: DC
  Administered 2017-11-18 – 2017-11-19 (×2): 12.5 mg via ORAL
  Filled 2017-11-18 (×2): qty 1

## 2017-11-18 MED ORDER — ASPIRIN 325 MG PO TABS
325.0000 mg | ORAL_TABLET | Freq: Once | ORAL | Status: AC
  Administered 2017-11-18: 325 mg via ORAL
  Filled 2017-11-18: qty 1

## 2017-11-18 MED ORDER — LEVOTHYROXINE SODIUM 50 MCG PO TABS
50.0000 ug | ORAL_TABLET | Freq: Every day | ORAL | Status: DC
  Administered 2017-11-19: 50 ug via ORAL
  Filled 2017-11-18: qty 1

## 2017-11-18 MED ORDER — INSULIN ASPART 100 UNIT/ML ~~LOC~~ SOLN: 0.0000 [IU] | Freq: Three times a day (TID) | SUBCUTANEOUS | Status: DC

## 2017-11-18 MED ORDER — ENOXAPARIN SODIUM 40 MG/0.4ML ~~LOC~~ SOLN
40.0000 mg | SUBCUTANEOUS | Status: DC
  Administered 2017-11-18: 40 mg via SUBCUTANEOUS
  Filled 2017-11-18: qty 0.4

## 2017-11-18 MED ORDER — LISINOPRIL 40 MG PO TABS
40.0000 mg | ORAL_TABLET | Freq: Every day | ORAL | Status: DC
  Administered 2017-11-19: 40 mg via ORAL
  Filled 2017-11-18: qty 1

## 2017-11-18 MED ORDER — FAMOTIDINE 20 MG PO TABS
20.0000 mg | ORAL_TABLET | Freq: Two times a day (BID) | ORAL | Status: DC
Start: 2017-11-18 — End: 2017-11-19
  Administered 2017-11-18 – 2017-11-19 (×2): 20 mg via ORAL
  Filled 2017-11-18 (×2): qty 1

## 2017-11-18 MED ORDER — LORAZEPAM 0.5 MG PO TABS
0.5000 mg | ORAL_TABLET | Freq: Once | ORAL | Status: AC
  Administered 2017-11-18: 0.5 mg via ORAL
  Filled 2017-11-18: qty 1

## 2017-11-18 MED ORDER — ACETAMINOPHEN 325 MG PO TABS
650.0000 mg | ORAL_TABLET | ORAL | Status: DC | PRN
  Administered 2017-11-18: 650 mg via ORAL
  Filled 2017-11-18: qty 2

## 2017-11-18 MED ORDER — MORPHINE SULFATE (PF) 2 MG/ML IV SOLN: 2.0000 mg | INTRAVENOUS | Status: DC | PRN

## 2017-11-18 MED ORDER — GI COCKTAIL ~~LOC~~
30.0000 mL | Freq: Once | ORAL | Status: AC
  Administered 2017-11-18: 30 mL via ORAL
  Filled 2017-11-18: qty 30

## 2017-11-18 MED ORDER — AMLODIPINE BESYLATE 10 MG PO TABS
10.0000 mg | ORAL_TABLET | Freq: Every day | ORAL | Status: DC
  Administered 2017-11-18 – 2017-11-19 (×2): 10 mg via ORAL
  Filled 2017-11-18 (×2): qty 1

## 2017-11-18 MED ORDER — NITROGLYCERIN 0.4 MG SL SUBL: 0.4000 mg | SUBLINGUAL_TABLET | SUBLINGUAL | Status: DC | PRN

## 2017-11-18 MED ORDER — HYDRALAZINE HCL 20 MG/ML IJ SOLN
10.0000 mg | INTRAMUSCULAR | Status: DC | PRN
  Administered 2017-11-18: 10 mg via INTRAVENOUS
  Filled 2017-11-18: qty 1

## 2017-11-18 MED ORDER — ONDANSETRON HCL 4 MG/2ML IJ SOLN: 4.0000 mg | Freq: Four times a day (QID) | INTRAMUSCULAR | Status: DC | PRN

## 2017-11-18 NOTE — H&P (Signed)
History and Physical    Linda Phelps MOQ:[id number] DOB: [birth year] DOA: 11/18/2017  PCP: Gildardo Pounds, NP  Patient coming from: Home.  Chief Complaint: Chest pain.  HPI: Linda Phelps is a 47 y.o. female with history of postpartum cardiomyopathy last EF measured in May 2017 was showing EF of 55 to 60% with grade 1 diastolic dysfunction, hypertension, diabetes mellitus presents to the ER with complaints of chest pain.  Patient started developing chest pain retrosternal burning sensation radiating to her neck felt like acid reflux after she had some noodles last evening.  She took some Tylenol and went from bed and woke up this morning with similar symptoms and went to the ER admits in [facility]  Denies any nausea vomiting abdominal pain shortness of breath diaphoresis or any change in recent medications.  ED Course: In the ER patient chest pain improved with GI cocktail.  Troponin is mildly elevated EKG shows normal sinus rhythm with LVH.  Patient at this time is still having some burning sensation in the lower part of the sternum.  Admitted for further cardiac work-up.  Review of Systems: As per HPI, rest all negative.   Past Medical History:  Diagnosis Date  . CHF (congestive heart failure) ([place])   . Diabetes mellitus without complication ([place])   . Ectopic pregnancy   . High cholesterol   . Hypertension   . Low back pain   . Obesity   . Thyroid disease     Past Surgical History:  Procedure Laterality Date  . APPENDECTOMY    . CHOLECYSTECTOMY       reports that she has never smoked. She has never used smokeless tobacco. She reports that she drinks alcohol. She reports that she does not use drugs.  Allergies  Allergen Reactions  . Ibuprofen Swelling    Family History  Problem Relation Age of Onset  . Diabetes Mother   . Diabetes Maternal Grandmother   . Colon cancer Maternal Uncle 65    Prior to Admission medications   Medication Sig Start Date End Date Taking?  Authorizing Provider  acetaminophen (TYLENOL) 500 MG tablet Take 2 tablets (1,000 mg total) by mouth every 6 (six) hours as needed for mild pain or moderate pain. 01/08/17   Alfonse Spruce, FNP  amLODipine (NORVASC) 10 MG tablet Take 1 tablet (10 mg total) by mouth daily. 05/09/17   Argentina Donovan, PA-C  aspirin 81 MG tablet Take 1 tablet (81 mg total) by mouth daily. 10/17/17   Argentina Donovan, PA-C  carvedilol (COREG) 12.5 MG tablet Take 1 tablet (12.5 mg total) by mouth 2 (two) times daily with a meal. 10/17/17   McClung, Dionne Bucy, PA-C  cyclobenzaprine (FLEXERIL) 10 MG tablet Take 1 tablet (10 mg total) by mouth 2 (two) times daily as needed for muscle spasms. 10/31/17   Gildardo Pounds, NP  famotidine (PEPCID) 20 MG tablet Take 1 tablet (20 mg total) by mouth 2 (two) times daily. 10/17/17   Argentina Donovan, PA-C  fluticasone (FLONASE) 50 MCG/ACT nasal spray Place 2 sprays into both nostrils daily. 10/08/17   Gildardo Pounds, NP  furosemide (LASIX) 20 MG tablet Take 1 tablet (20 mg total) by mouth 2 (two) times daily. 10/17/17   Argentina Donovan, PA-C  levothyroxine (SYNTHROID, LEVOTHROID) 50 MCG tablet Take 1 tablet (50 mcg total) by mouth daily. 10/17/17   Argentina Donovan, PA-C  lisinopril (PRINIVIL,ZESTRIL) 40 MG tablet Take 1 tablet (40 mg total)  by mouth daily. 10/17/17   Argentina Donovan, PA-C  metFORMIN (GLUCOPHAGE) 1000 MG tablet Take 1 tablet (1,000 mg total) by mouth 2 (two) times daily with a meal. 10/17/17   McClung, Dionne Bucy, PA-C  ondansetron (ZOFRAN ODT) 8 MG disintegrating tablet Take 1 tablet (8 mg total) by mouth every 8 (eight) hours as needed for nausea or vomiting. 07/20/17   Molpus, John, MD  ranitidine (ZANTAC) 300 MG tablet Take 1 tablet (300 mg total) by mouth at bedtime. 11/13/17   Gildardo Pounds, NP  triamcinolone cream (KENALOG) 0.1 % Apply 1 application topically 2 (two) times daily. 10/17/17   Argentina Donovan, PA-C    Physical Exam: Vitals:   11/18/17  1730 11/18/17 1800 11/18/17 1904 11/18/17 2118  BP: (!) 166/101 (!) 179/96 (!) 195/96 (!) 191/121  Pulse: 87 94 77 (!) 103  Resp: 15 19    Temp:   99.2 F (37.3 C) 98.9 F (37.2 C)  TempSrc:   Oral Oral  SpO2: 97% 97% 96% 98%  Weight:      Height:          Constitutional: Moderately built and nourished. Vitals:   11/18/17 1730 11/18/17 1800 11/18/17 1904 11/18/17 2118  BP: (!) 166/101 (!) 179/96 (!) 195/96 (!) 191/121  Pulse: 87 94 77 (!) 103  Resp: 15 19    Temp:   99.2 F (37.3 C) 98.9 F (37.2 C)  TempSrc:   Oral Oral  SpO2: 97% 97% 96% 98%  Weight:      Height:       Eyes: Anicteric no pallor. ENMT: No discharge from the ears eyes nose or mouth. Neck: No mass palpated no JVD appreciated. Respiratory: No rhonchi or crepitations. Cardiovascular: S1-S2 heard no murmurs appreciated. Abdomen: Soft nontender bowel sounds present. Musculoskeletal: No edema.  No joint effusion. Skin: No rash. Neurologic: Alert awake oriented to time place and person.  Moves all extremities. Psychiatric: Appears normal per normal affect.   Labs on Admission: I have personally reviewed following labs and imaging studies  CBC: Recent Labs  Lab 11/18/17 1528  WBC 8.1  NEUTROABS 2.9  HGB 12.9  HCT 38.3  MCV 90.1  PLT 427   Basic Metabolic Panel: Recent Labs  Lab 11/18/17 1528  NA 140  K 3.4*  CL 100  CO2 29  GLUCOSE 140*  BUN 18  CREATININE 1.03*  CALCIUM 8.7*   GFR: Estimated Creatinine Clearance: 70.5 mL/min (A) (by C-G formula based on SCr of 1.03 mg/dL (H)). Liver Function Tests: Recent Labs  Lab 11/18/17 1528  AST 24  ALT 18  ALKPHOS 60  BILITOT 0.5  PROT 7.5  ALBUMIN 3.7   Recent Labs  Lab 11/18/17 1528  LIPASE 45   No results for input(s): AMMONIA in the last 168 hours. Coagulation Profile: No results for input(s): INR, PROTIME in the last 168 hours. Cardiac Enzymes: Recent Labs  Lab 11/18/17 1528  TROPONINI 0.03*   BNP (last 3 results) No  results for input(s): PROBNP in the last 8760 hours. HbA1C: No results for input(s): HGBA1C in the last 72 hours. CBG: No results for input(s): GLUCAP in the last 168 hours. Lipid Profile: No results for input(s): CHOL, HDL, LDLCALC, TRIG, CHOLHDL, LDLDIRECT in the last 72 hours. Thyroid Function Tests: No results for input(s): TSH, T4TOTAL, FREET4, T3FREE, THYROIDAB in the last 72 hours. Anemia Panel: No results for input(s): VITAMINB12, FOLATE, FERRITIN, TIBC, IRON, RETICCTPCT in the last 72 hours. Urine analysis:  Component Value Date/Time   COLORURINE YELLOW 09/18/2017 1910   APPEARANCEUR CLEAR 09/18/2017 1910   LABSPEC >1.030 (H) 09/18/2017 1910   PHURINE 6.0 09/18/2017 1910   GLUCOSEU NEGATIVE 09/18/2017 1910   HGBUR TRACE (A) 09/18/2017 1910   BILIRUBINUR NEGATIVE 09/18/2017 1910   KETONESUR 15 (A) 09/18/2017 1910   PROTEINUR >300 (A) 09/18/2017 1910   UROBILINOGEN 1.0 09/05/2011 1005   NITRITE NEGATIVE 09/18/2017 1910   LEUKOCYTESUR NEGATIVE 09/18/2017 1910   Sepsis Labs: @LABRCNTIP (procalcitonin:4,lacticidven:4) )No results found for this or any previous visit (from the past 240 hour(s)).   Radiological Exams on Admission: Dg Chest 2 View  Result Date: 11/18/2017 CLINICAL DATA:  Mid epigastric pain. EXAM: CHEST - 2 VIEW COMPARISON:  Oct 09, 2017 FINDINGS: The heart size and mediastinal contours are within normal limits. Both lungs are clear. The visualized skeletal structures are unremarkable. IMPRESSION: No active cardiopulmonary disease. Electronically Signed   By: Dorise Bullion III M.D   On: 11/18/2017 16:55    EKG: Independently reviewed.  Normal sinus rhythm with LVH and left atrial enlargement.  Assessment/Plan Principal Problem:   Chest pain Active Problems:   Diabetes mellitus type 2 in obese [facility])   Essential hypertension   Hyperlipidemia   Hypothyroidism   Atypical chest pain    1. Chest pain with positive troponin concerning for ACS -we will  cycle cardiac markers check 2D echo PRN nitroglycerin cardiology consult.  Will place patient on statins and continue patient's home dose of Coreg.  Since patient symptoms also mildly atypical and appears GI will keep patient on PPI. 2. Hypertensive urgency -in addition to home dose of lisinopril Coreg Norvasc I have placed patient on PRN IV hydralazine.  Uncontrolled blood pressure probably contributing to patient's symptoms. 3. Hypothyroidism on Synthroid. 4. Diabetes mellitus type 2 we will keep patient on sliding scale coverage and hold metformin while inpatient. 5. History of postpartum cardiomyopathy last EF measured in 2017 showed EF of 55 to 60% with grade 1 diastolic dysfunction on Lasix which will be continued.  Appears to be compensated.   DVT prophylaxis: Lovenox. Code Status: Full code. Family Communication: Discussed with patient. Disposition Plan: Home. Consults called: Cardiology. Admission status: Observation.   Rise Patience MD [facility] Pager [phone].  If 7PM-7AM, please contact night-coverage [email or web address] Password [email or web address]  11/18/2017, 9:21 PM

## 2017-11-18 NOTE — Plan of Care (Signed)
  Problem: Education: Goal: Knowledge of General Education information will improve Outcome: Completed/Met   Problem: Activity: Goal: Risk for activity intolerance will decrease Outcome: Completed/Met

## 2017-11-18 NOTE — ED Notes (Signed)
Troponin 0.03, results given to ED MD 

## 2017-11-18 NOTE — ED Provider Notes (Signed)
[facility] EMERGENCY DEPARTMENT Provider Note   CSN: [id number] Arrival date & time: 11/18/17  1455     History   Chief Complaint Chief Complaint  Patient presents with  . Chest Pain    HPI Chariah Bailey is a 47 y.o. female.  Patient is a 47 year old female with a history of diabetes, hypertension and hyperlipidemia who presents with reflux.  She states that she has a history of reflux but has been feeling worse since last night.  She states that she ate some [place] food last night and since that time she has had an increase in her reflux symptoms.  She has a funny taste in her mouth and some burning in her epigastrium that radiates to the center of her chest.  She has no other chest pain.  No fevers.  She has a little nausea but no vomiting.  She is status post cholecystectomy.  She denies any change in bowel habits.  She has no exertional symptoms.  Is been fairly constant since yesterday.  She takes Zantac and Pepcid and has not noted any improvement in symptoms with these medications.  She states it gets worse after she tries to eat.  She states it feels similar to her prior reflux symptoms but typically it gets better with her medications.     Past Medical History:  Diagnosis Date  . CHF (congestive heart failure) ([place])   . Diabetes mellitus without complication ([place])   . Ectopic pregnancy   . High cholesterol   . Hypertension   . Low back pain   . Obesity   . Thyroid disease     Patient Active Problem List   Diagnosis Date Noted  . Chronic right-sided low back pain without sciatica 07/11/2016  . Acute non intractable tension-type headache 05/18/2016  . Abnormal chest x-ray 05/18/2016  . Vitamin D insufficiency 01/13/2016  . Menstrual cycle problem 01/11/2016  . Hordeolum externum (stye) 01/11/2016  . Obesity (BMI 30.0-34.9) 10/01/2015  . Palpitations 07/26/2015  . GERD (gastroesophageal reflux disease) 07/26/2015  . Generalized anxiety disorder  07/26/2015  . Poor dentition 04/13/2015  . Cardiomegaly 09/23/2014  . Diabetes type 2, controlled ([place]) 07/03/2014  . Essential hypertension 07/03/2014  . Hyperlipidemia 07/03/2014  . Hypothyroidism 07/03/2014    Past Surgical History:  Procedure Laterality Date  . APPENDECTOMY    . CHOLECYSTECTOMY       OB History   None      Home Medications    Prior to Admission medications   Medication Sig Start Date End Date Taking? Authorizing Provider  acetaminophen (TYLENOL) 500 MG tablet Take 2 tablets (1,000 mg total) by mouth every 6 (six) hours as needed for mild pain or moderate pain. 01/08/17   Alfonse Spruce, FNP  amLODipine (NORVASC) 10 MG tablet Take 1 tablet (10 mg total) by mouth daily. 05/09/17   Argentina Donovan, PA-C  aspirin 81 MG tablet Take 1 tablet (81 mg total) by mouth daily. 10/17/17   Argentina Donovan, PA-C  carvedilol (COREG) 12.5 MG tablet Take 1 tablet (12.5 mg total) by mouth 2 (two) times daily with a meal. 10/17/17   McClung, Dionne Bucy, PA-C  cyclobenzaprine (FLEXERIL) 10 MG tablet Take 1 tablet (10 mg total) by mouth 2 (two) times daily as needed for muscle spasms. 10/31/17   Gildardo Pounds, NP  famotidine (PEPCID) 20 MG tablet Take 1 tablet (20 mg total) by mouth 2 (two) times daily. 10/17/17   Argentina Donovan, PA-C  fluticasone (FLONASE) 50 MCG/ACT nasal spray Place 2 sprays into both nostrils daily. 10/08/17   Gildardo Pounds, NP  furosemide (LASIX) 20 MG tablet Take 1 tablet (20 mg total) by mouth 2 (two) times daily. 10/17/17   Argentina Donovan, PA-C  levothyroxine (SYNTHROID, LEVOTHROID) 50 MCG tablet Take 1 tablet (50 mcg total) by mouth daily. 10/17/17   Argentina Donovan, PA-C  lisinopril (PRINIVIL,ZESTRIL) 40 MG tablet Take 1 tablet (40 mg total) by mouth daily. 10/17/17   Argentina Donovan, PA-C  metFORMIN (GLUCOPHAGE) 1000 MG tablet Take 1 tablet (1,000 mg total) by mouth 2 (two) times daily with a meal. 10/17/17   McClung, Dionne Bucy, PA-C    ondansetron (ZOFRAN ODT) 8 MG disintegrating tablet Take 1 tablet (8 mg total) by mouth every 8 (eight) hours as needed for nausea or vomiting. 07/20/17   Molpus, John, MD  ranitidine (ZANTAC) 300 MG tablet Take 1 tablet (300 mg total) by mouth at bedtime. 11/13/17   Gildardo Pounds, NP  triamcinolone cream (KENALOG) 0.1 % Apply 1 application topically 2 (two) times daily. 10/17/17   Argentina Donovan, PA-C    Family History Family History  Problem Relation Age of Onset  . Diabetes Mother   . Diabetes Maternal Grandmother   . Colon cancer Maternal Uncle 11    Social History Social History   Tobacco Use  . Smoking status: Never Smoker  . Smokeless tobacco: Never Used  Substance Use Topics  . Alcohol use: Yes    Alcohol/week: 0.0 oz    Comment: occ  . Drug use: No     Allergies   Ibuprofen   Review of Systems Review of Systems  Constitutional: Negative for chills, diaphoresis, fatigue and fever.  HENT: Negative for congestion, rhinorrhea and sneezing.   Eyes: Negative.   Respiratory: Negative for cough, chest tightness and shortness of breath.   Cardiovascular: Positive for chest pain. Negative for leg swelling.  Gastrointestinal: Positive for abdominal pain and nausea. Negative for blood in stool, diarrhea and vomiting.  Genitourinary: Negative for difficulty urinating, flank pain, frequency and hematuria.  Musculoskeletal: Negative for arthralgias and back pain.  Skin: Negative for rash.  Neurological: Negative for dizziness, speech difficulty, weakness, numbness and headaches.     Physical Exam Updated Vital Signs BP (!) 152/95   Pulse 88   Temp 98.3 F (36.8 C) (Oral)   Resp 17   Ht 5\' 2"  (1.575 m)   Wt 93.9 kg (207 lb)   LMP 10/08/2013   SpO2 99%   BMI 37.86 kg/m   Physical Exam  Constitutional: She is oriented to person, place, and time. She appears well-developed and well-nourished.  HENT:  Head: Normocephalic and atraumatic.  Eyes: Pupils are  equal, round, and reactive to light.  Neck: Normal range of motion. Neck supple.  Cardiovascular: Normal rate, regular rhythm and normal heart sounds.  Pulmonary/Chest: Effort normal and breath sounds normal. No respiratory distress. She has no wheezes. She has no rales. She exhibits no tenderness.  Abdominal: Soft. Bowel sounds are normal. There is tenderness. There is no rebound and no guarding.  Mild tenderness to the epigastrium  Musculoskeletal: Normal range of motion. She exhibits no edema.  Lymphadenopathy:    She has no cervical adenopathy.  Neurological: She is alert and oriented to person, place, and time.  Skin: Skin is warm and dry. No rash noted.  Psychiatric: She has a normal mood and affect.     ED Treatments /  Results  Labs (all labs ordered are listed, but only abnormal results are displayed) Labs Reviewed  COMPREHENSIVE METABOLIC PANEL - Abnormal; Notable for the following components:      Result Value   Potassium 3.4 (*)    Glucose, Bld 140 (*)    Creatinine, Ser 1.03 (*)    Calcium 8.7 (*)    All other components within normal limits  TROPONIN I - Abnormal; Notable for the following components:   Troponin I 0.03 (*)    All other components within normal limits  CBC WITH DIFFERENTIAL/PLATELET  LIPASE, BLOOD    EKG EKG Interpretation  Date/Time:  Sunday November 18 2017 15:03:16 EDT Ventricular Rate:  94 PR Interval:  174 QRS Duration: 94 QT Interval:  354 QTC Calculation: 442 R Axis:   -8 Text Interpretation:  Normal sinus rhythm Possible Left atrial enlargement Left ventricular hypertrophy Abnormal ECG since last tracing no significant change Confirmed by Malvin Johns [id number]) on 11/18/2017 3:15:09 PM   Radiology Dg Chest 2 View  Result Date: 11/18/2017 CLINICAL DATA:  Mid epigastric pain. EXAM: CHEST - 2 VIEW COMPARISON:  Oct 09, 2017 FINDINGS: The heart size and mediastinal contours are within normal limits. Both lungs are clear. The visualized  skeletal structures are unremarkable. IMPRESSION: No active cardiopulmonary disease. Electronically Signed   By: Dorise Bullion III M.D   On: 11/18/2017 16:55    Procedures Procedures (including critical care time)  Medications Ordered in ED Medications  gi cocktail (Maalox,Lidocaine,Donnatal) (30 mLs Oral Given 11/18/17 1530)     Initial Impression / Assessment and Plan / ED Course  I have reviewed the triage vital signs and the nursing notes.  Pertinent labs & imaging results that were available during my care of the patient were reviewed by me and considered in my medical decision making (see chart for details).     Patient is a 47 year old female who presents with atypical chest pain.  She has no associated symptoms other than nausea.  Her EKG does not show ischemic changes.  Her troponin is minimally elevated and given her risk factors, I felt that she needs to be admitted for further evaluation.  I spoke with Dr. Lonny Prude at [facility] who is excepted the patient for transfer.  She is currently pain-free.  Final Clinical Impressions(s) / ED Diagnoses   Final diagnoses:  Atypical chest pain  Elevated troponin    ED Discharge Orders    None       Malvin Johns, MD 11/18/17 1719

## 2017-11-18 NOTE — ED Triage Notes (Signed)
Patient states that she started to have pain and burning to her mid epigastric region to her chest last night  - she reports a hx of acid reflux " but I aint never had it this bad"

## 2017-11-19 ENCOUNTER — Other Ambulatory Visit: Payer: Self-pay | Admitting: Medical

## 2017-11-19 ENCOUNTER — Telehealth: Payer: Self-pay

## 2017-11-19 ENCOUNTER — Observation Stay (HOSPITAL_BASED_OUTPATIENT_CLINIC_OR_DEPARTMENT_OTHER): Payer: Self-pay

## 2017-11-19 DIAGNOSIS — R079 Chest pain, unspecified: Secondary | ICD-10-CM

## 2017-11-19 DIAGNOSIS — R0789 Other chest pain: Secondary | ICD-10-CM

## 2017-11-19 DIAGNOSIS — I10 Essential (primary) hypertension: Secondary | ICD-10-CM

## 2017-11-19 DIAGNOSIS — I509 Heart failure, unspecified: Secondary | ICD-10-CM

## 2017-11-19 DIAGNOSIS — E669 Obesity, unspecified: Secondary | ICD-10-CM

## 2017-11-19 DIAGNOSIS — E1169 Type 2 diabetes mellitus with other specified complication: Secondary | ICD-10-CM

## 2017-11-19 DIAGNOSIS — E782 Mixed hyperlipidemia: Secondary | ICD-10-CM

## 2017-11-19 LAB — GLUCOSE, CAPILLARY
GLUCOSE-CAPILLARY: 118 mg/dL — AB (ref 70–99)
GLUCOSE-CAPILLARY: 120 mg/dL — AB (ref 70–99)

## 2017-11-19 LAB — HIV ANTIBODY (ROUTINE TESTING W REFLEX): HIV Screen 4th Generation wRfx: NONREACTIVE

## 2017-11-19 LAB — TROPONIN I
Troponin I: 0.04 ng/mL (ref <0.03)
Troponin I: 0.03 ng/mL (ref <0.03)

## 2017-11-19 LAB — ECHOCARDIOGRAM COMPLETE
HEIGHTINCHES: 62 in
Weight: 3272 oz

## 2017-11-19 MED ORDER — CLONAZEPAM 0.5 MG PO TABS: 0.5000 mg | ORAL_TABLET | Freq: Two times a day (BID) | ORAL | 0 refills | Status: DC | PRN

## 2017-11-19 NOTE — Telephone Encounter (Signed)
Call received from Jacqlyn Krauss, RN CM requesting a hospital follow up appointment for the patient at [facility]   An appointment was scheduled for 11/29/17 @ 0850 and the information was placed on the AVS

## 2017-11-19 NOTE — Discharge Instructions (Signed)
° °  You have a Stress Test scheduled at [facility]. Your doctor has ordered this test to check the blood flow in your heart arteries. ° °Please arrive 15 minutes early for paperwork. The whole test will take several hours. You may want to bring reading material to remain occupied while undergoing different parts of the test. ° °Instructions: °· No food/drink after midnight the night before. °· It is OK to take your morning meds with a sip of water EXCEPT for those types of medicines listed below or otherwise instructed. °· No caffeine/decaf products 24 hours before, including medicines such as Excedrin or Goody Powders. Call if there are any questions.  °· Wear comfortable clothes and shoes.  ° °Special Medication Instructions: °· Beta blockers such as metoprolol (Lopressor/Toprol XL), atenolol (Tenormin), carvedilol (Coreg), nebivolol (Bystolic), bisoprolol (Zebeta), propranolol (Inderal) should not be taken for 24 hours before the test. °· Calcium channel blockers such as diltiazem (Cardizem) or verapmil (Calan) should not be taken for 24 hours before the test. °· Remove nitroglycerin patches and do not take nitrate preparations such as Imdur/isosorbide the day of your test. °· No Persantine/Theophylline or Aggrenox medicines should be used within 24 hours of the test.  °· If you are diabetic, please ask which medications to hold the day of the test ° °What To Expect: °When you arrive in the lab, the technician will inject a small amount of radioactive tracer into your arm through an IV while you are resting quietly. This helps us to form pictures of your heart. You will likely only feel a sting from the IV. After a waiting period, resting pictures will be obtained under a big camera. These are the "before" pictures. ° °Next, you will be prepped for the stress portion of the test. This may include either walking on a treadmill or receiving a medicine that helps to dilate blood vessels in  your heart to simulate the effect of exercise on your heart. If you are walking on a treadmill, you will walk at different paces to try to get your heart rate to a goal number that is based on your age. If your doctor has chosen the pharmacologic test, then you will receive a medicine through your IV that may cause temporary nausea, flushing, shortness of breath and sometimes chest discomfort or vomiting. This is typically short-lived and usually resolves quickly. If you experience symptoms, that does not automatically mean the test is abnormal. Some patients do not experience any symptoms at all. Your blood pressure and heart rate will be monitored, and we will be watching your EKG on a computer screen for any changes. During this portion of the test, the radiologist will inject another small amount of radioactive tracer into your IV. After a waiting period, you will undergo a second set of pictures. These are the "after" pictures. ° °The doctor reading the test will compare the before-and-after images to look for evidence of heart blockages or heart weakness. The test usually takes 1 day to complete, but in certain instances (for example, if a patient is over a certain weight limit), the test may be done over the span of 2 days. ° ° °

## 2017-11-19 NOTE — Progress Notes (Signed)
Echocardiogram 2D Echocardiogram has been performed.  Linda Phelps 11/19/2017, 11:24 AM

## 2017-11-19 NOTE — Care Management Note (Signed)
Case Management Note  Patient Details  Name: Linda Phelps MRN: [id number] Date of Birth: 09-14-1970  Subjective/Objective:  Pt presented for Chest Pain. Pt is established at the [facility] for PCP Gildardo Pounds, NP.  CM did call to schedule a hospital follow up. Difficulty getting through to Schedule an appointment. CM did call Opal Sidles with [facility] and she is trying to get an appointment time. Appointment will be placed on AVS.                   Action/Plan: Pt will be able to utilize the [facility] and medications will range in cost from $4.00-$10.00. No further needs from CM at this time.   Expected Discharge Date:  11/19/17               Expected Discharge Plan:  Home/Self Care  In-House Referral:  NA  Discharge planning Services  CM Consult, Follow-up appt scheduled, [facility], Medication Assistance  Post Acute Care Choice:  NA Choice offered to:  NA  DME Arranged:  N/A DME Agency:  NA  HH Arranged:  NA HH Agency:  NA  Status of Service:  Completed, signed off  If discussed at [place] of Stay Meetings, dates discussed:    Additional Comments:  Bethena Roys, RN 11/19/2017, 12:10 PM

## 2017-11-19 NOTE — Discharge Summary (Signed)
Discharge Summary  Linda Phelps YKZ:[id number] DOB: 08-10-1970  PCP: Gildardo Pounds, NP  Admit date: 11/18/2017 Discharge date: 11/19/2017  Time spent: 83mins  Recommendations for Outpatient Follow-up:  1. F/u with PMD within a week  for hospital discharge follow up, repeat cbc/bmp at follow up 2. F/u with cardiology  Discharge Diagnoses:  Active Hospital Problems   Diagnosis Date Noted  . Chest pain 11/18/2017  . Atypical chest pain 11/18/2017  . Hypothyroidism 07/03/2014  . Hyperlipidemia 07/03/2014  . Essential hypertension 07/03/2014  . Diabetes mellitus type 2 in obese [facility]) 07/03/2014    Resolved Hospital Problems  No resolved problems to display.    Discharge Condition: stable  Diet recommendation: heart healthy/carb modified  Filed Weights   11/18/17 1501 11/19/17 0426  Weight: 93.9 kg (207 lb) 92.8 kg (204 lb 8 oz)    History of present illness: (per admitting MD Dr Hal Hope) PCP: Gildardo Pounds, NP  Patient coming from: Home.  Chief Complaint: Chest pain.  HPI: Linda Phelps is a 47 y.o. female with history of postpartum cardiomyopathy last EF measured in May 2017 was showing EF of 55 to 60% with grade 1 diastolic dysfunction, hypertension, diabetes mellitus presents to the ER with complaints of chest pain.  Patient started developing chest pain retrosternal burning sensation radiating to her neck felt like acid reflux after she had some noodles last evening.  She took some Tylenol and went from bed and woke up this morning with similar symptoms and went to the ER admits in [facility].  Denies any nausea vomiting abdominal pain shortness of breath diaphoresis or any change in recent medications.  ED Course: In the ER patient chest pain improved with GI cocktail.  Troponin is mildly elevated EKG shows normal sinus rhythm with LVH.  Patient at this time is still having some burning sensation in the lower part of the sternum.  Admitted for further cardiac  work-up.    Hospital Course:  Principal Problem:   Chest pain Active Problems:   Diabetes mellitus type 2 in obese [facility])   Essential hypertension   Hyperlipidemia   Hypothyroidism   Atypical chest pain  Chest pain , likely from GERD and anxiety, chest pain resolved with gi cocktail. but patient does has significant cardiac risk factors. EKG no acute changes, Troponin mild and flat at Echo lvef wnl, wall motion was normal. Cardiology consulted recommended outpatient stress test.  Palpitations: cardiology is to set up outpatient cardiac monitor  Chronic diastolic chf: History of postpartum cardiomyopathy last EF measured in 2017 showed EF of 55 to 60% with grade 1 diastolic dysfunction on Lasix which will be continued.  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  Procedures:  none  Consultations:  cardiology  Discharge Exam: BP 129/76 (BP Location: Left Arm)   Pulse 85   Temp 98.9 F (37.2 C) (Oral)   Resp 19   Ht 5\' 2"  (1.575 m)   Wt 92.8 kg (204 lb 8 oz)   LMP 10/08/2013   SpO2 97%   BMI 37.40 kg/m   General: NAD Cardiovascular: RRR Respiratory: CTABL  Discharge Instructions You were cared for by a hospitalist during your hospital stay. If you have any  questions about your discharge medications or the care you received while you were in the hospital after you are discharged, you can call the unit and asked to speak with the hospitalist on call if the hospitalist that took care of you is not available. Once you are discharged, your primary care physician will handle any further medical issues. Please note that NO REFILLS for any discharge medications will be authorized once you  are discharged, as it is imperative that you return to your primary care physician (or establish a relationship with a primary care physician if you do not have one) for your aftercare needs so that they can reassess your need for medications and monitor your lab values.  Discharge Instructions    Diet - low sodium heart healthy   Complete by:  As directed    Carb modified diet   Increase activity slowly   Complete by:  As directed      Allergies as of 11/19/2017      Reactions   Ibuprofen Swelling      Medication List    STOP taking these medications   ranitidine 300 MG tablet Commonly known as:  ZANTAC     TAKE these medications   acetaminophen 500 MG tablet Commonly known as:  TYLENOL Take 2 tablets (1,000 mg total) by mouth every 6 (six) hours as needed for mild pain or moderate pain.   amLODipine 10 MG tablet Commonly known as:  NORVASC Take 1 tablet (10 mg total) by mouth daily.   aspirin 81 MG tablet Take 1 tablet (81 mg total) by mouth daily.   carvedilol 12.5 MG tablet Commonly known as:  COREG Take 1 tablet (12.5 mg total) by mouth 2 (two) times daily with a meal.   clonazePAM 0.5 MG tablet Commonly known as:  KLONOPIN Take 1 tablet (0.5 mg total) by mouth 2 (two) times daily as needed for anxiety.   cyclobenzaprine 10 MG tablet Commonly known as:  FLEXERIL Take 1 tablet (10 mg total) by mouth 2 (two) times daily as needed for muscle spasms.   famotidine 20 MG tablet Commonly known as:  PEPCID Take 1 tablet (20 mg total) by mouth 2 (two) times daily.   fluticasone 50 MCG/ACT nasal spray Commonly known as:  FLONASE Place 2 sprays into both nostrils daily.   furosemide 20 MG tablet Commonly known as:  LASIX Take 1 tablet (20 mg total) by mouth 2 (two) times daily.   levothyroxine 50 MCG tablet Commonly known as:  SYNTHROID, LEVOTHROID Take 1 tablet (50 mcg total) by mouth daily.   lisinopril 40 MG tablet Commonly known as:  PRINIVIL,ZESTRIL Take 1  tablet (40 mg total) by mouth daily.   metFORMIN 1000 MG tablet Commonly known as:  GLUCOPHAGE Take 1 tablet (1,000 mg total) by mouth 2 (two) times daily with a meal.   ondansetron 8 MG disintegrating tablet Commonly known as:  ZOFRAN ODT Take 1 tablet (8 mg total) by mouth every 8 (eight) hours as needed for nausea or vomiting.   triamcinolone cream 0.1 % Commonly known as:  KENALOG Apply 1 application topically 2 (two) times daily.      Allergies  Allergen Reactions  . Ibuprofen Swelling   Follow-up Information    [facility] Follow up on 11/27/2017.   Specialty:  Cardiology Why:  Please arrive 15 minutes early for your 1:00pm stress test appointment. Please refer to your discharge papers for instructions. Nothing to eat/drink after midnight the night before  your test.  Contact information: [address] [place] [phone]       Dr. Agustin Cree Follow up on 12/05/2017.   Why:  Please arrive 15 minutes early for your 10:40 am appoitnment. You will discuss your stress test results in detail at this visit.  Contact information: [facility] [place] [id number] [place] Follow up on 11/29/2017.   Why:  at 8:50am for a hospital follow up appointment. Contact information: [address]  [place] [address] [phone]           The results of significant diagnostics from this hospitalization (including imaging, microbiology, ancillary and laboratory) are listed below for reference.    Significant Diagnostic Studies: Dg Chest 2 View  Result Date: 11/18/2017 CLINICAL DATA:  Mid epigastric pain. EXAM: CHEST - 2 VIEW COMPARISON:  Oct 09, 2017 FINDINGS: The heart size and mediastinal contours are within normal limits. Both lungs are clear. The visualized skeletal structures are unremarkable.  IMPRESSION: No active cardiopulmonary disease. Electronically Signed   By: Dorise Bullion III M.D   On: 11/18/2017 16:55    Microbiology: No results found for this or any previous visit (from the past 240 hour(s)).   Labs: Basic Metabolic Panel: Recent Labs  Lab 11/18/17 1528 11/18/17 2132  NA 140  --   K 3.4*  --   CL 100  --   CO2 29  --   GLUCOSE 140*  --   BUN 18  --   CREATININE 1.03* 1.18*  CALCIUM 8.7*  --    Liver Function Tests: Recent Labs  Lab 11/18/17 1528  AST 24  ALT 18  ALKPHOS 60  BILITOT 0.5  PROT 7.5  ALBUMIN 3.7   Recent Labs  Lab 11/18/17 1528  LIPASE 45   No results for input(s): AMMONIA in the last 168 hours. CBC: Recent Labs  Lab 11/18/17 1528 11/18/17 2132  WBC 8.1 7.3  NEUTROABS 2.9  --   HGB 12.9 12.8  HCT 38.3 39.8  MCV 90.1 91.3  PLT 313 317   Cardiac Enzymes: Recent Labs  Lab 11/18/17 1528 11/18/17 2132 11/19/17 0416 11/19/17 0823  TROPONINI 0.03* 0.03* 0.04* 0.03*   BNP: BNP (last 3 results) Recent Labs    04/11/17 1831  BNP 7.2    ProBNP (last 3 results) No results for input(s): PROBNP in the last 8760 hours.  CBG: Recent Labs  Lab 11/18/17 2118 11/19/17 0721 11/19/17 1133  GLUCAP 121* 118* 120*       Signed:  Florencia Reasons MD, PhD  [facility] 11/19/2017, 6:20 PM

## 2017-11-19 NOTE — Consult Note (Addendum)
Cardiology Consultation:   Patient ID: Linda Phelps; [id number]; January 25, 1971   Admit date: 11/18/2017 Date of Consult: 11/19/2017  Primary Care Provider: Gildardo Pounds, NP Primary Cardiologist: Jenne Campus, MD Primary Electrophysiologist:  None   Patient Profile:   Linda Phelps is a 47 y.o. female with a PMH of peripartum cardiomyopathy, HTN, HLD, DM type 2, hypothyroidism, anxiety, and obesity who is being seen today for the evaluation of chest pain at the request of Dr. Erlinda Hong.  History of Present Illness:   Ms. Wirtanen was in her usual state of health until the evening of 11/17/17 when she began experiencing substernal chest pressure which she states is consistent with her reflux. She states her symptoms began approximately 2 hours after eating. She denied associated symptoms. She took some tylenol and was able to sleep normally. She reports being awoken from sleep on the morning of 11/18/17 with sharp chest pain under her right breast which lasted for 1-2 minutes and resolved spontaneously. She reports this again felt similar to prior reflux symptoms and again was without associated symptoms. Later in the day, she reports feeling diaphoretic in the absence of chest pain, which is what prompted her to present to the ED for evaluation because she does not typically sweat.   She was seen for initial evaluation by Dr. Agustin Cree 11/08/17 with complaints of palpitations which occurred mostly at rest. She was without CP or SOB complaints at that time and reported walking briskly 3x per week without anginal complaints. She had intermittent LE edema and PND complaints as well. She was recommended for a 7d event monitor which was scheduled to begin 11/28/17 and to undergo an echocardiogram which had not been completed yet. Ischemic testing was deferred at that time given ability to exercise without angina. She reports undergoing a stress test 5-10 years ago which was normal. Risk factors for heart disease  include HTN, HLD, DM type 2, and obesity.  She denies having issues with chest pain in the past. She reports a longstanding history of reflux for which she takes medications. Her symptoms resolved after receiving a GI cocktail in the ED. She has not had reoccurrence. She is without anginal complaints and is able to walk briskly without CP or SOB. She reports chronic intermittent LE edema for which she takes po lasix BID and prn if worsening symptoms. She denies orthopnea or PND.   Hospital course: BP labile (max 191/121, low 108/81), intermittently mildly tachycardic, otherwise VSS. Labs notable for K 3.4, Cr 1.03>1.18, CBC wnl, Trop 0.03>0.03>0.04. EKG with sinus rhythm with probably LAE, LVH, no STE/D, no TWI, no significant change from previous. CXR without acute findings. Patient was admitted to medicine. Echo pending.  Cardiology consulted for further recommendations regarding chest pain.   Past Medical History:  Diagnosis Date  . CHF (congestive heart failure) ([place])   . Diabetes mellitus without complication ([place])   . Ectopic pregnancy   . High cholesterol   . Hypertension   . Low back pain   . Obesity   . Thyroid disease     Past Surgical History:  Procedure Laterality Date  . APPENDECTOMY    . CHOLECYSTECTOMY       Home Medications:  Prior to Admission medications   Medication Sig Start Date End Date Taking? Authorizing Provider  acetaminophen (TYLENOL) 500 MG tablet Take 2 tablets (1,000 mg total) by mouth every 6 (six) hours as needed for mild pain or moderate pain. 01/08/17   Alfonse Spruce, FNP  amLODipine (NORVASC) 10 MG tablet Take 1 tablet (10 mg total) by mouth daily. 05/09/17   Argentina Donovan, PA-C  aspirin 81 MG tablet Take 1 tablet (81 mg total) by mouth daily. 10/17/17   Argentina Donovan, PA-C  carvedilol (COREG) 12.5 MG tablet Take 1 tablet (12.5 mg total) by mouth 2 (two) times daily with a meal. 10/17/17   McClung, Dionne Bucy, PA-C  cyclobenzaprine  (FLEXERIL) 10 MG tablet Take 1 tablet (10 mg total) by mouth 2 (two) times daily as needed for muscle spasms. 10/31/17   Gildardo Pounds, NP  famotidine (PEPCID) 20 MG tablet Take 1 tablet (20 mg total) by mouth 2 (two) times daily. 10/17/17   Argentina Donovan, PA-C  fluticasone (FLONASE) 50 MCG/ACT nasal spray Place 2 sprays into both nostrils daily. 10/08/17   Gildardo Pounds, NP  furosemide (LASIX) 20 MG tablet Take 1 tablet (20 mg total) by mouth 2 (two) times daily. 10/17/17   Argentina Donovan, PA-C  levothyroxine (SYNTHROID, LEVOTHROID) 50 MCG tablet Take 1 tablet (50 mcg total) by mouth daily. 10/17/17   Argentina Donovan, PA-C  lisinopril (PRINIVIL,ZESTRIL) 40 MG tablet Take 1 tablet (40 mg total) by mouth daily. 10/17/17   Argentina Donovan, PA-C  metFORMIN (GLUCOPHAGE) 1000 MG tablet Take 1 tablet (1,000 mg total) by mouth 2 (two) times daily with a meal. 10/17/17   McClung, Dionne Bucy, PA-C  ondansetron (ZOFRAN ODT) 8 MG disintegrating tablet Take 1 tablet (8 mg total) by mouth every 8 (eight) hours as needed for nausea or vomiting. 07/20/17   Molpus, John, MD  ranitidine (ZANTAC) 300 MG tablet Take 1 tablet (300 mg total) by mouth at bedtime. 11/13/17   Gildardo Pounds, NP  triamcinolone cream (KENALOG) 0.1 % Apply 1 application topically 2 (two) times daily. 10/17/17   Argentina Donovan, PA-C    Inpatient Medications: Scheduled Meds: . amLODipine  10 mg Oral Daily  . carvedilol  12.5 mg Oral BID WC  . enoxaparin (LOVENOX) injection  40 mg Subcutaneous Q24H  . famotidine  20 mg Oral BID  . insulin aspart  0-9 Units Subcutaneous TID WC  . levothyroxine  50 mcg Oral QAC breakfast  . lisinopril  40 mg Oral Daily   Continuous Infusions:  PRN Meds: acetaminophen, hydrALAZINE, morphine injection, nitroGLYCERIN, ondansetron (ZOFRAN) IV  Allergies:    Allergies  Allergen Reactions  . Ibuprofen Swelling    Social History:   Social History   Socioeconomic History  . Marital status:  Legally Separated    Spouse name: Not on file  . Number of children: Not on file  . Years of education: Not on file  . Highest education level: Not on file  Occupational History  . Not on file  Social Needs  . Financial resource strain: Not on file  . Food insecurity:    Worry: Not on file    Inability: Not on file  . Transportation needs:    Medical: Not on file    Non-medical: Not on file  Tobacco Use  . Smoking status: Never Smoker  . Smokeless tobacco: Never Used  Substance and Sexual Activity  . Alcohol use: Yes    Alcohol/week: 0.0 oz    Comment: occ  . Drug use: No  . Sexual activity: Yes    Birth control/protection: None  Lifestyle  . Physical activity:    Days per week: Not on file    Minutes per session: Not on file  .  Stress: Not on file  Relationships  . Social connections:    Talks on phone: Not on file    Gets together: Not on file    Attends religious service: Not on file    Active member of club or organization: Not on file    Attends meetings of clubs or organizations: Not on file    Relationship status: Not on file  . Intimate partner violence:    Fear of current or ex partner: Not on file    Emotionally abused: Not on file    Physically abused: Not on file    Forced sexual activity: Not on file  Other Topics Concern  . Not on file  Social History Narrative   She has a 26 you daughter   32 yo son in jail until 01/2017   Dating   Works as housekeeper    Family History:    Family History  Problem Relation Age of Onset  . Diabetes Mother   . Diabetes Maternal Grandmother   . Colon cancer Maternal Uncle 65     ROS:  Please see the history of present illness.   All other ROS reviewed and negative.     Physical Exam/Data:   Vitals:   11/18/17 2118 11/19/17 0000 11/19/17 0426 11/19/17 0724  BP: (!) 191/121 108/81 136/76 (!) 112/51  Pulse: (!) 103 100 81 79  Resp:      Temp: 98.9 F (37.2 C) 98.9 F (37.2 C) 98 F (36.7 C) 98.2 F  (36.8 C)  TempSrc: Oral Oral Oral Oral  SpO2: 98% 95% 100% 97%  Weight:   204 lb 8 oz (92.8 kg)   Height:   5\' 2"  (1.575 m)    No intake or output data in the 24 hours ending 11/19/17 0804 Filed Weights   11/18/17 1501 11/19/17 0426  Weight: 207 lb (93.9 kg) 204 lb 8 oz (92.8 kg)   Body mass index is 37.4 kg/m.  General:  Well nourished, well developed, obese AAF in no acute distress HEENT: sclera anicteric  Neck: no JVD Vascular: No carotid bruits; distal pulses 2+ bilaterally Cardiac:  normal S1, S2; RRR; no murmurs, gallops, or rubs Lungs:  clear to auscultation bilaterally, no wheezing, rhonchi or rales  Abd: NABS, soft, obese, nontender, no hepatomegaly Ext: no edema Musculoskeletal:  No deformities, BUE and BLE strength normal and equal Skin: warm and dry  Neuro:  CNs 2-12 intact, no focal abnormalities noted Psych:  Normal affect   EKG:  The EKG was personally reviewed and demonstrates:  sinus rhythm with probably LAE, LVH, no STE/D, no TWI, no significant change from previous. Telemetry:  Telemetry was personally reviewed and demonstrates:  NSR with brief episodes of sinus tachycardia and rare PVCs.  Relevant CV Studies: Echocardiogram 09/2015: Study Conclusions  - Left ventricle: The cavity size was normal. Wall thickness was   increased in a pattern of mild LVH. Systolic function was normal.   The estimated ejection fraction was in the range of 55% to 60%.   Wall motion was normal; there were no regional wall motion   abnormalities. Doppler parameters are consistent with abnormal   left ventricular relaxation (grade 1 diastolic dysfunction). LV   filling pressure is indeterminate. - Left atrium: The atrium was normal in size. - Inferior vena cava: The vessel was normal in size. The   respirophasic diameter changes were in the normal range (>= 50%),   consistent with normal central venous pressure.  Impressions:  -  LVEF 55-60%, mild LVH, normal wall motion,  diastolic dystunction,   indeterminate LV filling pressure, normal LA Size, normal IVC.  Laboratory Data:  Chemistry Recent Labs  Lab 11/18/17 1528 11/18/17 2132  NA 140  --   K 3.4*  --   CL 100  --   CO2 29  --   GLUCOSE 140*  --   BUN 18  --   CREATININE 1.03* 1.18*  CALCIUM 8.7*  --   GFRNONAA >60 53*  GFRAA >60 >60  ANIONGAP 11  --     Recent Labs  Lab 11/18/17 1528  PROT 7.5  ALBUMIN 3.7  AST 24  ALT 18  ALKPHOS 60  BILITOT 0.5   Hematology Recent Labs  Lab 11/18/17 1528 11/18/17 2132  WBC 8.1 7.3  RBC 4.25 4.36  HGB 12.9 12.8  HCT 38.3 39.8  MCV 90.1 91.3  MCH 30.4 29.4  MCHC 33.7 32.2  RDW 12.7 12.5  PLT 313 317   Cardiac Enzymes Recent Labs  Lab 11/18/17 1528 11/18/17 2132 11/19/17 0416  TROPONINI 0.03* 0.03* 0.04*   No results for input(s): TROPIPOC in the last 168 hours.  BNPNo results for input(s): BNP, PROBNP in the last 168 hours.  DDimer No results for input(s): DDIMER in the last 168 hours.  Radiology/Studies:  Dg Chest 2 View  Result Date: 11/18/2017 CLINICAL DATA:  Mid epigastric pain. EXAM: CHEST - 2 VIEW COMPARISON:  Oct 09, 2017 FINDINGS: The heart size and mediastinal contours are within normal limits. Both lungs are clear. The visualized skeletal structures are unremarkable. IMPRESSION: No active cardiopulmonary disease. Electronically Signed   By: Dorise Bullion III M.D   On: 11/18/2017 16:55    Assessment and Plan:   1. Chest pain: patient presented with substernal burning chest pain radiating to the neck shortly after eating dinner on the evening of 11/17/17. Pain ultimately relieved with GI cocktail. Trop trend 0.03>0.03>0.04. EKG without ischemic changes. She was also found to have hypertensive urgency on presentation requiring prn hydralazine. Atypical presentation less concerning for ACS, more likely GI source. Mild trop elevation not consistent with ACS. Risk factors for heart disease include HTN, HLD, DM type 2, and  obesity.  - Echo pending - Plan for outpatient exercise stress myoview.   2. Hypertensive Urgency: BP significantly elevated on presentation, improved with prn IV hydralazine and home medications. Appeared to be well controlled outpatient.  - Continue home medications  3. HLD: Last LDL 86 12/2016. Reports intolerance to pravastatin in the past - Could consider alternative low dose statin vs referral to lipid clinic for PSK-9 inhibitor. Will defer to Dr. Agustin Cree  4. DM type 2: Last A1C 7.3 09/2017; goal <7 - Continue management per primary team  5. Palpitations: reported outpatient as intermittent, occurring primarily at rest - Scheduled for outpatient 7d event monitor 11/28/17     For questions or updates, please contact [place] Please consult [email or web address] for contact info under Cardiology/STEMI.   Signed, Abigail Butts, PA-C  11/19/2017 8:04 AM [phone]  I have seen and examined the patient along with Abigail Butts, PA-C .  I have reviewed the chart, notes and new data.  I agree with PA/NP's note.  Key new complaints: Currently asymptomatic.  Chest pain was atypical on presentation and per the patient's own report consistent with previous symptoms of gastroesophageal reflux. Key examination changes: Obesity, otherwise normal cardiovascular examination Key new findings / data: No ischemic changes on ECG.  Trivial abnormality in  1 of 3 troponin levels (0.04)  PLAN: Echocardiogram is being performed this morning, but barring any surprising abnormalities on that study, she is appropriate for outpatient work-up.  She is already scheduled for a event monitor to be hooked up on July 10.  I would also recommend a plain treadmill stress test for risk stratification, since she does have numerous coronary risk factors and is recently postmenopausal.  This can be performed as an outpatient.  Sanda Klein, MD, [place] [phone] 11/19/2017, 9:40 AM

## 2017-11-21 ENCOUNTER — Telehealth (HOSPITAL_COMMUNITY): Payer: Self-pay

## 2017-11-21 NOTE — Telephone Encounter (Signed)
Encounter complete. 

## 2017-11-23 ENCOUNTER — Other Ambulatory Visit: Payer: Self-pay | Admitting: Medical

## 2017-11-27 ENCOUNTER — Ambulatory Visit (HOSPITAL_COMMUNITY): Admit: 2017-11-27 | Payer: Self-pay | Attending: Medical | Admitting: Medical

## 2017-11-28 ENCOUNTER — Other Ambulatory Visit (HOSPITAL_BASED_OUTPATIENT_CLINIC_OR_DEPARTMENT_OTHER): Payer: Self-pay

## 2017-11-29 ENCOUNTER — Ambulatory Visit: Payer: Self-pay | Attending: Family Medicine | Admitting: Physician Assistant

## 2017-11-29 VITALS — BP 145/85 | HR 82 | Temp 98.4°F | Resp 18 | Ht 61.0 in | Wt 206.0 lb

## 2017-11-29 DIAGNOSIS — K219 Gastro-esophageal reflux disease without esophagitis: Secondary | ICD-10-CM

## 2017-11-29 DIAGNOSIS — R002 Palpitations: Secondary | ICD-10-CM | POA: Insufficient documentation

## 2017-11-29 DIAGNOSIS — I130 Hypertensive heart and chronic kidney disease with heart failure and stage 1 through stage 4 chronic kidney disease, or unspecified chronic kidney disease: Secondary | ICD-10-CM | POA: Insufficient documentation

## 2017-11-29 DIAGNOSIS — I10 Essential (primary) hypertension: Secondary | ICD-10-CM

## 2017-11-29 DIAGNOSIS — Z09 Encounter for follow-up examination after completed treatment for conditions other than malignant neoplasm: Secondary | ICD-10-CM

## 2017-11-29 DIAGNOSIS — E669 Obesity, unspecified: Secondary | ICD-10-CM

## 2017-11-29 DIAGNOSIS — Z79899 Other long term (current) drug therapy: Secondary | ICD-10-CM | POA: Insufficient documentation

## 2017-11-29 DIAGNOSIS — E7800 Pure hypercholesterolemia, unspecified: Secondary | ICD-10-CM | POA: Insufficient documentation

## 2017-11-29 DIAGNOSIS — R079 Chest pain, unspecified: Secondary | ICD-10-CM

## 2017-11-29 DIAGNOSIS — I5032 Chronic diastolic (congestive) heart failure: Secondary | ICD-10-CM | POA: Insufficient documentation

## 2017-11-29 DIAGNOSIS — E876 Hypokalemia: Secondary | ICD-10-CM

## 2017-11-29 DIAGNOSIS — Z7984 Long term (current) use of oral hypoglycemic drugs: Secondary | ICD-10-CM | POA: Insufficient documentation

## 2017-11-29 DIAGNOSIS — Z7982 Long term (current) use of aspirin: Secondary | ICD-10-CM | POA: Insufficient documentation

## 2017-11-29 DIAGNOSIS — E039 Hypothyroidism, unspecified: Secondary | ICD-10-CM | POA: Insufficient documentation

## 2017-11-29 DIAGNOSIS — Z79890 Hormone replacement therapy (postmenopausal): Secondary | ICD-10-CM | POA: Insufficient documentation

## 2017-11-29 DIAGNOSIS — E1169 Type 2 diabetes mellitus with other specified complication: Secondary | ICD-10-CM

## 2017-11-29 DIAGNOSIS — E1122 Type 2 diabetes mellitus with diabetic chronic kidney disease: Secondary | ICD-10-CM | POA: Insufficient documentation

## 2017-11-29 DIAGNOSIS — N182 Chronic kidney disease, stage 2 (mild): Secondary | ICD-10-CM | POA: Insufficient documentation

## 2017-11-29 LAB — GLUCOSE, POCT (MANUAL RESULT ENTRY): POC GLUCOSE: 117 mg/dL — AB (ref 70–99)

## 2017-11-29 NOTE — Progress Notes (Signed)
Patient ID: Linda Phelps, female   DOB: 12/05/1970, 48 y.o.   MRN: [id number]       Linda Phelps, is a 47 y.o. female  KZS:[id number]  [id number]  DOB - [birth year]  Subjective:  Chief Complaint and HPI: Linda Phelps is a 47 y.o. female here today for a follow up visit After hospitalization for elevated enzymes and CP for cardiac work up 6/30-7/05/2017.  CP was ultimately believed to be GERD and stress related.  Outpatient f/up with cardiology was recommended and is scheduled for 12/05/2017.  She denies any further episodes of CP.  She is requesting gastroenterology referal due to reflux.  Having a hard time losing weight  From discharge summary: 47 y.o.femalewithhistory of postpartum cardiomyopathy last EF measured in May 2017 was showing EF of 55 to 60% with grade 1 diastolic dysfunction, hypertension, diabetes mellitus presents to the ER with complaints of chest pain. Patient started developing chest pain retrosternal burning sensation radiating to her neck felt like acid reflux after she had some noodles last evening. She took some Tylenol and went from bed and woke up this morning with similar symptoms and went to the ER admits in [facility]. Denies any nausea vomiting abdominal pain shortness of breath diaphoresis or any change in recent medications.  ED Course:In the ER patient chest pain improved with GI cocktail. Troponin is mildly elevated EKG shows normal sinus rhythm with LVH. Patient at this time is still having some burning sensation in the lower part of the sternum. Admitted for further cardiac work-up.    Hospital Course:  Principal Problem:   Chest pain Active Problems:   Diabetes mellitus type 2 in obese [facility])   Essential hypertension   Hyperlipidemia   Hypothyroidism   Atypical chest pain  Chest pain , likely from GERD and anxiety, chest pain resolved with gi cocktail. but patient does has significant cardiac risk factors. EKG no acute changes, Troponin  mild and flat at Echo lvef wnl, wall motion was normal. Cardiology consulted recommended outpatient stress test.  Palpitations: cardiology is to set up outpatient cardiac monitor  Chronic diastolic chf: History of postpartum cardiomyopathy last EF measured in 2017 showed EF of 55 to 60% with grade 1 diastolic dysfunction on Lasix which will be continued. 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 EENT:  No vision changes, No blurry vision, No hearing changes. No mouth, throat, or ear problems.  Respiratory: No cough, No SOB Cardiac: No CP, no palpitations GI:  No abd pain, No N/V/D. GU: No Urinary s/sx Musculoskeletal: No joint pain Neuro: No headache, no dizziness, no motor weakness.  Skin: No rash Endocrine:  No polydipsia. No polyuria.  Psych: Denies SI/HI  No problems updated.  ALLERGIES: Allergies  Allergen Reactions  . Ibuprofen Swelling    PAST MEDICAL HISTORY: Past Medical History:  Diagnosis Date  . CHF (congestive heart failure) ([place])   . Diabetes mellitus without complication ([place])   . Ectopic pregnancy   . High cholesterol   . Hypertension   . Low back pain   . Obesity   . Thyroid disease     MEDICATIONS AT  HOME: Prior to Admission medications   Medication Sig Start Date End Date Taking? Authorizing Provider  acetaminophen (TYLENOL) 500 MG tablet Take 2 tablets (1,000 mg total) by mouth every 6 (six) hours as needed for mild pain or moderate pain. 01/08/17  Yes Hairston, Maylon Peppers, FNP  amLODipine (NORVASC) 10 MG tablet Take 1 tablet (10 mg total) by mouth daily.  05/09/17  Yes Argentina Donovan, PA-C  aspirin 81 MG tablet Take 1 tablet (81 mg total) by mouth daily. 10/17/17  Yes Freeman Caldron M, PA-C  carvedilol (COREG) 12.5 MG tablet Take 1 tablet (12.5 mg total) by mouth 2 (two) times daily with a meal. 10/17/17  Yes Kyriakos Babler M, PA-C  clonazePAM (KLONOPIN) 0.5 MG tablet Take 1 tablet (0.5 mg total) by mouth 2 (two) times daily as needed for anxiety. 11/19/17 11/19/18 Yes Florencia Reasons, MD  cyclobenzaprine (FLEXERIL) 10 MG tablet Take 1 tablet (10 mg total) by mouth 2 (two) times daily as needed for muscle spasms. 10/31/17  Yes Gildardo Pounds, NP  famotidine (PEPCID) 20 MG tablet Take 1 tablet (20 mg total) by mouth 2 (two) times daily. 10/17/17  Yes Tashana Haberl M, PA-C  fluticasone (FLONASE) 50 MCG/ACT nasal spray Place 2 sprays into both nostrils daily. 10/08/17  Yes Gildardo Pounds, NP  furosemide (LASIX) 20 MG tablet Take 1 tablet (20 mg total) by mouth 2 (two) times daily. 10/17/17  Yes Penelopi Mikrut, Dionne Bucy, PA-C  levothyroxine (SYNTHROID, LEVOTHROID) 50 MCG tablet Take 1 tablet (50 mcg total) by mouth daily. 10/17/17  Yes Ardell Makarewicz M, PA-C  lisinopril (PRINIVIL,ZESTRIL) 40 MG tablet Take 1 tablet (40 mg total) by mouth daily. 10/17/17  Yes Freeman Caldron M, PA-C  metFORMIN (GLUCOPHAGE) 1000 MG tablet Take 1 tablet (1,000 mg total) by mouth 2 (two) times daily with a meal. 10/17/17  Yes Cynethia Schindler M, PA-C  ondansetron (ZOFRAN ODT) 8 MG disintegrating tablet Take 1 tablet (8 mg total) by mouth every 8 (eight) hours as needed for nausea or vomiting. 07/20/17  Yes Molpus, John, MD  triamcinolone cream (KENALOG) 0.1 % Apply 1 application topically 2 (two) times daily. 10/17/17  Yes Taija Mathias, Dionne Bucy, PA-C     Objective:  EXAM:   Vitals:   11/29/17 0904  BP: (!) 145/85  Pulse: 82  Resp: 18  Temp: 98.4 F (36.9 C)  TempSrc: Oral  SpO2: 100%  Weight: 206 lb (93.4 kg)  Height: 5\' 1"  (1.549 m)    General appearance : A&OX3. NAD.  Non-toxic-appearing HEENT: Atraumatic and Normocephalic.  PERRLA. EOM intact.  Neck: supple, no JVD. No cervical lymphadenopathy. No thyromegaly Chest/Lungs:  Breathing-non-labored, Good air entry bilaterally, breath sounds normal without rales, rhonchi, or wheezing  CVS: S1 S2 regular, no murmurs, gallops, rubs  Abdomen: Bowel sounds present, Non tender and not distended with no gaurding, rigidity or rebound. Extremities: Bilateral Lower Ext shows no edema, both legs are warm to touch with = pulse throughout Neurology:  CN II-XII grossly intact, Non focal.   Psych:  TP linear. J/I WNL. Normal speech. Appropriate eye contact and affect.  Skin:  No Rash  Data Review Lab Results  Component Value Date   HGBA1C 7.3 (A) 10/17/2017   HGBA1C 6.3 05/09/2017   HGBA1C 6.0 01/08/2017     Assessment & Plan   1. Hospital discharge follow-up Improving; no further CP  2. Diabetes mellitus type 2 in obese (HCC)   Glucose is good today.  Check blood sugars fasting and  at bedtime and bring to next visit.  - Glucose (CBG) Continue current regimen  3. Hypokalemia - Basic metabolic panel  4. Essential hypertension Check blood pressure 3 times/week and record and bring to next visit.  Continue current regimen.  Keep cardiology f/up appt  5. Gastroesophageal reflux disease, esophagitis presence not specified Continue pepcid - Ambulatory referral to Gastroenterology  6. Chest pain, unspecified type Resolved-keep cardiology f/up  7.  Obesity-work on diet and exercise.  counseled at length about this and all of the above as weight loss would help many of her comorbidities.     Patient have been counseled extensively about nutrition and exercise  Return in about 1 month (around 12/30/2017) for zelda Raul Del for 3 month follow-up.  The patient was given clear instructions to go to ER or return to medical center if symptoms don't improve, worsen or new problems develop. The patient verbalized  understanding. The patient was told to call to get lab results if they haven't heard anything in the next week.     Freeman Caldron, PA-C [facility] [place], [place]   11/29/2017, 9:15 AM

## 2017-11-29 NOTE — Patient Instructions (Signed)
Check blood pressure 3 times/week and record and bring to next visit.  Check blood sugars fasting and at bedtime and bring to next visit.     Nonspecific Chest Pain Chest pain can be caused by many different conditions. There is a chance that your pain could be related to something serious, such as a heart attack or a blood clot in your lungs. Chest pain can also be caused by conditions that are not life-threatening. If you have chest pain, it is very important to follow up with your doctor. Follow these instructions at home: Medicines  If you were prescribed an antibiotic medicine, take it as told by your doctor. Do not stop taking the antibiotic even if you start to feel better.  Take over-the-counter and prescription medicines only as told by your doctor. Lifestyle  Do not use any products that contain nicotine or tobacco, such as cigarettes and e-cigarettes. If you need help quitting, ask your doctor.  Do not drink alcohol.  Make lifestyle changes as told by your doctor. These may include: ? Getting regular exercise. Ask your doctor for some activities that are safe for you. ? Eating a heart-healthy diet. A diet specialist (dietitian) can help you to learn healthy eating options. ? Staying at a healthy weight. ? Managing diabetes, if needed. ? Lowering your stress, as with deep breathing or spending time in nature. General instructions  Avoid any activities that make you feel chest pain.  If your chest pain is because of heartburn: ? Raise (elevate) the head of your bed about 6 inches (15 cm). You can do this by putting blocks under the bed legs at the head of the bed. ? Do not sleep with extra pillows under your head. That does not help heartburn.  Keep all follow-up visits as told by your doctor. This is important. This includes any further testing if your chest pain does not go away. Contact a doctor if:  Your chest pain does not go away.  You have a rash with blisters on  your chest.  You have a fever.  You have chills. Get help right away if:  Your chest pain is worse.  You have a cough that gets worse, or you cough up blood.  You have very bad (severe) pain in your belly (abdomen).  You are very weak.  You pass out (faint).  You have either of these for no clear reason: ? Sudden chest discomfort. ? Sudden discomfort in your arms, back, neck, or jaw.  You have shortness of breath at any time.  You suddenly start to sweat, or your skin gets clammy.  You feel sick to your stomach (nauseous).  You throw up (vomit).  You suddenly feel light-headed or dizzy.  Your heart starts to beat fast, or it feels like it is skipping beats. These symptoms may be an emergency. Do not wait to see if the symptoms will go away. Get medical help right away. Call your local emergency services (911 in the [place] Do not drive yourself to the hospital. This information is not intended to replace advice given to you by your health care provider. Make sure you discuss any questions you have with your health care provider. Document Released: 10/25/2007 Document Revised: 01/31/2016 Document Reviewed: 01/31/2016 Elsevier Interactive Patient Education  2017 [place]. Gastroesophageal Reflux Disease, Adult Normally, food travels down the esophagus and stays in the stomach to be digested. If a person has gastroesophageal reflux disease (GERD), food and stomach acid move  back up into the esophagus. When this happens, the esophagus becomes sore and swollen (inflamed). Over time, GERD can make small holes (ulcers) in the lining of the esophagus. Follow these instructions at home: Diet  Follow a diet as told by your doctor. You may need to avoid foods and drinks such as: ? Coffee and tea (with or without caffeine). ? Drinks that contain alcohol. ? Energy drinks and sports drinks. ? Carbonated drinks or sodas. ? Chocolate and cocoa. ? Peppermint and mint  flavorings. ? Garlic and onions. ? Horseradish. ? Spicy and acidic foods, such as peppers, chili powder, curry powder, vinegar, hot sauces, and BBQ sauce. ? Citrus fruit juices and citrus fruits, such as oranges, lemons, and limes. ? Tomato-based foods, such as red sauce, chili, salsa, and pizza with red sauce. ? Fried and fatty foods, such as donuts, french fries, potato chips, and high-fat dressings. ? High-fat meats, such as hot dogs, rib eye steak, sausage, ham, and bacon. ? High-fat dairy items, such as whole milk, butter, and cream cheese.  Eat small meals often. Avoid eating large meals.  Avoid drinking large amounts of liquid with your meals.  Avoid eating meals during the 2-3 hours before bedtime.  Avoid lying down right after you eat.  Do not exercise right after you eat. General instructions  Pay attention to any changes in your symptoms.  Take over-the-counter and prescription medicines only as told by your doctor. Do not take aspirin, ibuprofen, or other NSAIDs unless your doctor says it is okay.  Do not use any tobacco products, including cigarettes, chewing tobacco, and e-cigarettes. If you need help quitting, ask your doctor.  Wear loose clothes. Do not wear anything tight around your waist.  Raise (elevate) the head of your bed about 6 inches (15 cm).  Try to lower your stress. If you need help doing this, ask your doctor.  If you are overweight, lose an amount of weight that is healthy for you. Ask your doctor about a safe weight loss goal.  Keep all follow-up visits as told by your doctor. This is important. Contact a doctor if:  You have new symptoms.  You lose weight and you do not know why it is happening.  You have trouble swallowing, or it hurts to swallow.  You have wheezing or a cough that keeps happening.  Your symptoms do not get better with treatment.  You have a hoarse voice. Get help right away if:  You have pain in your arms, neck,  jaw, teeth, or back.  You feel sweaty, dizzy, or light-headed.  You have chest pain or shortness of breath.  You throw up (vomit) and your throw up looks like blood or coffee grounds.  You pass out (faint).  Your poop (stool) is bloody or black.  You cannot swallow, drink, or eat. This information is not intended to replace advice given to you by your health care provider. Make sure you discuss any questions you have with your health care provider. Document Released: 10/25/2007 Document Revised: 10/14/2015 Document Reviewed: 09/02/2014 Elsevier Interactive Patient Education  [employer].

## 2017-11-30 ENCOUNTER — Telehealth: Payer: Self-pay | Admitting: *Deleted

## 2017-11-30 LAB — BASIC METABOLIC PANEL
BUN / CREAT RATIO: 12 (ref 9–23)
BUN: 13 mg/dL (ref 6–24)
CO2: 22 mmol/L (ref 20–29)
CREATININE: 1.09 mg/dL — AB (ref 0.57–1.00)
Calcium: 9.6 mg/dL (ref 8.7–10.2)
Chloride: 101 mmol/L (ref 96–106)
GFR, EST AFRICAN AMERICAN: 69 mL/min/{1.73_m2} (ref >59)
GFR, EST NON AFRICAN AMERICAN: 60 mL/min/{1.73_m2} (ref >59)
GLUCOSE: 125 mg/dL — AB (ref 65–99)
Potassium: 4.3 mmol/L (ref 3.5–5.2)
Sodium: 140 mmol/L (ref 134–144)

## 2017-11-30 NOTE — Telephone Encounter (Signed)
-----   Message from Argentina Donovan, [place] sent at 11/30/2017  8:33 AM EDT ----- Please call patient.  Kidney function has improved a little since hospitalization.  Keep blood pressure and blood sugar controlled.  Follow-up with cardiologist as planned.  You will be called with a gastroenterology appt within  A couple of weeks. Thanks, Freeman Caldron, PA-C

## 2017-11-30 NOTE — Telephone Encounter (Signed)
No answer

## 2017-12-05 ENCOUNTER — Ambulatory Visit: Payer: Self-pay | Admitting: Cardiology

## 2017-12-06 ENCOUNTER — Encounter: Payer: Self-pay | Admitting: Cardiology

## 2017-12-07 ENCOUNTER — Encounter: Payer: Self-pay | Admitting: Cardiology

## 2017-12-12 ENCOUNTER — Encounter: Payer: Self-pay | Admitting: Cardiology

## 2017-12-17 ENCOUNTER — Ambulatory Visit: Payer: Self-pay | Admitting: Cardiology

## 2017-12-17 ENCOUNTER — Other Ambulatory Visit: Payer: Self-pay

## 2017-12-17 DIAGNOSIS — R002 Palpitations: Secondary | ICD-10-CM

## 2017-12-19 ENCOUNTER — Telehealth (HOSPITAL_COMMUNITY): Payer: Self-pay | Admitting: *Deleted

## 2017-12-19 NOTE — Telephone Encounter (Signed)
Patient given detailed instructions per Myocardial Perfusion Study Information Sheet for the test on 12/24/17. Patient notified to arrive 15 minutes early and that it is imperative to arrive on time for appointment to keep from having the test rescheduled.  If you need to cancel or reschedule your appointment, please call the office within 24 hours of your appointment. . Patient verbalized understanding. Linda Phelps

## 2017-12-20 ENCOUNTER — Emergency Department (HOSPITAL_BASED_OUTPATIENT_CLINIC_OR_DEPARTMENT_OTHER)
Admission: EM | Admit: 2017-12-20 | Discharge: 2017-12-21 | Disposition: A | Discharge status: Home / Self Care | Payer: Self-pay | Attending: Emergency Medicine | Admitting: Emergency Medicine

## 2017-12-20 ENCOUNTER — Other Ambulatory Visit: Payer: Self-pay

## 2017-12-20 ENCOUNTER — Encounter (HOSPITAL_BASED_OUTPATIENT_CLINIC_OR_DEPARTMENT_OTHER): Payer: Self-pay

## 2017-12-20 ENCOUNTER — Emergency Department (HOSPITAL_BASED_OUTPATIENT_CLINIC_OR_DEPARTMENT_OTHER): Payer: Self-pay

## 2017-12-20 DIAGNOSIS — I493 Ventricular premature depolarization: Secondary | ICD-10-CM | POA: Insufficient documentation

## 2017-12-20 DIAGNOSIS — I110 Hypertensive heart disease with heart failure: Secondary | ICD-10-CM | POA: Insufficient documentation

## 2017-12-20 DIAGNOSIS — Z7984 Long term (current) use of oral hypoglycemic drugs: Secondary | ICD-10-CM | POA: Insufficient documentation

## 2017-12-20 DIAGNOSIS — Z7982 Long term (current) use of aspirin: Secondary | ICD-10-CM | POA: Insufficient documentation

## 2017-12-20 DIAGNOSIS — R0789 Other chest pain: Secondary | ICD-10-CM | POA: Insufficient documentation

## 2017-12-20 DIAGNOSIS — I509 Heart failure, unspecified: Secondary | ICD-10-CM | POA: Insufficient documentation

## 2017-12-20 DIAGNOSIS — E119 Type 2 diabetes mellitus without complications: Secondary | ICD-10-CM | POA: Insufficient documentation

## 2017-12-20 LAB — CBC
HCT: 36.7 % (ref 36.0–46.0)
Hemoglobin: 12.3 g/dL (ref 12.0–15.0)
MCH: 30.5 pg (ref 26.0–34.0)
MCHC: 33.5 g/dL (ref 30.0–36.0)
MCV: 91.1 fL (ref 78.0–100.0)
Platelets: 273 10*3/uL (ref 150–400)
RBC: 4.03 MIL/uL (ref 3.87–5.11)
RDW: 12.9 % (ref 11.5–15.5)
WBC: 6.8 10*3/uL (ref 4.0–10.5)

## 2017-12-20 LAB — BASIC METABOLIC PANEL
Anion gap: 9 (ref 5–15)
BUN: 19 mg/dL (ref 6–20)
CALCIUM: 8.8 mg/dL — AB (ref 8.9–10.3)
CO2: 27 mmol/L (ref 22–32)
CREATININE: 1.15 mg/dL — AB (ref 0.44–1.00)
Chloride: 104 mmol/L (ref 98–111)
GFR, EST NON AFRICAN AMERICAN: 55 mL/min — AB (ref >60)
Glucose, Bld: 115 mg/dL — ABNORMAL HIGH (ref 70–99)
Potassium: 3.4 mmol/L — ABNORMAL LOW (ref 3.5–5.1)
SODIUM: 140 mmol/L (ref 135–145)

## 2017-12-20 LAB — TROPONIN I: Troponin I: <0.03 ng/mL (ref <0.03)

## 2017-12-20 NOTE — ED Triage Notes (Signed)
C/o CP tightness x 30 min-states she has been wearing a heart monitor x 4 days from cards and is scheduled for stress test next week-NAD-steady gait

## 2017-12-20 NOTE — ED Notes (Signed)
Pt ambulatory to restroom without assistance 

## 2017-12-20 NOTE — ED Notes (Signed)
Pt had an episode of left side chest tightness where her cardiac monitor was located.  Pt is not currently wearing cardiac monitor, is reading a magazine with daughter at bedside.

## 2017-12-20 NOTE — ED Notes (Signed)
Pt called nurse in to explain why her blood pressure was trending up.  Nurse explained that the pressure will increase with movement, multiple times taking it, but that we are monitoring it.  Pt denies weakness, and nurse explained that it may be high for her, but that it is more dangerous to drop her pressure rapidly.  Pt updated as to the time of her next blood draw as well and denies any needs at this time

## 2017-12-20 NOTE — ED Provider Notes (Signed)
[facility] Provider Note: [name], MD, FACEP  CSN: [id number] MRN: [id number] ARRIVAL: 12/20/17 at 2027 ROOM: [place]  Chest Pain   HISTORY OF PRESENT ILLNESS  12/20/17 10:53 PM Linda Phelps is a 47 y.o. female who is been wearing a heart monitor on her left upper chest for evaluation of palpitations.  She had not heart monitor placed 4 days ago.  She is here with chest tightness that began at the site of the heart monitor about 8 PM this evening.  She describes the tightness as moderate in severity and radiated to her left arm.  It was associated with some mild shortness of breath and a feeling of warmth but no diaphoresis or nausea.  Nothing made the symptoms better or worse.  The tightness was present for about an hour and a half but has subsequently resolved.  She did take the heart monitor off when her symptoms began.   Past Medical History:  Diagnosis Date  . CHF (congestive heart failure) ([place])   . Diabetes mellitus without complication ([place])   . Ectopic pregnancy   . High cholesterol   . Hypertension   . Low back pain   . Obesity   . Thyroid disease     Past Surgical History:  Procedure Laterality Date  . APPENDECTOMY    . CHOLECYSTECTOMY      Family History  Problem Relation Age of Onset  . Diabetes Mother   . Diabetes Maternal Grandmother   . Colon cancer Maternal Uncle 16    Social History   Tobacco Use  . Smoking status: Never Smoker  . Smokeless tobacco: Never Used  Substance Use Topics  . Alcohol use: Yes    Alcohol/week: 0.0 oz    Comment: occ  . Drug use: No    Prior to Admission medications   Medication Sig Start Date End Date Taking? Authorizing Provider  acetaminophen (TYLENOL) 500 MG tablet Take 2 tablets (1,000 mg total) by mouth every 6 (six) hours as needed for mild pain or moderate pain. 01/08/17   Alfonse Spruce, FNP  amLODipine (NORVASC) 10 MG tablet Take 1 tablet (10 mg total) by mouth  daily. 05/09/17   Argentina Donovan, PA-C  aspirin 81 MG tablet Take 1 tablet (81 mg total) by mouth daily. 10/17/17   Argentina Donovan, PA-C  carvedilol (COREG) 12.5 MG tablet Take 1 tablet (12.5 mg total) by mouth 2 (two) times daily with a meal. 10/17/17   McClung, Dionne Bucy, PA-C  clonazePAM (KLONOPIN) 0.5 MG tablet Take 1 tablet (0.5 mg total) by mouth 2 (two) times daily as needed for anxiety. 11/19/17 11/19/18  Florencia Reasons, MD  cyclobenzaprine (FLEXERIL) 10 MG tablet Take 1 tablet (10 mg total) by mouth 2 (two) times daily as needed for muscle spasms. 10/31/17   Gildardo Pounds, NP  famotidine (PEPCID) 20 MG tablet Take 1 tablet (20 mg total) by mouth 2 (two) times daily. 10/17/17   Argentina Donovan, PA-C  fluticasone (FLONASE) 50 MCG/ACT nasal spray Place 2 sprays into both nostrils daily. 10/08/17   Gildardo Pounds, NP  furosemide (LASIX) 20 MG tablet Take 1 tablet (20 mg total) by mouth 2 (two) times daily. 10/17/17   Argentina Donovan, PA-C  levothyroxine (SYNTHROID, LEVOTHROID) 50 MCG tablet Take 1 tablet (50 mcg total) by mouth daily. 10/17/17   Argentina Donovan, PA-C  lisinopril (PRINIVIL,ZESTRIL) 40 MG tablet Take 1 tablet (40 mg total) by  mouth daily. 10/17/17   Argentina Donovan, PA-C  metFORMIN (GLUCOPHAGE) 1000 MG tablet Take 1 tablet (1,000 mg total) by mouth 2 (two) times daily with a meal. 10/17/17   McClung, Dionne Bucy, PA-C  ondansetron (ZOFRAN ODT) 8 MG disintegrating tablet Take 1 tablet (8 mg total) by mouth every 8 (eight) hours as needed for nausea or vomiting. 07/20/17   Lova Urbieta, MD  triamcinolone cream (KENALOG) 0.1 % Apply 1 application topically 2 (two) times daily. 10/17/17   Argentina Donovan, PA-C    Allergies Ibuprofen   REVIEW OF SYSTEMS  Negative except as noted here or in the History of Present Illness.   PHYSICAL EXAMINATION  Initial Vital Signs Blood pressure 132/76, pulse 64, temperature 98.5 F (36.9 C), temperature source Oral, resp. rate 16, height 5\' 1"   (1.549 m), weight 93 kg (205 lb), last menstrual period 10/08/2013, SpO2 98 %.  Examination General: Well-developed, well-nourished female in no acute distress; appearance consistent with age of record HENT: normocephalic; atraumatic Eyes: pupils equal, round and reactive to light; extraocular muscles intact Neck: supple Heart: regular rate and rhythm; frequent PVCs Lungs: clear to auscultation bilaterally Chest: No significant tenderness at previous site of heart monitor Abdomen: soft; nondistended; nontender; no masses or hepatosplenomegaly; bowel sounds present Extremities: No deformity; full range of motion; pulses normal Neurologic: Awake, alert and oriented; motor function intact in all extremities and symmetric; no facial droop Skin: Warm and dry Psychiatric: Normal mood and affect   RESULTS  Summary of this visit's results, reviewed by myself:   EKG Interpretation  Date/Time:  Thursday December 20 2017 20:36:11 EDT Ventricular Rate:  69 PR Interval:  182 QRS Duration: 98 QT Interval:  400 QTC Calculation: 428 R Axis:   50 Text Interpretation:  Normal sinus rhythm Nonspecific T wave abnormality Abnormal ECG No significant change was found Confirmed by Tyjanae Bartek, Jenny Reichmann [phone]) on 12/20/2017 10:42:12 PM      Laboratory Studies: Results for orders placed or performed during the hospital encounter of 12/20/17 (from the past 24 hour(s))  Basic metabolic panel     Status: Abnormal   Collection Time: 12/20/17 10:05 PM  Result Value Ref Range   Sodium 140 135 - 145 mmol/L   Potassium 3.4 (L) 3.5 - 5.1 mmol/L   Chloride 104 98 - 111 mmol/L   CO2 27 22 - 32 mmol/L   Glucose, Bld 115 (H) 70 - 99 mg/dL   BUN 19 6 - 20 mg/dL   Creatinine, Ser 1.15 (H) 0.44 - 1.00 mg/dL   Calcium 8.8 (L) 8.9 - 10.3 mg/dL   GFR calc non Af Amer 55 (L) >60 mL/min   GFR calc Af Amer >60 >60 mL/min   Anion gap 9 5 - 15  CBC     Status: None   Collection Time: 12/20/17 10:05 PM  Result Value Ref Range     WBC 6.8 4.0 - 10.5 K/uL   RBC 4.03 3.87 - 5.11 MIL/uL   Hemoglobin 12.3 12.0 - 15.0 g/dL   HCT 36.7 36.0 - 46.0 %   MCV 91.1 78.0 - 100.0 fL   MCH 30.5 26.0 - 34.0 pg   MCHC 33.5 30.0 - 36.0 g/dL   RDW 12.9 11.5 - 15.5 %   Platelets 273 150 - 400 K/uL  Troponin I     Status: None   Collection Time: 12/20/17 10:05 PM  Result Value Ref Range   Troponin I <0.03 <0.03 ng/mL  Troponin I  Status: None   Collection Time: 12/21/17  1:01 AM  Result Value Ref Range   Troponin I <0.03 <0.03 ng/mL   Imaging Studies: Dg Chest 2 View  Result Date: 12/20/2017 CLINICAL DATA:  47 y/o  F; chest pain. EXAM: CHEST - 2 VIEW COMPARISON:  11/18/2017 chest radiograph. FINDINGS: Mild cardiomegaly given projection and technique. No consolidation, effusion, or pneumothorax. Bones are unremarkable. Right upper quadrant cholecystectomy clips. IMPRESSION: 1. Mild cardiomegaly. 2. No acute pulmonary process identified. Electronically Signed   By: Kristine Garbe M.D.   On: 12/20/2017 22:00    ED COURSE and MDM  Nursing notes and initial vitals signs, including pulse oximetry, reviewed.  Vitals:   12/20/17 2230 12/20/17 2300 12/20/17 2330 12/21/17 0000  BP: 132/76 (!) 142/74 (!) 160/91 (!) 150/87  Pulse: 64 75 66 74  Resp: 16 15 17 19   Temp:      TempSrc:      SpO2: 98% 100% 98% 99%  Weight:      Height:       1:35 AM Patient remains asymptomatic.  She will contact her cardiologist later this morning.  PROCEDURES    ED DIAGNOSES     ICD-10-CM   1. Atypical chest pain R07.89   2. PVCs (premature ventricular contractions) I49.3        Eleonor Ocon, MD 12/21/17 [phone]

## 2017-12-21 LAB — TROPONIN I: Troponin I: <0.03 ng/mL (ref <0.03)

## 2017-12-21 MED ORDER — ACETAMINOPHEN 325 MG PO TABS
650.0000 mg | ORAL_TABLET | Freq: Once | ORAL | Status: AC
  Administered 2017-12-21: 650 mg via ORAL
  Filled 2017-12-21: qty 2

## 2017-12-21 NOTE — ED Notes (Signed)
Pt verbalizes understanding of d/c instructions and denies any further needs at this time. 

## 2017-12-24 ENCOUNTER — Encounter (HOSPITAL_COMMUNITY): Payer: Self-pay

## 2017-12-28 ENCOUNTER — Other Ambulatory Visit: Payer: Self-pay | Admitting: Physician Assistant

## 2017-12-28 DIAGNOSIS — K219 Gastro-esophageal reflux disease without esophagitis: Secondary | ICD-10-CM

## 2017-12-31 ENCOUNTER — Telehealth (HOSPITAL_COMMUNITY): Payer: Self-pay | Admitting: *Deleted

## 2017-12-31 NOTE — Telephone Encounter (Signed)
Attempted to leave message on voicemail in reference to upcoming appointment scheduled for 01/03/18 but no answer or voicemail available.  Elward Nocera, [place]

## 2018-01-01 ENCOUNTER — Telehealth (HOSPITAL_COMMUNITY): Payer: Self-pay | Admitting: *Deleted

## 2018-01-01 NOTE — Telephone Encounter (Signed)
Left message on voicemail of cell # in reference to upcoming appointment scheduled for 01/03/18. No answer on home # and no voicemail. Phone number given for a call back so details instructions can be given. Mariabella Nilsen, [place]anae [place]

## 2018-01-03 ENCOUNTER — Encounter (HOSPITAL_COMMUNITY): Payer: Self-pay

## 2018-01-04 ENCOUNTER — Ambulatory Visit: Payer: Self-pay | Admitting: Nurse Practitioner

## 2018-01-09 ENCOUNTER — Encounter (HOSPITAL_COMMUNITY): Payer: Self-pay

## 2018-01-16 ENCOUNTER — Other Ambulatory Visit: Payer: Self-pay | Admitting: Physician Assistant

## 2018-01-16 DIAGNOSIS — I10 Essential (primary) hypertension: Secondary | ICD-10-CM

## 2018-01-17 ENCOUNTER — Ambulatory Visit: Payer: Self-pay

## 2018-01-17 NOTE — Progress Notes (Deleted)
Patient ID: Linda Phelps, female   DOB: 28-Nov-1970, 47 y.o.   MRN: [id number]    After being seen in the ED for CP and palpitations while wearing a heart monitor on 12/20/2017.  Enzymes were negative. Potassium was 3.4.    EKG Interpretation  Date/Time:                        Thursday December 20 2017 20:36:11 EDT Ventricular Rate:   69 PR Interval:                      182 QRS Duration:        98 QT Interval:                      400 QTC Calculation:    428 R Axis:                         50 Text Interpretation:  Normal sinus rhythm Nonspecific T wave abnormality Abnormal ECG No significant change was found Confirmed by [place], John [phone]) on 12/20/2017 10:42:12 PM

## 2018-01-18 ENCOUNTER — Ambulatory Visit: Payer: Self-pay | Admitting: Cardiology

## 2018-01-22 ENCOUNTER — Telehealth (HOSPITAL_COMMUNITY): Payer: Self-pay | Admitting: *Deleted

## 2018-01-22 NOTE — Telephone Encounter (Signed)
Patient's mom Linda Phelps per DPR given detailed instructions per Myocardial Perfusion Study Information Sheet for the test on 01/25/18 at 0945. Patient notified to arrive 15 minutes early and that it is imperative to arrive on time for appointment to keep from having the test rescheduled.  If you need to cancel or reschedule your appointment, please call the office within 24 hours of your appointment. . Patient verbalized understanding.Linda Phelps, Linda Phelps

## 2018-01-25 ENCOUNTER — Encounter (HOSPITAL_COMMUNITY): Payer: Self-pay

## 2018-02-13 ENCOUNTER — Telehealth: Payer: Self-pay | Admitting: *Deleted

## 2018-02-13 NOTE — Telephone Encounter (Signed)
Medical Assistant left message on patient's home and cell voicemail. Voicemail states to give a call back to [place] with [facility] at [phone]. !!!Patient has appt at 4:10. Please ask patient to come in at 8:30am. Patient may also come in earlier in the day or reschedule!!!

## 2018-02-14 ENCOUNTER — Ambulatory Visit: Payer: Self-pay

## 2018-03-07 ENCOUNTER — Telehealth: Payer: Self-pay | Admitting: *Deleted

## 2018-03-07 NOTE — Telephone Encounter (Signed)
Tried to reach pt again today about the monitor she had placed on 7/29. It has not been received at [facility] yet and trying to find out how pt mailed it back. I had spoken to her on 8/29 and she said her mother had mailed it back and was going to let me know if it was regular mail or UPS. I have not heard back from her so tried calling again today with no success, the phone rings and then just stops with no way to leave message. Will keep trying to reach pt.

## 2018-03-20 ENCOUNTER — Telehealth: Payer: Self-pay | Admitting: *Deleted

## 2018-03-20 NOTE — Telephone Encounter (Signed)
Attempted to call pt again today about monitor with no luck.

## 2018-03-25 ENCOUNTER — Other Ambulatory Visit: Payer: Self-pay | Admitting: Physician Assistant

## 2018-03-25 DIAGNOSIS — K219 Gastro-esophageal reflux disease without esophagitis: Secondary | ICD-10-CM

## 2018-03-28 ENCOUNTER — Telehealth: Payer: Self-pay | Admitting: Nurse Practitioner

## 2018-03-28 DIAGNOSIS — E1121 Type 2 diabetes mellitus with diabetic nephropathy: Secondary | ICD-10-CM

## 2018-03-28 DIAGNOSIS — I10 Essential (primary) hypertension: Secondary | ICD-10-CM

## 2018-03-28 DIAGNOSIS — E039 Hypothyroidism, unspecified: Secondary | ICD-10-CM

## 2018-03-28 NOTE — Telephone Encounter (Signed)
1) Medication(s) Requested (by name): -furosemide (LASIX) 20 MG tablet  -carvedilol (COREG) 12.5 MG tablet  -metFORMIN (GLUCOPHAGE) 1000 MG tablet  -amLODipine (NORVASC) 10 MG tablet  -levothyroxine (SYNTHROID, LEVOTHROID) 50 MCG tablet    2) Pharmacy of Choice: -[place], [place] [place]  3) Special Requests:   Approved medications will be sent to the pharmacy, we will reach out if there is an issue.  Requests made after 3pm may not be addressed until the following business day!  If a patient is unsure of the name of the medication(s) please note and ask patient to call back when they are able to provide all info, do not send to responsible party until all information is available!

## 2018-03-29 MED ORDER — FUROSEMIDE 20 MG PO TABS: 20.0000 mg | ORAL_TABLET | Freq: Two times a day (BID) | ORAL | 0 refills | Status: DC

## 2018-03-29 MED ORDER — LEVOTHYROXINE SODIUM 50 MCG PO TABS: 50.0000 ug | ORAL_TABLET | Freq: Every day | ORAL | 0 refills | Status: DC

## 2018-03-29 MED ORDER — AMLODIPINE BESYLATE 10 MG PO TABS: 10.0000 mg | ORAL_TABLET | Freq: Every day | ORAL | 0 refills | Status: DC

## 2018-03-29 MED ORDER — CARVEDILOL 12.5 MG PO TABS: 12.5000 mg | ORAL_TABLET | Freq: Two times a day (BID) | ORAL | 0 refills | Status: DC

## 2018-03-29 NOTE — Telephone Encounter (Signed)
Pt should have plenty of refills left of metformin with her pharmacy, I will send in the other RX's

## 2018-04-06 ENCOUNTER — Encounter (HOSPITAL_BASED_OUTPATIENT_CLINIC_OR_DEPARTMENT_OTHER): Payer: Self-pay | Admitting: Emergency Medicine

## 2018-04-06 ENCOUNTER — Other Ambulatory Visit: Payer: Self-pay

## 2018-04-06 ENCOUNTER — Emergency Department (HOSPITAL_BASED_OUTPATIENT_CLINIC_OR_DEPARTMENT_OTHER)
Admission: EM | Admit: 2018-04-06 | Discharge: 2018-04-06 | Disposition: A | Discharge status: Home / Self Care | Payer: Self-pay | Attending: Emergency Medicine | Admitting: Emergency Medicine

## 2018-04-06 ENCOUNTER — Emergency Department (HOSPITAL_BASED_OUTPATIENT_CLINIC_OR_DEPARTMENT_OTHER): Payer: Self-pay

## 2018-04-06 DIAGNOSIS — I110 Hypertensive heart disease with heart failure: Secondary | ICD-10-CM | POA: Insufficient documentation

## 2018-04-06 DIAGNOSIS — I509 Heart failure, unspecified: Secondary | ICD-10-CM | POA: Insufficient documentation

## 2018-04-06 DIAGNOSIS — E119 Type 2 diabetes mellitus without complications: Secondary | ICD-10-CM | POA: Insufficient documentation

## 2018-04-06 DIAGNOSIS — Z79899 Other long term (current) drug therapy: Secondary | ICD-10-CM | POA: Insufficient documentation

## 2018-04-06 DIAGNOSIS — R0789 Other chest pain: Secondary | ICD-10-CM | POA: Insufficient documentation

## 2018-04-06 LAB — BASIC METABOLIC PANEL
Anion gap: 9 (ref 5–15)
BUN: 22 mg/dL — AB (ref 6–20)
CHLORIDE: 103 mmol/L (ref 98–111)
CO2: 27 mmol/L (ref 22–32)
CREATININE: 1.09 mg/dL — AB (ref 0.44–1.00)
Calcium: 9.1 mg/dL (ref 8.9–10.3)
GFR calc Af Amer: >60 mL/min (ref >60)
GFR calc non Af Amer: 59 mL/min — ABNORMAL LOW (ref >60)
GLUCOSE: 138 mg/dL — AB (ref 70–99)
Potassium: 3.7 mmol/L (ref 3.5–5.1)
SODIUM: 139 mmol/L (ref 135–145)

## 2018-04-06 LAB — TROPONIN I
Troponin I: 0.03 ng/mL (ref <0.03)
Troponin I: 0.03 ng/mL (ref <0.03)

## 2018-04-06 LAB — CBC
HEMATOCRIT: 38.9 % (ref 36.0–46.0)
HEMOGLOBIN: 12.4 g/dL (ref 12.0–15.0)
MCH: 29.6 pg (ref 26.0–34.0)
MCHC: 31.9 g/dL (ref 30.0–36.0)
MCV: 92.8 fL (ref 80.0–100.0)
Platelets: 314 10*3/uL (ref 150–400)
RBC: 4.19 MIL/uL (ref 3.87–5.11)
RDW: 12.6 % (ref 11.5–15.5)
WBC: 6.4 10*3/uL (ref 4.0–10.5)
nRBC: 0 % (ref 0.0–0.2)

## 2018-04-06 LAB — BRAIN NATRIURETIC PEPTIDE: B NATRIURETIC PEPTIDE 5: 7.9 pg/mL (ref 0.0–100.0)

## 2018-04-06 NOTE — ED Triage Notes (Signed)
Patient states that she has had a SOB and chest pain starting at lunch time today - patient states that it hurts to take a deep breath

## 2018-04-06 NOTE — ED Notes (Signed)
Troponin 0.03, results given to primary RN and ED MD

## 2018-04-06 NOTE — ED Provider Notes (Signed)
[facility] Provider Note   CSN: [id number] Arrival date & time: 04/06/18  1607     History   Chief Complaint Chief Complaint  Patient presents with  . Shortness of Breath    HPI Linda Phelps is a 47 y.o. female.  The history is provided by the patient.  Chest Pain   This is a new problem. The current episode started yesterday. Episode frequency: intermittent. The problem has been gradually improving. The pain is associated with movement and raising an arm. The pain is present in the substernal region. The pain is at a severity of 2/10. The pain is mild. The quality of the pain is described as pressure-like. The pain does not radiate. Associated symptoms include shortness of breath. Pertinent negatives include no abdominal pain, no back pain, no claudication, no cough, no diaphoresis, no dizziness, no exertional chest pressure, no fever, no irregular heartbeat, no leg pain, no lower extremity edema, no nausea, no near-syncope, no numbness, no palpitations, no sputum production, no syncope and no vomiting. She has tried rest for the symptoms. The treatment provided mild relief.  Her past medical history is significant for diabetes, hyperlipidemia and hypertension.  Pertinent negatives for past medical history include no CAD, no PE and no seizures. Past medical history comments: CHF  Pertinent negatives for family medical history include: no CAD.  Procedure history is positive for echocardiogram.    Past Medical History:  Diagnosis Date  . CHF (congestive heart failure) ([place])   . Diabetes mellitus without complication ([place])   . Ectopic pregnancy   . High cholesterol   . Hypertension   . Low back pain   . Obesity   . Thyroid disease     Patient Active Problem List   Diagnosis Date Noted  . Atypical chest pain 11/18/2017  . Chest pain 11/18/2017  . Chronic right-sided low back pain without sciatica 07/11/2016  . Acute non intractable  tension-type headache 05/18/2016  . Abnormal chest x-ray 05/18/2016  . Vitamin D insufficiency 01/13/2016  . Menstrual cycle problem 01/11/2016  . Hordeolum externum (stye) 01/11/2016  . Obesity (BMI 30.0-34.9) 10/01/2015  . Chronic systolic heart failure ([place]) 09/21/2015  . History of noncompliance with medical treatment 09/21/2015  . Long-term use of aspirin therapy 09/21/2015  . 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 Palpitations 07/26/2015  . GERD (gastroesophageal reflux disease) 07/26/2015  . Generalized anxiety disorder 07/26/2015  . Poor dentition 04/13/2015  . Cardiomegaly 09/23/2014  . Diabetes mellitus type 2 in obese ([place]) 07/03/2014  . Essential hypertension 07/03/2014  . Hyperlipidemia 07/03/2014  . Hypothyroidism 07/03/2014    Past Surgical History:  Procedure Laterality Date  . APPENDECTOMY    . CHOLECYSTECTOMY       OB History   None      Home Medications    Prior to Admission medications   Medication Sig Start Date End Date Taking? Authorizing Provider  acetaminophen (TYLENOL) 500 MG tablet Take 2 tablets (1,000 mg total) by mouth every 6 (six) hours as needed for mild pain or moderate pain. 01/08/17   Alfonse Spruce, FNP  amLODipine (NORVASC) 10 MG tablet Take 1 tablet (10 mg total) by mouth daily. 03/29/18   Charlott Rakes, MD  aspirin 81 MG tablet Take 1 tablet (81 mg total) by mouth daily. 10/17/17   Argentina Donovan, PA-C  carvedilol (COREG) 12.5 MG tablet Take 1 tablet (12.5 mg total) by mouth 2 (two) times daily with a meal. 03/29/18  Charlott Rakes, MD  clonazePAM (KLONOPIN) 0.5 MG tablet Take 1 tablet (0.5 mg total) by mouth 2 (two) times daily as needed for anxiety. 11/19/17 11/19/18  Florencia Reasons, MD  cyclobenzaprine (FLEXERIL) 10 MG tablet Take 1 tablet (10 mg total) by mouth 2 (two) times daily as needed for muscle spasms. 10/31/17   Gildardo Pounds, NP  famotidine (PEPCID) 20 MG tablet Take ONE TABLET BY MOUTH TWICE DAILY 03/25/18    Charlott Rakes, MD  fluticasone (FLONASE) 50 MCG/ACT nasal spray Place 2 sprays into both nostrils daily. 10/08/17   Gildardo Pounds, NP  furosemide (LASIX) 20 MG tablet Take 1 tablet (20 mg total) by mouth 2 (two) times daily. 03/29/18   Charlott Rakes, MD  levothyroxine (SYNTHROID, LEVOTHROID) 50 MCG tablet Take 1 tablet (50 mcg total) by mouth daily. 03/29/18   Charlott Rakes, MD  lisinopril (PRINIVIL,ZESTRIL) 40 MG tablet Take 1 tablet (40 mg total) by mouth daily. 10/17/17   Argentina Donovan, PA-C  metFORMIN (GLUCOPHAGE) 1000 MG tablet Take 1 tablet (1,000 mg total) by mouth 2 (two) times daily with a meal. 10/17/17   McClung, Dionne Bucy, PA-C  ondansetron (ZOFRAN ODT) 8 MG disintegrating tablet Take 1 tablet (8 mg total) by mouth every 8 (eight) hours as needed for nausea or vomiting. 07/20/17   Molpus, John, MD  triamcinolone cream (KENALOG) 0.1 % Apply 1 application topically 2 (two) times daily. 10/17/17   Argentina Donovan, PA-C    Family History Family History  Problem Relation Age of Onset  . Diabetes Mother   . Diabetes Maternal Grandmother   . Colon cancer Maternal Uncle 46    Social History Social History   Tobacco Use  . Smoking status: Never Smoker  . Smokeless tobacco: Never Used  Substance Use Topics  . Alcohol use: Yes    Alcohol/week: 0.0 standard drinks    Comment: occ  . Drug use: No     Allergies   Ibuprofen   Review of Systems Review of Systems  Constitutional: Negative for chills, diaphoresis and fever.  HENT: Negative for ear pain and sore throat.   Eyes: Negative for pain and visual disturbance.  Respiratory: Positive for shortness of breath. Negative for cough and sputum production.   Cardiovascular: Positive for chest pain. Negative for palpitations, claudication, syncope and near-syncope.  Gastrointestinal: Negative for abdominal pain, nausea and vomiting.  Genitourinary: Negative for dysuria and hematuria.  Musculoskeletal: Negative for  arthralgias and back pain.  Skin: Negative for color change, rash and wound.  Neurological: Negative for dizziness, seizures, syncope and numbness.  All other systems reviewed and are negative.    Physical Exam Updated Vital Signs  ED Triage Vitals  Enc Vitals Group     BP 04/06/18 1614 (!) 164/86     Pulse Rate 04/06/18 1614 81     Resp 04/06/18 1614 18     Temp 04/06/18 1614 98.8 F (37.1 C)     Temp Source 04/06/18 1614 Oral     SpO2 04/06/18 1614 100 %     Weight 04/06/18 1612 190 lb (86.2 kg)     Height 04/06/18 1612 5\' 2"  (1.575 m)     Head Circumference --      Peak Flow --      Pain Score 04/06/18 1611 6     Pain Loc --      Pain Edu? --      Excl. in [place]? --     Physical Exam  Constitutional: She appears well-developed and well-nourished. No distress.  HENT:  Head: Normocephalic and atraumatic.  Mouth/Throat: Oropharynx is clear and moist.  Eyes: Pupils are equal, round, and reactive to light. Conjunctivae and EOM are normal.  Neck: Normal range of motion. Neck supple.  Cardiovascular: Normal rate and regular rhythm.  No murmur heard. Pulmonary/Chest: Effort normal and breath sounds normal. No respiratory distress. She has no decreased breath sounds. She has no wheezes. She has no rhonchi. She has no rales.  Abdominal: Soft. There is no tenderness.  Musculoskeletal: Normal range of motion. She exhibits no edema.       Right lower leg: She exhibits no edema.       Left lower leg: She exhibits no edema.  TTP to sternal area  Neurological: She is alert.  Skin: Skin is warm and dry.  Psychiatric: She has a normal mood and affect.  Nursing note and vitals reviewed.    ED Treatments / Results  Labs (all labs ordered are listed, but only abnormal results are displayed) Labs Reviewed  BASIC METABOLIC PANEL - Abnormal; Notable for the following components:      Result Value   Glucose, Bld 138 (*)    BUN 22 (*)    Creatinine, Ser 1.09 (*)    GFR calc non Af  Amer 59 (*)    All other components within normal limits  TROPONIN I - Abnormal; Notable for the following components:   Troponin I 0.03 (*)    All other components within normal limits  TROPONIN I - Abnormal; Notable for the following components:   Troponin I 0.03 (*)    All other components within normal limits  CBC  BRAIN NATRIURETIC PEPTIDE    EKG EKG Interpretation  Date/Time:  Saturday April 06 2018 16:18:32 EST Ventricular Rate:  74 PR Interval:  180 QRS Duration: 94 QT Interval:  386 QTC Calculation: 428 R Axis:   73 Text Interpretation:  Normal sinus rhythm Nonspecific T wave abnormality Abnormal ECG Reconfirmed by Lennice Sites [phone]) on 04/06/2018 5:52:44 PM   Radiology Dg Chest 2 View  Result Date: 04/06/2018 CLINICAL DATA:  Chest pain EXAM: CHEST - 2 VIEW COMPARISON:  01/23/2018 FINDINGS: Lungs are clear.  No pleural effusion or pneumothorax. The heart is normal in size. Visualized osseous structures are within normal limits. Cholecystectomy clips. IMPRESSION: Normal chest radiographs. Electronically Signed   By: Julian Hy M.D.   On: 04/06/2018 16:55    Procedures Procedures (including critical care time)  Medications Ordered in ED Medications - No data to display   Initial Impression / Assessment and Plan / ED Course  I have reviewed the triage vital signs and the nursing notes.  Pertinent labs & imaging results that were available during my care of the patient were reviewed by me and considered in my medical decision making (see chart for details).     Dulse Rutan is a 47 year old female history of heart failure, high cholesterol, hypertension, diabetes who presents to the ED with chest pain.  Patient with normal vitals.  No fever.  Patient states some sternal chest pain with movement of her left arm over the last 2 days.  Patient has increased her physical activity and did a lot of pushing work out yesterday.  Patient denies any shortness of  breath but states that pain is slightly worse when she takes a deep breath.  She has reproducible chest wall tenderness.  Denies any nausea, diaphoresis, abdominal pain.  Patient is [facility]  negative and doubt PE.  Exam is overall unremarkable.  No signs of volume overload.  Clear breath sounds bilaterally.  EKG shows sinus rhythm.  No new ischemic changes.  No ST changes.  Some nonspecific T wave changes.  Patient with chest x-ray that showed no pneumonia, no pneumothorax, no pleural effusion.  BNP within normal limits.  No significant anemia, electrolyte abnormality, kidney injury.  Troponin at 0.03 at the cutoff for normal.  Contacted Dr. De Nurse with cardiology and after review of EKG and patient's history and physical he states getting a delta troponin and if troponin is unchanged okay for discharge.  Unlikely cardiac pain.  EKG reassuring.  History and physical are reassuring as well.  Recommend follow-up with cardiology outpatient.    Repeat troponin was identical after 4 hours.  Doubt cardiac chest pain.  Likely musculoskeletal chest pain.  Patient has had elevated troponins at this level in the past.  Recommend outpatient follow-up with cardiology and given return precautions.  Doubt ACS, no concern for PE.  Will recommend Tylenol Motrin for pain.  Recommend rest as well.  Discharged in good condition. No pain at discharge.  This chart was dictated using voice recognition software.  Despite best efforts to proofread,  errors can occur which can change the documentation meaning.   Final Clinical Impressions(s) / ED Diagnoses   Final diagnoses:  Atypical chest pain    ED Discharge Orders    None       Lennice Sites, DO 04/06/18 2151

## 2018-04-09 ENCOUNTER — Ambulatory Visit: Payer: Self-pay | Admitting: Nurse Practitioner

## 2018-04-23 ENCOUNTER — Telehealth: Payer: Self-pay | Admitting: *Deleted

## 2018-04-23 NOTE — Telephone Encounter (Signed)
Spoke with [employer] today about pt's monitor put on in July, 2019. Her case has been turned over to the monitor recovery dept. Monitor was never mailed back. Have tried nurmerous times to contact patient with no success. Will mark report as suspended and cease all work on this monitor.

## 2018-04-26 ENCOUNTER — Ambulatory Visit: Payer: Self-pay | Admitting: Nurse Practitioner

## 2018-05-06 IMAGING — CR DG CHEST 2V
2 series · 2 of 2 positions shown · non-contrast
Comparison: PA and lateral chest x-ray July 18, 2015

CLINICAL DATA: Chest pain for the past day. History of
gastroesophageal reflux, hypertension, hyperlipidemia, diabetes.

EXAM:
CHEST  2 VIEW

[w chest pa]
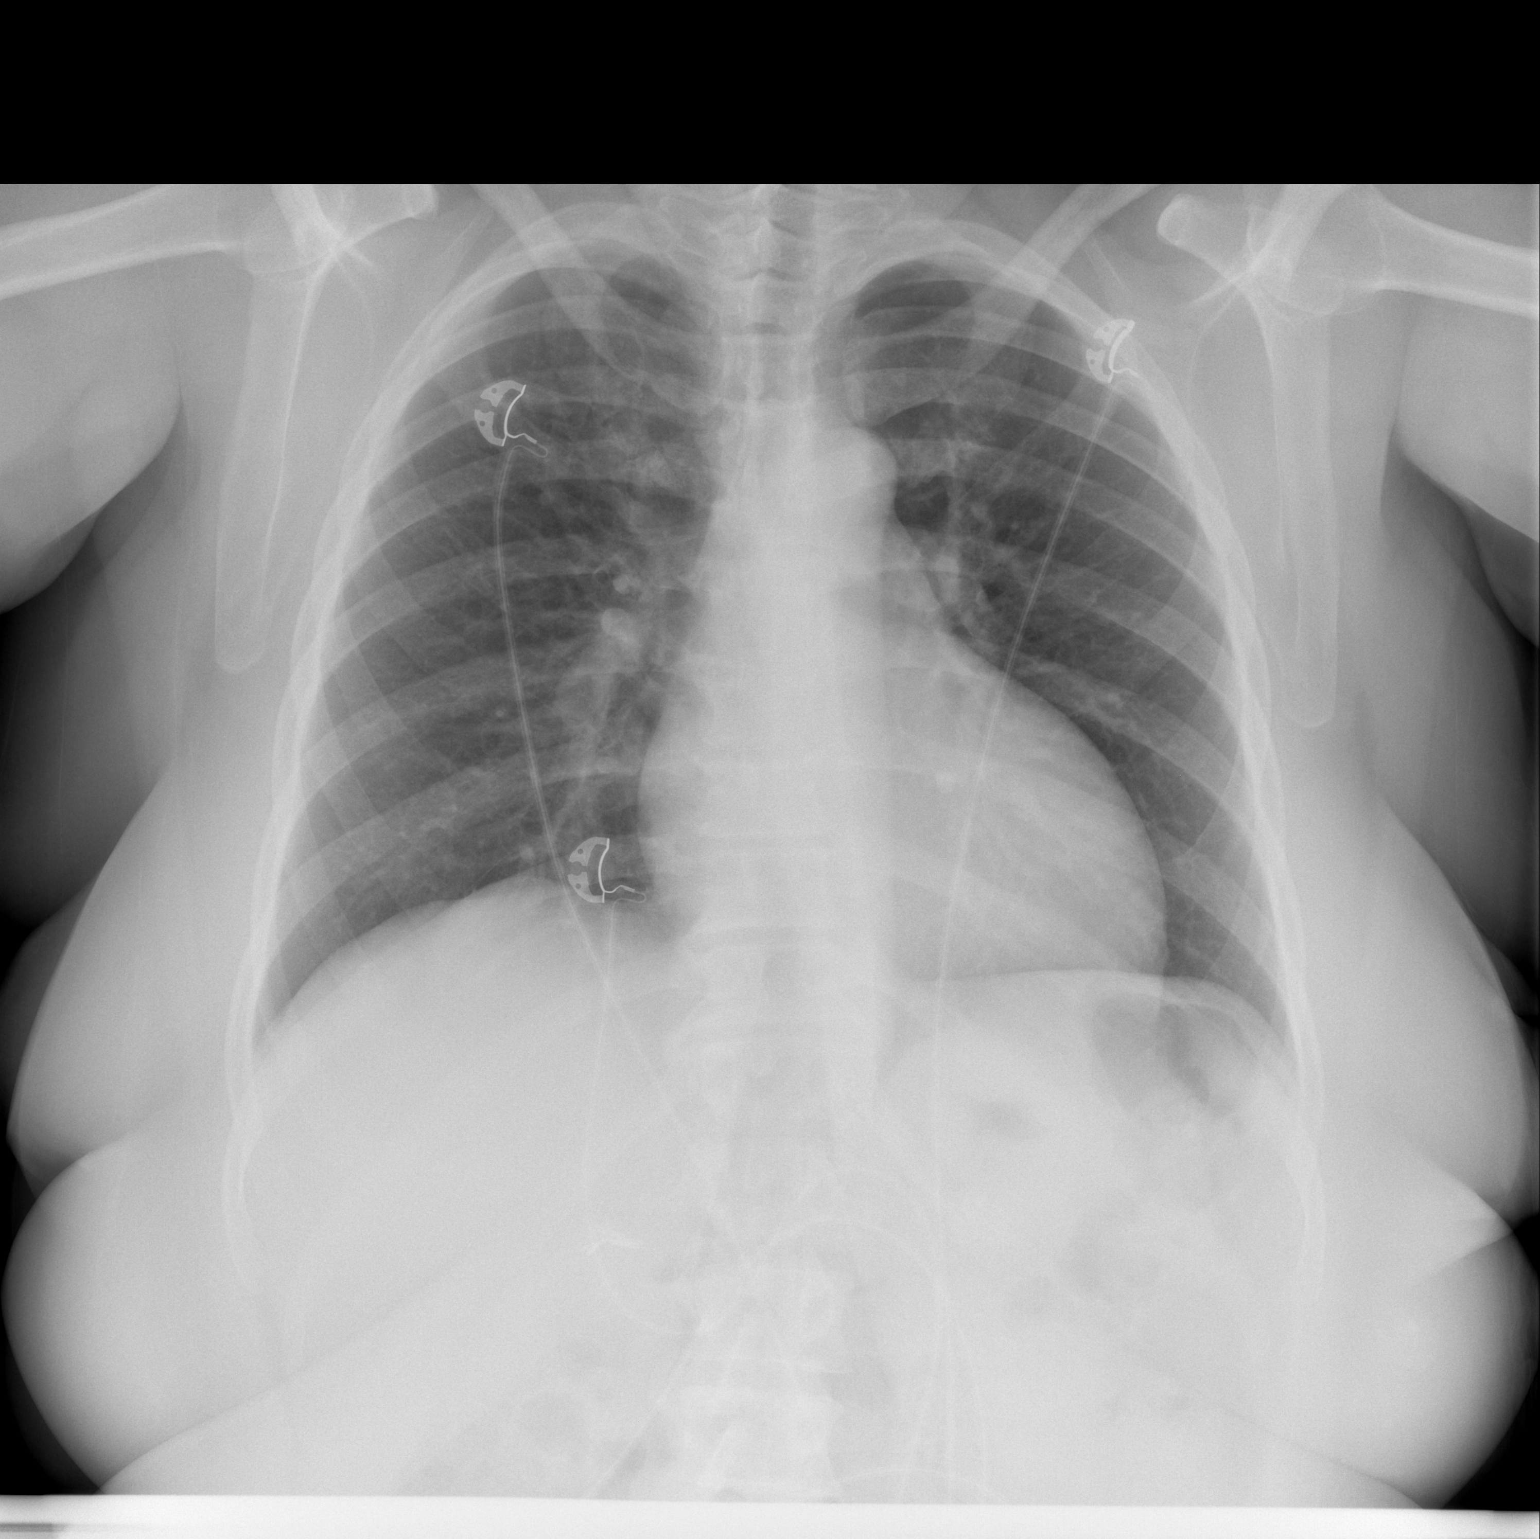

[w chest lat]
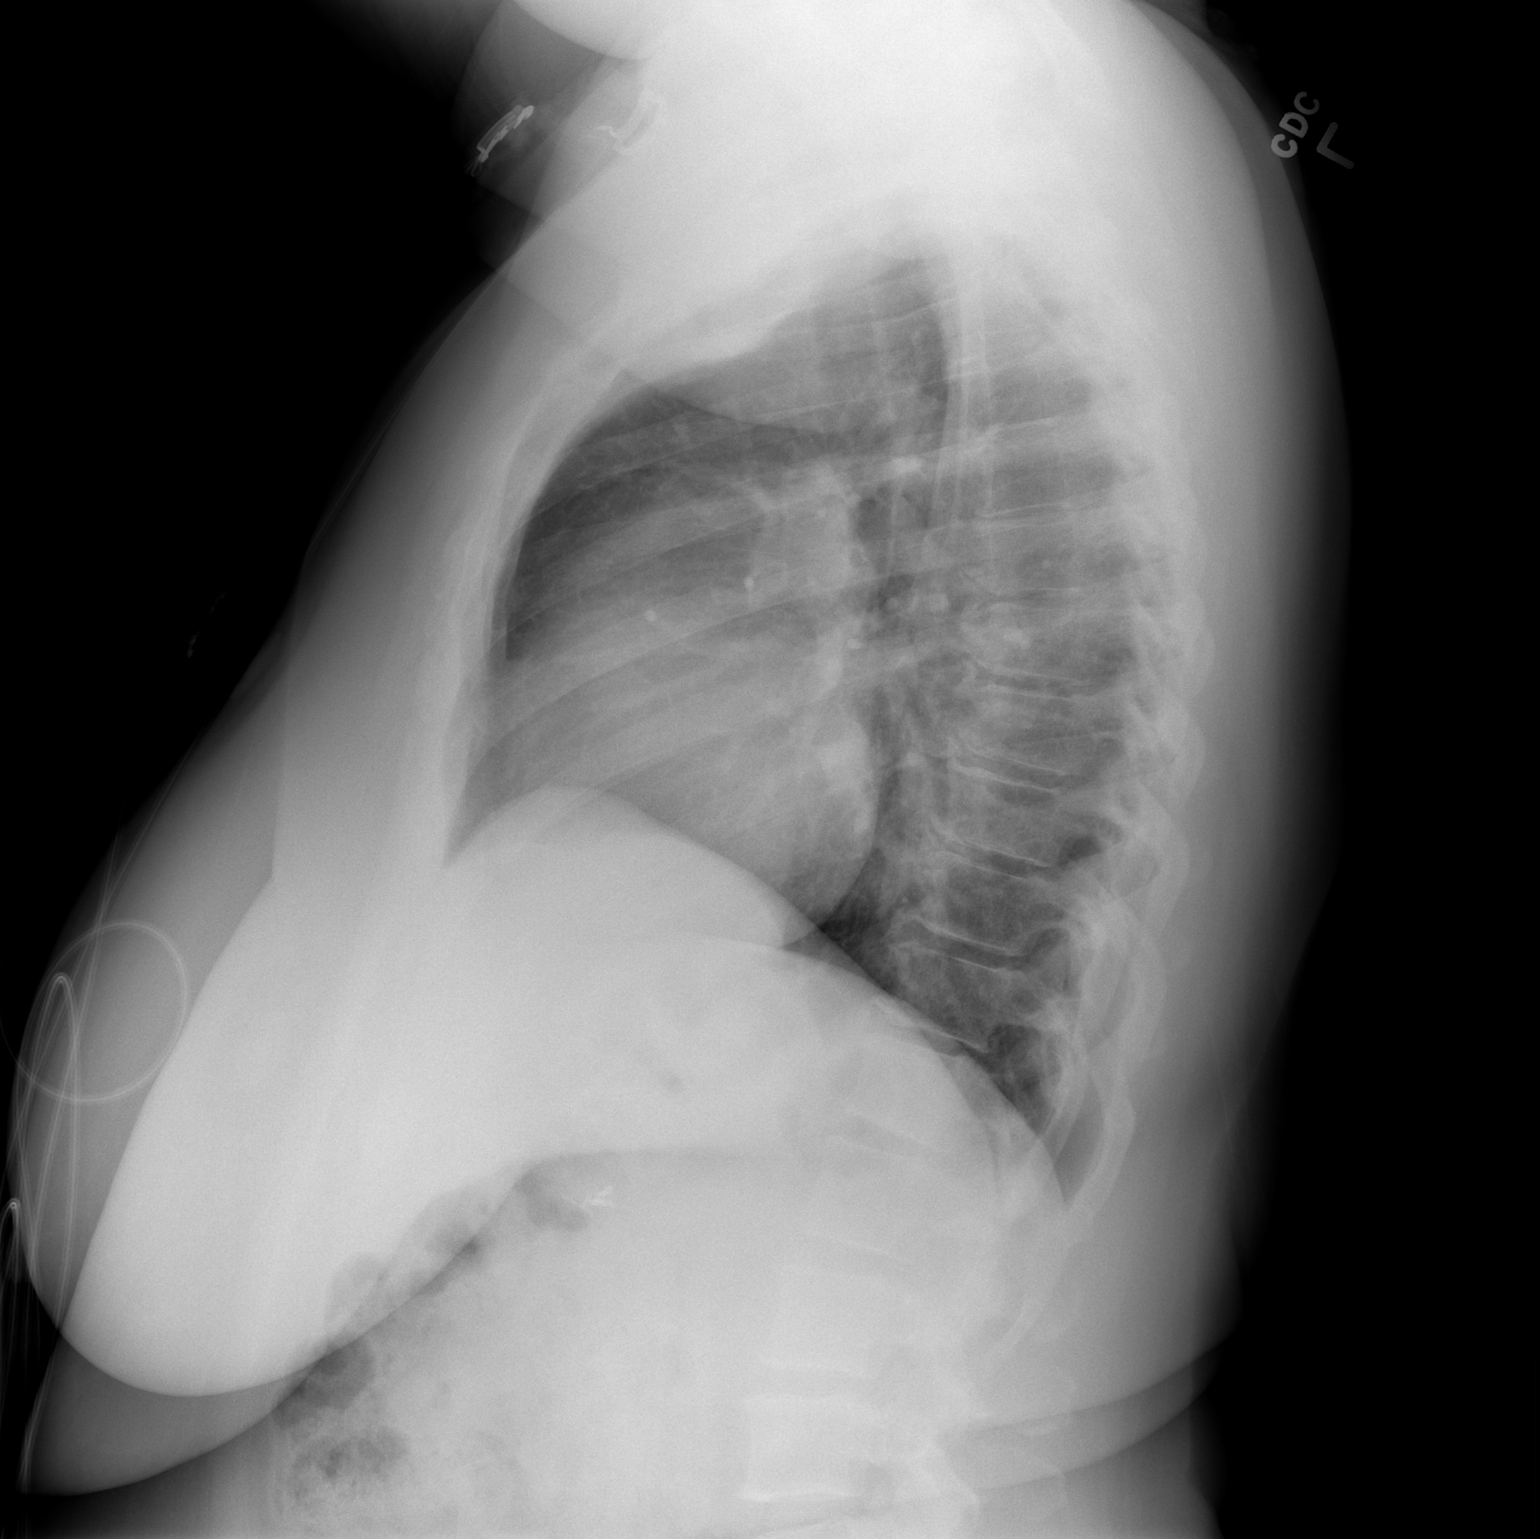

[2 of 2 positions shown; findings below may reference images not displayed]

FINDINGS: The lungs are adequately inflated. The lung markings are coarse in
the right upper lobe. There is no pleural effusion or pneumothorax.
The heart and pulmonary vascularity are normal. The mediastinum is
normal in width.
IMPRESSION: Subtle increased density in the right suprahilar region may reflect
atelectasis or early pneumonia. Followup PA and lateral chest X-ray
is recommended in 3-4 weeks following trial of antibiotic therapy to
ensure resolution and exclude underlying malignancy.

## 2018-05-10 ENCOUNTER — Encounter

## 2018-05-10 ENCOUNTER — Ambulatory Visit: Payer: Self-pay | Admitting: Cardiology

## 2018-05-10 ENCOUNTER — Other Ambulatory Visit: Payer: Self-pay | Admitting: Nurse Practitioner

## 2018-05-10 DIAGNOSIS — K219 Gastro-esophageal reflux disease without esophagitis: Secondary | ICD-10-CM

## 2018-05-10 DIAGNOSIS — R0989 Other specified symptoms and signs involving the circulatory and respiratory systems: Secondary | ICD-10-CM

## 2018-06-06 ENCOUNTER — Ambulatory Visit: Payer: Self-pay | Admitting: Cardiology

## 2018-06-12 ENCOUNTER — Ambulatory Visit: Payer: Self-pay | Admitting: Family Medicine

## 2018-06-17 ENCOUNTER — Ambulatory Visit: Payer: Self-pay | Admitting: Cardiology

## 2018-07-03 ENCOUNTER — Ambulatory Visit: Payer: Self-pay | Attending: Family Medicine | Admitting: Nurse Practitioner

## 2018-07-03 ENCOUNTER — Encounter: Payer: Self-pay | Admitting: Nurse Practitioner

## 2018-07-03 VITALS — BP 146/93 | HR 78 | Temp 98.4°F | Ht 61.0 in | Wt 198.4 lb

## 2018-07-03 DIAGNOSIS — F419 Anxiety disorder, unspecified: Secondary | ICD-10-CM

## 2018-07-03 DIAGNOSIS — E039 Hypothyroidism, unspecified: Secondary | ICD-10-CM

## 2018-07-03 DIAGNOSIS — G8929 Other chronic pain: Secondary | ICD-10-CM

## 2018-07-03 DIAGNOSIS — I10 Essential (primary) hypertension: Secondary | ICD-10-CM

## 2018-07-03 DIAGNOSIS — E1121 Type 2 diabetes mellitus with diabetic nephropathy: Secondary | ICD-10-CM

## 2018-07-03 DIAGNOSIS — K219 Gastro-esophageal reflux disease without esophagitis: Secondary | ICD-10-CM

## 2018-07-03 DIAGNOSIS — M546 Pain in thoracic spine: Secondary | ICD-10-CM

## 2018-07-03 LAB — POCT GLYCOSYLATED HEMOGLOBIN (HGB A1C): Hemoglobin A1C: 6.6 % — AB (ref 4.0–5.6)

## 2018-07-03 LAB — GLUCOSE, POCT (MANUAL RESULT ENTRY): POC Glucose: 100 mg/dl — AB (ref 70–99)

## 2018-07-03 MED ORDER — TRUE METRIX METER W/DEVICE KIT: PACK | 0 refills | Status: DC

## 2018-07-03 MED ORDER — PANTOPRAZOLE SODIUM 40 MG PO TBEC: 40.0000 mg | DELAYED_RELEASE_TABLET | Freq: Every day | ORAL | 0 refills | Status: DC

## 2018-07-03 MED ORDER — TRUEPLUS LANCETS 28G MISC: 3 refills | Status: DC

## 2018-07-03 MED ORDER — LEVOTHYROXINE SODIUM 50 MCG PO TABS: 50.0000 ug | ORAL_TABLET | Freq: Every day | ORAL | 0 refills | Status: DC

## 2018-07-03 MED ORDER — CYCLOBENZAPRINE HCL 10 MG PO TABS: 10.0000 mg | ORAL_TABLET | Freq: Two times a day (BID) | ORAL | 3 refills | Status: AC | PRN

## 2018-07-03 MED ORDER — AMLODIPINE BESYLATE 10 MG PO TABS: 10.0000 mg | ORAL_TABLET | Freq: Every day | ORAL | 0 refills | Status: DC

## 2018-07-03 MED ORDER — GLUCOSE BLOOD VI STRP: ORAL_STRIP | 12 refills | Status: DC

## 2018-07-03 MED ORDER — LISINOPRIL 40 MG PO TABS: 40.0000 mg | ORAL_TABLET | Freq: Every day | ORAL | 3 refills | Status: DC

## 2018-07-03 MED ORDER — FUROSEMIDE 20 MG PO TABS: 20.0000 mg | ORAL_TABLET | Freq: Two times a day (BID) | ORAL | 2 refills | Status: DC

## 2018-07-03 MED ORDER — HYDROXYZINE HCL 50 MG PO TABS: 50.0000 mg | ORAL_TABLET | Freq: Three times a day (TID) | ORAL | 1 refills | Status: AC | PRN

## 2018-07-03 MED ORDER — CARVEDILOL 12.5 MG PO TABS: 12.5000 mg | ORAL_TABLET | Freq: Two times a day (BID) | ORAL | 2 refills | Status: DC

## 2018-07-03 NOTE — Patient Instructions (Signed)
Living With Heart Failure   Heart failure is a long-term (chronic) condition in which the heart cannot pump enough blood through the body. When this happens, parts of the body do not get the blood and oxygen they need.  There is no cure for heart failure at this time, so it is important for you to take good care of yourself and follow the treatment plan set by your health care provider. If you are living with heart failure, there are ways to help you manage the disease.  Follow these instructions at home:  Living with heart failure requires you to make changes in your life. Your health care team will teach you about the changes you need to make in order to relieve your symptoms and lower your risk of going to the hospital. Follow the treatment plan as set by your health care provider.  Medicines  Medicines are important in reducing your heart's workload, slowing the progression of heart failure, and improving your symptoms.  · Take over-the-counter and prescription medicines only as told by your health care provider.  · Do not stop taking your medicine unless your health care provider tells you to do that.  · Do not skip any dose of your medicine.  · Refill prescriptions before you run out of medicine. You need your medicines every day.  Eating and drinking    · Eat heart-healthy foods. Talk with a dietitian to make an eating plan that is right for you.  ? If directed by your health care provider:  ? Limit salt (sodium). Lowering your sodium intake may reduce symptoms of heart failure. Ask a dietitian to recommend heart-healthy seasonings.  ? Limit your fluid intake. Fluid restriction may reduce symptoms of heart failure.  ? Use low-fat cooking methods instead of frying. Low-fat methods include roasting, grilling, broiling, baking, poaching, steaming, and stir-frying.  ? Choose foods that contain no trans fat and are low in saturated fat and cholesterol. Healthy choices include fresh or frozen fruits and vegetables,  fish, lean meats, legumes, fat-free or low-fat dairy products, and whole-grain or high-fiber foods.  · Limit alcohol intake to no more than 1 drink a day for nonpregnant women and 2 drinks a day for men. One drink equals 12 oz of beer, 5 oz of wine, or 1½ oz of hard liquor.  ? Drinking more than that is harmful to your heart. Tell your health care provider if you drink alcohol several times a week.  ? Talk with your health care provider about whether any level of alcohol use is safe for you.  Activity    · Ask your health care provider about attending cardiac rehabilitation. These programs include aerobic physical activity, which provides many benefits for your heart.  · If no cardiac rehabilitation program is available, ask your health care provider what aerobic exercises are safe for you to do.  Lifestyle  Make the lifestyle changes recommended by your health care provider. In general:  · Lose weight if your health care provider tells you to do that. Weight loss may reduce symptoms of heart failure.  · Do not use any products that contain nicotine or tobacco, such as cigarettes or e-cigarettes. If you need help quitting, ask your health care provider.  · Do not use street (illegal) drugs.  · Return to your normal activities as told by your health care provider. Ask your health care provider what activities are safe for you.  General instructions    · Make sure   you weigh yourself every day to track your weight. Rapid weight gain may indicate an increase in fluid in your body and may increase the workload of your heart.  ? Weigh yourself every morning. Do this after you urinate but before you eat breakfast.  ? Wear the same type of clothing, without shoes, each time you weigh yourself.  ? Weigh yourself on the same scale and in the same spot each time.  · Living with chronic heart failure often leads to emotions such as fear, stress, anxiety, and depression. If you feel any of these emotions and need help coping,  contact your health care provider. Other ways to get help include:  ? Talking to friends and family members about your condition. They can give you support and guidance. Explain your symptoms to them and, if comfortable, invite them to attend appointments or rehabilitation with you.  ? Joining a support group for people with chronic heart failure. Talking with other people who have the same symptoms may give you new ways of coping with your disease and your emotions.  · Stay up to date with your shots (vaccines). Staying current on pneumococcal and influenza vaccines is especially important in preventing germs from attacking your airways (respiratory infections).  · Keep all follow-up visits as told by your health care provider. This is important.  How to recognize changes in your condition  You and your family members need to know what changes to watch for in your condition.  Watch for the following changes and report them to your health care provider:  · Sudden weight gain. Ask your health care provider what amount of weight gain to report.  · Shortness of breath:  ? Feeling short of breath while at rest, with no exercise or activity that required great effort.  ? Feeling breathless with activity.  · Swelling of your lower legs or ankles.  · Difficulty sleeping:  ? You wake up feeling short of breath.  ? You have to use more pillows to raise your head in order to sleep.  · Frequent, dry, hacking cough.  · Loss of appetite.  · Feeling more tired all the time.  · Depression or feelings of sadness or hopelessness.  · Bloating in the stomach.  Where to find more information  · Local support groups. Ask your health care provider about groups near you.  · The American Heart Association: www.heart.org  Contact a health care provider if:  · You have a rapid weight gain.  · You have increasing shortness of breath that is unusual for you.  · You are unable to participate in your usual physical activities.  · You tire  easily.  · You cough more than normal, especially with physical activity.  · You have any swelling or more swelling in areas such as your hands, feet, ankles, or abdomen.  · You feel like your heart is beating quickly (palpitations).  · You become dizzy or light-headed when you stand up.  Get help right away if:  · You have difficulty breathing.  · You notice or your family notices a change in your awareness, such as having trouble staying awake or having difficulty with concentration.  · You have pain or discomfort in your chest.  · You have an episode of fainting (syncope).  Summary  · There is no cure for heart failure, so it is important for you to take good care of yourself and follow the treatment plan set by your health care   provider.  · Medicines are important in reducing your heart's workload, slowing the progression of heart failure, and improving your symptoms.  · Living with chronic heart failure often leads to emotions such as fear, stress, anxiety, and depression. If you are feeling any of these emotions and need help coping, contact your health care provider.  This information is not intended to replace advice given to you by your health care provider. Make sure you discuss any questions you have with your health care provider.  Document Released: 09/20/2016 Document Revised: 09/20/2016 Document Reviewed: 09/20/2016  [employer]vier Interactive Patient Education © 2019 [employer]

## 2018-07-03 NOTE — Progress Notes (Signed)
Assessment & Plan:  Linda Phelps was seen today for establish care.  Diagnoses and all orders for this visit:  Controlled type 2 diabetes mellitus with diabetic nephropathy, without long-term current use of insulin (HCC) -     Glucose (CBG) -     HgB A1c -     Microalbumin/Creatinine Ratio, Urine -     Blood Glucose Monitoring Suppl (TRUE METRIX METER) w/Device KIT; Use as instructed. Please monitor blood glucose levels fasting and 1 hour after lunch and dinner. -     glucose blood (TRUE METRIX BLOOD GLUCOSE TEST) test strip; Use as instructed. Please monitor blood glucose levels fasting and 1 hour after lunch and dinner. -     TRUEPLUS LANCETS 28G MISC; Use as instructed. Please monitor blood glucose levels fasting and 1 hour after lunch and dinner.  Continue blood sugar control as discussed in office today, low carbohydrate diet, and regular physical exercise as tolerated, 150 minutes per week (30 min each day, 5 days per week, or 50 min 3 days per week). Keep blood sugar logs with fasting goal of 90-130 mg/dl, post prandial (after you eat) less than 180.  For Hypoglycemia: BS <60 and Hyperglycemia BS >400; contact the clinic ASAP. Annual eye exams and foot exams are recommended.   Essential hypertension -     CMP14+EGFR -     amLODipine (NORVASC) 10 MG tablet; Take 1 tablet (10 mg total) by mouth daily. -     carvedilol (COREG) 12.5 MG tablet; Take 1 tablet (12.5 mg total) by mouth 2 (two) times daily with a meal. -     furosemide (LASIX) 20 MG tablet; Take 1 tablet (20 mg total) by mouth 2 (two) times daily. -     lisinopril (PRINIVIL,ZESTRIL) 40 MG tablet; Take 1 tablet (40 mg total) by mouth daily.  Continue all antihypertensives as prescribed.  Remember to bring in your blood pressure log with you for your follow up appointment.  DASH/Mediterranean Diets are healthier choices for HTN.    Hypothyroidism, unspecified type -     levothyroxine (SYNTHROID, LEVOTHROID) 50 MCG tablet; Take  1 tablet (50 mcg total) by mouth daily. -     TSH Lab Results  Component Value Date   TSH 1.410 07/03/2018    Gastroesophageal reflux disease, esophagitis presence not specified -     pantoprazole (PROTONIX) 40 MG tablet; Take 1 tablet (40 mg total) by mouth daily. INSTRUCTIONS: Avoid GERD Triggers: acidic, spicy or fried foods, caffeine, coffee, sodas,  alcohol and chocolate.   Anxiety -     hydrOXYzine (ATARAX/VISTARIL) 50 MG tablet; Take 1 tablet (50 mg total) by mouth 3 (three) times daily as needed for up to 30 days.  Chronic right-sided thoracic back pain -     cyclobenzaprine (FLEXERIL) 10 MG tablet; Take 1 tablet (10 mg total) by mouth 2 (two) times daily as needed for up to 30 days for muscle spasms. Work on losing weight to help reduce back pain. May alternate with heat and ice application for pain relief. May also alternate with acetaminophen as prescribed for back pain. Other alternatives include massage, acupuncture and water aerobics.  You must stay active and avoid a sedentary lifestyle.      Patient has been counseled on age-appropriate routine health concerns for screening and prevention. These are reviewed and up-to-date. Referrals have been placed accordingly. Immunizations are up-to-date or declined.    Subjective:   Chief Complaint  Patient presents with  . Establish Care  Pt. stated her back hurts, and she stated she think she sometimes would have palpitations.    HPI Linda Phelps 48 y.o. female presents to office today to establish care. She has a PMH of Grade 1 diastolic dysfunction, HTN, DM Type 2, HPL, Hypothyroidism, Anxiety, Chronic back pain and GERD.    DM TYPE 2 Improved. A1c down from 7.3 to 6.6. She has been cutting back on sugars. Taking metformin 1000 mg BID. Not monitoring blood glucose levels at home daily. She denies any hypo or hyperglycemic symptoms. Overdue for eye exam. Patient has been advised to apply for financial assistance and  schedule to see our financial counselor.  Lab Results  Component Value Date   HGBA1C 6.6 (A) 07/03/2018    Essential Hypertension Not well controlled. She has not been taking her blood pressure medications as prescribed. Current medications include amlodipine 17m, lisinopril 40 mg daily, carvedilol 12.555mBID.  Denies chest pain, shortness of breath, palpitations, lightheadedness, dizziness, headaches.  BP Readings from Last 3 Encounters:  07/03/18 (!) 146/93  04/06/18 140/71  12/21/17 (!) 150/87   Anxiety Chronic and well controlled. Taking hydroxyzine 5028mID.  GAD 7 : Generalized Anxiety Score 07/03/2018 11/29/2017 10/17/2017 05/09/2017  Nervous, Anxious, on Edge 0 0 0 0  Control/stop worrying 0 0 0 0  Worry too much - different things 0 0 0 0  Trouble relaxing 0 2 0 0  Restless 0 0 0 0  Easily annoyed or irritable 0 2 1 0  Afraid - awful might happen 0 0 1 0  Total GAD 7 Score 0 4 2 0   Chronic Right sided thoracic back pain Controlled with prn use of flexeril. Pain described as sharp cramping spasms. She denies any injury or trauma. Pain sometimes radiates to the front abdomen but does not radiate down into the hips, legs or knees. Aggravating factors: Bending, twisting.     Review of Systems  Constitutional: Negative for fever, malaise/fatigue and weight loss.  HENT: Negative.  Negative for nosebleeds.   Eyes: Negative.  Negative for blurred vision, double vision and photophobia.  Respiratory: Negative.  Negative for cough and shortness of breath.   Cardiovascular: Positive for leg swelling (chronic, intermittent). Negative for chest pain and palpitations.  Gastrointestinal: Positive for heartburn. Negative for nausea and vomiting.  Musculoskeletal: Positive for back pain. Negative for myalgias.  Neurological: Negative.  Negative for dizziness, focal weakness, seizures and headaches.  Psychiatric/Behavioral: Negative for suicidal ideas. The patient is nervous/anxious.      Past Medical History:  Diagnosis Date  . [place]estive heart failure[place] . Diabetes mellitus without complication ([facility] . Ectopic pregnancy   . High cholesterol   . Hypertension   . Low back pain   . Obesity   . Thyroid disease     Past Surgical History:  Procedure Laterality Date  . APPENDECTOMY    . CHOLECYSTECTOMY      Family History  Problem Relation Age of Onset  . Diabetes Mother   . Diabetes Maternal Grandmother   . Colon cancer Maternal Uncle 65 77 Social History Reviewed with no changes to be made today.   Outpatient Medications Prior to Visit  Medication Sig Dispense Refill  . aspirin 81 MG tablet Take 1 tablet (81 mg total) by mouth daily. 90 tablet 6  . fluticasone (FLONASE) 50 MCG/ACT nasal spray Place 2 sprays into both nostrils daily. 16 g 6  . metFORMIN (GLUCOPHAGE) 1000 MG tablet  Take 1 tablet (1,000 mg total) by mouth 2 (two) times daily with a meal. 180 tablet 3  . ondansetron (ZOFRAN ODT) 8 MG disintegrating tablet Take 1 tablet (8 mg total) by mouth every 8 (eight) hours as needed for nausea or vomiting. 10 tablet 0  . amLODipine (NORVASC) 10 MG tablet Take 1 tablet (10 mg total) by mouth daily. 30 tablet 0  . carvedilol (COREG) 12.5 MG tablet Take 1 tablet (12.5 mg total) by mouth 2 (two) times daily with a meal. 60 tablet 0  . clonazePAM (KLONOPIN) 0.5 MG tablet Take 1 tablet (0.5 mg total) by mouth 2 (two) times daily as needed for anxiety. 5 tablet 0  . cyclobenzaprine (FLEXERIL) 10 MG tablet Take 1 tablet (10 mg total) by mouth 2 (two) times daily as needed for muscle spasms. 20 tablet 0  . famotidine (PEPCID) 20 MG tablet Take ONE TABLET BY MOUTH TWICE DAILY 60 tablet 2  . furosemide (LASIX) 20 MG tablet Take 1 tablet (20 mg total) by mouth 2 (two) times daily. 60 tablet 0  . levothyroxine (SYNTHROID, LEVOTHROID) 50 MCG tablet Take 1 tablet (50 mcg total) by mouth daily. 30 tablet 0  . lisinopril (PRINIVIL,ZESTRIL) 40 MG tablet Take 1  tablet (40 mg total) by mouth daily. 90 tablet 3  . acetaminophen (TYLENOL) 500 MG tablet Take 2 tablets (1,000 mg total) by mouth every 6 (six) hours as needed for mild pain or moderate pain. (Patient not taking: Reported on 07/03/2018) 30 tablet 0  . triamcinolone cream (KENALOG) 0.1 % Apply 1 application topically 2 (two) times daily. (Patient not taking: Reported on 07/03/2018) 30 g 3   No facility-administered medications prior to visit.     Allergies  Allergen Reactions  . Ibuprofen Swelling       Objective:    BP (!) 146/93 (BP Location: Left Arm, Patient Position: Sitting, Cuff Size: Large)   Pulse 78   Temp 98.4 F (36.9 C) (Oral)   Ht '5\' 1"'$  (1.549 m)   Wt 198 lb 6.4 oz (90 kg)   LMP 10/08/2013   SpO2 95%   BMI 37.49 kg/m  Wt Readings from Last 3 Encounters:  07/03/18 198 lb 6.4 oz (90 kg)  04/06/18 190 lb (86.2 kg)  12/20/17 205 lb (93 kg)    Physical Exam Vitals signs and nursing note reviewed.  Constitutional:      Appearance: She is well-developed.  HENT:     Head: Normocephalic and atraumatic.  Neck:     Musculoskeletal: Normal range of motion.  Cardiovascular:     Rate and Rhythm: Normal rate and regular rhythm.     Heart sounds: Normal heart sounds. No murmur. No friction rub. No gallop.   Pulmonary:     Effort: Pulmonary effort is normal. No tachypnea or respiratory distress.     Breath sounds: Normal breath sounds. No decreased breath sounds, wheezing, rhonchi or rales.  Chest:     Chest wall: No tenderness.  Abdominal:     General: Bowel sounds are normal.     Palpations: Abdomen is soft.  Musculoskeletal: Normal range of motion.     Thoracic back: Normal.     Lumbar back: Normal.  Skin:    General: Skin is warm and dry.  Neurological:     Mental Status: She is alert and oriented to person, place, and time.     Coordination: Coordination normal.  Psychiatric:        Behavior: Behavior normal. Behavior  is cooperative.        Thought Content:  Thought content normal.        Judgment: Judgment normal.         Patient has been counseled extensively about nutrition and exercise as well as the importance of adherence with medications and regular follow-up. The patient was given clear instructions to go to ER or return to medical center if symptoms don't improve, worsen or new problems develop. The patient verbalized understanding.   Follow-up: Return for pap smear.   Gildardo Pounds, FNP-BC [facility] [place]   07/06/2018, 5:40 PM

## 2018-07-04 LAB — CMP14+EGFR
ALBUMIN: 4 g/dL (ref 3.8–4.8)
ALT: 12 IU/L (ref 0–32)
AST: 16 IU/L (ref 0–40)
Albumin/Globulin Ratio: 1.5 (ref 1.2–2.2)
Alkaline Phosphatase: 57 IU/L (ref 39–117)
BUN / CREAT RATIO: 12 (ref 9–23)
BUN: 15 mg/dL (ref 6–24)
Bilirubin Total: 0.5 mg/dL (ref 0.0–1.2)
CALCIUM: 9.5 mg/dL (ref 8.7–10.2)
CO2: 22 mmol/L (ref 20–29)
Chloride: 105 mmol/L (ref 96–106)
Creatinine, Ser: 1.23 mg/dL — ABNORMAL HIGH (ref 0.57–1.00)
GFR calc Af Amer: 60 mL/min/{1.73_m2} (ref >59)
GFR, EST NON AFRICAN AMERICAN: 52 mL/min/{1.73_m2} — AB (ref >59)
GLOBULIN, TOTAL: 2.7 g/dL (ref 1.5–4.5)
Glucose: 92 mg/dL (ref 65–99)
Potassium: 4.7 mmol/L (ref 3.5–5.2)
SODIUM: 144 mmol/L (ref 134–144)
Total Protein: 6.7 g/dL (ref 6.0–8.5)

## 2018-07-04 LAB — MICROALBUMIN / CREATININE URINE RATIO
Creatinine, Urine: 80.1 mg/dL
Microalb/Creat Ratio: 1611 mg/g creat — ABNORMAL HIGH (ref 0–29)
Microalbumin, Urine: 1290.3 ug/mL

## 2018-07-04 LAB — TSH: TSH: 1.41 u[IU]/mL (ref 0.450–4.500)

## 2018-07-06 ENCOUNTER — Encounter: Payer: Self-pay | Admitting: Nurse Practitioner

## 2018-07-06 MED ORDER — LEVOTHYROXINE SODIUM 50 MCG PO TABS: 50.0000 ug | ORAL_TABLET | Freq: Every day | ORAL | 2 refills | Status: DC

## 2018-07-08 ENCOUNTER — Telehealth: Payer: Self-pay

## 2018-07-08 NOTE — Telephone Encounter (Signed)
CMA spoke to patient to inform on results and PCP advising.  Pt. Verified DOB. Pt. Understood.

## 2018-07-08 NOTE — Telephone Encounter (Signed)
-----   Message from Gildardo Pounds, NP sent at 07/06/2018  6:02 PM EST ----- Thyroid level is elevated. Kidney function is elevated. Make sure you are not taking ibuprofen, aleve, advil or motrin. Will recheck at your next office visit. If still elevated at that time will need to stop metformin. Also your urine shows that your kidneys are being affected by your diabetes. Make sure you are taking your lisinopril as prescribed as this lowers your blood pressure and helps to protect your kidneys.

## 2018-07-16 ENCOUNTER — Other Ambulatory Visit: Payer: Self-pay | Admitting: Nurse Practitioner

## 2018-07-16 DIAGNOSIS — Z1231 Encounter for screening mammogram for malignant neoplasm of breast: Secondary | ICD-10-CM

## 2018-08-13 ENCOUNTER — Telehealth: Payer: Self-pay

## 2018-08-13 NOTE — Telephone Encounter (Signed)
Patient would like to know if Provider still want to check her kidney function and have her come in for lab work.

## 2018-08-13 NOTE — Telephone Encounter (Signed)
Yes please have her make lab appointment.

## 2018-08-14 ENCOUNTER — Other Ambulatory Visit: Payer: Self-pay | Admitting: Nurse Practitioner

## 2018-08-14 NOTE — Telephone Encounter (Signed)
CMA attempt tor reach patient to schedule a lab visit. No answer and unable to leave a VM due to no mailbox set up.

## 2018-09-09 ENCOUNTER — Telehealth: Payer: Self-pay | Admitting: Nurse Practitioner

## 2018-09-09 NOTE — Telephone Encounter (Signed)
Patient called because she states she has covid symptoms. Patient states she has a fever and cough but not a dry cough. Patient states at times she feels a heavy chest. Please follow up

## 2018-09-09 NOTE — Telephone Encounter (Signed)
Patient called in for Covid-19 advise.  Patient was identified by name and date of birth.    Patient states that she has had a fever all weekend, with the highest temp of 100.9.  Patient endorses a productive cough.  Patient endorses shortness of breath earlier in the weekend.  Patient has not had contact with a positive COVID-19 person.  Patient was informed of COVID-19 symptoms.  Patient informed she meets standards for COVID-19.  Patient given precautions.    Patient advised to go to [facility] for evaluation and possible testing.  Patient acknowledged understanding of instructions.

## 2018-09-11 ENCOUNTER — Telehealth: Payer: Self-pay | Admitting: Nurse Practitioner

## 2018-09-11 ENCOUNTER — Ambulatory Visit: Payer: Self-pay | Attending: Family Medicine | Admitting: Physician Assistant

## 2018-09-11 ENCOUNTER — Other Ambulatory Visit: Payer: Self-pay

## 2018-09-11 DIAGNOSIS — R509 Fever, unspecified: Secondary | ICD-10-CM

## 2018-09-11 DIAGNOSIS — R05 Cough: Secondary | ICD-10-CM

## 2018-09-11 NOTE — Telephone Encounter (Signed)
I don't have a way to email it to the employer. Letter has been ready since this morning. The letter is up front with [facility].  Could she provide a fax number?

## 2018-09-11 NOTE — Telephone Encounter (Signed)
Pt was called back imformed we wouldn't be able to email we could fax it over pt stated employer didn't have fax setup anf that she would have to see if shes able to come back due to her coming from [place]

## 2018-09-11 NOTE — Telephone Encounter (Signed)
Patient called in stated that she would like to have her note for employer for todays visit emailed to [email or web address] since she came to pick it up and it wasn't ready please follow up

## 2018-09-11 NOTE — Progress Notes (Signed)
Patient ID: Linda Phelps, female   DOB: [birth year], 48 y.o.   MRN: [id number]  Virtual Visit via Telephone Note  I connected with Linda Phelps on 09/11/18 at  9:10 AM EDT by telephone and verified that I am speaking with the correct person using two identifiers.   I discussed the limitations, risks, security and privacy concerns of performing an evaluation and management service by telephone and the availability of in person appointments. I also discussed with the patient that there may be a patient responsible charge related to this service. The patient expressed understanding and agreed to proceed.  Patient location: vehicle My location: [facility] office Persons on the call:  Myself and the PATIENT   History of Present Illness:  Cough and drainage with temp up to 98.9.  Temp never got higher than that.  symptoms were over the weekend and resolved 2 days ago.  No recent travel.  No known contact to anyone with Covid.  Cough was productive of clear/whitish mucus.  Monday was last day of temp. Feels fine/back to normal.  Works at [employer].  Wears mask and taking precautions.   97.5    Observations/Objective:  NAD, TP linear   Assessment and Plan: 1. Fever, unspecified fever cause Continue quarantine through the end of this weekend and may RTW Monday if continue to have no symptoms and fever doesn't return.      Follow Up Instructions: Pap in June as scheduled.     I discussed the assessment and treatment plan with the patient. The patient was provided an opportunity to ask questions and all were answered. The patient agreed with the plan and demonstrated an understanding of the instructions.   The patient was advised to call back or seek an in-person evaluation if the symptoms worsen or if the condition fails to improve as anticipated.  I provided 8 minutes of non-face-to-face time during this encounter.   Freeman Caldron, PA-C

## 2018-09-12 ENCOUNTER — Telehealth: Payer: Self-pay | Admitting: Nurse Practitioner

## 2018-09-12 NOTE — Telephone Encounter (Signed)
Pt called in wanting to speak to provider in regards to some of the symptoms she is having today from her appt yesterday please follow up

## 2018-09-13 NOTE — Telephone Encounter (Signed)
Call placed to patient.  Pt states she spoke to someone on yesterday regarding her matter.

## 2018-09-15 ENCOUNTER — Emergency Department (HOSPITAL_BASED_OUTPATIENT_CLINIC_OR_DEPARTMENT_OTHER): Payer: Self-pay

## 2018-09-15 ENCOUNTER — Emergency Department (HOSPITAL_BASED_OUTPATIENT_CLINIC_OR_DEPARTMENT_OTHER)
Admission: EM | Admit: 2018-09-15 | Discharge: 2018-09-15 | Disposition: A | Discharge status: Home / Self Care | Payer: Self-pay | Attending: Emergency Medicine | Admitting: Emergency Medicine

## 2018-09-15 ENCOUNTER — Encounter (HOSPITAL_BASED_OUTPATIENT_CLINIC_OR_DEPARTMENT_OTHER): Payer: Self-pay | Admitting: *Deleted

## 2018-09-15 ENCOUNTER — Other Ambulatory Visit: Payer: Self-pay

## 2018-09-15 DIAGNOSIS — E119 Type 2 diabetes mellitus without complications: Secondary | ICD-10-CM | POA: Insufficient documentation

## 2018-09-15 DIAGNOSIS — E039 Hypothyroidism, unspecified: Secondary | ICD-10-CM | POA: Insufficient documentation

## 2018-09-15 DIAGNOSIS — I5022 Chronic systolic (congestive) heart failure: Secondary | ICD-10-CM | POA: Insufficient documentation

## 2018-09-15 DIAGNOSIS — R059 Cough, unspecified: Secondary | ICD-10-CM

## 2018-09-15 DIAGNOSIS — Z79899 Other long term (current) drug therapy: Secondary | ICD-10-CM | POA: Insufficient documentation

## 2018-09-15 DIAGNOSIS — R05 Cough: Secondary | ICD-10-CM

## 2018-09-15 DIAGNOSIS — Z7984 Long term (current) use of oral hypoglycemic drugs: Secondary | ICD-10-CM | POA: Insufficient documentation

## 2018-09-15 DIAGNOSIS — J189 Pneumonia, unspecified organism: Secondary | ICD-10-CM | POA: Insufficient documentation

## 2018-09-15 DIAGNOSIS — Z7982 Long term (current) use of aspirin: Secondary | ICD-10-CM | POA: Insufficient documentation

## 2018-09-15 DIAGNOSIS — I110 Hypertensive heart disease with heart failure: Secondary | ICD-10-CM | POA: Insufficient documentation

## 2018-09-15 MED ORDER — DOXYCYCLINE HYCLATE 100 MG PO CAPS: 100.0000 mg | ORAL_CAPSULE | Freq: Two times a day (BID) | ORAL | 0 refills | Status: AC

## 2018-09-15 MED ORDER — DOXYCYCLINE HYCLATE 100 MG PO TABS
100.0000 mg | ORAL_TABLET | Freq: Once | ORAL | Status: AC
  Administered 2018-09-15: 18:00:00 100 mg via ORAL
  Filled 2018-09-15: qty 1

## 2018-09-15 NOTE — ED Provider Notes (Signed)
[facility] EMERGENCY DEPARTMENT Provider Note   CSN: [id number] Arrival date & time: 09/15/18  1446    History   Chief Complaint Chief Complaint  Patient presents with   Cough    HPI Suleika Donavan is a 48 y.o. female.     The history is provided by the patient and medical records.  Cough  Cough characteristics:  Non-productive Severity:  Severe Onset quality:  Gradual Duration:  10 days Timing:  Constant Progression:  Waxing and waning Chronicity:  New Relieved by:  Nothing Worsened by:  Nothing Ineffective treatments:  None tried Associated symptoms: chills and fever   Associated symptoms: no chest pain, no diaphoresis, no headaches, no rash, no rhinorrhea, no shortness of breath, no sinus congestion, no weight loss and no wheezing     Past Medical History:  Diagnosis Date   CHF (congestive heart failure) ([place])    Diabetes mellitus without complication ([place])    Ectopic pregnancy    High cholesterol    Hypertension    Low back pain    Obesity    Thyroid disease     Patient Active Problem List   Diagnosis Date Noted   Atypical chest pain 11/18/2017   Chest pain 11/18/2017   Chronic right-sided low back pain without sciatica 07/11/2016   Acute non intractable tension-type headache 05/18/2016   Abnormal chest x-ray 05/18/2016   Vitamin D insufficiency 01/13/2016   Menstrual cycle problem 01/11/2016   Hordeolum externum (stye) 01/11/2016   Obesity (BMI 30.0-34.9) 93/71/6967   Chronic systolic heart failure ([place]) 09/21/2015   History of noncompliance with medical treatment 09/21/2015   Long-term use of aspirin therapy 09/21/2015   Cardiomyopathy, peripartum, delivered 09/21/2015   Palpitations 07/26/2015   GERD (gastroesophageal reflux disease) 07/26/2015   Generalized anxiety disorder 07/26/2015   Poor dentition 04/13/2015   Cardiomegaly 09/23/2014   Diabetes mellitus type 2 in obese ([place]) 07/03/2014   Essential  hypertension 07/03/2014   Hyperlipidemia 07/03/2014   Hypothyroidism 07/03/2014    Past Surgical History:  Procedure Laterality Date   APPENDECTOMY     CHOLECYSTECTOMY       OB History   No obstetric history on file.      Home Medications    Prior to Admission medications   Medication Sig Start Date End Date Taking? Authorizing Provider  acetaminophen (TYLENOL) 500 MG tablet Take 2 tablets (1,000 mg total) by mouth every 6 (six) hours as needed for mild pain or moderate pain. Patient not taking: Reported on 07/03/2018 01/08/17   Alfonse Spruce, FNP  amLODipine (NORVASC) 10 MG tablet Take 1 tablet (10 mg total) by mouth daily. 07/03/18   Gildardo Pounds, NP  aspirin 81 MG tablet Take 1 tablet (81 mg total) by mouth daily. 10/17/17   Argentina Donovan, PA-C  Blood Glucose Monitoring Suppl (TRUE METRIX METER) w/Device KIT Use as instructed. Please monitor blood glucose levels fasting and 1 hour after lunch and dinner. 07/03/18   Gildardo Pounds, NP  carvedilol (COREG) 12.5 MG tablet Take 1 tablet (12.5 mg total) by mouth 2 (two) times daily with a meal. 07/03/18   Gildardo Pounds, NP  fluticasone (FLONASE) 50 MCG/ACT nasal spray Place 2 sprays into both nostrils daily. 10/08/17   Gildardo Pounds, NP  furosemide (LASIX) 20 MG tablet Take 1 tablet (20 mg total) by mouth 2 (two) times daily. 07/03/18   Gildardo Pounds, NP  glucose blood (TRUE METRIX BLOOD GLUCOSE TEST) test strip  as instructed. Please monitor blood glucose levels fasting and 1 hour after lunch and dinner. 07/03/18   Fleming, Zelda W, NP  °levothyroxine (SYNTHROID, LEVOTHROID) 50 MCG tablet Take 1 tablet (50 mcg total) by mouth daily for 30 days. 07/06/18 08/05/18  Fleming, Zelda W, NP  °lisinopril (PRINIVIL,ZESTRIL) 40 MG tablet Take 1 tablet (40 mg total) by mouth daily. 07/03/18   Fleming, Zelda W, NP  °metFORMIN (GLUCOPHAGE) 1000 MG tablet Take 1 tablet (1,000 mg total) by mouth 2 (two) times daily with a meal.  10/17/17   McClung, Angela M, PA-C  °ondansetron (ZOFRAN ODT) 8 MG disintegrating tablet Take 1 tablet (8 mg total) by mouth every 8 (eight) hours as needed for nausea or vomiting. 07/20/17   Molpus, John, MD  °pantoprazole (PROTONIX) 40 MG tablet Take 1 tablet (40 mg total) by mouth daily. 07/03/18 10/01/18  Fleming, Zelda W, NP  °triamcinolone cream (KENALOG) 0.1 % Apply 1 application topically 2 (two) times daily. °Patient not taking: Reported on 07/03/2018 10/17/17   McClung, Angela M, PA-C  °TRUEPLUS LANCETS 28G MISC Use as instructed. Please monitor blood glucose levels fasting and 1 hour after lunch and dinner. 07/03/18   Fleming, Zelda W, NP  ° ° °Family History °Family History  °Problem Relation Age of Onset  °• Diabetes Mother   °• Diabetes Maternal Grandmother   °• Colon cancer Maternal Uncle 65  ° ° °Social History °Social History  ° °Tobacco Use  °• Smoking status: Never Smoker  °• Smokeless tobacco: Never Used  °Substance Use Topics  °• Alcohol use: Yes  °  Alcohol/week: 0.0 standard drinks  °  Comment: occ  °• Drug use: No  ° ° ° °Allergies   °Ibuprofen ° ° °Review of Systems °Review of Systems  °Constitutional: Positive for chills and fever. Negative for diaphoresis, fatigue and weight loss.  °HENT: Negative for congestion and rhinorrhea.   °Respiratory: Positive for cough. Negative for chest tightness, shortness of breath and wheezing.   °Cardiovascular: Negative for chest pain, palpitations and leg swelling.  °Gastrointestinal: Negative for constipation, diarrhea, nausea and vomiting.  °Genitourinary: Negative for flank pain and frequency.  °Musculoskeletal: Negative for back pain.  °Skin: Negative for rash and wound.  °Neurological: Negative for light-headedness and headaches.  °Psychiatric/Behavioral: Negative for agitation.  °All other systems reviewed and are negative. ° ° ° °Physical Exam °Updated Vital Signs °BP (!) 183/95 (BP Location: Left Arm)    Pulse 94    Temp 98.6 °F (37 °C) (Oral)    Resp 18     Ht 5' 1" (1.549 m)    Wt 86.2 kg    LMP 10/08/2013    SpO2 98%    BMI 35.90 kg/m²  ° °Physical Exam °Vitals signs and nursing note reviewed.  °Constitutional:   °   General: She is not in acute distress. °   Appearance: She is well-developed.  °HENT:  °   Head: Normocephalic and atraumatic.  °   Nose: No congestion or rhinorrhea.  °   Mouth/Throat:  °   Mouth: Mucous membranes are moist.  °Eyes:  °   Conjunctiva/sclera: Conjunctivae normal.  °   Pupils: Pupils are equal, round, and reactive to light.  °Neck:  °   Musculoskeletal: Neck supple. No muscular tenderness.  °Cardiovascular:  °   Rate and Rhythm: Normal rate and regular rhythm.  °   Heart sounds: No murmur.  °Pulmonary:  °   Effort: Pulmonary effort is normal. No respiratory distress.  °     distress.     Breath sounds: Normal breath sounds. No wheezing, rhonchi or rales.  Chest:     Chest wall: No tenderness.  Abdominal:     Palpations: Abdomen is soft.     Tenderness: There is no abdominal tenderness. There is no right CVA tenderness or left CVA tenderness.  Skin:    General: Skin is warm and dry.     Capillary Refill: Capillary refill takes less than 2 seconds.  Neurological:     Mental Status: She is alert.  Psychiatric:        Mood and Affect: Mood normal.      ED Treatments / Results  Labs (all labs ordered are listed, but only abnormal results are displayed) Labs Reviewed - No data to display  EKG None  Radiology Dg Chest Portable 1 View  Result Date: 09/15/2018 CLINICAL DATA:  Chills cough and fever EXAM: PORTABLE CHEST 1 VIEW COMPARISON:  04/06/2018 FINDINGS: Vague asymmetric foci of opacity in the right upper and mid to lower lung. No pleural effusion. Stable cardiomediastinal silhouette. No pneumothorax. IMPRESSION: Vague foci of opacity in the right thorax, could be secondary to infiltrates. Could consider atypical/possible viral pneumonia in the appropriate clinical setting. Electronically Signed   By: Donavan Foil M.D.   On:  09/15/2018 17:25    Procedures Procedures (including critical care time)  Medications Ordered in ED Medications  doxycycline (VIBRA-TABS) tablet 100 mg (has no administration in time range)     Initial Impression / Assessment and Plan / ED Course  I have reviewed the triage vital signs and the nursing notes.  Pertinent labs & imaging results that were available during my care of the patient were reviewed by me and considered in my medical decision making (see chart for details).        Tigerlily Christine is a 48 y.o. female with a past medical history significant for hypertension, hyperlipidemia, diabetes, possible CHF, GERD, obesity, and thyroid disease who presents with fevers, chills, and cough.  Patient reports that for the last week and a half she has had the symptoms of URI and cough.  She denies significant chest pain or shortness of breath however she is concerned she may have the novel coronavirus.  She does report she has had a very wet deep cough during this time but has not had a Molpus.  She denies leg pain or leg swelling.  She denies history of DVT or PE.  She reports she has had a temperature up to 101 and has been taking medicine to help with fevers.  She has no known coronavirus contacts however, patient does work at [employer] where she is interacted with many people recently.  Patient is not having significant shortness breath on arrival.  On exam, lungs were clear.  No wheezing.  Chest was nontender.  Back was nontender.  Abdomen was nontender.  Patient's vital signs reassuring on arrival.  No rashes seen.  Patient well-appearing.  Due to the patient's reported persistent deep cough, chest x-ray was obtained to rule out pneumonia.  X-ray does show pneumonia.  Patient be started on antibiotics and given prescription for doxycycline.  Given the current coronavirus pandemic, patient may have COVID-19 however, based on the current testing protocols at this facility at  this time, we are not testing patients who are not hypoxic and requiring admission.  Patient is agreeable and understanding of this current protocol.  We will provide recommendations of the patient self quarantine and treat  herself as if she has the coronavirus.  We will still treat her with antibiotics for the possible pneumonia.  Patient was not hypoxic and was resting comfortably.  She does not need oxygen and thus will not need to be admitted at this time.  Patient understands return precautions for new or worsened symptoms and managing her fever at home.  She had no questions or concerns and was discharged in good condition.      Final Clinical Impressions(s) / ED Diagnoses   Final diagnoses:  Cough  Community acquired pneumonia, unspecified laterality    ED Discharge Orders         Ordered    doxycycline (VIBRAMYCIN) 100 MG capsule  2 times daily     09/15/18 1739          Clinical Impression: 1. Cough   2. Community acquired pneumonia, unspecified laterality     Disposition: Discharge  Condition: Good  I have discussed the results, Dx and Tx plan with the pt(& family if present). He/she/they expressed understanding and agree(s) with the plan. Discharge instructions discussed at great length. Strict return precautions discussed and pt &/or family have verbalized understanding of the instructions. No further questions at time of discharge.    New Prescriptions   DOXYCYCLINE (VIBRAMYCIN) 100 MG CAPSULE    Take 1 capsule (100 mg total) by mouth 2 (two) times daily for 7 days.    Follow Up: Gildardo Pounds, NP [place] [id number] [phone]     [facility] [facility]ENCY DEPART[facility] [address] [id number] YI RSWN [place] [phone]       Dvora Buitron, Gwenyth Allegra, MD 09/15/18 [phone]

## 2018-09-15 NOTE — Discharge Instructions (Signed)
Your history and exam and imaging today are consistent with a very early pneumonia.  We are starting you on antibiotics and gave you a dose here.  Please take it twice a day for the next 7 days.  Based on the current pandemic, we cannot definitively tell you resting protocol that you do or do not have the coronavirus however, we are recommending you self quarantine as if you have it.  If any symptoms change or worsen, please return to the nearest emergency department.  Please follow-up with your primary doctor.  Please stay hydrated and rest.  Please treat any fevers that you have with over-the-counter medications.     Person Under Monitoring Name: Linda Phelps  Location: [facility] [address]   Infection Prevention Recommendations for Individuals Confirmed to have, or Being Evaluated for, 2019 Novel Coronavirus (COVID-19) Infection Who Receive Care at Home  Individuals who are confirmed to have, or are being evaluated for, COVID-19 should follow the prevention steps below until a healthcare provider or local or state health department says they can return to normal activities.  Stay home except to get medical care You should restrict activities outside your home, except for getting medical care. Do not go to work, school, or public areas, and do not use public transportation or taxis.  Call ahead before visiting your doctor Before your medical appointment, call the healthcare provider and tell them that you have, or are being evaluated for, COVID-19 infection. This will help the healthcare providers office take steps to keep other people from getting infected. Ask your healthcare provider to call the local or state health department.  Monitor your symptoms Seek prompt medical attention if your illness is worsening (e.g., difficulty breathing). Before going to your medical appointment, call the healthcare provider and tell them that you have, or are being evaluated  for, COVID-19 infection. Ask your healthcare provider to call the local or state health department.  Wear a facemask You should wear a facemask that covers your nose and mouth when you are in the same room with other people and when you visit a healthcare provider. People who live with or visit you should also wear a facemask while they are in the same room with you.  Separate yourself from other people in your home As much as possible, you should stay in a different room from other people in your home. Also, you should use a separate bathroom, if available.  Avoid sharing household items You should not share dishes, drinking glasses, cups, eating utensils, towels, bedding, or other items with other people in your home. After using these items, you should wash them thoroughly with soap and water.  Cover your coughs and sneezes Cover your mouth and nose with a tissue when you cough or sneeze, or you can cough or sneeze into your sleeve. Throw used tissues in a lined trash can, and immediately wash your hands with soap and water for at least 20 seconds or use an alcohol-based hand rub.  Wash your [facility] your hands often and thoroughly with soap and water for at least 20 seconds. You can use an alcohol-based hand sanitizer if soap and water are not available and if your hands are not visibly dirty. Avoid touching your eyes, nose, and mouth with unwashed hands.   Prevention Steps for Caregivers and Household Members of Individuals Confirmed to have, or Being Evaluated for, COVID-19 Infection Being Cared for in the Home  If you live with,  or provide care at home for, a person confirmed to have, or being evaluated for, COVID-19 infection please follow these guidelines to prevent infection:  Follow healthcare providers instructions Make sure that you understand and can help the patient follow any healthcare provider instructions for all care.  Provide for the patients basic  needs You should help the patient with basic needs in the home and provide support for getting groceries, prescriptions, and other personal needs.  Monitor the patients symptoms If they are getting sicker, call his or her medical provider and tell them that the patient has, or is being evaluated for, COVID-19 infection. This will help the healthcare providers office take steps to keep other people from getting infected. Ask the healthcare provider to call the local or state health department.  Limit the number of people who have contact with the patient If possible, have only one caregiver for the patient. Other household members should stay in another home or place of residence. If this is not possible, they should stay in another room, or be separated from the patient as much as possible. Use a separate bathroom, if available. Restrict visitors who do not have an essential need to be in the home.  Keep older adults, very young children, and other sick people away from the patient Keep older adults, very young children, and those who have compromised immune systems or chronic health conditions away from the patient. This includes people with chronic heart, lung, or kidney conditions, diabetes, and cancer.  Ensure good ventilation Make sure that shared spaces in the home have good air flow, such as from an air conditioner or an opened window, weather permitting.  Wash your hands often Wash your hands often and thoroughly with soap and water for at least 20 seconds. You can use an alcohol based hand sanitizer if soap and water are not available and if your hands are not visibly dirty. Avoid touching your eyes, nose, and mouth with unwashed hands. Use disposable paper towels to dry your hands. If not available, use dedicated cloth towels and replace them when they become wet.  Wear a facemask and gloves Wear a disposable facemask at all times in the room and gloves when you touch or have  contact with the patients blood, body fluids, and/or secretions or excretions, such as sweat, saliva, sputum, nasal mucus, vomit, urine, or feces.  Ensure the mask fits over your nose and mouth tightly, and do not touch it during use. Throw out disposable facemasks and gloves after using them. Do not reuse. Wash your hands immediately after removing your facemask and gloves. If your personal clothing becomes contaminated, carefully remove clothing and launder. Wash your hands after handling contaminated clothing. Place all used disposable facemasks, gloves, and other waste in a lined container before disposing them with other household waste. Remove gloves and wash your hands immediately after handling these items.  Do not share dishes, glasses, or other household items with the patient Avoid sharing household items. You should not share dishes, drinking glasses, cups, eating utensils, towels, bedding, or other items with a patient who is confirmed to have, or being evaluated for, COVID-19 infection. After the person uses these items, you should wash them thoroughly with soap and water.  Wash laundry thoroughly Immediately remove and wash clothes or bedding that have blood, body fluids, and/or secretions or excretions, such as sweat, saliva, sputum, nasal mucus, vomit, urine, or feces, on them. Wear gloves when handling laundry from the patient. Read and  follow directions on labels of laundry or clothing items and detergent. In general, wash and dry with the warmest temperatures recommended on the label.  Clean all areas the individual has used often Clean all touchable surfaces, such as counters, tabletops, doorknobs, bathroom fixtures, toilets, phones, keyboards, tablets, and bedside tables, every day. Also, clean any surfaces that may have blood, body fluids, and/or secretions or excretions on them. Wear gloves when cleaning surfaces the patient has come in contact with. Use a diluted bleach  solution (e.g., dilute bleach with 1 part bleach and 10 parts water) or a household disinfectant with a label that says EPA-registered for coronaviruses. To make a bleach solution at home, add 1 tablespoon of bleach to 1 quart (4 cups) of water. For a larger supply, add  cup of bleach to 1 gallon (16 cups) of water. Read labels of cleaning products and follow recommendations provided on product labels. Labels contain instructions for safe and effective use of the cleaning product including precautions you should take when applying the product, such as wearing gloves or eye protection and making sure you have good ventilation during use of the product. Remove gloves and wash hands immediately after cleaning.  Monitor yourself for signs and symptoms of illness Caregivers and household members are considered close contacts, should monitor their health, and will be asked to limit movement outside of the home to the extent possible. Follow the monitoring steps for close contacts listed on the symptom monitoring form.   ? If you have additional questions, contact your local health department or call the epidemiologist on call at [phone] (available 24/7). ? This guidance is subject to change. For the most up-to-date guidance from [facility], please refer to their website: YouBlogs.pl

## 2018-09-15 NOTE — ED Triage Notes (Signed)
Wet cough x 1 week with elevated temp up to 99.6. Pt reports "I can't smell or taste anything right now"

## 2018-09-23 NOTE — Telephone Encounter (Signed)
Follow up.

## 2018-10-02 ENCOUNTER — Encounter: Payer: Self-pay | Admitting: Critical Care Medicine

## 2018-10-02 ENCOUNTER — Ambulatory Visit: Payer: Self-pay | Attending: Critical Care Medicine | Admitting: Critical Care Medicine

## 2018-10-02 ENCOUNTER — Other Ambulatory Visit: Payer: Self-pay

## 2018-10-02 DIAGNOSIS — R6889 Other general symptoms and signs: Secondary | ICD-10-CM

## 2018-10-02 DIAGNOSIS — E1169 Type 2 diabetes mellitus with other specified complication: Secondary | ICD-10-CM

## 2018-10-02 DIAGNOSIS — Z20822 Contact with and (suspected) exposure to covid-19: Secondary | ICD-10-CM

## 2018-10-02 DIAGNOSIS — I10 Essential (primary) hypertension: Secondary | ICD-10-CM

## 2018-10-02 DIAGNOSIS — E669 Obesity, unspecified: Secondary | ICD-10-CM

## 2018-10-02 NOTE — Progress Notes (Addendum)
Subjective:    Patient ID: Linda Phelps, female    DOB: [birth year], 48 y.o.   MRN: [id number] Virtual Visit via Telephone Note  I connected with Linda Phelps on 10/02/18 at  8:30 AM EDT by telephone and verified that I am speaking with the correct person using two identifiers.   Consent:  I discussed the limitations, risks, security and privacy concerns of performing an evaluation and management service by telephone and the availability of in person appointments. I also discussed with the patient that there may be a patient responsible charge related to this service. The patient expressed understanding and agreed to proceed.  Location of patient: Pt is at home  Location of provider: I was in my office  Persons participating in the televisit with the patient.   Pt by herself  History of Present Illness: This is a 48 year old female who went to the emergency room on 26 April after having a previous telephone visit in our clinic on 20 April for a flulike illness.  The patient had a previous history of hypertension, hyperlipidemia, diabetes type 2, heart failure, obesity, reflux, and hypothyroidism.  The patient developed fever cough and chills and had increased shortness of breath but no chest pain.  The cough is very deep and wet in nature.  The patient did present to the emergency room was found to have patchy right-sided pneumonia which was classic for viral type pneumonia.  However she was not hypoxic and despite having had a fever up to 101 degrees she took medication to bring it down she did not show a fever when she went to the emergency room.  Her vital signs are reassuring and her exam was stable and the patient was thus sent home on oral antibiotics and was actually not tested for COVID at that time.  During that period of time there was not enough testing material to be able to test all ambulatory patients and she since she was not admitted she was not tested.  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  She actually has been markedly better over the past several days and actually went back to work on her own on May 5.  This was 7 days from when she was in the emergency room and more than 7 days since she first became ill on April 20.  She states her temperature that she has been recording is 97 degrees.  She states that she has had blood sugars ranging 90-1 10.  She has no body aches.  All of her respiratory symptoms have completely resolved at this time.  The patient has no GI symptoms.  Her urine output is adequate.  She has no dry mouth.  She does not have loss of taste or loss of smell.  She has resolution of all her body aches.  Her cough and shortness of breath have resolved.  She has had no fever as well.   Note the patient did take all of the antibiotics prescribed to her.   Review of Systems  Constitutional: Negative.   HENT: Negative.   Eyes: Positive for itching. Negative for photophobia and pain.  Respiratory: Negative.   Cardiovascular: Negative for chest pain, palpitations and leg swelling.  Gastrointestinal: Negative for diarrhea, nausea and vomiting.  Genitourinary: Negative.   Musculoskeletal: Positive for back pain. Negative for joint swelling and myalgias.  Neurological: Negative.   Psychiatric/Behavioral: Negative.    Observations/Objective:  CXR 4/26:    IMPRESSION: Vague foci of opacity in the right thorax, could be secondary to infiltrates. Could consider atypical/possible viral pneumonia in the appropriate clinical setting.  All the labs from recent hospital visit are reviewed  Assessment and Plan: #1 probable COVID-19 infection based on history and physical findings on April 26.  Note the patient has been symptom free since May 5.  Therefore based on this I am in alignment with the patient having to be able to return to work.  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  Her employer is aware that she is back at work and likely had COVID infection.  Note she did not receive any testing for this.   I would like for this patient to come back to see 1 of our primary care providers in the first of June and I have cleared her to be able to return to work.  The patient states she does not need a letter stating this information is needed.  #2 type 2 diabetes: Blood sugars appear to be well controlled based on the patient's meter readings and she will continue her current program of metformin.  #3 hypertension appears to be well-controlled based on the patient's reports she will continue her current blood pressure medication protocol  Follow Up Instructions: The patient understands and in office visit will be made 1 June to reestablish with her primary care and she is cleared to return to work    I discussed the assessment and treatment plan with the patient. The patient was provided an opportunity to ask questions and all were answered. The patient agreed with the plan and demonstrated an understanding of the instructions.   The patient was advised to call back or seek an in-person evaluation if the symptoms worsen or if the condition fails to improve as anticipated.  I provided 30 minutes of non-face-to-face time during this encounter  including  median intraservice time , review of notes, labs, imaging, medications  and explaining diagnosis and management to the patient .    Asencion Noble, MD

## 2018-10-03 ENCOUNTER — Telehealth: Payer: Self-pay | Admitting: Nurse Practitioner

## 2018-10-03 ENCOUNTER — Other Ambulatory Visit: Payer: Self-pay | Admitting: Pharmacist

## 2018-10-03 DIAGNOSIS — E039 Hypothyroidism, unspecified: Secondary | ICD-10-CM

## 2018-10-03 MED ORDER — LEVOTHYROXINE SODIUM 50 MCG PO TABS: 50.0000 ug | ORAL_TABLET | Freq: Every day | ORAL | 2 refills | Status: DC

## 2018-10-03 NOTE — Telephone Encounter (Signed)
Patient called stating that her employer is wanting her to be out due to the suspected COVID. Patient is wanting to know if she can get a report for unemployment purposes. Please follow up.

## 2018-10-04 ENCOUNTER — Telehealth: Payer: Self-pay | Admitting: Nurse Practitioner

## 2018-10-04 NOTE — Telephone Encounter (Signed)
Attempt to call patient to get more information about the request she made for a note. Was not able to locate in chart were she had a positive COVID-19 test.   Per patient, she just spoke to Dr. Joya Gaskins who stated he would relay information to Linda Phelps.

## 2018-10-04 NOTE — Telephone Encounter (Signed)
Pt called back in regards to having 2 missed calls please follow up

## 2018-10-04 NOTE — Telephone Encounter (Signed)
[employer]  Human Resources Contact Information: Maryjane Hurter [phone]

## 2018-10-04 NOTE — Telephone Encounter (Signed)
Spoke with Dr. Joya Gaskins and tried to leave a message for Ms. Hudler. Was unable to LVM as voice mail is not set up. If patient calls back please let her know I need the name and contact number for human resources personnel or the name and contact number to the person who is requiring her to have a letter from her PCP.

## 2018-10-07 NOTE — Telephone Encounter (Signed)
Spoke to [employer] with [employer]. She states Ms. Moncivais told her that she was told by a provider here that she was told she should not return to work and that she likely had [place]. I instructed Tomeka that as long as safety measures are in place for all of their employees that Ms. Mettler would be able to return to work with no restrictions. Tomeka assured me that their employees are required to don masks. Gloves are also required with certain job duties. As Ms. Antonucci is asymptomatic I see no reason she will need to take a leave of absence from her current job.

## 2018-10-08 ENCOUNTER — Telehealth: Payer: Self-pay | Admitting: Nurse Practitioner

## 2018-10-08 ENCOUNTER — Telehealth: Payer: Self-pay | Admitting: Pharmacist

## 2018-10-08 NOTE — Telephone Encounter (Signed)
Pt last seen by PCP 07/03/18 - renal function noted to be down. Zelda noted that she wanted to recheck labs at a f/u appt but the pt has been self-quarantine after suspected Covid-19 infection.   Will forward request to PCP for review. Recommend labs before refilling metformin.

## 2018-10-08 NOTE — Telephone Encounter (Signed)
CMA spoke to patient. Patient phone was going in and out and CMA hardly could hear patient well. Patient stated she was upset that PCP could not write her note to be out of work for 30 days. CMA offered patient to speak to PCP and patient stated she was going to talk to someone else and hung up.  CMA attempt to call patient back, and patient did not answer.

## 2018-10-08 NOTE — Telephone Encounter (Signed)
Patient called stating she would like to speak to PCP in regards to HR call and the letter for work. Please follow up.

## 2018-10-09 NOTE — Telephone Encounter (Signed)
She is to hold metformin until she has labs on 10-21-2018

## 2018-10-16 NOTE — Telephone Encounter (Signed)
CMA attempt to reach patient to inform to PCP advising. No answer and unable to leave a VM.

## 2018-10-18 ENCOUNTER — Telehealth: Payer: Self-pay | Admitting: Nurse Practitioner

## 2018-10-18 NOTE — Telephone Encounter (Signed)
Pt nearing end of monitoring period and has been in contact with [facility], pt is scheduled for f/u appt 6/1. Pt had previously requested work note though documentation that she was feeling better. No call to pt

## 2018-10-21 ENCOUNTER — Ambulatory Visit: Payer: Self-pay | Admitting: Nurse Practitioner

## 2018-11-13 ENCOUNTER — Other Ambulatory Visit: Payer: Self-pay | Admitting: Nurse Practitioner

## 2018-11-14 ENCOUNTER — Encounter (HOSPITAL_BASED_OUTPATIENT_CLINIC_OR_DEPARTMENT_OTHER): Payer: Self-pay | Admitting: *Deleted

## 2018-11-14 ENCOUNTER — Other Ambulatory Visit: Payer: Self-pay

## 2018-11-14 ENCOUNTER — Telehealth: Payer: Self-pay | Admitting: Nurse Practitioner

## 2018-11-14 ENCOUNTER — Emergency Department (HOSPITAL_BASED_OUTPATIENT_CLINIC_OR_DEPARTMENT_OTHER)
Admission: EM | Admit: 2018-11-14 | Discharge: 2018-11-14 | Disposition: A | Discharge status: Home / Self Care | Payer: Self-pay | Attending: Emergency Medicine | Admitting: Emergency Medicine

## 2018-11-14 ENCOUNTER — Emergency Department (HOSPITAL_BASED_OUTPATIENT_CLINIC_OR_DEPARTMENT_OTHER): Payer: Self-pay

## 2018-11-14 DIAGNOSIS — E039 Hypothyroidism, unspecified: Secondary | ICD-10-CM | POA: Insufficient documentation

## 2018-11-14 DIAGNOSIS — M79604 Pain in right leg: Secondary | ICD-10-CM | POA: Insufficient documentation

## 2018-11-14 DIAGNOSIS — I5022 Chronic systolic (congestive) heart failure: Secondary | ICD-10-CM | POA: Insufficient documentation

## 2018-11-14 DIAGNOSIS — I110 Hypertensive heart disease with heart failure: Secondary | ICD-10-CM | POA: Insufficient documentation

## 2018-11-14 DIAGNOSIS — E119 Type 2 diabetes mellitus without complications: Secondary | ICD-10-CM | POA: Insufficient documentation

## 2018-11-14 MED ORDER — DICLOFENAC SODIUM 1 % TD GEL: 2.0000 g | Freq: Four times a day (QID) | TRANSDERMAL | 0 refills | Status: DC

## 2018-11-14 NOTE — ED Triage Notes (Signed)
Rt leg pain x 1 week,  Denies inj, pain pain started in lower leg now pain more  in thigh and groin

## 2018-11-14 NOTE — ED Provider Notes (Signed)
Emergency Department Provider Note   I have reviewed the triage vital signs and the nursing notes.   HISTORY  Chief Complaint Leg Pain   HPI Linda Phelps is a 48 y.o. female presents to the ED with right thigh pain. Pain is medial and radiating down the leg. No back pain. No injury. No weakness/numbness. No swelling. No fever or chills. No rash or redness. No pain in the joints. Pain is moderate and worse with movement.   Past Medical History:  Diagnosis Date  . CHF (congestive heart failure) ([place])   . Diabetes mellitus without complication ([place])   . Ectopic pregnancy   . High cholesterol   . Hypertension   . Low back pain   . Obesity   . Thyroid disease     Patient Active Problem List   Diagnosis Date Noted  . Suspected Covid-19 Virus Infection 10/02/2018  . Atypical chest pain 11/18/2017  . Chest pain 11/18/2017  . Chronic right-sided low back pain without sciatica 07/11/2016  . Acute non intractable tension-type headache 05/18/2016  . Abnormal chest x-ray 05/18/2016  . Vitamin D insufficiency 01/13/2016  . Menstrual cycle problem 01/11/2016  . Hordeolum externum (stye) 01/11/2016  . Obesity (BMI 30.0-34.9) 10/01/2015  . Chronic systolic heart failure ([place]) 09/21/2015  . History of noncompliance with medical treatment 09/21/2015  . Tripton Ned-term use of aspirin therapy 09/21/2015  . 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 Palpitations 07/26/2015  . GERD (gastroesophageal reflux disease) 07/26/2015  . Generalized anxiety disorder 07/26/2015  . Poor dentition 04/13/2015  . Cardiomegaly 09/23/2014  . Diabetes mellitus type 2 in obese ([place]) 07/03/2014  . Essential hypertension 07/03/2014  . Hyperlipidemia 07/03/2014  . Hypothyroidism 07/03/2014    Past Surgical History:  Procedure Laterality Date  . APPENDECTOMY    . CHOLECYSTECTOMY      Allergies Ibuprofen  Family History  Problem Relation Age of Onset  . Diabetes Mother   . Diabetes  Maternal Grandmother   . Colon cancer Maternal Uncle 53    Social History Social History   Tobacco Use  . Smoking status: Never Smoker  . Smokeless tobacco: Never Used  Substance Use Topics  . Alcohol use: Yes    Alcohol/week: 0.0 standard drinks    Comment: occ  . Drug use: No    Review of Systems  Constitutional: No fever/chills Eyes: No visual changes. ENT: No sore throat. Cardiovascular: Denies chest pain. Respiratory: Denies shortness of breath. Gastrointestinal: No abdominal pain.  No nausea, no vomiting.  No diarrhea.  No constipation. Genitourinary: Negative for dysuria. Musculoskeletal: Negative for back pain. Positive right thigh pain.  Skin: Negative for rash. Neurological: Negative for headaches, focal weakness or numbness.  10-point ROS otherwise negative.  ____________________________________________   PHYSICAL EXAM:  VITAL SIGNS: ED Triage Vitals  Enc Vitals Group     BP 11/14/18 1047 (!) 157/90     Pulse Rate 11/14/18 1047 77     Resp 11/14/18 1047 18     Temp 11/14/18 1047 98.1 F (36.7 C)     Temp Source 11/14/18 1047 Oral     SpO2 11/14/18 1047 98 %    Constitutional: Alert and oriented. Well appearing and in no acute distress. Eyes: Conjunctivae are normal.  Head: Atraumatic. Nose: No congestion/rhinnorhea. Mouth/Throat: Mucous membranes are moist.  Neck: No stridor.  Cardiovascular: Good peripheral circulation. Respiratory: Normal respiratory effort. Gastrointestinal:  No distention.  Musculoskeletal: No lower extremity tenderness nor edema. No gross deformities of extremities. Neurologic:  Normal  speech and language. No gross focal neurologic deficits are appreciated.  Skin:  Skin is warm, dry and intact. No rash noted.  ____________________________________________  RADIOLOGY  US Venous Img Lower Right (dvt Study)  Result Date: 11/14/2018 CLINICAL DATA:  48 year old female with right lower extremity pain for 1 week EXAM: RIGHT  LOWER EXTREMITY VENOUS DOPPLER ULTRASOUND TECHNIQUE: Gray-scale sonography with graded compression, as well as color Doppler and duplex ultrasound were performed to evaluate the lower extremity deep venous systems from the level of the common femoral vein and including the common femoral, femoral, profunda femoral, popliteal and calf veins including the posterior tibial, peroneal and gastrocnemius veins when visible. The superficial great saphenous vein was also interrogated. Spectral Doppler was utilized to evaluate flow at rest and with distal augmentation maneuvers in the common femoral, femoral and popliteal veins. COMPARISON:  None. FINDINGS: Contralateral Common Femoral Vein: Respiratory phasicity is normal and symmetric with the symptomatic side. No evidence of thrombus. Normal compressibility. Common Femoral Vein: No evidence of thrombus. Normal compressibility, respiratory phasicity and response to augmentation. Saphenofemoral Junction: No evidence of thrombus. Normal compressibility and flow on color Doppler imaging. Profunda Femoral Vein: No evidence of thrombus. Normal compressibility and flow on color Doppler imaging. Femoral Vein: No evidence of thrombus. Normal compressibility, respiratory phasicity and response to augmentation. Popliteal Vein: No evidence of thrombus. Normal compressibility, respiratory phasicity and response to augmentation. Calf Veins: No evidence of thrombus. Normal compressibility and flow on color Doppler imaging. Superficial Great Saphenous Vein: No evidence of thrombus. Normal compressibility and flow on color Doppler imaging. Other Findings:  None. IMPRESSION: Sonographic survey of the right lower extremity negative for DVT Electronically Signed   By: Corrie Mckusick D.O.   On: 11/14/2018 14:20    ____________________________________________   PROCEDURES  Procedure(s) performed:   Procedures  None ____________________________________________   INITIAL IMPRESSION  / ASSESSMENT AND PLAN / ED COURSE  Pertinent labs & imaging results that were available during my care of the patient were reviewed by me and considered in my medical decision making (see chart for details).   Patient with right leg pain. Suspect MSK strain. No rash or leg swelling. DVT US negative for DVT. Plan for conservative mgmt at home and PCP follow up if symptoms continue after 1-2 weeks.    ____________________________________________  FINAL CLINICAL IMPRESSION(S) / ED DIAGNOSES  Final diagnoses:  Right leg pain     NEW OUTPATIENT MEDICATIONS STARTED DURING THIS VISIT:  Discharge Medication List as of 11/14/2018  2:29 PM    START taking these medications   Details  diclofenac sodium (VOLTAREN) 1 % GEL Apply 2 g topically 4 (four) times daily., Starting Thu 11/14/2018, Print        Note:  This document was prepared using Dragon voice recognition software and may include unintentional dictation errors.  Nanda Quinton, MD Emergency Medicine    Dalma Panchal, [facility], MD 11/16/18 [phone]

## 2018-11-14 NOTE — Discharge Instructions (Signed)

## 2018-11-14 NOTE — Telephone Encounter (Signed)
Pt would like to know when she is cleared to reschedule her pap smear, she would like to speak with her nurse when possible, please follow up

## 2018-11-14 NOTE — ED Notes (Signed)
ED Provider at bedside. 

## 2018-11-14 NOTE — Telephone Encounter (Signed)
Attempt to call patient back, Unable to get through patient and unable to leave a VM due to not being set up.

## 2018-11-15 ENCOUNTER — Other Ambulatory Visit: Payer: Self-pay | Admitting: Pharmacist

## 2018-11-15 DIAGNOSIS — I10 Essential (primary) hypertension: Secondary | ICD-10-CM

## 2018-11-15 MED ORDER — CARVEDILOL 12.5 MG PO TABS: 12.5000 mg | ORAL_TABLET | Freq: Two times a day (BID) | ORAL | 2 refills | Status: DC

## 2018-11-25 ENCOUNTER — Other Ambulatory Visit: Payer: Self-pay | Admitting: Nurse Practitioner

## 2018-11-25 ENCOUNTER — Telehealth: Payer: Self-pay | Admitting: Nurse Practitioner

## 2018-11-25 DIAGNOSIS — I10 Essential (primary) hypertension: Secondary | ICD-10-CM

## 2018-11-25 DIAGNOSIS — E1121 Type 2 diabetes mellitus with diabetic nephropathy: Secondary | ICD-10-CM

## 2018-11-25 NOTE — Telephone Encounter (Signed)
1) Medication(s) Requested (by name): Furosemide Lisinopril mtformin Carvedilol Flexeril floinade 2) Pharmacy of Choice: [facility], [place] 3) Special Requests:   Approved medications will be sent to the pharmacy, we will reach out if there is an issue.  Requests made after 3pm may not be addressed until the following business day!  If a patient is unsure of the name of the medication(s) please note and ask patient to call back when they are able to provide all info, do not send to responsible party until all information is available!

## 2018-11-25 NOTE — Telephone Encounter (Signed)
Patient called stating she would like to speak with someone because she would like to be seen in person in regards to her HFU . Please follow up

## 2018-11-27 NOTE — Telephone Encounter (Signed)
CMA attempt to reach patient to inform her visit on 12/02/2018 is a phone visit with Dr. Chapman Fitch for her right leg pain.  No answer and left a VM.

## 2018-11-28 MED ORDER — FUROSEMIDE 20 MG PO TABS: 20.0000 mg | ORAL_TABLET | Freq: Two times a day (BID) | ORAL | 2 refills | Status: DC

## 2018-11-28 MED ORDER — FLUTICASONE PROPIONATE 50 MCG/ACT NA SUSP: 2.0000 | Freq: Every day | NASAL | 2 refills | Status: DC

## 2018-11-28 MED ORDER — METFORMIN HCL 1000 MG PO TABS: 1000.0000 mg | ORAL_TABLET | Freq: Two times a day (BID) | ORAL | 0 refills | Status: DC

## 2018-12-02 ENCOUNTER — Inpatient Hospital Stay: Payer: Self-pay | Admitting: Family Medicine

## 2018-12-02 MED ORDER — LISINOPRIL 40 MG PO TABS: 40.0000 mg | ORAL_TABLET | Freq: Every day | ORAL | 0 refills | Status: DC

## 2018-12-16 IMAGING — DX DG CHEST 2V
2 series · 2 of 2 positions shown · non-contrast
Comparison: 05/03/2016

CLINICAL DATA: Left-sided chest pain, onset tonight.

EXAM:
CHEST  2 VIEW

[chest pa]
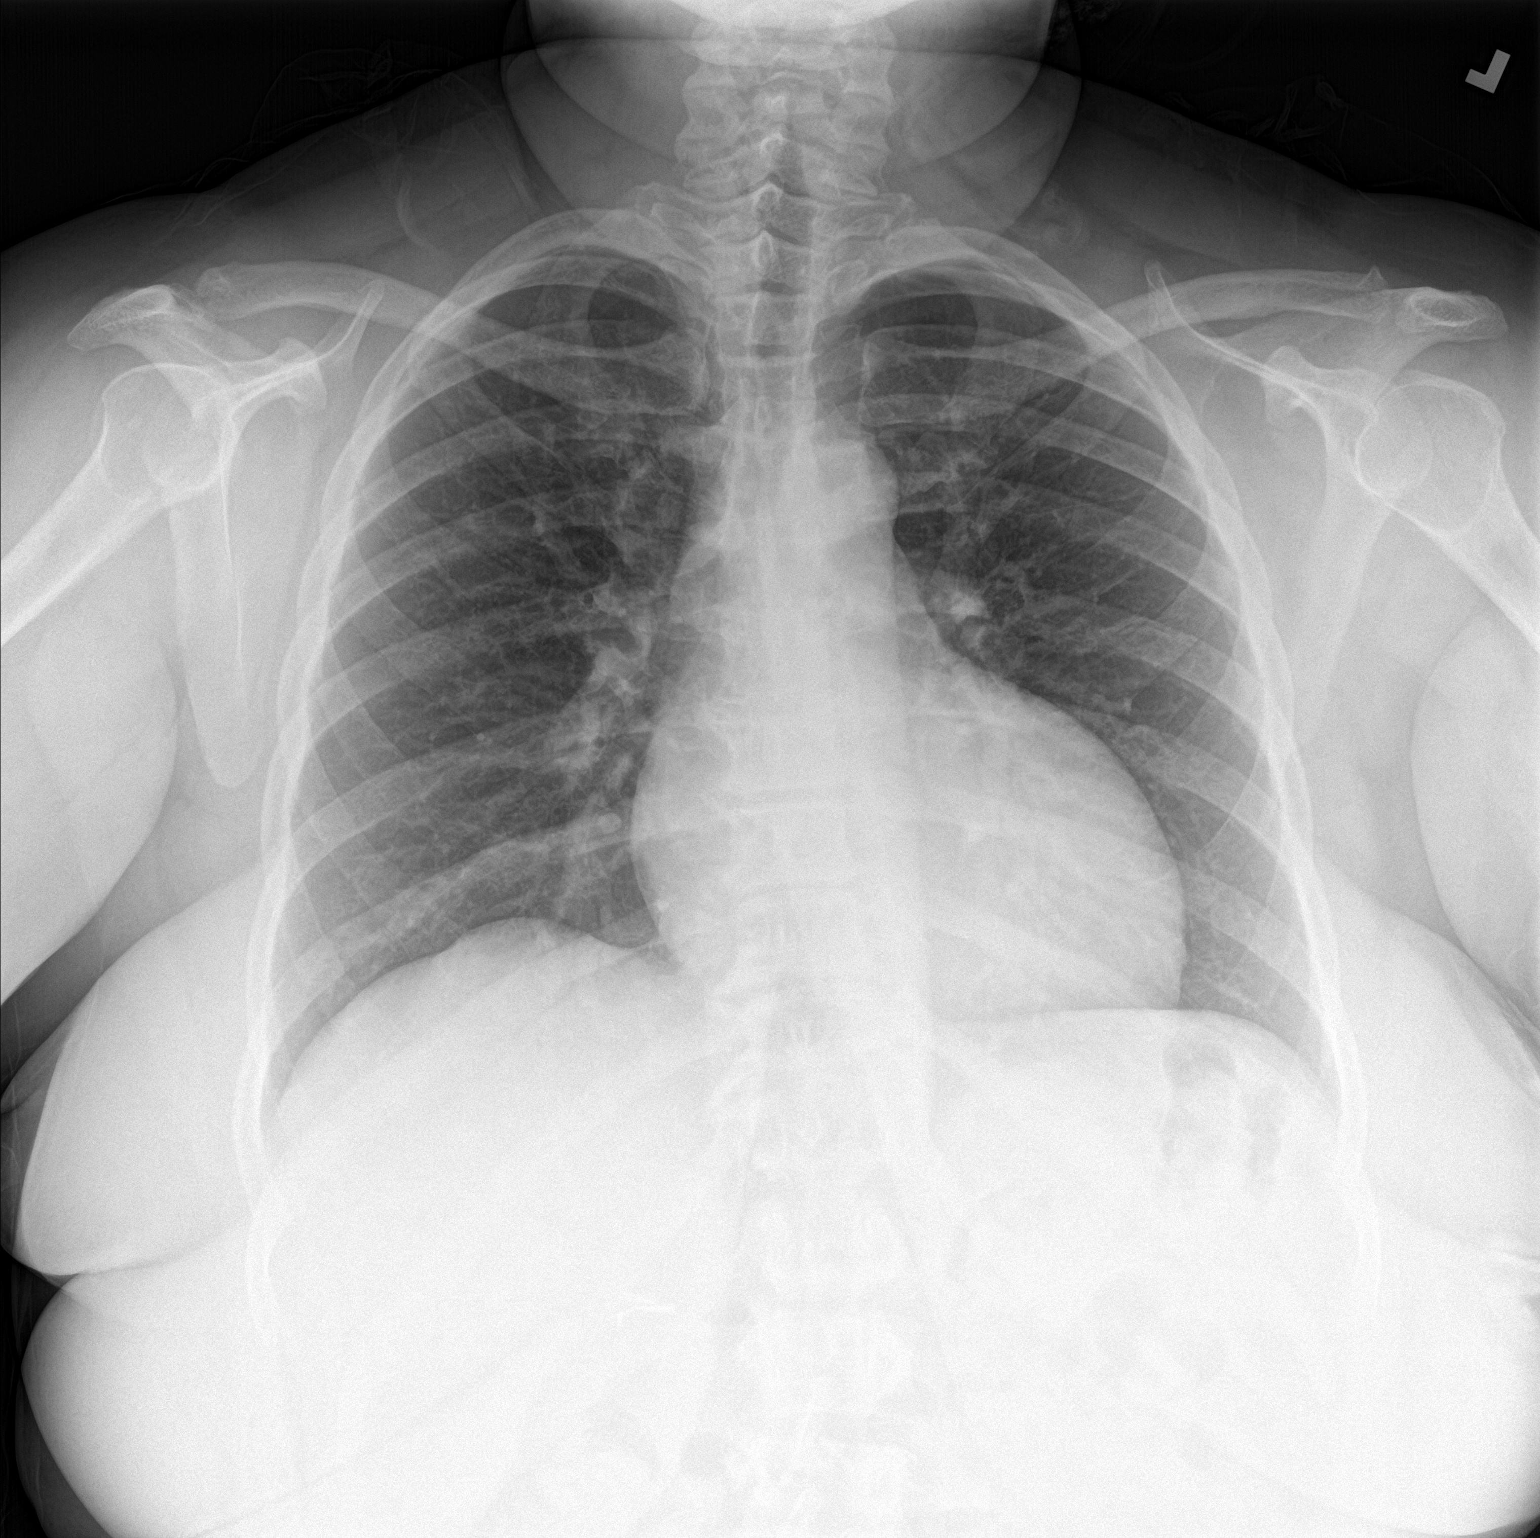

[chest lat]
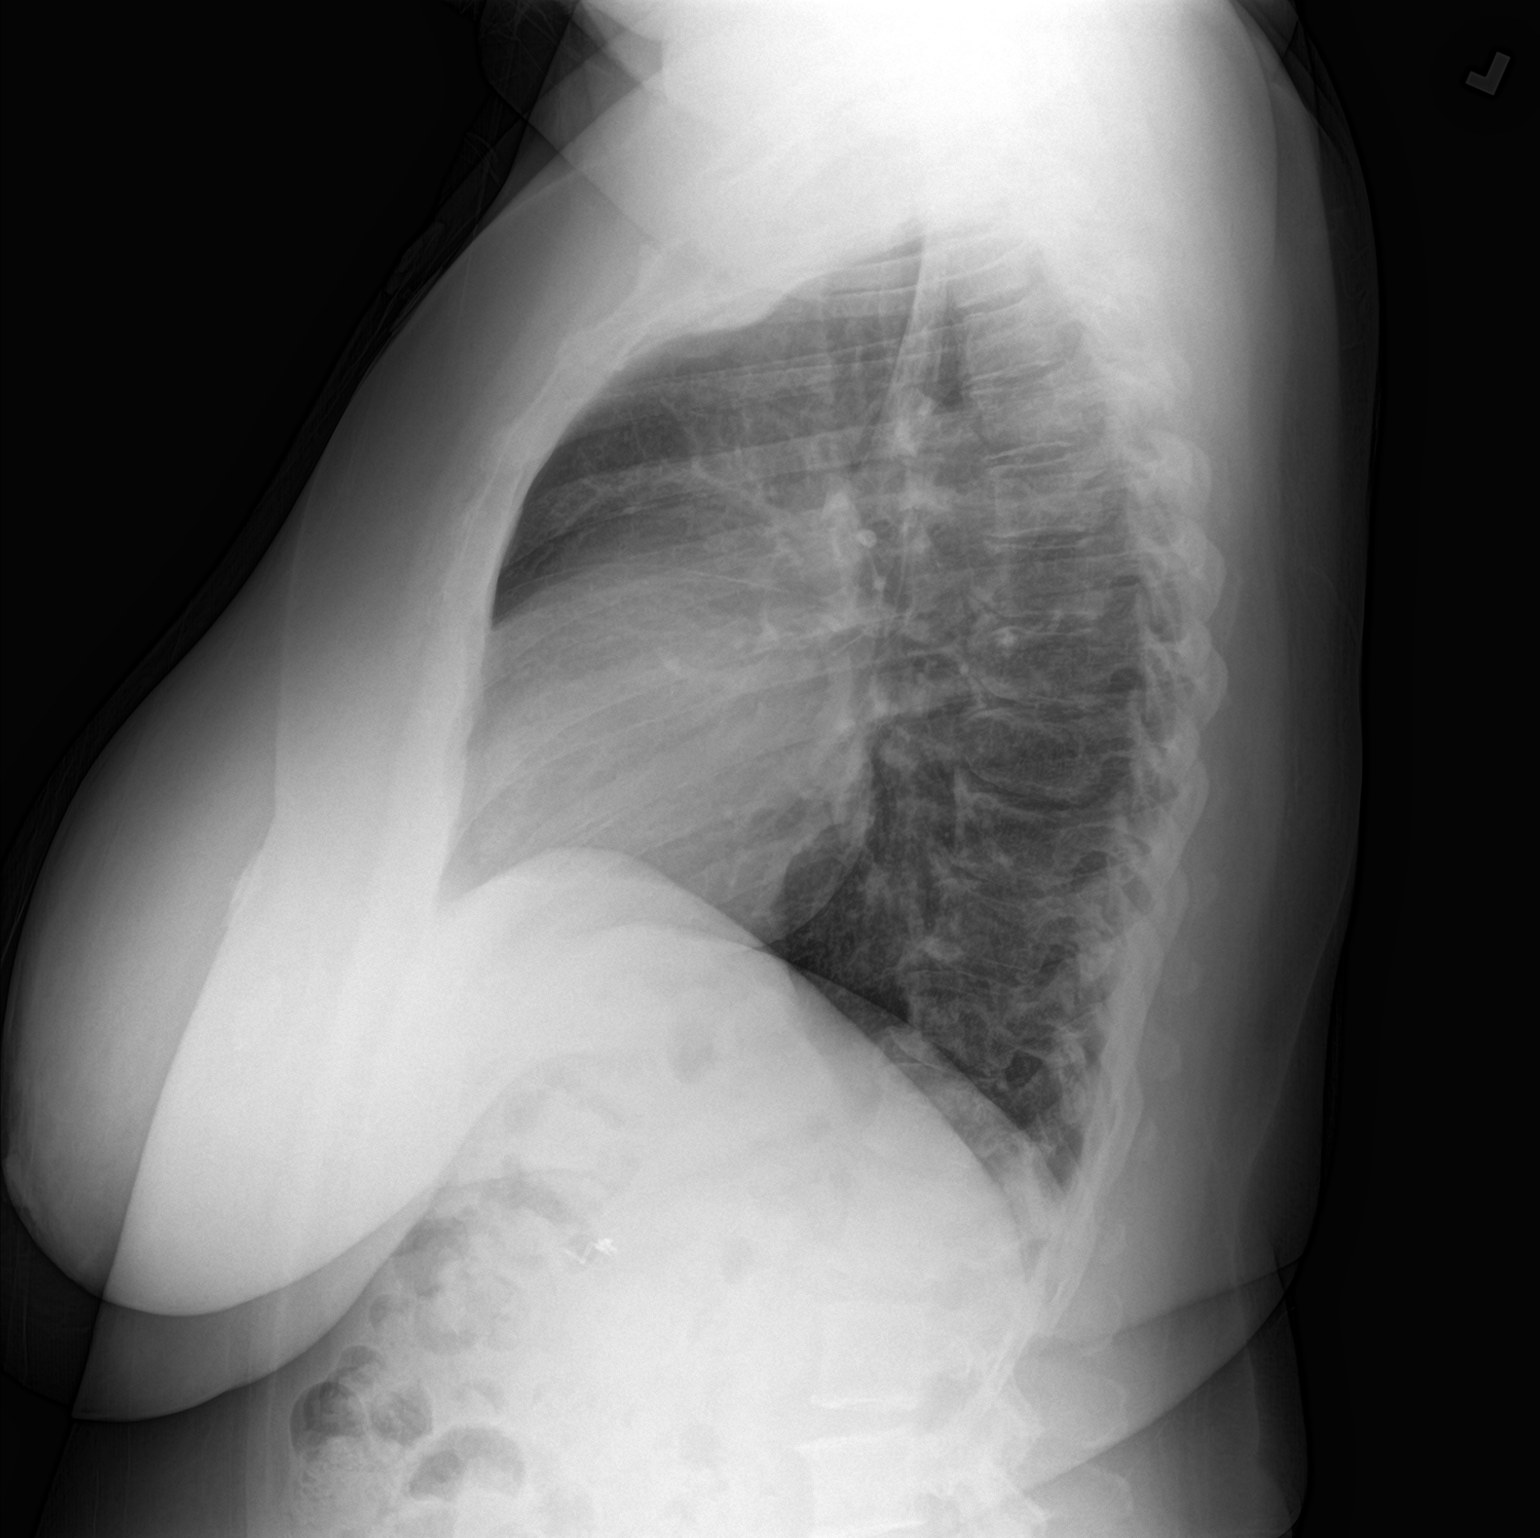

[2 of 2 positions shown; findings below may reference images not displayed]

FINDINGS: The lungs are clear. The pulmonary vasculature is normal. Heart size
is normal. Hilar and mediastinal contours are unremarkable. There is
no pleural effusion.
IMPRESSION: No active cardiopulmonary disease.

## 2018-12-24 ENCOUNTER — Inpatient Hospital Stay: Payer: Self-pay | Admitting: Nurse Practitioner

## 2018-12-24 ENCOUNTER — Telehealth: Payer: Self-pay | Admitting: Nurse Practitioner

## 2018-12-24 ENCOUNTER — Other Ambulatory Visit: Payer: Self-pay | Admitting: Pharmacist

## 2018-12-24 DIAGNOSIS — E039 Hypothyroidism, unspecified: Secondary | ICD-10-CM

## 2018-12-24 MED ORDER — LEVOTHYROXINE SODIUM 50 MCG PO TABS: 50.0000 ug | ORAL_TABLET | Freq: Every day | ORAL | 0 refills | Status: DC

## 2018-12-24 NOTE — Telephone Encounter (Signed)
CMA spoke to patient and informed on the dates of NO SHOW she had. Pt. Is now aware of the dates and was informed she is on standby due 3 or more no show. Pt. Understood and stated she may go establish some where else. She stated she was going to come in to sign a medical release form to get her medical records.

## 2018-12-24 NOTE — Telephone Encounter (Signed)
Patient wanted to get her medical records. Pt. Was informed to sign a medical release form in the office.  Pt. Understood.

## 2018-12-24 NOTE — Telephone Encounter (Signed)
Patient called wanting to know if she could speak to someone in regards to her visit with Dr.Wright in regards to possible COVID contact. Please follow up.

## 2018-12-24 NOTE — Telephone Encounter (Signed)
Patient was advised of standby due to the amount of no shows and patient has requested to speak with CMA for clarification on dates. Please follow up.

## 2019-01-02 ENCOUNTER — Inpatient Hospital Stay: Payer: Self-pay

## 2019-01-06 NOTE — Progress Notes (Deleted)
Subjective:    Patient ID: Linda Phelps, female    DOB: 12/31/1970, 48 y.o.   MRN: [id number]  This is a 48 year old female who went to the emergency room on 26 April after having a previous telephone visit in our clinic on 20 April for a flulike illness.  The patient had a previous history of hypertension, hyperlipidemia, diabetes type 2, heart failure, obesity, reflux, and hypothyroidism.  The patient developed fever cough and chills and had increased shortness of breath but no chest pain.  The cough is very deep and wet in nature.  The patient did present to the emergency room was found to have patchy right-sided pneumonia which was classic for viral type pneumonia.  However she was not hypoxic and despite having had a fever up to 101 degrees she took medication to bring it down she did not show a fever when she went to the emergency room.  Her vital signs are reassuring and her exam was stable and the patient was thus sent home on oral antibiotics and was actually not tested for COVID at that time.  During that period of time there was not enough testing material to be able to test all ambulatory patients and she since she was not admitted she was not tested.  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  She actually has been markedly better over the past several days and actually went back to work on her own on May 5.  This was 7 days from when she was in the emergency room and more than 7 days since she first became ill on April 20.  She states her temperature that she has been recording is 97 degrees.  She states that she has had blood sugars ranging 90-1 10.  She has no body aches.  All of her respiratory symptoms have completely resolved at this time.  The patient has no GI symptoms.  Her urine output is adequate.  She has no dry mouth.  She does not have loss of taste or loss of smell.  She has resolution of all her body aches.   Her cough and shortness of breath have resolved.  She has had no fever as well.   Note the patient did take all of the antibiotics prescribed to her.   Review of Systems  Constitutional: Negative.   HENT: Negative.   Eyes: Positive for itching. Negative for photophobia and pain.  Respiratory: Negative.   Cardiovascular: Negative for chest pain, palpitations and leg swelling.  Gastrointestinal: Negative for diarrhea, nausea and vomiting.  Genitourinary: Negative.   Musculoskeletal: Positive for back pain. Negative for joint swelling and myalgias.  Neurological: Negative.   Psychiatric/Behavioral: Negative.    Observations/Objective: CXR 4/26:    IMPRESSION: Vague foci of opacity in the right thorax, could be secondary to infiltrates. Could consider atypical/possible viral pneumonia in the appropriate clinical setting.  All the labs from recent hospital visit are reviewed  Assessment and Plan: #1 probable COVID-19 infection based on history and physical findings on April 26.  Note the patient has been symptom free since May 5.  Therefore based on this I am in alignment with the patient having to be able to return to work.  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  Her employer is aware that she is back at work and likely had COVID infection.  Note she did not  receive any testing for this.   I would like for this patient to come back to see 1 of our primary care providers in the first of June and I have cleared her to be able to return to work.  The patient states she does not need a letter stating this information is needed.  #2 type 2 diabetes: Blood sugars appear to be well controlled based on the patient's meter readings and she will continue her current program of metformin.  #3 hypertension appears to be well-controlled based on the patient's reports she will continue her current blood pressure medication protocol        Review of Systems     Objective:   Physical Exam        Assessment & Plan:

## 2019-01-07 ENCOUNTER — Ambulatory Visit: Payer: Self-pay | Admitting: Critical Care Medicine

## 2019-02-11 ENCOUNTER — Emergency Department (HOSPITAL_BASED_OUTPATIENT_CLINIC_OR_DEPARTMENT_OTHER)
Admission: EM | Admit: 2019-02-11 | Discharge: 2019-02-12 | Disposition: A | Discharge status: Home / Self Care | Payer: Self-pay | Attending: Emergency Medicine | Admitting: Emergency Medicine

## 2019-02-11 ENCOUNTER — Other Ambulatory Visit: Payer: Self-pay

## 2019-02-11 ENCOUNTER — Encounter (HOSPITAL_BASED_OUTPATIENT_CLINIC_OR_DEPARTMENT_OTHER): Payer: Self-pay | Admitting: *Deleted

## 2019-02-11 DIAGNOSIS — M7918 Myalgia, other site: Secondary | ICD-10-CM | POA: Insufficient documentation

## 2019-02-11 DIAGNOSIS — I110 Hypertensive heart disease with heart failure: Secondary | ICD-10-CM | POA: Insufficient documentation

## 2019-02-11 DIAGNOSIS — K219 Gastro-esophageal reflux disease without esophagitis: Secondary | ICD-10-CM | POA: Insufficient documentation

## 2019-02-11 DIAGNOSIS — Z7984 Long term (current) use of oral hypoglycemic drugs: Secondary | ICD-10-CM | POA: Insufficient documentation

## 2019-02-11 DIAGNOSIS — Z79899 Other long term (current) drug therapy: Secondary | ICD-10-CM | POA: Insufficient documentation

## 2019-02-11 DIAGNOSIS — Z7982 Long term (current) use of aspirin: Secondary | ICD-10-CM | POA: Insufficient documentation

## 2019-02-11 DIAGNOSIS — E119 Type 2 diabetes mellitus without complications: Secondary | ICD-10-CM | POA: Insufficient documentation

## 2019-02-11 DIAGNOSIS — I5022 Chronic systolic (congestive) heart failure: Secondary | ICD-10-CM | POA: Insufficient documentation

## 2019-02-11 DIAGNOSIS — E039 Hypothyroidism, unspecified: Secondary | ICD-10-CM | POA: Insufficient documentation

## 2019-02-11 LAB — CBC WITH DIFFERENTIAL/PLATELET
Abs Immature Granulocytes: 0.01 10*3/uL (ref 0.00–0.07)
Basophils Absolute: 0 10*3/uL (ref 0.0–0.1)
Basophils Relative: 1 %
Eosinophils Absolute: 0.3 10*3/uL (ref 0.0–0.5)
Eosinophils Relative: 4 %
HCT: 41.8 % (ref 36.0–46.0)
Hemoglobin: 13.2 g/dL (ref 12.0–15.0)
Immature Granulocytes: 0 %
Lymphocytes Relative: 52 %
Lymphs Abs: 3.9 10*3/uL (ref 0.7–4.0)
MCH: 29.9 pg (ref 26.0–34.0)
MCHC: 31.6 g/dL (ref 30.0–36.0)
MCV: 94.8 fL (ref 80.0–100.0)
Monocytes Absolute: 0.6 10*3/uL (ref 0.1–1.0)
Monocytes Relative: 9 %
Neutro Abs: 2.5 10*3/uL (ref 1.7–7.7)
Neutrophils Relative %: 34 %
Platelets: 320 10*3/uL (ref 150–400)
RBC: 4.41 MIL/uL (ref 3.87–5.11)
RDW: 12.1 % (ref 11.5–15.5)
WBC: 7.4 10*3/uL (ref 4.0–10.5)
nRBC: 0 % (ref 0.0–0.2)

## 2019-02-11 LAB — COMPREHENSIVE METABOLIC PANEL
ALT: 15 U/L (ref 0–44)
AST: 17 U/L (ref 15–41)
Albumin: 3.8 g/dL (ref 3.5–5.0)
Alkaline Phosphatase: 56 U/L (ref 38–126)
Anion gap: 11 (ref 5–15)
BUN: 23 mg/dL — ABNORMAL HIGH (ref 6–20)
CO2: 25 mmol/L (ref 22–32)
Calcium: 9.1 mg/dL (ref 8.9–10.3)
Chloride: 102 mmol/L (ref 98–111)
Creatinine, Ser: 1.4 mg/dL — ABNORMAL HIGH (ref 0.44–1.00)
GFR calc Af Amer: 51 mL/min — ABNORMAL LOW (ref >60)
GFR calc non Af Amer: 44 mL/min — ABNORMAL LOW (ref >60)
Glucose, Bld: 131 mg/dL — ABNORMAL HIGH (ref 70–99)
Potassium: 3.5 mmol/L (ref 3.5–5.1)
Sodium: 138 mmol/L (ref 135–145)
Total Bilirubin: 0.6 mg/dL (ref 0.3–1.2)
Total Protein: 7.6 g/dL (ref 6.5–8.1)

## 2019-02-11 LAB — URINALYSIS, ROUTINE W REFLEX MICROSCOPIC
Bilirubin Urine: NEGATIVE
Glucose, UA: NEGATIVE mg/dL
Ketones, ur: NEGATIVE mg/dL
Leukocytes,Ua: NEGATIVE
Nitrite: NEGATIVE
Protein, ur: >300 mg/dL — AB
Specific Gravity, Urine: 1.025 (ref 1.005–1.030)
pH: 6 (ref 5.0–8.0)

## 2019-02-11 LAB — URINALYSIS, MICROSCOPIC (REFLEX): WBC, UA: NONE SEEN WBC/hpf (ref 0–5)

## 2019-02-11 NOTE — ED Triage Notes (Addendum)
Right leg pain on and off x 2 months. Diarrhea that she feels is from possible taking too much of her Metformin. Acid reflux. She ran out of her Protonix 3 weeks ago. She started taking Pepcid OTC with no relief.

## 2019-02-12 MED ORDER — LIDOCAINE VISCOUS HCL 2 % MT SOLN
15.0000 mL | Freq: Once | OROMUCOSAL | Status: AC
  Administered 2019-02-12: 15 mL via ORAL
  Filled 2019-02-12: qty 15

## 2019-02-12 MED ORDER — PANTOPRAZOLE SODIUM 40 MG PO TBEC: 40.0000 mg | DELAYED_RELEASE_TABLET | Freq: Every day | ORAL | 0 refills | Status: DC

## 2019-02-12 MED ORDER — ALUM & MAG HYDROXIDE-SIMETH 200-200-20 MG/5ML PO SUSP
30.0000 mL | Freq: Once | ORAL | Status: AC
  Administered 2019-02-12: 30 mL via ORAL
  Filled 2019-02-12: qty 30

## 2019-02-12 NOTE — ED Provider Notes (Addendum)
[facility] Provider Note  CSN: [id number] Arrival date & time: 02/11/19 2035  Chief Complaint(s) Leg Pain and Gastroesophageal Reflux  HPI Linda Phelps is a 48 y.o. female   The history is provided by the patient.  Leg Pain Location:  Leg Injury: no   Leg location:  L upper leg Pain details:    Quality:  Aching   Severity:  Moderate   Onset quality:  Gradual   Progression:  Waxing and waning Prior injury to area:  No Associated symptoms: no back pain, no numbness and no swelling   Risk factors: no frequent fractures and no recent illness   Gastroesophageal Reflux This is a recurrent problem. Episode frequency: intermittently. Progression since onset: fluctuates. Pertinent negatives include no chest pain, no abdominal pain, no headaches and no shortness of breath. Nothing aggravates the symptoms. Nothing relieves the symptoms. Treatments tried: has been out of her protonix.    Past Medical History Past Medical History:  Diagnosis Date   CHF (congestive heart failure) ([place])    Diabetes mellitus without complication ([place])    Ectopic pregnancy    High cholesterol    Hypertension    Low back pain    Obesity    Thyroid disease    Patient Active Problem List   Diagnosis Date Noted   Suspected Covid-19 Virus Infection 10/02/2018   Atypical chest pain 11/18/2017   Chest pain 11/18/2017   Chronic right-sided low back pain without sciatica 07/11/2016   Acute non intractable tension-type headache 05/18/2016   Abnormal chest x-ray 05/18/2016   Vitamin D insufficiency 01/13/2016   Menstrual cycle problem 01/11/2016   Hordeolum externum (stye) 01/11/2016   Obesity (BMI 30.0-34.9) 54/27/0623   Chronic systolic heart failure ([place]) 09/21/2015   History of noncompliance with medical treatment 09/21/2015   Long-term use of aspirin therapy 09/21/2015   Cardiomyopathy, peripartum, delivered 09/21/2015   Palpitations 07/26/2015     GERD (gastroesophageal reflux disease) 07/26/2015   Generalized anxiety disorder 07/26/2015   Poor dentition 04/13/2015   Cardiomegaly 09/23/2014   Diabetes mellitus type 2 in obese ([place]) 07/03/2014   Essential hypertension 07/03/2014   Hyperlipidemia 07/03/2014   Hypothyroidism 07/03/2014   Home Medication(s) Prior to Admission medications   Medication Sig Start Date End Date Taking? Authorizing Provider  acetaminophen (TYLENOL) 500 MG tablet Take 2 tablets (1,000 mg total) by mouth every 6 (six) hours as needed for mild pain or moderate pain. 01/08/17   Alfonse Spruce, FNP  amLODipine (NORVASC) 10 MG tablet Take 1 tablet (10 mg total) by mouth daily. 07/03/18   Gildardo Pounds, NP  aspirin 81 MG tablet Take 1 tablet (81 mg total) by mouth daily. 10/17/17   Argentina Donovan, PA-C  Blood Glucose Monitoring Suppl (TRUE METRIX METER) w/Device KIT Use as instructed. Please monitor blood glucose levels fasting and 1 hour after lunch and dinner. 07/03/18   Gildardo Pounds, NP  carvedilol (COREG) 12.5 MG tablet Take 1 tablet (12.5 mg total) by mouth 2 (two) times daily with a meal. 11/15/18   Charlott Rakes, MD  diclofenac sodium (VOLTAREN) 1 % GEL Apply 2 g topically 4 (four) times daily. 11/14/18   Long, Wonda Olds, MD  fluticasone (FLONASE) 50 MCG/ACT nasal spray Place 2 sprays into both nostrils daily. 11/28/18   Gildardo Pounds, NP  furosemide (LASIX) 20 MG tablet Take 1 tablet (20 mg total) by mouth 2 (two) times daily. 11/28/18   Gildardo Pounds, NP  glucose  blood (TRUE METRIX BLOOD GLUCOSE TEST) test strip Use as instructed. Please monitor blood glucose levels fasting and 1 hour after lunch and dinner. 07/03/18   Gildardo Pounds, NP  levothyroxine (SYNTHROID) 50 MCG tablet Take 1 tablet (50 mcg total) by mouth daily. Needs office visit for more refills. 12/24/18 01/23/19  Charlott Rakes, MD  lisinopril (ZESTRIL) 40 MG tablet Take 1 tablet (40 mg total) by mouth daily. 12/02/18    Gildardo Pounds, NP  metFORMIN (GLUCOPHAGE) 1000 MG tablet Take 1 tablet (1,000 mg total) by mouth 2 (two) times daily with a meal. 11/28/18   Gildardo Pounds, NP  ondansetron (ZOFRAN ODT) 8 MG disintegrating tablet Take 1 tablet (8 mg total) by mouth every 8 (eight) hours as needed for nausea or vomiting. 07/20/17   Molpus, John, MD  pantoprazole (PROTONIX) 40 MG tablet Take 1 tablet (40 mg total) by mouth daily. 02/12/19 03/14/19  Fatima Blank, MD  ranitidine (ZANTAC) 300 MG tablet Take 300 mg by mouth at bedtime. 04/29/18   [provider]  triamcinolone cream (KENALOG) 0.1 % Apply 1 application topically 2 (two) times daily. Patient not taking: Reported on 10/02/2018 10/17/17   Argentina Donovan, PA-C  TRUEPLUS LANCETS 28G MISC Use as instructed. Please monitor blood glucose levels fasting and 1 hour after lunch and dinner. 07/03/18   Gildardo Pounds, NP                                                                                                                                    Past Surgical History Past Surgical History:  Procedure Laterality Date   APPENDECTOMY     CHOLECYSTECTOMY     Family History Family History  Problem Relation Age of Onset   Diabetes Mother    Diabetes Maternal Grandmother    Colon cancer Maternal Uncle 37    Social History Social History   Tobacco Use   Smoking status: Never Smoker   Smokeless tobacco: Never Used  Substance Use Topics   Alcohol use: Yes    Alcohol/week: 0.0 standard drinks    Comment: occ   Drug use: No   Allergies Ibuprofen  Review of Systems Review of Systems  Respiratory: Negative for shortness of breath.   Cardiovascular: Negative for chest pain.  Gastrointestinal: Negative for abdominal pain.  Musculoskeletal: Negative for back pain.  Neurological: Negative for headaches.   All other systems are reviewed and are negative for acute change except as noted in the HPI  Physical Exam Vital Signs    I have reviewed the triage vital signs BP (!) 189/89    Pulse 78    Temp 98.3 F (36.8 C) (Oral)    Resp 16    Ht 5' (1.524 m)    Wt 86.2 kg    LMP 10/08/2013    SpO2 100%    BMI 37.11 kg/m   Physical Exam Vitals signs  reviewed.  Constitutional:      General: She is not in acute distress.    Appearance: She is well-developed. She is not diaphoretic.  HENT:     Head: Normocephalic and atraumatic.     Right Ear: External ear normal.     Left Ear: External ear normal.     Nose: Nose normal.     Mouth/Throat:     Pharynx: Oropharynx is clear.  Eyes:     General: No scleral icterus.    Conjunctiva/sclera: Conjunctivae normal.  Neck:     Musculoskeletal: Normal range of motion.     Trachea: Phonation normal.  Cardiovascular:     Rate and Rhythm: Normal rate and regular rhythm.  Pulmonary:     Effort: Pulmonary effort is normal. No respiratory distress.     Breath sounds: No stridor.  Abdominal:     General: There is no distension.     Tenderness: There is no abdominal tenderness.  Musculoskeletal: Normal range of motion.     Right hip: She exhibits tenderness. She exhibits normal range of motion, normal strength, no bony tenderness and no deformity.       Legs:  Neurological:     Mental Status: She is alert and oriented to person, place, and time.  Psychiatric:        Behavior: Behavior normal.     ED Results and Treatments Labs (all labs ordered are listed, but only abnormal results are displayed) Labs Reviewed  URINALYSIS, ROUTINE W REFLEX MICROSCOPIC - Abnormal; Notable for the following components:      Result Value   Hgb urine dipstick TRACE (*)    Protein, ur >300 (*)    All other components within normal limits  COMPREHENSIVE METABOLIC PANEL - Abnormal; Notable for the following components:   Glucose, Bld 131 (*)    BUN 23 (*)    Creatinine, Ser 1.40 (*)    GFR calc non Af Amer 44 (*)    GFR calc Af Amer 51 (*)    All other components within normal limits   URINALYSIS, MICROSCOPIC (REFLEX) - Abnormal; Notable for the following components:   Bacteria, UA RARE (*)    All other components within normal limits  CBC WITH DIFFERENTIAL/PLATELET                                                                                                                         EKG  EKG Interpretation  Date/Time:    Ventricular Rate:    PR Interval:    QRS Duration:   QT Interval:    QTC Calculation:   R Axis:     Text Interpretation:        Radiology No results found.  Pertinent labs & imaging results that were available during my care of the patient were reviewed by me and considered in my medical decision making (see chart for details).  Medications Ordered in ED Medications  alum & mag hydroxide-simeth (MAALOX/MYLANTA) 200-200-20 MG/5ML  suspension 30 mL (has no administration in time range)    And  lidocaine (XYLOCAINE) 2 % viscous mouth solution 15 mL (has no administration in time range)                                                                                                                                    Procedures Procedures  (including critical care time)  Medical Decision Making / ED Course I have reviewed the nursing notes for this encounter and the patient's prior records (if available in EHR or on provided paperwork).   Janmarie Smoot was evaluated in Emergency Department on 02/12/2019 for the symptoms described in the history of present illness. She was evaluated in the context of the global COVID-19 pandemic, which necessitated consideration that the patient might be at risk for infection with the SARS-CoV-2 virus that causes COVID-19. Institutional protocols and algorithms that pertain to the evaluation of patients at risk for COVID-19 are in a state of rapid change based on information released by regulatory bodies including the CDC and federal and state organizations. These policies and algorithms were followed during the  patient's care in the ED.  1. Leg pain Ongoing for 2 months. Appears to be muscular, likely piriformis. Doubt DVT, aortic occusion.  Stretches, OTC meds. PCP f/u.  2. Reflux Throat clear. abd benign. Out of her protonix. Screening labs reassuring. No CP, SOB suspicious for cardiac etiology. Gi cocktail.  The patient appears reasonably screened and/or stabilized for discharge and I doubt any other medical condition or other [facility] requiring further screening, evaluation, or treatment in the ED at this time prior to discharge.  The patient is safe for discharge with strict return precautions.       Final Clinical Impression(s) / ED Diagnoses Final diagnoses:  Gastroesophageal reflux disease, esophagitis presence not specified  Gluteal pain     The patient appears reasonably screened and/or stabilized for discharge and I doubt any other medical condition or other [facility] requiring further screening, evaluation, or treatment in the ED at this time prior to discharge.  Disposition: Discharge  Condition: Good  I have discussed the results, Dx and Tx plan with the patient who expressed understanding and agree(s) with the plan. Discharge instructions discussed at great length. The patient was given strict return precautions who verbalized understanding of the instructions. No further questions at time of discharge.    ED Discharge Orders         Ordered    pantoprazole (PROTONIX) 40 MG tablet  Daily     02/12/19 0022           Follow Up: Gildardo Pounds, NP [place] [id number] [phone]        This chart was dictated using voice recognition software.  Despite best efforts to proofread,  errors can occur which can change the documentation meaning.   Fatima Blank, MD 02/12/19  [id number]   Addendum: physical exam   Ilze Roselli, Grayce Sessions, MD 02/19/19 [phone]

## 2019-03-27 ENCOUNTER — Other Ambulatory Visit: Payer: Self-pay

## 2019-03-27 ENCOUNTER — Ambulatory Visit: Payer: Self-pay | Attending: Internal Medicine | Admitting: Internal Medicine

## 2019-03-27 DIAGNOSIS — E1169 Type 2 diabetes mellitus with other specified complication: Secondary | ICD-10-CM

## 2019-03-27 DIAGNOSIS — I10 Essential (primary) hypertension: Secondary | ICD-10-CM

## 2019-03-27 DIAGNOSIS — K219 Gastro-esophageal reflux disease without esophagitis: Secondary | ICD-10-CM

## 2019-03-27 DIAGNOSIS — Z23 Encounter for immunization: Secondary | ICD-10-CM

## 2019-03-27 DIAGNOSIS — E039 Hypothyroidism, unspecified: Secondary | ICD-10-CM

## 2019-03-27 DIAGNOSIS — E669 Obesity, unspecified: Secondary | ICD-10-CM

## 2019-03-27 MED ORDER — CARVEDILOL 12.5 MG PO TABS: 12.5000 mg | ORAL_TABLET | Freq: Two times a day (BID) | ORAL | 1 refills | Status: DC

## 2019-03-27 MED ORDER — PANTOPRAZOLE SODIUM 40 MG PO TBEC: 40.0000 mg | DELAYED_RELEASE_TABLET | Freq: Every day | ORAL | 1 refills | Status: DC

## 2019-03-27 MED ORDER — LEVOTHYROXINE SODIUM 50 MCG PO TABS: 50.0000 ug | ORAL_TABLET | Freq: Every day | ORAL | 1 refills | Status: DC

## 2019-03-27 MED ORDER — AMLODIPINE BESYLATE 10 MG PO TABS: 10.0000 mg | ORAL_TABLET | Freq: Every day | ORAL | 1 refills | Status: DC

## 2019-03-27 MED ORDER — METFORMIN HCL 1000 MG PO TABS: 1000.0000 mg | ORAL_TABLET | Freq: Two times a day (BID) | ORAL | 0 refills | Status: DC

## 2019-03-27 MED ORDER — FUROSEMIDE 20 MG PO TABS: 20.0000 mg | ORAL_TABLET | Freq: Two times a day (BID) | ORAL | 1 refills | Status: DC

## 2019-03-27 MED ORDER — LISINOPRIL 40 MG PO TABS: 40.0000 mg | ORAL_TABLET | Freq: Every day | ORAL | 0 refills | Status: DC

## 2019-03-27 NOTE — Progress Notes (Signed)
Virtual Visit via Telephone Note Due to current restrictions/limitations of in-office visits due to the COVID-19 pandemic, this scheduled clinical appointment was converted to a telehealth visit  I connected with Linda Phelps on 03/27/19 at 8:41 a.m by telephone and verified that I am speaking with the correct person using two identifiers. I am in my office.  The patient is at home.  Only the patient and myself participated in this encounter.  I discussed the limitations, risks, security and privacy concerns of performing an evaluation and management service by telephone and the availability of in person appointments. I also discussed with the patient that there may be a patient responsible charge related to this service. The patient expressed understanding and agreed to proceed.   History of Present Illness: Pt with hx of HTN, DM, HL, obesity.  Purpose of today's visit is request for medication refills. PCP is [facility].  Patient states she is almost out of most of her medications and was told by the pharmacy that she needed appointment to get refills.  HTN:  Checks BP daily.  Last BP was 170/90.   Out of Norvasc for a while No CP/SOB/LE edema  DM: gives range 80s. Compliant with Metformin Eating less.  Loss b/w 10-12 lbs so far Not getting in any exercise outside of work.  Work from 5 p.m to 2 a.m at [employer].  Does a lot of walking at work.    Hypothyroid:  Reports she has been taking the Levothyroxine daily.  No feeling of being hot/cold all the time.  No palpitations  GERD: doing well on Protonix.  Zantac on med list but patient states that was discontinued after recent recall. Outpatient Encounter Medications as of 03/27/2019  Medication Sig  . acetaminophen (TYLENOL) 500 MG tablet Take 2 tablets (1,000 mg total) by mouth every 6 (six) hours as needed for mild pain or moderate pain.  Marland Kitchen amLODipine (NORVASC) 10 MG tablet Take 1 tablet (10 mg total) by mouth daily.  Marland Kitchen aspirin 81 MG  tablet Take 1 tablet (81 mg total) by mouth daily.  . Blood Glucose Monitoring Suppl (TRUE METRIX METER) w/Device KIT Use as instructed. Please monitor blood glucose levels fasting and 1 hour after lunch and dinner.  . carvedilol (COREG) 12.5 MG tablet Take 1 tablet (12.5 mg total) by mouth 2 (two) times daily with a meal.  . diclofenac sodium (VOLTAREN) 1 % GEL Apply 2 g topically 4 (four) times daily.  . fluticasone (FLONASE) 50 MCG/ACT nasal spray Place 2 sprays into both nostrils daily.  . furosemide (LASIX) 20 MG tablet Take 1 tablet (20 mg total) by mouth 2 (two) times daily.  Marland Kitchen glucose blood (TRUE METRIX BLOOD GLUCOSE TEST) test strip Use as instructed. Please monitor blood glucose levels fasting and 1 hour after lunch and dinner.  . levothyroxine (SYNTHROID) 50 MCG tablet Take 1 tablet (50 mcg total) by mouth daily. Needs office visit for more refills.  Marland Kitchen lisinopril (ZESTRIL) 40 MG tablet Take 1 tablet (40 mg total) by mouth daily.  . metFORMIN (GLUCOPHAGE) 1000 MG tablet Take 1 tablet (1,000 mg total) by mouth 2 (two) times daily with a meal.  . ondansetron (ZOFRAN ODT) 8 MG disintegrating tablet Take 1 tablet (8 mg total) by mouth every 8 (eight) hours as needed for nausea or vomiting.  . pantoprazole (PROTONIX) 40 MG tablet Take 1 tablet (40 mg total) by mouth daily.  . ranitidine (ZANTAC) 300 MG tablet Take 300 mg by mouth at  bedtime.  . triamcinolone cream (KENALOG) 0.1 % Apply 1 application topically 2 (two) times daily. (Patient not taking: Reported on 10/02/2018)  . TRUEPLUS LANCETS 28G MISC Use as instructed. Please monitor blood glucose levels fasting and 1 hour after lunch and dinner.   No facility-administered encounter medications on file as of 03/27/2019.     Observations/Objective:  No direct observation done as this was a telephone encounter. Lab Results  Component Value Date   WBC 7.4 02/11/2019   HGB 13.2 02/11/2019   HCT 41.8 02/11/2019   MCV 94.8 02/11/2019   PLT  320 02/11/2019     Chemistry      Component Value Date/Time   NA 138 02/11/2019 2053   NA 144 07/03/2018 1149   K 3.5 02/11/2019 2053   CL 102 02/11/2019 2053   CO2 25 02/11/2019 2053   BUN 23 (H) 02/11/2019 2053   BUN 15 07/03/2018 1149   CREATININE 1.40 (H) 02/11/2019 2053   CREATININE 1.21 (H) 07/11/2016 1419      Component Value Date/Time   CALCIUM 9.1 02/11/2019 2053   ALKPHOS 56 02/11/2019 2053   AST 17 02/11/2019 2053   ALT 15 02/11/2019 2053   BILITOT 0.6 02/11/2019 2053   BILITOT 0.5 07/03/2018 1149       Assessment and Plan: 1. Diabetes mellitus type 2 in obese [facility]) Reported blood sugars are good.  Refill given on metformin. - metFORMIN (GLUCOPHAGE) 1000 MG tablet; Take 1 tablet (1,000 mg total) by mouth 2 (two) times daily with a meal.  Dispense: 180 tablet; Refill: 0  2. Essential hypertension Reported blood pressure not at goal.  She has been out of one of her blood pressure medications.  Refills given on all of them.  Advised low-salt diet. - amLODipine (NORVASC) 10 MG tablet; Take 1 tablet (10 mg total) by mouth daily.  Dispense: 30 tablet; Refill: 1 - carvedilol (COREG) 12.5 MG tablet; Take 1 tablet (12.5 mg total) by mouth 2 (two) times daily with a meal.  Dispense: 60 tablet; Refill: 1 - furosemide (LASIX) 20 MG tablet; Take 1 tablet (20 mg total) by mouth 2 (two) times daily.  Dispense: 60 tablet; Refill: 1 - lisinopril (ZESTRIL) 40 MG tablet; Take 1 tablet (40 mg total) by mouth daily.  Dispense: 90 tablet; Refill: 0  3. Hypothyroidism, unspecified type - levothyroxine (SYNTHROID) 50 MCG tablet; Take 1 tablet (50 mcg total) by mouth daily.  Dispense: 30 tablet; Refill: 1  4. Gastroesophageal reflux disease without esophagitis - pantoprazole (PROTONIX) 40 MG tablet; Take 1 tablet (40 mg total) by mouth daily.  Dispense: 30 tablet; Refill: 1  5. Need for influenza vaccination 6. Need for vaccination against Streptococcus pneumoniae CMA will schedule  appointment for her to see the clinical pharmacist to have these vaccines administered.   Follow Up Instructions: F/u with PCP Linda Phelps in 6-7 wks   I discussed the assessment and treatment plan with the patient. The patient was provided an opportunity to ask questions and all were answered. The patient agreed with the plan and demonstrated an understanding of the instructions.   The patient was advised to call back or seek an in-person evaluation if the symptoms worsen or if the condition fails to improve as anticipated.  I provided 9 minutes of non-face-to-face time during this encounter.   Karle Plumber, MD

## 2019-04-15 ENCOUNTER — Ambulatory Visit: Payer: Self-pay | Admitting: Pharmacist

## 2019-05-01 ENCOUNTER — Ambulatory Visit: Payer: Self-pay | Admitting: Internal Medicine

## 2019-05-05 ENCOUNTER — Ambulatory Visit: Payer: Self-pay | Admitting: Nurse Practitioner

## 2019-05-26 ENCOUNTER — Encounter: Payer: Self-pay | Admitting: Critical Care Medicine

## 2019-05-26 ENCOUNTER — Ambulatory Visit: Payer: Self-pay | Attending: Critical Care Medicine | Admitting: Critical Care Medicine

## 2019-05-26 ENCOUNTER — Other Ambulatory Visit: Payer: Self-pay

## 2019-05-26 DIAGNOSIS — E7800 Pure hypercholesterolemia, unspecified: Secondary | ICD-10-CM

## 2019-05-26 DIAGNOSIS — I10 Essential (primary) hypertension: Secondary | ICD-10-CM

## 2019-05-26 DIAGNOSIS — E1169 Type 2 diabetes mellitus with other specified complication: Secondary | ICD-10-CM

## 2019-05-26 DIAGNOSIS — I5022 Chronic systolic (congestive) heart failure: Secondary | ICD-10-CM

## 2019-05-26 DIAGNOSIS — E039 Hypothyroidism, unspecified: Secondary | ICD-10-CM

## 2019-05-26 DIAGNOSIS — E669 Obesity, unspecified: Secondary | ICD-10-CM

## 2019-05-26 DIAGNOSIS — E559 Vitamin D deficiency, unspecified: Secondary | ICD-10-CM

## 2019-05-26 DIAGNOSIS — K219 Gastro-esophageal reflux disease without esophagitis: Secondary | ICD-10-CM

## 2019-05-26 MED ORDER — FUROSEMIDE 20 MG PO TABS: 20.0000 mg | ORAL_TABLET | Freq: Two times a day (BID) | ORAL | 1 refills | Status: DC

## 2019-05-26 MED ORDER — FLUTICASONE PROPIONATE 50 MCG/ACT NA SUSP: 2.0000 | Freq: Every day | NASAL | 2 refills | Status: DC

## 2019-05-26 MED ORDER — LEVOTHYROXINE SODIUM 50 MCG PO TABS: 50.0000 ug | ORAL_TABLET | Freq: Every day | ORAL | 1 refills | Status: DC

## 2019-05-26 MED ORDER — LISINOPRIL 40 MG PO TABS: 40.0000 mg | ORAL_TABLET | Freq: Every day | ORAL | 0 refills | Status: DC

## 2019-05-26 MED ORDER — CARVEDILOL 12.5 MG PO TABS: 12.5000 mg | ORAL_TABLET | Freq: Two times a day (BID) | ORAL | 1 refills | Status: DC

## 2019-05-26 MED ORDER — AMLODIPINE BESYLATE 10 MG PO TABS: 10.0000 mg | ORAL_TABLET | Freq: Every day | ORAL | 1 refills | Status: DC

## 2019-05-26 MED ORDER — METFORMIN HCL 1000 MG PO TABS: 1000.0000 mg | ORAL_TABLET | Freq: Two times a day (BID) | ORAL | 0 refills | Status: DC

## 2019-05-26 MED ORDER — PANTOPRAZOLE SODIUM 40 MG PO TBEC: 40.0000 mg | DELAYED_RELEASE_TABLET | Freq: Every day | ORAL | 1 refills | Status: DC

## 2019-05-26 NOTE — Progress Notes (Signed)
Patient ID: Linda Phelps, female   DOB: 02-20-1971, 49 y.o.   MRN: [id number] Virtual Visit via Telephone Note  I connected with Linda Phelps on 05/26/19 at 10:00 AM EST by telephone and verified that I am speaking with the correct person using two identifiers.   Consent:  I discussed the limitations, risks, security and privacy concerns of performing an evaluation and management service by telephone and the availability of in person appointments. I also discussed with the patient that there may be a patient responsible charge related to this service. The patient expressed understanding and agreed to proceed.  Location of patient: at home    Location of provider:In my office  Persons participating in the televisit with the patient.   No one else on the call    History of Present Illness: This is a 49 year old female with pre-existing history of diabetes hypertension hyperlipidemia and history of chronic diastolic heart failure.  The patient in the past has had increased ejection fraction of over 123456 and mild diastolic dysfunction on echocardiogram from July 2019.  The patient had episodes of chest pain earlier in 2020 was found to have significant reflux and is responded to Protonix.  Her diabetes has been well controlled with blood sugars in the 80-1 10 range and her weight is down to 170 from previous values in the 190 range.  She is having some palpitations and would still like to see cardiology to clear the air around the history of heart failure.  She had gestational heart failure 15 years ago.  The heart failure diagnosis still exist on her problem list.  The patient also is interested in having her urine evaluated again for protein.  She has no other complaints at this time.  Today's visit primarily is to have all of her medications refilled.  She prefers her meds to go to the [facility].  Constitutional:   No  weight loss, night sweats,  Fevers, chills, fatigue,  lassitude. HEENT:   No headaches,  Difficulty swallowing,  Tooth/dental problems,  Sore throat,                No sneezing, itching, ear ache, nasal congestion, post nasal drip,   CV:  chest pain,  Orthopnea, PND, swelling in lower extremities, anasarca, dizziness, palpitations  GI  No heartburn, indigestion, abdominal pain, nausea, vomiting, diarrhea, change in bowel habits, loss of appetite  Resp: No shortness of breath with exertion or at rest.  No excess mucus, no productive cough,  No non-productive cough,  No coughing up of blood.  No change in color of mucus.  No wheezing.  No chest wall deformity  Skin: no rash or lesions.  GU: no dysuria, change in color of urine, no urgency or frequency.  No flank pain.  MS:  No joint pain or swelling.  No decreased range of motion.  No back pain.  Psych:  No change in mood or affect. No depression or anxiety.  No memory loss.    Observations/Objective: No observations this is a telephone visit  I did spend time reviewing all the patient's prior echocardiograms and venous Doppler ultrasounds and ECGs  Assessment and Plan: #1 diabetes type 2 well controlled and the patient has adequate testing supplies we will go ahead and refill the Metformin and obtain a hemoglobin A1c along with urine microalbumin and urinalysis and complete metabolic panel  #2 hypertension: The patient does not monitor her blood pressure at home but last value in September was  normal  Plan to be to refill the amlodipine and the lisinopril and the Coreg along with furosemide  #3 history of chronic diastolic heart failure: The patient still had evidence of diastolic dysfunction in July 2019 and the patient is having palpitation she would like for cardiology referral and thus we will order a referral to cardiology  #4 hypothyroidism: We will refill the Synthroid  #5 hyperlipidemia not on current therapy we will check lipid panel as well with lab draws  Follow Up  Instructions: The patient knows will be a lab visit for vaccines for flu Tdap and Pneumovax as well as lab draw on January 11 in the morning and a follow-up visit with Dr. Wynetta Emery later in this month or early in February Also referral to cardiology will be made   I discussed the assessment and treatment plan with the patient. The patient was provided an opportunity to ask questions and all were answered. The patient agreed with the plan and demonstrated an understanding of the instructions.   The patient was advised to call back or seek an in-person evaluation if the symptoms worsen or if the condition fails to improve as anticipated.  I provided 30 minutes of non-face-to-face time during this encounter  including  median intraservice time , review of notes, labs, imaging, medications  and explaining diagnosis and management to the patient .    Asencion Noble, MD

## 2019-05-29 ENCOUNTER — Ambulatory Visit: Payer: Self-pay | Attending: Internal Medicine | Admitting: Physician Assistant

## 2019-05-29 DIAGNOSIS — H00019 Hordeolum externum unspecified eye, unspecified eyelid: Secondary | ICD-10-CM

## 2019-05-29 DIAGNOSIS — B379 Candidiasis, unspecified: Secondary | ICD-10-CM

## 2019-05-29 DIAGNOSIS — E669 Obesity, unspecified: Secondary | ICD-10-CM

## 2019-05-29 DIAGNOSIS — E1169 Type 2 diabetes mellitus with other specified complication: Secondary | ICD-10-CM

## 2019-05-29 MED ORDER — FLUCONAZOLE 150 MG PO TABS: 150.0000 mg | ORAL_TABLET | Freq: Once | ORAL | 0 refills | Status: AC

## 2019-05-29 MED ORDER — CEPHALEXIN 500 MG PO CAPS: 500.0000 mg | ORAL_CAPSULE | Freq: Three times a day (TID) | ORAL | 0 refills | Status: DC

## 2019-05-29 NOTE — Progress Notes (Signed)
Patient ID: Linda Phelps, female   DOB: [birth year], 49 y.o.   MRN: [id number] Virtual Visit via Telephone Note  I connected with Dorcas Carrow on 05/29/19 at  9:10 AM EST by telephone and verified that I am speaking with the correct person using two identifiers.   I discussed the limitations, risks, security and privacy concerns of performing an evaluation and management service by telephone and the availability of in person appointments. I also discussed with the patient that there may be a patient responsible charge related to this service. The patient expressed understanding and agreed to proceed.  Patient location:  home My Location:  [place] office Persons on the call:  Me and the patient   History of Present Illness:  Patient is complaining of stye in inner canthus of B eyes.  Tender.  Vision not affected.  No fever.  No purulent drainage.  Missed work the last 2 days  Blood sugars running 80-90s.     Observations/Objective:  A&Ox3  Assessment and Plan:   1. Diabetes mellitus type 2 in obese ([place]) Controlled-patient has lost 12 pounds with diet changes and exercise and blood sugars are great!!!  2. Hordeolum externum, unspecified laterality compresses - cephALEXin (KEFLEX) 500 MG capsule; Take 1 capsule (500 mg total) by mouth 3 (three) times daily.  Dispense: 21 capsule; Refill: 0  3. Yeast infection If develops secondary to antibiotics - fluconazole (DIFLUCAN) 150 MG tablet; Take 1 tablet (150 mg total) by mouth once for 1 dose.  Dispense: 1 tablet; Refill: 0     Follow Up Instructions: See PCP in 6-8 weeks   I discussed the assessment and treatment plan with the patient. The patient was provided an opportunity to ask questions and all were answered. The patient agreed with the plan and demonstrated an understanding of the instructions.   The patient was advised to call back or seek an in-person evaluation if the symptoms worsen or if the condition fails to improve as  anticipated.  I provided 9 minutes of non-face-to-face time during this encounter.   Freeman Caldron, PA-C

## 2019-05-29 NOTE — Progress Notes (Signed)
Patient has been called and DOB has been verified. Patient has been screened and transferred to PCP to start phone visit.    Stye on both eyes.

## 2019-06-05 ENCOUNTER — Encounter (HOSPITAL_BASED_OUTPATIENT_CLINIC_OR_DEPARTMENT_OTHER): Payer: Self-pay | Admitting: *Deleted

## 2019-06-05 ENCOUNTER — Emergency Department (HOSPITAL_BASED_OUTPATIENT_CLINIC_OR_DEPARTMENT_OTHER): Payer: Self-pay

## 2019-06-05 ENCOUNTER — Other Ambulatory Visit: Payer: Self-pay

## 2019-06-05 ENCOUNTER — Emergency Department (HOSPITAL_BASED_OUTPATIENT_CLINIC_OR_DEPARTMENT_OTHER)
Admission: EM | Admit: 2019-06-05 | Discharge: 2019-06-05 | Disposition: A | Discharge status: Home / Self Care | Payer: Self-pay | Attending: Emergency Medicine | Admitting: Emergency Medicine

## 2019-06-05 DIAGNOSIS — R0789 Other chest pain: Secondary | ICD-10-CM | POA: Insufficient documentation

## 2019-06-05 DIAGNOSIS — I110 Hypertensive heart disease with heart failure: Secondary | ICD-10-CM | POA: Insufficient documentation

## 2019-06-05 DIAGNOSIS — I5022 Chronic systolic (congestive) heart failure: Secondary | ICD-10-CM | POA: Insufficient documentation

## 2019-06-05 DIAGNOSIS — E119 Type 2 diabetes mellitus without complications: Secondary | ICD-10-CM | POA: Insufficient documentation

## 2019-06-05 DIAGNOSIS — E039 Hypothyroidism, unspecified: Secondary | ICD-10-CM | POA: Insufficient documentation

## 2019-06-05 DIAGNOSIS — Z7982 Long term (current) use of aspirin: Secondary | ICD-10-CM | POA: Insufficient documentation

## 2019-06-05 DIAGNOSIS — Z79899 Other long term (current) drug therapy: Secondary | ICD-10-CM | POA: Insufficient documentation

## 2019-06-05 DIAGNOSIS — M7918 Myalgia, other site: Secondary | ICD-10-CM | POA: Insufficient documentation

## 2019-06-05 LAB — CBC WITH DIFFERENTIAL/PLATELET
Abs Immature Granulocytes: 0.01 10*3/uL (ref 0.00–0.07)
Basophils Absolute: 0 10*3/uL (ref 0.0–0.1)
Basophils Relative: 1 %
Eosinophils Absolute: 0.2 10*3/uL (ref 0.0–0.5)
Eosinophils Relative: 3 %
HCT: 38.9 % (ref 36.0–46.0)
Hemoglobin: 12.6 g/dL (ref 12.0–15.0)
Immature Granulocytes: 0 %
Lymphocytes Relative: 49 %
Lymphs Abs: 3 10*3/uL (ref 0.7–4.0)
MCH: 30.4 pg (ref 26.0–34.0)
MCHC: 32.4 g/dL (ref 30.0–36.0)
MCV: 93.7 fL (ref 80.0–100.0)
Monocytes Absolute: 0.8 10*3/uL (ref 0.1–1.0)
Monocytes Relative: 13 %
Neutro Abs: 2.1 10*3/uL (ref 1.7–7.7)
Neutrophils Relative %: 34 %
Platelets: 321 10*3/uL (ref 150–400)
RBC: 4.15 MIL/uL (ref 3.87–5.11)
RDW: 12.5 % (ref 11.5–15.5)
WBC: 6 10*3/uL (ref 4.0–10.5)
nRBC: 0 % (ref 0.0–0.2)

## 2019-06-05 LAB — COMPREHENSIVE METABOLIC PANEL
ALT: 15 U/L (ref 0–44)
AST: 16 U/L (ref 15–41)
Albumin: 3.7 g/dL (ref 3.5–5.0)
Alkaline Phosphatase: 63 U/L (ref 38–126)
Anion gap: 8 (ref 5–15)
BUN: 23 mg/dL — ABNORMAL HIGH (ref 6–20)
CO2: 27 mmol/L (ref 22–32)
Calcium: 8.9 mg/dL (ref 8.9–10.3)
Chloride: 104 mmol/L (ref 98–111)
Creatinine, Ser: 1.18 mg/dL — ABNORMAL HIGH (ref 0.44–1.00)
GFR calc Af Amer: >60 mL/min (ref >60)
GFR calc non Af Amer: 54 mL/min — ABNORMAL LOW (ref >60)
Glucose, Bld: 122 mg/dL — ABNORMAL HIGH (ref 70–99)
Potassium: 3.7 mmol/L (ref 3.5–5.1)
Sodium: 139 mmol/L (ref 135–145)
Total Bilirubin: 0.6 mg/dL (ref 0.3–1.2)
Total Protein: 7.4 g/dL (ref 6.5–8.1)

## 2019-06-05 LAB — URINALYSIS, ROUTINE W REFLEX MICROSCOPIC
Bilirubin Urine: NEGATIVE
Glucose, UA: NEGATIVE mg/dL
Hgb urine dipstick: NEGATIVE
Ketones, ur: NEGATIVE mg/dL
Leukocytes,Ua: NEGATIVE
Nitrite: NEGATIVE
Protein, ur: 100 mg/dL — AB
Specific Gravity, Urine: 1.015 (ref 1.005–1.030)
pH: 6 (ref 5.0–8.0)

## 2019-06-05 LAB — TROPONIN I (HIGH SENSITIVITY)
Troponin I (High Sensitivity): 21 ng/L — ABNORMAL HIGH (ref <18)
Troponin I (High Sensitivity): 23 ng/L — ABNORMAL HIGH (ref <18)

## 2019-06-05 LAB — URINALYSIS, MICROSCOPIC (REFLEX)
Bacteria, UA: NONE SEEN
RBC / HPF: NONE SEEN RBC/hpf (ref 0–5)
WBC, UA: NONE SEEN WBC/hpf (ref 0–5)

## 2019-06-05 MED ORDER — SUCRALFATE 1 G PO TABS: 1.0000 g | ORAL_TABLET | Freq: Three times a day (TID) | ORAL | 0 refills | Status: DC

## 2019-06-05 MED ORDER — ALUM & MAG HYDROXIDE-SIMETH 200-200-20 MG/5ML PO SUSP
30.0000 mL | Freq: Once | ORAL | Status: AC
  Administered 2019-06-05: 30 mL via ORAL
  Filled 2019-06-05: qty 30

## 2019-06-05 MED ORDER — LIDOCAINE VISCOUS HCL 2 % MT SOLN
15.0000 mL | Freq: Once | OROMUCOSAL | Status: AC
  Administered 2019-06-05: 15 mL via ORAL
  Filled 2019-06-05: qty 15

## 2019-06-05 MED ORDER — METHOCARBAMOL 500 MG PO TABS: 500.0000 mg | ORAL_TABLET | Freq: Two times a day (BID) | ORAL | 0 refills | Status: DC

## 2019-06-05 NOTE — ED Triage Notes (Signed)
Pt c/o bil leg cramping x 2 days

## 2019-06-05 NOTE — Discharge Instructions (Signed)
Your work-up today was reassuring.  I provided you with Carafate.  Please take that as directed.  Take Robaxin as prescribed. This medication will make you drowsy so do not drive or drink alcohol when taking it.  Follow up with your cardiologist as directed.  Return to the Emergency Department immediately if you experiencing worsening chest pain, difficulty breathing, nausea/vomiting, get very sweaty, headache or any other worsening or concerning symptoms.

## 2019-06-05 NOTE — ED Notes (Addendum)
Pt states she has been doubling up on her lasix x 1 month d/t increased leg swelling

## 2019-06-05 NOTE — ED Provider Notes (Signed)
[facility] Provider Note   CSN: [id number] Arrival date & time: 06/05/19  1514     History Chief Complaint  Patient presents with  . leg cramping    Linda Phelps is a 49 y.o. female past medical history of CHF, diabetes, high cholesterol, hypertension who presents for evaluation of bilateral leg cramps, back pain and chest pain.  She reports yesterday, she started having some cramping in her bilateral legs.  She denies any overlying warmth, erythema, edema.  She denies any preceding trauma, injury.  She states that since then, the cramping in her legs has resolved.  Today while she was at work, she started having a "twinging type" pain in her lower back.  She states then she started experiencing "a weird sensation" across her chest and into her throat.  She does report she has some burning in the middle aspect of her chest.  No associated diaphoresis, nausea/vomiting.  No difficulty breathing.  She took Tylenol with no improvement in her back pain.  She states she has not any recent fevers, cough, congestion.  She denies any abdominal pain, numbness/weakness of arms or legs.  No recent travel.  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 She denies any OCP use, recent immobilization, prior history of DVT/PE, recent surgery, leg swelling, or long travel.  The history is provided by the patient.    HPI: A 49 year old patient with a history of treated diabetes and hypertension presents for evaluation of chest pain. Initial onset of pain was less than one hour ago. The patient's chest pain is described as heaviness/pressure/tightness and is not worse with exertion. The patient's chest pain is not middle- or left-sided, is not well-localized, is not sharp and does not radiate to the arms/jaw/neck. The patient does not complain of nausea and denies diaphoresis. The patient has no history of stroke, has no history of peripheral artery disease, has  not smoked in the past 90 days, has no relevant family history of coronary artery disease (first degree relative at less than age 63), has no history of hypercholesterolemia and does not have an elevated BMI (>=30).   Past Medical History:  Diagnosis Date  . 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 CHF (congestive heart failure) ([place])   . Diabetes mellitus without complication ([place])   . Ectopic pregnancy   . High cholesterol   . Hypertension   . Low back pain   . Obesity   . Thyroid disease     Patient Active Problem List   Diagnosis Date Noted  . Chronic right-sided low back pain without sciatica 07/11/2016  . Vitamin D insufficiency 01/13/2016  . Menstrual cycle problem 01/11/2016  . Hordeolum externum (stye) 01/11/2016  . Obesity (BMI 30.0-34.9) 10/01/2015  . Chronic systolic heart failure ([place]) 09/21/2015  . History of noncompliance with medical treatment 09/21/2015  . Long-term use of aspirin therapy 09/21/2015  . Palpitations 07/26/2015  . GERD (gastroesophageal reflux disease) 07/26/2015  . Generalized anxiety disorder 07/26/2015  . Poor dentition 04/13/2015  . Cardiomegaly 09/23/2014  . Diabetes mellitus type 2 in obese ([place]) 07/03/2014  . Essential hypertension 07/03/2014  . Hyperlipidemia 07/03/2014  . Hypothyroidism 07/03/2014    Past Surgical History:  Procedure Laterality Date  . APPENDECTOMY    . CHOLECYSTECTOMY       OB History   No obstetric history on file.     Family History  Problem Relation Age of Onset  . Diabetes Mother   .  Diabetes Maternal Grandmother   . Colon cancer Maternal Uncle 61    Social History   Tobacco Use  . Smoking status: Never Smoker  . Smokeless tobacco: Never Used  Substance Use Topics  . Alcohol use: Yes    Alcohol/week: 0.0 standard drinks    Comment: occ  . Drug use: No    Home Medications Prior to Admission medications   Medication Sig Start Date End Date Taking? Authorizing Provider    acetaminophen (TYLENOL) 500 MG tablet Take 2 tablets (1,000 mg total) by mouth every 6 (six) hours as needed for mild pain or moderate pain. 01/08/17   Alfonse Spruce, FNP  amLODipine (NORVASC) 10 MG tablet Take 1 tablet (10 mg total) by mouth daily. 05/26/19   Elsie Stain, MD  aspirin 81 MG tablet Take 1 tablet (81 mg total) by mouth daily. 10/17/17   Argentina Donovan, PA-C  Blood Glucose Monitoring Suppl (TRUE METRIX METER) w/Device KIT Use as instructed. Please monitor blood glucose levels fasting and 1 hour after lunch and dinner. 07/03/18   Gildardo Pounds, NP  carvedilol (COREG) 12.5 MG tablet Take 1 tablet (12.5 mg total) by mouth 2 (two) times daily with a meal. 05/26/19   Elsie Stain, MD  cephALEXin (KEFLEX) 500 MG capsule Take 1 capsule (500 mg total) by mouth 3 (three) times daily. 05/29/19   Argentina Donovan, PA-C  diclofenac sodium (VOLTAREN) 1 % GEL Apply 2 g topically 4 (four) times daily. 11/14/18   Long, Wonda Olds, MD  fluticasone (FLONASE) 50 MCG/ACT nasal spray Place 2 sprays into both nostrils daily. 05/26/19   Elsie Stain, MD  furosemide (LASIX) 20 MG tablet Take 1 tablet (20 mg total) by mouth 2 (two) times daily. 05/26/19   Elsie Stain, MD  glucose blood (TRUE METRIX BLOOD GLUCOSE TEST) test strip Use as instructed. Please monitor blood glucose levels fasting and 1 hour after lunch and dinner. 07/03/18   Gildardo Pounds, NP  levothyroxine (SYNTHROID) 50 MCG tablet Take 1 tablet (50 mcg total) by mouth daily. 05/26/19   Elsie Stain, MD  lisinopril (ZESTRIL) 40 MG tablet Take 1 tablet (40 mg total) by mouth daily. 05/26/19   Elsie Stain, MD  metFORMIN (GLUCOPHAGE) 1000 MG tablet Take 1 tablet (1,000 mg total) by mouth 2 (two) times daily with a meal. 05/26/19   Elsie Stain, MD  methocarbamol (ROBAXIN) 500 MG tablet Take 1 tablet (500 mg total) by mouth 2 (two) times daily. 06/05/19   Volanda Napoleon, PA-C  pantoprazole (PROTONIX) 40 MG tablet Take 1  tablet (40 mg total) by mouth daily. 05/26/19   Elsie Stain, MD  sucralfate (CARAFATE) 1 g tablet Take 1 tablet (1 g total) by mouth 4 (four) times daily -  with meals and at bedtime. 06/05/19   Volanda Napoleon, PA-C  triamcinolone cream (KENALOG) 0.1 % Apply 1 application topically 2 (two) times daily. 10/17/17   Argentina Donovan, PA-C  TRUEPLUS LANCETS 28G MISC Use as instructed. Please monitor blood glucose levels fasting and 1 hour after lunch and dinner. 07/03/18   Gildardo Pounds, NP    Allergies    Ibuprofen  Review of Systems   Review of Systems  Constitutional: Negative for fever.  Respiratory: Negative for cough and shortness of breath.   Cardiovascular: Positive for chest pain.  Gastrointestinal: Negative for abdominal pain, nausea and vomiting.  Genitourinary: Negative for dysuria and hematuria.  Musculoskeletal:  Positive for back pain and myalgias.  Neurological: Negative for weakness, numbness and headaches.  All other systems reviewed and are negative.   Physical Exam Updated Vital Signs BP (!) 151/109   Pulse 79   Temp 98.6 F (37 C)   Resp 15   Ht 5' 1"  (1.549 m)   Wt 81.6 kg   LMP 10/08/2013   SpO2 99%   BMI 34.01 kg/m   Physical Exam Vitals and nursing note reviewed.  Constitutional:      Appearance: Normal appearance. She is well-developed.  HENT:     Head: Normocephalic and atraumatic.  Eyes:     General: Lids are normal.     Conjunctiva/sclera: Conjunctivae normal.     Pupils: Pupils are equal, round, and reactive to light.  Cardiovascular:     Rate and Rhythm: Normal rate and regular rhythm.     Pulses: Normal pulses.     Heart sounds: Normal heart sounds. No murmur. No friction rub. No gallop.   Pulmonary:     Effort: Pulmonary effort is normal.     Breath sounds: Normal breath sounds.     Comments: Lungs clear to auscultation bilaterally.  Symmetric chest rise.  No wheezing, rales, rhonchi. Abdominal:     Palpations: Abdomen is soft.  Abdomen is not rigid.     Tenderness: There is no abdominal tenderness. There is no guarding.     Comments: Abdomen is soft, non-distended, non-tender. No rigidity, No guarding. No peritoneal signs.  Musculoskeletal:        General: Normal range of motion.     Cervical back: Full passive range of motion without pain.       Back:     Comments: Diffuse tenderness noted to lower back.  No deformity or crepitus noted.  No overlying rash. Bilateral lower extremities are symmetric in appearance without any overlying warmth, erythema, edema.  No tenderness palpation of the bilateral lower extremities.  Skin:    General: Skin is warm and dry.     Capillary Refill: Capillary refill takes less than 2 seconds.  Neurological:     Mental Status: She is alert and oriented to person, place, and time.     Comments: Follows commands, Moves all extremities  5/5 strength to BUE and BLE  Sensation intact throughout all major nerve distributions  Psychiatric:        Speech: Speech normal.     ED Results / Procedures / Treatments   Labs (all labs ordered are listed, but only abnormal results are displayed) Labs Reviewed  COMPREHENSIVE METABOLIC PANEL - Abnormal; Notable for the following components:      Result Value   Glucose, Bld 122 (*)    BUN 23 (*)    Creatinine, Ser 1.18 (*)    GFR calc non Af Amer 54 (*)    All other components within normal limits  URINALYSIS, ROUTINE W REFLEX MICROSCOPIC - Abnormal; Notable for the following components:   Protein, ur 100 (*)    All other components within normal limits  TROPONIN I (HIGH SENSITIVITY) - Abnormal; Notable for the following components:   Troponin I (High Sensitivity) 21 (*)    All other components within normal limits  TROPONIN I (HIGH SENSITIVITY) - Abnormal; Notable for the following components:   Troponin I (High Sensitivity) 23 (*)    All other components within normal limits  CBC WITH DIFFERENTIAL/PLATELET  URINALYSIS, MICROSCOPIC  (REFLEX)    EKG EKG Interpretation  Date/Time:  Thursday June 05 2019 15:59:58 EST Ventricular Rate:  74 PR Interval:    QRS Duration: 97 QT Interval:  375 QTC Calculation: 416 R Axis:   79 Text Interpretation: Sinus rhythm Probable left atrial enlargement Left ventricular hypertrophy Nonspecific T abnormalities, inferior leads Borderline ST elevation, lateral leads Similar to Sep 2020 tracing. No STEMI. Confirmed by Nanda Quinton [phone]) on 06/05/2019 4:03:22 PM   Radiology DG Chest Portable 1 View  Result Date: 06/05/2019 CLINICAL DATA:  Pt states that she started having some leg cramps yesterday in both legs and that she was having some chest discomfort. Pt states that she has hypertension which is controlled by medication but was elevated upon arrival to the ER. Pt also has hx of acid reflux. EXAM: PORTABLE CHEST 1 VIEW COMPARISON:  09/15/2018 FINDINGS: Normal heart, mediastinum and hila. Clear lungs.  No pleural effusion or pneumothorax. Skeletal structures are grossly intact. IMPRESSION: No active disease. Electronically Signed   By: Lajean Manes M.D.   On: 06/05/2019 16:40    Procedures Procedures (including critical care time)  Medications Ordered in ED Medications  alum & mag hydroxide-simeth (MAALOX/MYLANTA) 200-200-20 MG/5ML suspension 30 mL (30 mLs Oral Given 06/05/19 1607)    And  lidocaine (XYLOCAINE) 2 % viscous mouth solution 15 mL (15 mLs Oral Given 06/05/19 1607)    ED Course  I have reviewed the triage vital signs and the nursing notes.  Pertinent labs & imaging results that were available during my care of the patient were reviewed by me and considered in my medical decision making (see chart for details).    MDM Rules/Calculators/A&P HEAR Score: 39                    49 year old female with past medical story of high blood pressure, diabetes who presents for evaluation of bilateral leg pain that began yesterday as well as lower back pain anterior chest  pain that began today.  Describes pain in the anterior aspect of her chest that went to her neck.  She states that she also has some burning in the middle of her chest.  No associated shortness of breath, nausea/vomiting, diaphoresis. Patient is afebrile, non-toxic appearing, sitting comfortably on examination table. Vital signs reviewed and stable.  No neuro deficits noted on exam.  Abdomen is benign.  Doubt ACS etiology given atypical presentation but given her history/risk factors, it is a consideration.  Consider electrolyte imbalance.  History/physical exam is not concerning for dissection, cauda equina, spinal abscess, PE, DVT, rhabdomyolysis.  Plan to check labs, imaging.  CMP shows BUN of 23, creatinine of 1.18.  Potassium is unremarkable.  CBC shows no leukocytosis or anemia.  UA shows no infectious etiology.  Initial Trope was 21.  I reviewed her records and shows that she does have history of elevated tropes in the past.  They were previously 0.03.  We will plan a delta troponin.  Patient patient's history/risk factors, she has a heart score of 3.  Given her troponin, will plan for repeat troponin.  Her delta troponin is 23.  At this time, given her atypical symptoms, do not feel that this is representative of ACS etiology.  Her troponins today are comparable to her previous troponins which were not high-sensitivity.  Additionally, she complains of some burning sensation that I SPECT is most likely GERD related.  Reevaluation.  Patient reports improvement in the sensation in her chest after GI cocktail.  She is hemodynamically stable.  Patient does have follow-up appointment  with her cardiologist in a week.  I encouraged her to keep that appointment. At this time, patient exhibits no emergent life-threatening condition that require further evaluation in ED or admission. Discussed patient with Dr. Laverta Baltimore who is agreeable to plan. Patient had ample opportunity for questions and discussion. All  patient's questions were answered with full understanding. Strict return precautions discussed. Patient expresses understanding and agreement to plan.   Portions of this note were generated with [employer]. Dictation errors may occur despite best attempts at proofreading.  Final Clinical Impression(s) / ED Diagnoses Final diagnoses:  Atypical chest pain  Musculoskeletal pain    Rx / DC Orders ED Discharge Orders         Ordered    methocarbamol (ROBAXIN) 500 MG tablet  2 times daily     06/05/19 1929    sucralfate (CARAFATE) 1 g tablet  3 times daily with meals & bedtime     06/05/19 1929           Desma Mcgregor 06/05/19 2301    Margette Fast, MD 06/08/19 1950

## 2019-06-06 ENCOUNTER — Telehealth: Payer: Self-pay | Admitting: Cardiology

## 2019-06-06 NOTE — Telephone Encounter (Signed)
Attempted to call patient, no answer and voicemail box not set up with continue efforts.

## 2019-06-06 NOTE — Telephone Encounter (Signed)
Patient is calling stating she was seen in the ED last night 06/05/19 and she is wanting a sooner appointment then the one scheduled for 06/16/19. Please advise.

## 2019-06-11 NOTE — Telephone Encounter (Signed)
Tried calling patient and unable to reach her. There are no sooner appointments before 06/15/18.

## 2019-06-16 ENCOUNTER — Ambulatory Visit (INDEPENDENT_AMBULATORY_CARE_PROVIDER_SITE_OTHER): Payer: Self-pay | Admitting: Cardiology

## 2019-06-16 ENCOUNTER — Other Ambulatory Visit: Payer: Self-pay

## 2019-06-16 ENCOUNTER — Encounter: Payer: Self-pay | Admitting: *Deleted

## 2019-06-16 ENCOUNTER — Other Ambulatory Visit: Payer: Self-pay | Admitting: Cardiology

## 2019-06-16 ENCOUNTER — Encounter: Payer: Self-pay | Admitting: Cardiology

## 2019-06-16 VITALS — BP 150/100 | HR 89 | Ht 61.0 in | Wt 197.0 lb

## 2019-06-16 DIAGNOSIS — I428 Other cardiomyopathies: Secondary | ICD-10-CM

## 2019-06-16 DIAGNOSIS — E669 Obesity, unspecified: Secondary | ICD-10-CM

## 2019-06-16 DIAGNOSIS — I10 Essential (primary) hypertension: Secondary | ICD-10-CM

## 2019-06-16 DIAGNOSIS — R002 Palpitations: Secondary | ICD-10-CM

## 2019-06-16 DIAGNOSIS — R079 Chest pain, unspecified: Secondary | ICD-10-CM

## 2019-06-16 DIAGNOSIS — E1169 Type 2 diabetes mellitus with other specified complication: Secondary | ICD-10-CM

## 2019-06-16 DIAGNOSIS — R0789 Other chest pain: Secondary | ICD-10-CM

## 2019-06-16 NOTE — Patient Instructions (Addendum)
Medication Instructions:  Your physician recommends that you continue on your current medications as directed. Please refer to the Current Medication list given to you today.  *If you need a refill on your cardiac medications before your next appointment, please call your pharmacy*  Lab Work: TODAY:  LIPID / TSH  If you have labs (blood work) drawn today and your tests are completely normal, you will receive your results only by: Marland Kitchen MyChart Message (if you have MyChart) OR . A paper copy in the mail If you have any lab test that is abnormal or we need to change your treatment, we will call you to review the results.  Testing/Procedures: Your physician has requested that you have an echocardiogram. Echocardiography is a painless test that uses sound waves to create images of your heart. It provides your doctor with information about the size and shape of your heart and how well your heart's chambers and valves are working. This procedure takes approximately one hour. There are no restrictions for this procedure.  Your physician recommends you have a Cardiac Calcium Scoring CT.  This can be done at our [facility].    Your physician recommends you wear a Heart Monitor - Zio Patch for 7 days.  You will need to come back to the office to have this put on after you have done your Echocardiogram.   ZIO XT- Long Term Monitor Instructions   Your physician has requested you wear your ZIO patch monitor 7 days.   This is a single patch monitor.  [employer] supplies one patch monitor per enrollment.  Additional stickers are not available.   Please do not apply patch if you will be having a Nuclear Stress Test, Echocardiogram, Cardiac CT, MRI, or Chest Xray during the time frame you would be wearing the monitor. The patch cannot be worn during these tests.  You cannot remove and re-apply the ZIO XT patch monitor.   Applying the monitor       Do not shower for the first 24 hours.  You may  shower after the first 24 hours.   Press button if you feel a symptom. You will hear a small click.  Record Date, Time and Symptom in the Patient Log Book.   When you are ready to remove patch, follow instructions on last 2 pages of Patient Log Book.  Stick patch monitor onto last page of Patient Log Book.   Place Patient Log Book in [place] box.  Use locking tab on box and tape box closed securely.  The [employer] and [employer] has [employer] on it.  Please place in mailbox as soon as possible.  Your physician should have your test results approximately 7 days after the monitor has been mailed back to [facility].   Call Josephine at [phone] if you have questions regarding your ZIO XT patch monitor.  Call them immediately if you see an orange light blinking on your monitor.   If your monitor falls off in less than 4 days contact our Monitor department at [phone].  If your monitor becomes loose or falls off after 4 days call [employer] at [phone] for suggestions on securing your monitor.     Follow-Up: At [facility], you and your health needs are our priority.  As part of our continuing mission to provide you with exceptional heart care, we have created designated Provider Care Teams.  These Care Teams include your primary Cardiologist (physician) and Advanced Practice Providers (APPs -  Physician Assistants and Nurse Practitioners) who all work together to provide you with the care you need, when you need it.  Your next appointment:   1 month(s)  The format for your next appointment:   In Person  Provider:   You may see Jenne Campus, MD  or the following Advanced Practice Provider on your designated Care Team:    Laurann Montana, FNP   Other Instructions  Echocardiogram An echocardiogram is a procedure that uses painless sound waves (ultrasound) to produce an image of the heart. Images from an echocardiogram can provide important information  about:  Signs of coronary artery disease (CAD).  Aneurysm detection. An aneurysm is a weak or damaged part of an artery wall that bulges out from the normal force of blood pumping through the body.  Heart size and shape. Changes in the size or shape of the heart can be associated with certain conditions, including heart failure, aneurysm, and CAD.  Heart muscle function.  Heart valve function.  Signs of a past heart attack.  Fluid buildup around the heart.  Thickening of the heart muscle.  A tumor or infectious growth around the heart valves. Tell a health care provider about:  Any allergies you have.  All medicines you are taking, including vitamins, herbs, eye drops, creams, and over-the-counter medicines.  Any blood disorders you have.  Any surgeries you have had.  Any medical conditions you have.  Whether you are pregnant or may be pregnant. What are the risks? Generally, this is a safe procedure. However, problems may occur, including:  Allergic reaction to dye (contrast) that may be used during the procedure. What happens before the procedure? No specific preparation is needed. You may eat and drink normally. What happens during the procedure?   An IV tube may be inserted into one of your veins.  You may receive contrast through this tube. A contrast is an injection that improves the quality of the pictures from your heart.  A gel will be applied to your chest.  A wand-like tool (transducer) will be moved over your chest. The gel will help to transmit the sound waves from the transducer.  The sound waves will harmlessly bounce off of your heart to allow the heart images to be captured in real-time motion. The images will be recorded on a computer. The procedure may vary among health care providers and hospitals. What happens after the procedure?  You may return to your normal, everyday life, including diet, activities, and medicines, unless your health care  provider tells you not to do that. Summary  An echocardiogram is a procedure that uses painless sound waves (ultrasound) to produce an image of the heart.  Images from an echocardiogram can provide important information about the size and shape of your heart, heart muscle function, heart valve function, and fluid buildup around your heart.  You do not need to do anything to prepare before this procedure. You may eat and drink normally.  After the echocardiogram is completed, you may return to your normal, everyday life, unless your health care provider tells you not to do that. This information is not intended to replace advice given to you by your health care provider. Make sure you discuss any questions you have with your health care provider. Document Revised: 08/29/2018 Document Reviewed: 06/10/2016 Elsevier Patient Education  [place].

## 2019-06-16 NOTE — Progress Notes (Signed)
Cardiology Office Note:    Date:  06/16/2019   ID:  Linda Phelps, DOB 1971/02/03, MRN [id number]  PCP:  Ladell Pier, MD  Cardiologist:  Jenne Campus, MD    Referring MD: Elsie Stain, MD   Chief Complaint  Patient presents with  . Follow-up    palpatations    History of Present Illness:    Linda Phelps is a 50 y.o. female I had a pleasure to evaluate her more than a year ago.  She came to [place] because of palpitations.  She was given monitor however she never returned it.  This time she comes because she was in the emergency room recently with similar complaints she was feeling her heart fluttering.  That happen from time to time last for few minutes there was no associated symptoms with this but she also described to have some uneasy sensation in the chest.  Interestingly she always have mildly elevated troponin I.  At this is exactly what happened last time when she was in the emergency room.  Since she was discharged from the emergency room she was doing well.  Denies have any chest pain, tightness, pressure, burning in the chest.  Blood palpitations still happening from time to time.  She try to lose some weight exercise on the regular basis but she said that sometimes she is locking of that.  Past Medical History:  Diagnosis Date  . 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 CHF (congestive heart failure) ([place])   . Diabetes mellitus without complication ([place])   . Ectopic pregnancy   . High cholesterol   . Hypertension   . Low back pain   . Obesity   . Thyroid disease     Past Surgical History:  Procedure Laterality Date  . APPENDECTOMY    . CHOLECYSTECTOMY      Current Medications: Current Meds  Medication Sig  . acetaminophen (TYLENOL) 500 MG tablet Take 2 tablets (1,000 mg total) by mouth every 6 (six) hours as needed for mild pain or moderate pain.  Marland Kitchen amLODipine (NORVASC) 10 MG tablet Take 1 tablet (10 mg total) by mouth daily.  Marland Kitchen aspirin 81 MG  tablet Take 1 tablet (81 mg total) by mouth daily.  . Blood Glucose Monitoring Suppl (TRUE METRIX METER) w/Device KIT Use as instructed. Please monitor blood glucose levels fasting and 1 hour after lunch and dinner.  . carvedilol (COREG) 12.5 MG tablet Take 1 tablet (12.5 mg total) by mouth 2 (two) times daily with a meal.  . diclofenac sodium (VOLTAREN) 1 % GEL Apply 2 g topically 4 (four) times daily.  . fluticasone (FLONASE) 50 MCG/ACT nasal spray Place 2 sprays into both nostrils daily.  . furosemide (LASIX) 20 MG tablet Take 1 tablet (20 mg total) by mouth 2 (two) times daily.  Marland Kitchen glucose blood (TRUE METRIX BLOOD GLUCOSE TEST) test strip Use as instructed. Please monitor blood glucose levels fasting and 1 hour after lunch and dinner.  . levothyroxine (SYNTHROID) 50 MCG tablet Take 1 tablet (50 mcg total) by mouth daily.  Marland Kitchen lisinopril (ZESTRIL) 40 MG tablet Take 1 tablet (40 mg total) by mouth daily.  . metFORMIN (GLUCOPHAGE) 1000 MG tablet Take 1 tablet (1,000 mg total) by mouth 2 (two) times daily with a meal.  . pantoprazole (PROTONIX) 40 MG tablet Take 1 tablet (40 mg total) by mouth daily.  . TRUEPLUS LANCETS 28G MISC Use as instructed. Please monitor blood glucose levels fasting and 1 hour after lunch and  dinner.     Allergies:   Ibuprofen   Social History   Socioeconomic History  . Marital status: Single    Spouse name: Not on file  . Number of children: Not on file  . Years of education: Not on file  . Highest education level: Not on file  Occupational History  . Not on file  Tobacco Use  . Smoking status: Never Smoker  . Smokeless tobacco: Never Used  Substance and Sexual Activity  . Alcohol use: Yes    Alcohol/week: 0.0 standard drinks    Comment: occ  . Drug use: No  . Sexual activity: Not Currently    Birth control/protection: None  Other Topics Concern  . Not on file  Social History Narrative   She has a 49 you daughter   61 yo son in jail until 01/2017    Dating   Works as housekeeper   Investment banker, operational of Radio broadcast assistant Strain:   . Difficulty of Paying Living Expenses: Not on file  Food Insecurity:   . Worried About Charity fundraiser in the Last Year: Not on file  . Ran Out of Food in the Last Year: Not on file  Transportation Needs:   . Lack of Transportation (Medical): Not on file  . Lack of Transportation (Non-Medical): Not on file  Physical Activity:   . Days of Exercise per Week: Not on file  . Minutes of Exercise per Session: Not on file  Stress:   . Feeling of Stress : Not on file  Social Connections:   . Frequency of Communication with Friends and Family: Not on file  . Frequency of Social Gatherings with Friends and Family: Not on file  . Attends Religious Services: Not on file  . Active Member of Clubs or Organizations: Not on file  . Attends Archivist Meetings: Not on file  . Marital Status: Not on file     Family History: The patient's family history includes Colon cancer (age of onset: 68) in her maternal uncle; Diabetes in her maternal grandmother and mother. ROS:   Please see the history of present illness.    All 14 point review of systems negative except as described per history of present illness  EKGs/Labs/Other Studies Reviewed:      Recent Labs: 07/03/2018: TSH 1.410 06/05/2019: ALT 15; BUN 23; Creatinine, Ser 1.18; Hemoglobin 12.6; Platelets 321; Potassium 3.7; Sodium 139  Recent Lipid Panel    Component Value Date/Time   CHOL 180 01/08/2017 1521   TRIG 186 (H) 01/08/2017 1521   HDL 57 01/08/2017 1521   CHOLHDL 3.2 01/08/2017 1521   LDLCALC 86 01/08/2017 1521    Physical Exam:    VS:  BP (!) 150/100   Pulse 89   Ht 5' 1"  (1.549 m)   Wt 197 lb (89.4 kg)   LMP 10/08/2013   SpO2 99%   BMI 37.22 kg/m     Wt Readings from Last 3 Encounters:  06/16/19 197 lb (89.4 kg)  06/05/19 180 lb (81.6 kg)  02/11/19 190 lb (86.2 kg)     GEN:  Well nourished, well  developed in no acute distress HEENT: Normal NECK: No JVD; No carotid bruits LYMPHATICS: No lymphadenopathy CARDIAC: RRR, no murmurs, no rubs, no gallops RESPIRATORY:  Clear to auscultation without rales, wheezing or rhonchi  ABDOMEN: Soft, non-tender, non-distended MUSCULOSKELETAL:  No edema; No deformity  SKIN: Warm and dry LOWER EXTREMITIES: no swelling NEUROLOGIC:  Alert and oriented  x 3 PSYCHIATRIC:  Normal affect   ASSESSMENT:    1. Essential hypertension   2. Diabetes mellitus type 2 in obese (HCC)   3. Palpitations   4. Atypical chest pain    PLAN:    In order of problems listed above:  1. Essential hypertension blood pressure elevated today.  I will schedule her to have an echocardiogram to reassess left ventricle ejection fraction. 2. Palpitations: We will do monitor again.  She did not return to previous one.  I will check today TSH.  Also will check a fasting lipid profile to assess risk factors assessment.  She is diabetic she is not taking any cholesterol medication. 3. Atypical chest pain I will schedule her to have a stress test.  It will be done in form of Lexiscan.  Her symptoms are atypical but persistent elevation of troponin make me concerned.  On top of that she described to have palpitations some atypical chest pain. 4. Risk factors modifications if she is diabetic hypertensive with blood pressure still being elevated but this is the first visit after long time therefore I will not act on this.  We will monitor the situation and then treat accordingly.  I would like to see her back rather quickly within a month or so. 5. Also important piece of information is the fact that her brother who is younger than she is recently died because of congestive heart failure.  There is documentation in the chart that she get regarding cardiomyopathy however she declined she said she had high blood pressure but no cardiomyopathy. She recently refused to have a stress test.  We  will talk about potentially doing exercise Cardiolite or exercise treadmill stress test.  May eventually end up talking about potentially doing calcium score and she agreed for that test.  Therefore, calcium score will be done.  Medication Adjustments/Labs and Tests Ordered: Current medicines are reviewed at length with the patient today.  Concerns regarding medicines are outlined above.  No orders of the defined types were placed in this encounter.  Medication changes: No orders of the defined types were placed in this encounter.   Signed, Park Liter, MD, [facility] 06/16/2019 9:11 AM    [place]

## 2019-06-17 ENCOUNTER — Ambulatory Visit (HOSPITAL_BASED_OUTPATIENT_CLINIC_OR_DEPARTMENT_OTHER)
Admission: RE | Admit: 2019-06-17 | Discharge: 2019-06-17 | Disposition: A | Discharge status: Home / Self Care | Payer: Self-pay | Source: Ambulatory Visit | Attending: Cardiology | Admitting: Cardiology

## 2019-06-17 ENCOUNTER — Ambulatory Visit (INDEPENDENT_AMBULATORY_CARE_PROVIDER_SITE_OTHER): Payer: Self-pay

## 2019-06-17 DIAGNOSIS — R079 Chest pain, unspecified: Secondary | ICD-10-CM

## 2019-06-17 DIAGNOSIS — I428 Other cardiomyopathies: Secondary | ICD-10-CM | POA: Insufficient documentation

## 2019-06-17 LAB — LIPID PANEL
Chol/HDL Ratio: 3.5 ratio (ref 0.0–4.4)
Cholesterol, Total: 200 mg/dL — ABNORMAL HIGH (ref 100–199)
HDL: 57 mg/dL (ref >39)
LDL Chol Calc (NIH): 111 mg/dL — ABNORMAL HIGH (ref 0–99)
Triglycerides: 182 mg/dL — ABNORMAL HIGH (ref 0–149)
VLDL Cholesterol Cal: 32 mg/dL (ref 5–40)

## 2019-06-17 LAB — TSH: TSH: 1.79 u[IU]/mL (ref 0.450–4.500)

## 2019-06-17 NOTE — Progress Notes (Signed)
  Echocardiogram 2D Echocardiogram has been performed.  Linda Phelps 06/17/2019, 11:32 AM

## 2019-06-19 ENCOUNTER — Telehealth: Payer: Self-pay | Admitting: Emergency Medicine

## 2019-06-19 DIAGNOSIS — E782 Mixed hyperlipidemia: Secondary | ICD-10-CM

## 2019-06-19 MED ORDER — ATORVASTATIN CALCIUM 10 MG PO TABS: 10.0000 mg | ORAL_TABLET | Freq: Every day | ORAL | 1 refills | Status: DC

## 2019-06-19 NOTE — Telephone Encounter (Signed)
Called patient informed her of lab results per Dr. Agustin Cree and advised her to start lipitor per Dr. Agustin Cree 10 mg daily and have fasting labs rechecked in 6 weeks. Patient verbally understood. No further questions.

## 2019-06-23 ENCOUNTER — Telehealth: Payer: Self-pay | Admitting: Cardiology

## 2019-06-23 NOTE — Telephone Encounter (Signed)
Patient states that her heart monitor just arrived in the mail and she is having difficulties with the instructions. Please call and advise.

## 2019-06-23 NOTE — Telephone Encounter (Signed)
Pt instructed there should be 800 number with monitor to call for assistance ./cy

## 2019-06-26 ENCOUNTER — Other Ambulatory Visit (HOSPITAL_COMMUNITY): Payer: Self-pay

## 2019-06-26 ENCOUNTER — Encounter (HOSPITAL_COMMUNITY): Payer: Self-pay

## 2019-07-04 ENCOUNTER — Inpatient Hospital Stay: Admission: RE | Admit: 2019-07-04 | Payer: Self-pay | Source: Ambulatory Visit

## 2019-07-14 ENCOUNTER — Telehealth: Payer: Self-pay | Admitting: Cardiology

## 2019-07-14 ENCOUNTER — Inpatient Hospital Stay: Admission: RE | Admit: 2019-07-14 | Payer: Self-pay | Source: Ambulatory Visit

## 2019-07-14 NOTE — Telephone Encounter (Signed)
Patient had to cancel her CT due to lack of transportation. Would like to reschedule. Thank you!

## 2019-07-17 ENCOUNTER — Encounter: Payer: Self-pay | Admitting: Cardiology

## 2019-07-17 ENCOUNTER — Ambulatory Visit (INDEPENDENT_AMBULATORY_CARE_PROVIDER_SITE_OTHER): Payer: Self-pay | Admitting: Cardiology

## 2019-07-17 ENCOUNTER — Other Ambulatory Visit: Payer: Self-pay

## 2019-07-17 VITALS — BP 128/68 | HR 81 | Ht 61.0 in | Wt 195.1 lb

## 2019-07-17 DIAGNOSIS — I517 Cardiomegaly: Secondary | ICD-10-CM

## 2019-07-17 DIAGNOSIS — R002 Palpitations: Secondary | ICD-10-CM

## 2019-07-17 DIAGNOSIS — I10 Essential (primary) hypertension: Secondary | ICD-10-CM

## 2019-07-17 DIAGNOSIS — R0789 Other chest pain: Secondary | ICD-10-CM

## 2019-07-17 NOTE — Patient Instructions (Signed)

## 2019-07-17 NOTE — Progress Notes (Signed)
Cardiology Office Note:    Date:  07/17/2019   ID:  Linda Phelps, DOB 03-15-1971, MRN [id number]  PCP:  Ladell Pier, MD  Cardiologist:  Jenne Campus, MD    Referring MD: Ladell Pier, MD   No chief complaint on file. Doing well  History of Present Illness:    Linda Phelps is a 49 y.o. female with past medical history significant for palpitations, she was evaluated about 1 year ago by me that evaluation included monitoring however she never returned it.  Everything was fine until she come back again with similar complaint of palpitations.  Also interestingly she always have minimally elevated troponin I which is stable and is steady.  She comes today to follow-up.  She is doing well described the palpitation being much better.  She got the difficult work schedule she works typically until 2:00 in the morning and her sleep is typically interrupted during the day.  Today for the first time she brought her daughter to school first time after school being released from [place].  Denies having any chest pain, tightness, pressure, burning in the chest  Past Medical History:  Diagnosis Date  . 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 CHF (congestive heart failure) ([place])   . Diabetes mellitus without complication ([place])   . Ectopic pregnancy   . High cholesterol   . Hypertension   . Low back pain   . Obesity   . Thyroid disease     Past Surgical History:  Procedure Laterality Date  . APPENDECTOMY    . CHOLECYSTECTOMY      Current Medications: No outpatient medications have been marked as taking for the 07/17/19 encounter (Appointment) with Park Liter, MD.     Allergies:   Ibuprofen   Social History   Socioeconomic History  . Marital status: Single    Spouse name: Not on file  . Number of children: Not on file  . Years of education: Not on file  . Highest education level: Not on file  Occupational History  . Not on file  Tobacco Use  .  Smoking status: Never Smoker  . Smokeless tobacco: Never Used  Substance and Sexual Activity  . Alcohol use: Yes    Alcohol/week: 0.0 standard drinks    Comment: occ  . Drug use: No  . Sexual activity: Not Currently    Birth control/protection: None  Other Topics Concern  . Not on file  Social History Narrative   She has a 65 you daughter   53 yo son in jail until 01/2017   Dating   Works as housekeeper   Investment banker, operational of Radio broadcast assistant Strain:   . Difficulty of Paying Living Expenses: Not on file  Food Insecurity:   . Worried About Charity fundraiser in the Last Year: Not on file  . Ran Out of Food in the Last Year: Not on file  Transportation Needs:   . Lack of Transportation (Medical): Not on file  . Lack of Transportation (Non-Medical): Not on file  Physical Activity:   . Days of Exercise per Week: Not on file  . Minutes of Exercise per Session: Not on file  Stress:   . Feeling of Stress : Not on file  Social Connections:   . Frequency of Communication with Friends and Family: Not on file  . Frequency of Social Gatherings with Friends and Family: Not on file  . Attends Religious Services: Not on file  . Active  Member of Clubs or Organizations: Not on file  . Attends Archivist Meetings: Not on file  . Marital Status: Not on file     Family History: The patient's family history includes Colon cancer (age of onset: 51) in her maternal uncle; Diabetes in her maternal grandmother and mother. ROS:   Please see the history of present illness.    All 14 point review of systems negative except as described per history of present illness  EKGs/Labs/Other Studies Reviewed:    Echocardiogram from 17 June 2019 showed:  1. Left ventricular ejection fraction, by visual estimation, is 60 to  65%. There is no left ventricular hypertrophy.  2. Left ventricular diastolic parameters are consistent with Grade I  diastolic dysfunction (impaired  relaxation).  3. Global right ventricle has normal systolic function.The right  ventricular size is normal. No increase in right ventricular wall  thickness.  4. Left atrial size was normal.  5. Right atrial size was normal.  6. The mitral valve is normal in structure. No evidence of mitral valve  regurgitation. No evidence of mitral stenosis.  7. The tricuspid valve is normal in structure. Tricuspid valve  regurgitation is trivial.  8. The aortic valve is normal in structure. Aortic valve regurgitation is  not visualized. No evidence of aortic valve sclerosis or stenosis.  9. The pulmonic valve was normal in structure. Pulmonic valve  regurgitation is not visualized.  10. Normal pulmonary artery systolic pressure.  Recent Labs: 06/05/2019: ALT 15; BUN 23; Creatinine, Ser 1.18; Hemoglobin 12.6; Platelets 321; Potassium 3.7; Sodium 139 06/16/2019: TSH 1.790  Recent Lipid Panel    Component Value Date/Time   CHOL 200 (H) 06/16/2019 1102   TRIG 182 (H) 06/16/2019 1102   HDL 57 06/16/2019 1102   CHOLHDL 3.5 06/16/2019 1102   LDLCALC 111 (H) 06/16/2019 1102    Physical Exam:    VS:  LMP 10/08/2013     Wt Readings from Last 3 Encounters:  06/16/19 197 lb (89.4 kg)  06/05/19 180 lb (81.6 kg)  02/11/19 190 lb (86.2 kg)     GEN:  Well nourished, well developed in no acute distress HEENT: Normal NECK: No JVD; No carotid bruits LYMPHATICS: No lymphadenopathy CARDIAC: RRR, no murmurs, no rubs, no gallops RESPIRATORY:  Clear to auscultation without rales, wheezing or rhonchi  ABDOMEN: Soft, non-tender, non-distended MUSCULOSKELETAL:  No edema; No deformity  SKIN: Warm and dry LOWER EXTREMITIES: no swelling NEUROLOGIC:  Alert and oriented x 3 PSYCHIATRIC:  Normal affect   ASSESSMENT:    1. Palpitations   2. Atypical chest pain   3. Cardiomegaly   4. Essential hypertension    PLAN:    In order of problems listed above:  1. Palpitations since she returned to  monitor last week I still do not have results of that obviously will wait for it.  Echocardiogram showed preserved left ventricle ejection fraction.  Therefore I doubt very much that with any with some significant arrhythmia however again will wait for monitor. 2. Atypical chest pain denies having any.  She postpone her stress test she does have a schedule 9 of next month.  Will wait for results of it. 3. Cardiomegaly: This is not confirmed by echocardiogram.  Echocardiogram showed normal left ventricle ejection fraction.  Overall it was normal test.  Overall she seems to be doing well.  Awaiting stress test as well as a monitor.   Medication Adjustments/Labs and Tests Ordered: Current medicines are reviewed at length with the  patient today.  Concerns regarding medicines are outlined above.  No orders of the defined types were placed in this encounter.  Medication changes: No orders of the defined types were placed in this encounter.   Signed, Park Liter, MD, [facility] 07/17/2019 9:11 AM    [place]

## 2019-07-18 MED ORDER — AMLODIPINE BESYLATE 10 MG PO TABS: 10.0000 mg | ORAL_TABLET | Freq: Every day | ORAL | 1 refills | Status: DC

## 2019-07-18 MED ORDER — LISINOPRIL 40 MG PO TABS: 40.0000 mg | ORAL_TABLET | Freq: Every day | ORAL | 1 refills | Status: DC

## 2019-07-18 MED ORDER — FUROSEMIDE 20 MG PO TABS: 20.0000 mg | ORAL_TABLET | Freq: Two times a day (BID) | ORAL | 1 refills | Status: DC

## 2019-07-18 MED ORDER — CARVEDILOL 12.5 MG PO TABS: 12.5000 mg | ORAL_TABLET | Freq: Two times a day (BID) | ORAL | 1 refills | Status: DC

## 2019-07-18 NOTE — Addendum Note (Signed)
Addended by: Ashok Norris on: 07/18/2019 04:13 PM   Modules accepted: Orders

## 2019-07-20 ENCOUNTER — Other Ambulatory Visit: Payer: Self-pay | Admitting: Critical Care Medicine

## 2019-07-20 DIAGNOSIS — E039 Hypothyroidism, unspecified: Secondary | ICD-10-CM

## 2019-07-20 DIAGNOSIS — K219 Gastro-esophageal reflux disease without esophagitis: Secondary | ICD-10-CM

## 2019-07-20 DIAGNOSIS — I10 Essential (primary) hypertension: Secondary | ICD-10-CM

## 2019-07-29 ENCOUNTER — Inpatient Hospital Stay: Admission: RE | Admit: 2019-07-29 | Payer: Self-pay | Source: Ambulatory Visit

## 2019-08-01 ENCOUNTER — Encounter: Payer: Self-pay | Admitting: Internal Medicine

## 2019-08-01 ENCOUNTER — Ambulatory Visit (INDEPENDENT_AMBULATORY_CARE_PROVIDER_SITE_OTHER): Payer: Self-pay | Admitting: Internal Medicine

## 2019-08-01 DIAGNOSIS — H00015 Hordeolum externum left lower eyelid: Secondary | ICD-10-CM

## 2019-08-01 NOTE — Progress Notes (Signed)
Virtual Visit via Telephone Note  I connected with Linda Phelps, on 08/01/2019 at 11:09 AM by telephone due to the COVID-19 pandemic and verified that I am speaking with the correct person using two identifiers.   Consent: I discussed the limitations, risks, security and privacy concerns of performing an evaluation and management service by telephone and the availability of in person appointments. I also discussed with the patient that there may be a patient responsible charge related to this service. The patient expressed understanding and agreed to proceed.   Location of Patient: Home   Location of Provider: Clinic    Persons participating in Telemedicine visit: Linda Phelps [facility] Dr. Juleen China      History of Present Illness: Patient reports she believes she has a stye on her lower left eyelid. Has been present for a couple days. Started as an area that was sore and uncomfortable and is low a little, red bump. Reports she had a stye about 1.5 months ago. She was using the same mascara between that stye and this one. She is using warm compresses a couple times in the morning. No edema of the upper eyelid or face. Erythema does not extend beyond the lesion. Does not wear contacts. No fevers or chills. Vision is unchanged.    Past Medical History:  Diagnosis Date  . 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 CHF (congestive heart failure) ([place])   . Diabetes mellitus without complication ([place])   . Ectopic pregnancy   . High cholesterol   . Hypertension   . Low back pain   . Obesity   . Thyroid disease    Allergies  Allergen Reactions  . Ibuprofen Swelling    Current Outpatient Medications on File Prior to Visit  Medication Sig Dispense Refill  . amLODipine (NORVASC) 10 MG tablet Take 1 tablet (10 mg total) by mouth daily. 30 tablet 2  . aspirin 81 MG tablet Take 1 tablet (81 mg total) by mouth daily. 90 tablet 6  . atorvastatin (LIPITOR) 10 MG tablet Take 1  tablet (10 mg total) by mouth daily. 90 tablet 1  . Blood Glucose Monitoring Suppl (TRUE METRIX METER) w/Device KIT Use as instructed. Please monitor blood glucose levels fasting and 1 hour after lunch and dinner. 1 kit 0  . carvedilol (COREG) 12.5 MG tablet Take 1 tablet (12.5 mg total) by mouth 2 (two) times daily with a meal. 180 tablet 1  . fluticasone (FLONASE) 50 MCG/ACT nasal spray Place 2 sprays into both nostrils daily. 16 g 2  . furosemide (LASIX) 20 MG tablet Take 1 tablet (20 mg total) by mouth 2 (two) times daily. 180 tablet 1  . glucose blood (TRUE METRIX BLOOD GLUCOSE TEST) test strip Use as instructed. Please monitor blood glucose levels fasting and 1 hour after lunch and dinner. 100 each 12  . levothyroxine (SYNTHROID) 50 MCG tablet Take 1 tablet (50 mcg total) by mouth daily. 30 tablet 2  . lisinopril (ZESTRIL) 40 MG tablet Take 1 tablet (40 mg total) by mouth daily. 90 tablet 1  . metFORMIN (GLUCOPHAGE) 1000 MG tablet Take 1 tablet (1,000 mg total) by mouth 2 (two) times daily with a meal. 180 tablet 0  . pantoprazole (PROTONIX) 40 MG tablet Take 1 tablet (40 mg total) by mouth daily. 30 tablet 2  . TRUEPLUS LANCETS 28G MISC Use as instructed. Please monitor blood glucose levels fasting and 1 hour after lunch and dinner. 100 each 3   No current facility-administered medications  on file prior to visit.    Observations/Objective: NAD. Speaking clearly.  Work of breathing normal.  Alert and oriented. Mood appropriate.   Assessment and Plan: 1. Hordeolum externum of left lower eyelid Complaint sounds consistent with a stye. Likely related to use of same mascara when she had previous recent stye. Advised to throw it away and discontinue use of eye make up in general as this heals. No symptoms that would suggest something more serious or associated preseptal cellulitis. Advised to increase use of warm compresses and try to gently massage the area to promote drainage. If is not  improving by next week, would recommend evaluation by eye doctor.    Follow Up Instructions: PRN and with PCP for scheduled management of chronic conditions    I discussed the assessment and treatment plan with the patient. The patient was provided an opportunity to ask questions and all were answered. The patient agreed with the plan and demonstrated an understanding of the instructions.   The patient was advised to call back or seek an in-person evaluation if the symptoms worsen or if the condition fails to improve as anticipated.     I provided 12 minutes total of non-face-to-face time during this encounter including median intraservice time, reviewing previous notes, investigations, ordering medications, medical decision making, coordinating care and patient verbalized understanding at the end of the visit.    Phill Myron, D.O. Primary Care at [facility]  08/01/2019, 11:09 AM

## 2019-08-04 ENCOUNTER — Telehealth: Payer: Self-pay | Admitting: Emergency Medicine

## 2019-08-04 DIAGNOSIS — I10 Essential (primary) hypertension: Secondary | ICD-10-CM

## 2019-08-04 NOTE — Telephone Encounter (Signed)
Increase Carvedilol to 1.5 tabl po BID ( she is taking now 12.5, please confirm that).

## 2019-08-04 NOTE — Telephone Encounter (Signed)
Called patient with results from monitor. She is wanting to be treated for pvc's. Will inform Dr. Agustin Cree and follow up with patient with the recommended medication.

## 2019-08-05 MED ORDER — CARVEDILOL 12.5 MG PO TABS: 18.7500 mg | ORAL_TABLET | Freq: Two times a day (BID) | ORAL | 1 refills | Status: DC

## 2019-08-05 NOTE — Addendum Note (Signed)
Addended by: Ashok Norris on: 08/05/2019 11:34 AM   Modules accepted: Orders

## 2019-08-05 NOTE — Telephone Encounter (Signed)
Confirmed patient does take 12.5 mg twice daily or carvedilol, informed her per Dr. Agustin Cree in increase to 1.5 tablet twice daily. She verbally understood. No further questions.

## 2019-08-06 ENCOUNTER — Telehealth: Payer: Self-pay | Admitting: Internal Medicine

## 2019-08-06 NOTE — Telephone Encounter (Signed)
Patient states she has a stye in her eye and it is is getting worse.  Would like PCP to send cream to pharmacy. Please call patient.

## 2019-08-07 NOTE — Telephone Encounter (Signed)
Contacted pt to go over Dr. Wynetta Emery response pt is aware. Pt states she has an appointment schedule with the eye doctor.

## 2019-10-22 ENCOUNTER — Other Ambulatory Visit: Payer: Self-pay

## 2019-10-22 DIAGNOSIS — I10 Essential (primary) hypertension: Secondary | ICD-10-CM

## 2019-10-22 MED ORDER — AMLODIPINE BESYLATE 10 MG PO TABS: 10.0000 mg | ORAL_TABLET | Freq: Every day | ORAL | 0 refills | Status: DC

## 2019-10-28 ENCOUNTER — Other Ambulatory Visit: Payer: Self-pay

## 2019-10-28 ENCOUNTER — Encounter (HOSPITAL_BASED_OUTPATIENT_CLINIC_OR_DEPARTMENT_OTHER): Payer: Self-pay | Admitting: *Deleted

## 2019-10-28 ENCOUNTER — Emergency Department (HOSPITAL_BASED_OUTPATIENT_CLINIC_OR_DEPARTMENT_OTHER)
Admission: EM | Admit: 2019-10-28 | Discharge: 2019-10-28 | Disposition: A | Discharge status: Home / Self Care | Payer: Self-pay | Attending: Emergency Medicine | Admitting: Emergency Medicine

## 2019-10-28 ENCOUNTER — Emergency Department (HOSPITAL_BASED_OUTPATIENT_CLINIC_OR_DEPARTMENT_OTHER): Payer: Self-pay

## 2019-10-28 DIAGNOSIS — Z7982 Long term (current) use of aspirin: Secondary | ICD-10-CM | POA: Insufficient documentation

## 2019-10-28 DIAGNOSIS — I509 Heart failure, unspecified: Secondary | ICD-10-CM | POA: Insufficient documentation

## 2019-10-28 DIAGNOSIS — E119 Type 2 diabetes mellitus without complications: Secondary | ICD-10-CM | POA: Insufficient documentation

## 2019-10-28 DIAGNOSIS — I110 Hypertensive heart disease with heart failure: Secondary | ICD-10-CM | POA: Insufficient documentation

## 2019-10-28 DIAGNOSIS — M25561 Pain in right knee: Secondary | ICD-10-CM

## 2019-10-28 DIAGNOSIS — E039 Hypothyroidism, unspecified: Secondary | ICD-10-CM | POA: Insufficient documentation

## 2019-10-28 MED ORDER — METHOCARBAMOL 500 MG PO TABS: 500.0000 mg | ORAL_TABLET | Freq: Three times a day (TID) | ORAL | 0 refills | Status: DC | PRN

## 2019-10-28 NOTE — Discharge Instructions (Signed)
You were seen in the emergency room today with right knee pain.  We like you to keep this area compressed and apply a cool compress to help relieve pain.  You have a listed allergy to ibuprofen which is typically good for these types of injuries.  You may take Tylenol for pain and I have also called in a prescription for Robaxin.  This is more of a muscle relaxer type medicine and can cause drowsiness.  Do not take it with other strong pain medicines or drive a car while taking this medicine.  I have listed the name of your primary care doctor but also sports medicine doctor.  Please call the sports medicine doctor tomorrow morning to schedule the next available appointment for evaluation of your knee and to consider further therapies as needed.

## 2019-10-28 NOTE — ED Triage Notes (Signed)
C/o right injury x 4 days ago

## 2019-10-28 NOTE — ED Notes (Signed)
Pt discharged to home. Discharge instructions have been discussed with patient and/or family members. Pt verbally acknowledges understanding d/c instructions, and endorses comprehension to checkout at registration before leaving.  °

## 2019-10-28 NOTE — ED Notes (Signed)
Pharmacy and medications updated with patient 

## 2019-10-28 NOTE — ED Provider Notes (Signed)
Emergency Department Provider Note   I have reviewed the triage vital signs and the nursing notes.   HISTORY  Chief Complaint Knee Injury   HPI Linda Phelps is a 49 y.o. female with past medical history reviewed below presents to the emergency department with right knee pain after a fall 4 days ago.  Patient states that she tripped and fell landing directly on her right knee.  There was no head injury or loss of consciousness.  She states that she initially had some pain but then seemed to aggravate the area later in the day.  She has had discomfort mainly over the anterior knee with mild swelling.  She is tried topical pain medications along with over-the-counter medicines and a knee brace with no improvement in symptoms.  She denies pain in the hip or ankle.  No fevers or chills.  No redness to the knee.   Past Medical History:  Diagnosis Date  . 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 CHF (congestive heart failure) ([place])   . Diabetes mellitus without complication ([place])   . Ectopic pregnancy   . High cholesterol   . Hypertension   . Low back pain   . Obesity   . Thyroid disease     Patient Active Problem List   Diagnosis Date Noted  . Atypical chest pain 11/18/2017  . Chronic right-sided low back pain without sciatica 07/11/2016  . Vitamin D insufficiency 01/13/2016  . Menstrual cycle problem 01/11/2016  . Hordeolum externum (stye) 01/11/2016  . Obesity (BMI 30.0-34.9) 10/01/2015  . Chronic systolic heart failure ([place]) 09/21/2015  . History of noncompliance with medical treatment 09/21/2015  . Dwain Huhn-term use of aspirin therapy 09/21/2015  . Palpitations 07/26/2015  . GERD (gastroesophageal reflux disease) 07/26/2015  . Generalized anxiety disorder 07/26/2015  . Poor dentition 04/13/2015  . Cardiomegaly 09/23/2014  . Diabetes mellitus type 2 in obese ([place]) 07/03/2014  . Essential hypertension 07/03/2014  . Hyperlipidemia 07/03/2014  . Hypothyroidism  07/03/2014    Past Surgical History:  Procedure Laterality Date  . APPENDECTOMY    . CHOLECYSTECTOMY      Allergies Ibuprofen  Family History  Problem Relation Age of Onset  . Diabetes Mother   . Diabetes Maternal Grandmother   . Colon cancer Maternal Uncle 66    Social History Social History   Tobacco Use  . Smoking status: Never Smoker  . Smokeless tobacco: Never Used  Substance Use Topics  . Alcohol use: Yes    Alcohol/week: 0.0 standard drinks    Comment: occ  . Drug use: No    Review of Systems  Constitutional: No fever/chills Cardiovascular: Denies chest pain. Respiratory: Denies shortness of breath. Gastrointestinal: No abdominal pain.   Musculoskeletal: Negative for back pain. Positive right knee pain.  Skin: Negative for rash. Neurological: Negative for headaches, focal weakness or numbness.  10-point ROS otherwise negative.  ____________________________________________   PHYSICAL EXAM:  VITAL SIGNS: ED Triage Vitals  Enc Vitals Group     BP 10/28/19 1648 (!) 159/99     Pulse Rate 10/28/19 1648 95     Resp 10/28/19 1648 18     Temp 10/28/19 1648 99.6 F (37.6 C)     Temp src --      SpO2 10/28/19 1648 99 %     Weight 10/28/19 1645 170 lb (77.1 kg)     Height 10/28/19 1645 5\' 1"  (1.549 m)   Constitutional: Alert and oriented. Well appearing and in no acute distress. Eyes: Conjunctivae  are normal.  Head: Atraumatic. Nose: No congestion/rhinnorhea. Mouth/Throat: Mucous membranes are moist. Neck: No stridor.   Cardiovascular: Good peripheral circulation. Respiratory: Normal respiratory effort.   Musculoskeletal: No significant right knee swelling, erythema, warmth.  Tenderness over the patella but normal position in the knee.  No joint laxity.  No tenderness over the ankle.  Patient is ambulatory with a limp.  Neurologic:  Normal speech and language.  Skin:  Skin is warm, dry and intact. No rash  noted.  ____________________________________________  RADIOLOGY  DG Knee Complete 4 Views Right  Result Date: 10/28/2019 CLINICAL DATA:  Status post fall with a blow to the left knee 4 days ago. Initial encounter. EXAM: RIGHT KNEE - COMPLETE 4+ VIEW COMPARISON:  None. FINDINGS: No evidence of fracture, dislocation, or joint effusion. No evidence of arthropathy or other focal bone abnormality. Soft tissues are unremarkable. IMPRESSION: Negative exam. Electronically Signed   By: Inge Rise M.D.   On: 10/28/2019 17:38    ____________________________________________   PROCEDURES  Procedure(s) performed:   Procedures  None  ____________________________________________   INITIAL IMPRESSION / ASSESSMENT AND PLAN / ED COURSE  Pertinent labs & imaging results that were available during my care of the patient were reviewed by me and considered in my medical decision making (see chart for details).   Patient presents to the emergency department with right knee pain after fall.  No evidence to suspect septic joint.  Plain film of the knee ordered and pending.  Plan for supportive care at home, RICE, and sports medicine follow-up if symptoms worsen.   05:55 PM   X-ray of the knee reviewed with no acute findings.  Plan for Tylenol at home.  Prescribed Robaxin as needed for more severe discomfort.  Discussed the drowsy side effects.  Provided contact information for sports medicine and ED return precautions were discussed.  ____________________________________________  FINAL CLINICAL IMPRESSION(S) / ED DIAGNOSES  Final diagnoses:  Acute pain of right knee     NEW OUTPATIENT MEDICATIONS STARTED DURING THIS VISIT:  New Prescriptions   METHOCARBAMOL (ROBAXIN) 500 MG TABLET    Take 1 tablet (500 mg total) by mouth every 8 (eight) hours as needed.    Note:  This document was prepared using Dragon voice recognition software and may include unintentional dictation errors.  Nanda Quinton, MD, [facility] Emergency Medicine    Sahith Nurse, Wonda Olds, MD 10/28/19 [phone]

## 2019-11-01 ENCOUNTER — Other Ambulatory Visit: Payer: Self-pay

## 2019-11-01 ENCOUNTER — Ambulatory Visit (HOSPITAL_BASED_OUTPATIENT_CLINIC_OR_DEPARTMENT_OTHER)
Admission: RE | Admit: 2019-11-01 | Discharge: 2019-11-01 | Disposition: A | Discharge status: Home / Self Care | Payer: Self-pay | Source: Ambulatory Visit | Attending: Emergency Medicine | Admitting: Emergency Medicine

## 2019-11-01 ENCOUNTER — Emergency Department (HOSPITAL_BASED_OUTPATIENT_CLINIC_OR_DEPARTMENT_OTHER)
Admission: EM | Admit: 2019-11-01 | Discharge: 2019-11-01 | Disposition: A | Discharge status: Home / Self Care | Payer: Self-pay | Attending: Emergency Medicine | Admitting: Emergency Medicine

## 2019-11-01 ENCOUNTER — Encounter (HOSPITAL_BASED_OUTPATIENT_CLINIC_OR_DEPARTMENT_OTHER): Payer: Self-pay | Admitting: Emergency Medicine

## 2019-11-01 DIAGNOSIS — M79661 Pain in right lower leg: Secondary | ICD-10-CM | POA: Insufficient documentation

## 2019-11-01 DIAGNOSIS — Z7982 Long term (current) use of aspirin: Secondary | ICD-10-CM | POA: Insufficient documentation

## 2019-11-01 DIAGNOSIS — E119 Type 2 diabetes mellitus without complications: Secondary | ICD-10-CM | POA: Insufficient documentation

## 2019-11-01 DIAGNOSIS — Z7984 Long term (current) use of oral hypoglycemic drugs: Secondary | ICD-10-CM | POA: Insufficient documentation

## 2019-11-01 DIAGNOSIS — I110 Hypertensive heart disease with heart failure: Secondary | ICD-10-CM | POA: Insufficient documentation

## 2019-11-01 DIAGNOSIS — I5022 Chronic systolic (congestive) heart failure: Secondary | ICD-10-CM | POA: Insufficient documentation

## 2019-11-01 DIAGNOSIS — Z79899 Other long term (current) drug therapy: Secondary | ICD-10-CM | POA: Insufficient documentation

## 2019-11-01 LAB — CBC WITH DIFFERENTIAL/PLATELET
Abs Immature Granulocytes: 0.01 10*3/uL (ref 0.00–0.07)
Basophils Absolute: 0.1 10*3/uL (ref 0.0–0.1)
Basophils Relative: 1 %
Eosinophils Absolute: 0.3 10*3/uL (ref 0.0–0.5)
Eosinophils Relative: 4 %
HCT: 37.1 % (ref 36.0–46.0)
Hemoglobin: 11.9 g/dL — ABNORMAL LOW (ref 12.0–15.0)
Immature Granulocytes: 0 %
Lymphocytes Relative: 49 %
Lymphs Abs: 3.4 10*3/uL (ref 0.7–4.0)
MCH: 29.6 pg (ref 26.0–34.0)
MCHC: 32.1 g/dL (ref 30.0–36.0)
MCV: 92.3 fL (ref 80.0–100.0)
Monocytes Absolute: 0.8 10*3/uL (ref 0.1–1.0)
Monocytes Relative: 11 %
Neutro Abs: 2.4 10*3/uL (ref 1.7–7.7)
Neutrophils Relative %: 35 %
Platelets: 268 10*3/uL (ref 150–400)
RBC: 4.02 MIL/uL (ref 3.87–5.11)
RDW: 12.2 % (ref 11.5–15.5)
WBC: 7 10*3/uL (ref 4.0–10.5)
nRBC: 0 % (ref 0.0–0.2)

## 2019-11-01 LAB — BASIC METABOLIC PANEL
Anion gap: 9 (ref 5–15)
BUN: 23 mg/dL — ABNORMAL HIGH (ref 6–20)
CO2: 25 mmol/L (ref 22–32)
Calcium: 8.4 mg/dL — ABNORMAL LOW (ref 8.9–10.3)
Chloride: 105 mmol/L (ref 98–111)
Creatinine, Ser: 1.64 mg/dL — ABNORMAL HIGH (ref 0.44–1.00)
GFR calc Af Amer: 42 mL/min — ABNORMAL LOW (ref >60)
GFR calc non Af Amer: 36 mL/min — ABNORMAL LOW (ref >60)
Glucose, Bld: 160 mg/dL — ABNORMAL HIGH (ref 70–99)
Potassium: 3.6 mmol/L (ref 3.5–5.1)
Sodium: 139 mmol/L (ref 135–145)

## 2019-11-01 LAB — D-DIMER, QUANTITATIVE: D-Dimer, Quant: 0.66 ug/mL-FEU — ABNORMAL HIGH (ref 0.00–0.50)

## 2019-11-01 NOTE — ED Provider Notes (Addendum)
Pt returned for her [place].  The [place] was negative. Pt did not want to wait for results.  She asked the [place] tech for [place] to call her.  I called her and received no answer.  Voice mail has not been set up, so I was unable to leave a message.  Pt did call back my number and I was able to tell her that her [place] was negative.   Isla Pence, MD 11/01/19 [place], Janeya Deyo, MD 11/01/19 1359

## 2019-11-01 NOTE — ED Provider Notes (Signed)
[place] EMERGENCY DEPARTMENT Provider Note   CSN: [id number] Arrival date & time: 11/01/19  0410     History Chief Complaint  Patient presents with  . Leg Pain    Linda Phelps is a 49 y.o. female.  Patient is a 49 year old female with past medical history of diabetes, hypertension, CHF.  She presents today with complaints of pain in the right calf.  This began yesterday evening.  Patient fell 1 week ago, then was seen here days later.  She was diagnosed with a knee sprain.  This seemed to improve, however the pain is now in her calf.  She denies any chest pain or shortness of breath.  She denies any swelling.  Pain is worse with ambulation and relieved with rest.  The history is provided by the patient.       Past Medical History:  Diagnosis Date  . 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 CHF (congestive heart failure) ([place])   . Diabetes mellitus without complication ([place])   . Ectopic pregnancy   . High cholesterol   . Hypertension   . Low back pain   . Obesity   . Thyroid disease     Patient Active Problem List   Diagnosis Date Noted  . Atypical chest pain 11/18/2017  . Chronic right-sided low back pain without sciatica 07/11/2016  . Vitamin D insufficiency 01/13/2016  . Menstrual cycle problem 01/11/2016  . Hordeolum externum (stye) 01/11/2016  . Obesity (BMI 30.0-34.9) 10/01/2015  . Chronic systolic heart failure ([place]) 09/21/2015  . History of noncompliance with medical treatment 09/21/2015  . Long-term use of aspirin therapy 09/21/2015  . Palpitations 07/26/2015  . GERD (gastroesophageal reflux disease) 07/26/2015  . Generalized anxiety disorder 07/26/2015  . Poor dentition 04/13/2015  . Cardiomegaly 09/23/2014  . Diabetes mellitus type 2 in obese ([place]) 07/03/2014  . Essential hypertension 07/03/2014  . Hyperlipidemia 07/03/2014  . Hypothyroidism 07/03/2014    Past Surgical History:  Procedure Laterality Date  . APPENDECTOMY    .  CHOLECYSTECTOMY       OB History   No obstetric history on file.     Family History  Problem Relation Age of Onset  . Diabetes Mother   . Diabetes Maternal Grandmother   . Colon cancer Maternal Uncle 67    Social History   Tobacco Use  . Smoking status: Never Smoker  . Smokeless tobacco: Never Used  Vaping Use  . Vaping Use: Never used  Substance Use Topics  . Alcohol use: Yes    Alcohol/week: 0.0 standard drinks    Comment: occ  . Drug use: No    Home Medications Prior to Admission medications   Medication Sig Start Date End Date Taking? Authorizing Provider  amLODipine (NORVASC) 10 MG tablet Take 1 tablet (10 mg total) by mouth daily. 10/22/19   Ladell Pier, MD  aspirin 81 MG tablet Take 1 tablet (81 mg total) by mouth daily. 10/17/17   Argentina Donovan, PA-C  atorvastatin (LIPITOR) 10 MG tablet Take 1 tablet (10 mg total) by mouth daily. 06/19/19 09/17/19  Park Liter, MD  carvedilol (COREG) 12.5 MG tablet Take 1.5 tablets (18.75 mg total) by mouth 2 (two) times daily with a meal. 08/05/19   Park Liter, MD  fluticasone (FLONASE) 50 MCG/ACT nasal spray Place 2 sprays into both nostrils daily. 05/26/19   Elsie Stain, MD  furosemide (LASIX) 20 MG tablet Take 1 tablet (20 mg total) by mouth 2 (two) times  daily. 07/18/19   Park Liter, MD  glucose blood (TRUE METRIX BLOOD GLUCOSE TEST) test strip Use as instructed. Please monitor blood glucose levels fasting and 1 hour after lunch and dinner. 07/03/18   Gildardo Pounds, NP  levothyroxine (SYNTHROID) 50 MCG tablet Take 1 tablet (50 mcg total) by mouth daily. 07/22/19   Ladell Pier, MD  lisinopril (ZESTRIL) 40 MG tablet Take 1 tablet (40 mg total) by mouth daily. 07/18/19   Park Liter, MD  metFORMIN (GLUCOPHAGE) 1000 MG tablet Take 1 tablet (1,000 mg total) by mouth 2 (two) times daily with a meal. 05/26/19   Elsie Stain, MD  methocarbamol (ROBAXIN) 500 MG tablet Take 1 tablet (500  mg total) by mouth every 8 (eight) hours as needed. 10/28/19   Long, Wonda Olds, MD  pantoprazole (PROTONIX) 40 MG tablet Take 1 tablet (40 mg total) by mouth daily. 07/22/19   Ladell Pier, MD  TRUEPLUS LANCETS 28G MISC Use as instructed. Please monitor blood glucose levels fasting and 1 hour after lunch and dinner. 07/03/18   Gildardo Pounds, NP    Allergies    Ibuprofen  Review of Systems   Review of Systems  All other systems reviewed and are negative.   Physical Exam Updated Vital Signs BP (!) 189/110 (BP Location: Right Arm)   Pulse 87   Temp 98.2 F (36.8 C) (Oral)   Resp 20   Ht 5\' 1"  (1.549 m)   Wt 77 kg   LMP 10/08/2013   SpO2 98%   BMI 32.07 kg/m   Physical Exam Vitals and nursing note reviewed.  Constitutional:      General: She is not in acute distress.    Appearance: Normal appearance. She is not ill-appearing.  HENT:     Head: Normocephalic and atraumatic.  Pulmonary:     Effort: Pulmonary effort is normal.  Musculoskeletal:     Comments: The right lower extremity appears grossly normal and is symmetrical with the left.  There is mild tenderness to the posterior calf.  There is no palpable cord.  Bevelyn Buckles' sign is absent bilaterally.  DP pulses and PT pulses are easily palpable and motor and sensation is intact to the entire foot.  The right knee appears grossly normal with no effusion.  She has good range of motion.  Skin:    General: Skin is warm and dry.  Neurological:     Mental Status: She is alert.     ED Results / Procedures / Treatments   Labs (all labs ordered are listed, but only abnormal results are displayed) Labs Reviewed  BASIC METABOLIC PANEL  D-DIMER, QUANTITATIVE (NOT AT [facility])  CBC WITH DIFFERENTIAL/PLATELET    EKG None  Radiology No results found.  Procedures Procedures (including critical care time)  Medications Ordered in ED Medications - No data to display  ED Course  I have reviewed the triage vital signs and the  nursing notes.  Pertinent labs & imaging results that were available during my care of the patient were reviewed by me and considered in my medical decision making (see chart for details).    MDM Rules/Calculators/A&P  Patient presents with calf pain.  I suspect a musculoskeletal etiology, however cannot rule out DVT with D-dimer.  Patient will return today for an ultrasound to rule this out.  Will recommend ibuprofen in the meantime.  Final Clinical Impression(s) / ED Diagnoses Final diagnoses:  None    Rx / DC Orders ED  Discharge Orders    None       Veryl Speak, MD 11/01/19 [phone]

## 2019-11-01 NOTE — ED Triage Notes (Signed)
Patient presents with complaints of right lower extremity pain; states seen here 4 days ago for knee pain; States feel on right knee 1 week ago. Ambulatory with steady gait.

## 2019-11-01 NOTE — Discharge Instructions (Addendum)
Take ibuprofen as prescribed as needed for pain.  Return this morning at the given time for an ultrasound to rule out DVT.

## 2019-11-25 ENCOUNTER — Other Ambulatory Visit: Payer: Self-pay | Admitting: Internal Medicine

## 2019-11-25 DIAGNOSIS — E039 Hypothyroidism, unspecified: Secondary | ICD-10-CM

## 2019-11-25 DIAGNOSIS — I10 Essential (primary) hypertension: Secondary | ICD-10-CM

## 2019-11-25 DIAGNOSIS — K219 Gastro-esophageal reflux disease without esophagitis: Secondary | ICD-10-CM

## 2019-11-25 DIAGNOSIS — E669 Obesity, unspecified: Secondary | ICD-10-CM

## 2019-11-25 NOTE — Telephone Encounter (Signed)
Requested medication (s) are due for refill today: yes  Requested medication (s) are on the active medication list: yes   Future visit scheduled: yes  Notes to clinic: Patient has a upcoming appointment Patient was due for follow up before refills Be review for short supply   Requested Prescriptions  Pending Prescriptions Disp Refills   amLODipine (NORVASC) 10 MG tablet 30 tablet 0    Sig: Take 1 tablet (10 mg total) by mouth daily.      Cardiovascular:  Calcium Channel Blockers Failed - 11/25/2019 10:44 AM      Failed - Last BP in normal range    BP Readings from Last 1 Encounters:  11/01/19 (!) 169/113          Passed - Valid encounter within last 6 months    Recent Outpatient Visits           6 months ago Diabetes mellitus type 2 in obese [facility])   [place], [place], [place]   6 months ago Diabetes mellitus type 2 in obese [facility])   [place] Elsie Stain, MD   8 months ago Diabetes mellitus type 2 in obese [facility])   [place], Deborah B, MD   1 year ago Suspected Covid-19 Virus Infection   [place] Elsie Stain, MD   1 year ago Fever, unspecified fever cause   [place], [place], [place]       Future Appointments             In 2 weeks Ladell Pier, MD [place]              atorvastatin (LIPITOR) 10 MG tablet 90 tablet 1    Sig: Take 1 tablet (10 mg total) by mouth daily.      Cardiovascular:  Antilipid - Statins Failed - 11/25/2019 10:44 AM      Failed - Total Cholesterol in normal range and within 360 days    Cholesterol, Total  Date Value Ref Range Status  06/16/2019 200 (H) 100 - 199 mg/dL Final          Failed - LDL in normal range and within 360 days    LDL Chol Calc (NIH)  Date Value Ref Range Status  06/16/2019 111 (H) 0  - 99 mg/dL Final          Failed - Triglycerides in normal range and within 360 days    Triglycerides  Date Value Ref Range Status  06/16/2019 182 (H) 0 - 149 mg/dL Final          Passed - HDL in normal range and within 360 days    HDL  Date Value Ref Range Status  06/16/2019 57 >39 mg/dL Final          Passed - Patient is not pregnant      Passed - Valid encounter within last 12 months    Recent Outpatient Visits           6 months ago Diabetes mellitus type 2 in obese [facility])   [place], [place], [place]   6 months ago Diabetes mellitus type 2 in obese [facility])   [place], Patrick E, MD   8 months ago Diabetes mellitus type 2 in obese [facility])  [place] Ladell Pier, MD   1 year ago Suspected Covid-19 Virus Infection   [place] Elsie Stain, MD   1 year ago Fever, unspecified fever cause   [place], Dionne [place], [place]       Future Appointments             In 2 weeks Ladell Pier, MD [place]              carvedilol (COREG) 12.5 MG tablet 270 tablet 1    Sig: Take 1.5 tablets (18.75 mg total) by mouth 2 (two) times daily with a meal.      Cardiovascular:  Beta Blockers Failed - 11/25/2019 10:44 AM      Failed - Last BP in normal range    BP Readings from Last 1 Encounters:  11/01/19 (!) 169/113          Passed - Last Heart Rate in normal range    Pulse Readings from Last 1 Encounters:  11/01/19 91          Passed - Valid encounter within last 6 months    Recent Outpatient Visits           6 months ago Diabetes mellitus type 2 in obese [facility])   [place], [place], [place]   6 months ago Diabetes mellitus type 2 in obese [facility])   [place] Elsie Stain, MD    8 months ago Diabetes mellitus type 2 in obese [facility])   [place], Deborah B, MD   1 year ago Suspected Covid-19 Virus Infection   [place] Elsie Stain, MD   1 year ago Fever, unspecified fever cause   [place], [place], [place]       Future Appointments             In 2 weeks Ladell Pier, MD [place]              furosemide (LASIX) 20 MG tablet 180 tablet 1    Sig: Take 1 tablet (20 mg total) by mouth 2 (two) times daily.      Cardiovascular:  Diuretics - Loop Failed - 11/25/2019 10:44 AM      Failed - Ca in normal range and within 360 days    Calcium  Date Value Ref Range Status  11/01/2019 8.4 (L) 8.9 - 10.3 mg/dL Final          Failed - Cr in normal range and within 360 days    Creat  Date Value Ref Range Status  07/11/2016 1.21 (H) 0.50 - 1.10 mg/dL Final   Creatinine, Ser  Date Value Ref Range Status  11/01/2019 1.64 (H) 0.44 - 1.00 mg/dL Final   Creatinine, POC  Date Value Ref Range Status  01/08/2017 100 mg/dL Final   Creatinine, Urine  Date Value Ref Range Status  04/13/2015 34 20 - 320 mg/dL Final          Failed - Last BP in normal range    BP Readings from Last 1 Encounters:  11/01/19 (!) 169/113          Passed - K in normal range and within 360 days    Potassium  Date Value Ref Range Status  11/01/2019 3.6 3.5 - 5.1 mmol/L Final          Passed - Na in normal range and within 360 days    Sodium  Date Value Ref Range Status  11/01/2019 139 135 - 145 mmol/L Final  07/03/2018 144 134 - 144 mmol/L Final          Passed - Valid encounter within last 6 months    Recent Outpatient Visits           6 months ago Diabetes mellitus type 2 in obese [facility])   [place], [place], [place]   6 months ago Diabetes mellitus type 2 in obese [facility])   [place] Elsie Stain, MD   8 months ago Diabetes mellitus type 2 in obese [facility])   [place], Deborah B, MD   1 year ago Suspected Covid-19 Virus Infection   [place] Elsie Stain, MD   1 year ago Fever, unspecified fever cause   [place], [place], [place]       Future Appointments             In 2 weeks Ladell Pier, MD [place]              levothyroxine (SYNTHROID) 50 MCG tablet 30 tablet 2    Sig: Take 1 tablet (50 mcg total) by mouth daily.      Endocrinology:  Hypothyroid Agents Failed - 11/25/2019 10:44 AM      Failed - TSH needs to be rechecked within 3 months after an abnormal result. Refill until TSH is due.      Passed - TSH in normal range and within 360 days    TSH  Date Value Ref Range Status  06/16/2019 1.790 0.450 - 4.500 uIU/mL Final          Passed - Valid encounter within last 12 months    Recent Outpatient Visits           6 months ago Diabetes mellitus type 2 in obese [facility])   [place], [place], [place]   6 months ago Diabetes mellitus type 2 in obese [facility])   [place] Elsie Stain, MD   8 months ago Diabetes mellitus type 2 in obese [facility])   [place], Deborah B, MD   1 year ago Suspected Covid-19 Virus Infection   [place] Elsie Stain, MD   1 year ago Fever, unspecified fever cause   [place], [place], [place]       Future Appointments             In 2 weeks Ladell Pier, MD [place]              lisinopril (ZESTRIL) 40 MG tablet 90 tablet 1    Sig: Take 1 tablet (40 mg total) by mouth daily.      Cardiovascular:  ACE  Inhibitors Failed - 11/25/2019 10:44 AM      Failed - Cr in normal range and within 180 days    Creat  Date Value Ref Range Status  07/11/2016 1.21 (H) 0.50 -  1.10 mg/dL Final   Creatinine, Ser  Date Value Ref Range Status  11/01/2019 1.64 (H) 0.44 - 1.00 mg/dL Final   Creatinine, POC  Date Value Ref Range Status  01/08/2017 100 mg/dL Final   Creatinine, Urine  Date Value Ref Range Status  04/13/2015 34 20 - 320 mg/dL Final          Failed - Last BP in normal range    BP Readings from Last 1 Encounters:  11/01/19 (!) 169/113          Passed - K in normal range and within 180 days    Potassium  Date Value Ref Range Status  11/01/2019 3.6 3.5 - 5.1 mmol/L Final          Passed - Patient is not pregnant      Passed - Valid encounter within last 6 months    Recent Outpatient Visits           6 months ago Diabetes mellitus type 2 in obese [facility])   [place], [place], [place]   6 months ago Diabetes mellitus type 2 in obese [facility])   [place] Elsie Stain, MD   8 months ago Diabetes mellitus type 2 in obese [facility])   [place], Deborah B, MD   1 year ago Suspected Covid-19 Virus Infection   [place] Elsie Stain, MD   1 year ago Fever, unspecified fever cause   [place], [place], [place]       Future Appointments             In 2 weeks Ladell Pier, MD [place]              metFORMIN (GLUCOPHAGE) 1000 MG tablet 180 tablet 0    Sig: Take 1 tablet (1,000 mg total) by mouth 2 (two) times daily with a meal.      Endocrinology:  Diabetes - Biguanides Failed - 11/25/2019 10:44 AM      Failed - Cr in normal range and within 360 days    Creat  Date Value Ref Range Status  07/11/2016 1.21 (H) 0.50 - 1.10 mg/dL Final   Creatinine, Ser  Date  Value Ref Range Status  11/01/2019 1.64 (H) 0.44 - 1.00 mg/dL Final   Creatinine, POC  Date Value Ref Range Status  01/08/2017 100 mg/dL Final   Creatinine, Urine  Date Value Ref Range Status  04/13/2015 34 20 - 320 mg/dL Final          Failed - HBA1C is between 0 and 7.9 and within 180 days    Hemoglobin A1C  Date Value Ref Range Status  07/03/2018 6.6 (A) 4.0 - 5.6 % Final          Failed - eGFR in normal range and within 360 days    GFR, Est African American  Date Value Ref Range Status  07/11/2016 62 >=60 mL/min Final   GFR calc Af Amer  Date Value Ref Range Status  11/01/2019 42 (L) >60 mL/min Final   GFR, Est Non African American  Date Value Ref Range Status  07/11/2016 53 (L) >=60 mL/min Final   GFR calc non Af Amer  Date Value Ref Range Status  11/01/2019 36 (L) >60 mL/min Final          Passed -  Valid encounter within last 6 months    Recent Outpatient Visits           6 months ago Diabetes mellitus type 2 in obese [facility])   [place], [place], [place]   6 months ago Diabetes mellitus type 2 in obese [facility])   [place] Elsie Stain, MD   8 months ago Diabetes mellitus type 2 in obese [facility])   [place], Deborah B, MD   1 year ago Suspected Covid-19 Virus Infection   [place] Elsie Stain, MD   1 year ago Fever, unspecified fever cause   [place], [place], [place]       Future Appointments             In 2 weeks Ladell Pier, MD [place]              pantoprazole (PROTONIX) 40 MG tablet 30 tablet 2    Sig: Take 1 tablet (40 mg total) by mouth daily.      Gastroenterology: Proton Pump Inhibitors Passed - 11/25/2019 10:44 AM      Passed - Valid encounter within last 12 months    Recent Outpatient Visits           6  months ago Diabetes mellitus type 2 in obese [facility])   [place], [place], [place]   6 months ago Diabetes mellitus type 2 in obese [facility])   [place] Elsie Stain, MD   8 months ago Diabetes mellitus type 2 in obese [facility])   [place], Deborah B, MD   1 year ago Suspected Covid-19 Virus Infection   [place], [place]   1 year ago Fever, unspecified fever cause   [place], [place], [place]       Future Appointments             In 2 weeks Ladell Pier, MD [place]

## 2019-11-25 NOTE — Telephone Encounter (Signed)
Pt request refill  amLODipine (NORVASC) 10 MG tablet atorvastatin (LIPITOR) 10 MG tablet(Expired)  carvedilol (COREG) 12.5 MG tablet  furosemide (LASIX) 20 MG tablet levothyroxine (SYNTHROID) 50 MCG tablet lisinopril (ZESTRIL) 40 MG tablet metFORMIN (GLUCOPHAGE) 1000 MG tablet pantoprazole (PROTONIX) 40 MG tablet  [place], [place] Phone:  [phone]  Fax:  [phone]

## 2019-11-28 NOTE — Telephone Encounter (Signed)
Pt has not been seen here since 05/2019. She recently went to the ED and lab work showed worsening renal function. I'd like for Dr. Wynetta Emery to review this before we approve refills.

## 2019-12-01 MED ORDER — ATORVASTATIN CALCIUM 10 MG PO TABS: 10.0000 mg | ORAL_TABLET | Freq: Every day | ORAL | 0 refills | Status: DC

## 2019-12-01 MED ORDER — LEVOTHYROXINE SODIUM 50 MCG PO TABS: 50.0000 ug | ORAL_TABLET | Freq: Every day | ORAL | 0 refills | Status: DC

## 2019-12-01 MED ORDER — AMLODIPINE BESYLATE 10 MG PO TABS: 10.0000 mg | ORAL_TABLET | Freq: Every day | ORAL | 0 refills | Status: DC

## 2019-12-01 MED ORDER — PANTOPRAZOLE SODIUM 40 MG PO TBEC: 40.0000 mg | DELAYED_RELEASE_TABLET | Freq: Every day | ORAL | 0 refills | Status: DC

## 2019-12-01 MED ORDER — CARVEDILOL 12.5 MG PO TABS: 18.7500 mg | ORAL_TABLET | Freq: Two times a day (BID) | ORAL | 0 refills | Status: DC

## 2019-12-01 MED ORDER — LISINOPRIL 40 MG PO TABS: 40.0000 mg | ORAL_TABLET | Freq: Every day | ORAL | 0 refills | Status: DC

## 2019-12-01 MED ORDER — FUROSEMIDE 20 MG PO TABS: 20.0000 mg | ORAL_TABLET | Freq: Two times a day (BID) | ORAL | 0 refills | Status: DC

## 2019-12-01 MED ORDER — METFORMIN HCL 1000 MG PO TABS: 1000.0000 mg | ORAL_TABLET | Freq: Two times a day (BID) | ORAL | 0 refills | Status: DC

## 2019-12-12 ENCOUNTER — Inpatient Hospital Stay: Payer: Self-pay | Admitting: Internal Medicine

## 2019-12-29 ENCOUNTER — Inpatient Hospital Stay: Payer: Self-pay | Admitting: Internal Medicine

## 2020-02-27 ENCOUNTER — Other Ambulatory Visit: Payer: Self-pay | Admitting: Internal Medicine

## 2020-02-27 DIAGNOSIS — K219 Gastro-esophageal reflux disease without esophagitis: Secondary | ICD-10-CM

## 2020-02-27 DIAGNOSIS — E039 Hypothyroidism, unspecified: Secondary | ICD-10-CM

## 2020-02-27 DIAGNOSIS — I10 Essential (primary) hypertension: Secondary | ICD-10-CM

## 2020-02-27 DIAGNOSIS — E1169 Type 2 diabetes mellitus with other specified complication: Secondary | ICD-10-CM

## 2020-02-27 MED ORDER — LEVOTHYROXINE SODIUM 50 MCG PO TABS: 50.0000 ug | ORAL_TABLET | Freq: Every day | ORAL | 0 refills | Status: DC

## 2020-02-27 MED ORDER — PANTOPRAZOLE SODIUM 40 MG PO TBEC: 40.0000 mg | DELAYED_RELEASE_TABLET | Freq: Every day | ORAL | 0 refills | Status: DC

## 2020-02-27 MED ORDER — METFORMIN HCL 1000 MG PO TABS: 1000.0000 mg | ORAL_TABLET | Freq: Two times a day (BID) | ORAL | 0 refills | Status: DC

## 2020-02-27 MED ORDER — FUROSEMIDE 20 MG PO TABS: 20.0000 mg | ORAL_TABLET | Freq: Two times a day (BID) | ORAL | 0 refills | Status: DC

## 2020-02-27 MED ORDER — ATORVASTATIN CALCIUM 10 MG PO TABS: 10.0000 mg | ORAL_TABLET | Freq: Every day | ORAL | 0 refills | Status: DC

## 2020-02-27 MED ORDER — AMLODIPINE BESYLATE 10 MG PO TABS: 10.0000 mg | ORAL_TABLET | Freq: Every day | ORAL | 0 refills | Status: DC

## 2020-02-27 MED ORDER — LISINOPRIL 40 MG PO TABS: 40.0000 mg | ORAL_TABLET | Freq: Every day | ORAL | 0 refills | Status: DC

## 2020-02-27 NOTE — Telephone Encounter (Signed)
Medication Refill - Medication: pantoprazole, metformin, levothyroxine, amlodipine, lisinopril, furosemide, atorvastatin  Has the patient contacted their pharmacy? Yes.   Pt states that she is completely out of these medications. Please advise.  (Agent: If no, request that the patient contact the pharmacy for the refill.) (Agent: If yes, when and what did the pharmacy advise?)  Preferred Pharmacy (with phone number or street name):  [place] [place]  [address] [place] [address]  Phone: [phone] Fax: [phone]  Hours: Not open 24 hours     Agent: Please be advised that RX refills may take up to 3 business days. We ask that you follow-up with your pharmacy.

## 2020-03-22 ENCOUNTER — Other Ambulatory Visit: Payer: Self-pay

## 2020-03-22 ENCOUNTER — Ambulatory Visit: Payer: Self-pay | Attending: Internal Medicine | Admitting: Internal Medicine

## 2020-03-22 ENCOUNTER — Ambulatory Visit: Payer: Self-pay | Admitting: Internal Medicine

## 2020-03-22 DIAGNOSIS — I10 Essential (primary) hypertension: Secondary | ICD-10-CM

## 2020-03-22 DIAGNOSIS — E039 Hypothyroidism, unspecified: Secondary | ICD-10-CM

## 2020-03-22 DIAGNOSIS — E669 Obesity, unspecified: Secondary | ICD-10-CM

## 2020-03-22 DIAGNOSIS — E1169 Type 2 diabetes mellitus with other specified complication: Secondary | ICD-10-CM

## 2020-03-22 DIAGNOSIS — M7731 Calcaneal spur, right foot: Secondary | ICD-10-CM

## 2020-03-22 DIAGNOSIS — K219 Gastro-esophageal reflux disease without esophagitis: Secondary | ICD-10-CM

## 2020-03-22 MED ORDER — PANTOPRAZOLE SODIUM 40 MG PO TBEC: 40.0000 mg | DELAYED_RELEASE_TABLET | Freq: Every day | ORAL | 0 refills | Status: DC

## 2020-03-22 MED ORDER — METFORMIN HCL 1000 MG PO TABS: 1000.0000 mg | ORAL_TABLET | Freq: Two times a day (BID) | ORAL | 0 refills | Status: DC

## 2020-03-22 MED ORDER — FUROSEMIDE 20 MG PO TABS: 20.0000 mg | ORAL_TABLET | Freq: Two times a day (BID) | ORAL | 0 refills | Status: DC

## 2020-03-22 MED ORDER — LEVOTHYROXINE SODIUM 50 MCG PO TABS: 50.0000 ug | ORAL_TABLET | Freq: Every day | ORAL | 0 refills | Status: DC

## 2020-03-22 MED ORDER — AMLODIPINE BESYLATE 10 MG PO TABS: 10.0000 mg | ORAL_TABLET | Freq: Every day | ORAL | 0 refills | Status: DC

## 2020-03-22 MED ORDER — LISINOPRIL 40 MG PO TABS: 40.0000 mg | ORAL_TABLET | Freq: Every day | ORAL | 0 refills | Status: DC

## 2020-03-22 MED ORDER — ATORVASTATIN CALCIUM 10 MG PO TABS
10.0000 mg | ORAL_TABLET | Freq: Every day | ORAL | 0 refills | Status: DC
Start: 2020-03-22 — End: 2020-08-25

## 2020-03-22 MED ORDER — CARVEDILOL 12.5 MG PO TABS: 18.7500 mg | ORAL_TABLET | Freq: Two times a day (BID) | ORAL | 0 refills | Status: DC

## 2020-03-22 NOTE — Progress Notes (Signed)
Virtual Visit via Telephone Note This was suppose to be an in-pt visit.  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  I connected with Linda Phelps on 03/22/20 at 9:43 a.m by telephone and verified that I am speaking with the correct person using two identifiers.  Location: Patient: at car inspection place Provider: office   I discussed the limitations, risks, security and privacy concerns of performing an evaluation and management service by telephone and the availability of in person appointments. I also discussed with the patient that there may be a patient responsible charge related to this service. The patient expressed understanding and agreed to proceed.   History of Present Illness: Pt with hx of HTN, DM, HL, obesity.  Last 3 eval with [place] have been telephone visits.   Reports she was coming in to be eval for RT posterior heel pain but canceled because she was seen at [facility] and was dx with heel spur around the achilles tendon.  No x-rays done.  The provider who saw her recommended better walking shoes since she does a lot of walking at work.  Also given shoe insert.    Outpatient Encounter Medications as of 03/22/2020  Medication Sig  . amLODipine (NORVASC) 10 MG tablet Take 1 tablet (10 mg total) by mouth daily.  Marland Kitchen aspirin 81 MG tablet Take 1 tablet (81 mg total) by mouth daily.  Marland Kitchen atorvastatin (LIPITOR) 10 MG tablet Take 1 tablet (10 mg total) by mouth daily.  . carvedilol (COREG) 12.5 MG tablet Take 1.5 tablets (18.75 mg total) by mouth 2 (two) times daily with a meal.  . fluticasone (FLONASE) 50 MCG/ACT nasal spray Place 2 sprays into both nostrils daily.  . furosemide (LASIX) 20 MG tablet Take 1 tablet (20 mg total) by mouth 2 (two) times daily.  Marland Kitchen glucose blood (TRUE METRIX BLOOD GLUCOSE TEST) test strip Use as instructed. Please monitor blood glucose levels fasting and 1 hour after lunch and  dinner.  . levothyroxine (SYNTHROID) 50 MCG tablet Take 1 tablet (50 mcg total) by mouth daily.  Marland Kitchen lisinopril (ZESTRIL) 40 MG tablet Take 1 tablet (40 mg total) by mouth daily.  . metFORMIN (GLUCOPHAGE) 1000 MG tablet Take 1 tablet (1,000 mg total) by mouth 2 (two) times daily with a meal.  . methocarbamol (ROBAXIN) 500 MG tablet Take 1 tablet (500 mg total) by mouth every 8 (eight) hours as needed.  . pantoprazole (PROTONIX) 40 MG tablet Take 1 tablet (40 mg total) by mouth daily.  . TRUEPLUS LANCETS 28G MISC Use as instructed. Please monitor blood glucose levels fasting and 1 hour after lunch and dinner.   No facility-administered encounter medications on file as of 03/22/2020.      Observations/Objective: No direct observation done as this was a telephone encounter  Assessment and Plan: 1. Heel spur, right If not improving, we can refer to podiatry.  Advise pt that she needs an in-person visit for chronic ds management otherwise we will not be able to continue to do RFs on meds.  She has been scheduled for the 22nd of this mth   Follow Up Instructions: As stated above.   I discussed the assessment and treatment plan with the patient. The patient was provided an opportunity to ask questions and all were answered. The patient agreed with the plan and demonstrated an understanding of the instructions.   The patient was advised  to call back or seek an in-person evaluation if the symptoms worsen or if the condition fails to improve as anticipated.  I provided 5 minutes of non-face-to-face time during this encounter.   Karle Plumber, MD

## 2020-04-12 ENCOUNTER — Ambulatory Visit: Payer: Self-pay | Admitting: Internal Medicine

## 2020-06-01 DIAGNOSIS — E119 Type 2 diabetes mellitus without complications: Secondary | ICD-10-CM | POA: Insufficient documentation

## 2020-06-01 DIAGNOSIS — I509 Heart failure, unspecified: Secondary | ICD-10-CM | POA: Insufficient documentation

## 2020-06-01 DIAGNOSIS — M545 Low back pain, unspecified: Secondary | ICD-10-CM | POA: Insufficient documentation

## 2020-06-01 DIAGNOSIS — E669 Obesity, unspecified: Secondary | ICD-10-CM | POA: Insufficient documentation

## 2020-06-01 DIAGNOSIS — E7800 Pure hypercholesterolemia, unspecified: Secondary | ICD-10-CM | POA: Insufficient documentation

## 2020-06-01 DIAGNOSIS — E079 Disorder of thyroid, unspecified: Secondary | ICD-10-CM | POA: Insufficient documentation

## 2020-06-01 DIAGNOSIS — O009 Unspecified ectopic pregnancy without intrauterine pregnancy: Secondary | ICD-10-CM | POA: Insufficient documentation

## 2020-06-01 DIAGNOSIS — I10 Essential (primary) hypertension: Secondary | ICD-10-CM | POA: Insufficient documentation

## 2020-06-03 ENCOUNTER — Ambulatory Visit: Payer: Self-pay | Admitting: Cardiology

## 2020-07-26 ENCOUNTER — Other Ambulatory Visit: Payer: Self-pay | Admitting: Internal Medicine

## 2020-07-26 DIAGNOSIS — E1169 Type 2 diabetes mellitus with other specified complication: Secondary | ICD-10-CM

## 2020-07-26 DIAGNOSIS — K219 Gastro-esophageal reflux disease without esophagitis: Secondary | ICD-10-CM

## 2020-07-26 DIAGNOSIS — I10 Essential (primary) hypertension: Secondary | ICD-10-CM

## 2020-07-26 DIAGNOSIS — E039 Hypothyroidism, unspecified: Secondary | ICD-10-CM

## 2020-07-26 DIAGNOSIS — E669 Obesity, unspecified: Secondary | ICD-10-CM

## 2020-07-26 NOTE — Telephone Encounter (Signed)
Notes to clinic: Patient requesting enough medication until appt  on 08/26/2020 Last appt was a no show Review for short supply    Requested Prescriptions  Pending Prescriptions Disp Refills   levothyroxine (SYNTHROID) 50 MCG tablet 30 tablet 0    Sig: Take 1 tablet (50 mcg total) by mouth daily.      Endocrinology:  Hypothyroid Agents Failed - 07/26/2020  9:36 AM      Failed - TSH needs to be rechecked within 3 months after an abnormal result. Refill until TSH is due.      Failed - TSH in normal range and within 360 days    TSH  Date Value Ref Range Status  06/16/2019 1.790 0.450 - 4.500 uIU/mL Final          Passed - Valid encounter within last 12 months    Recent Outpatient Visits           4 months ago Heel spur, right   [place], Deborah B, MD   1 year ago Diabetes mellitus type 2 in obese [facility])   [place], [place], [place]   1 year ago Diabetes mellitus type 2 in obese [facility])   [place] Elsie Stain, MD   1 year ago Diabetes mellitus type 2 in obese [facility])   [place], MD   1 year ago Suspected Covid-19 Virus Infection   [place], MD       Future Appointments             In 1 month Thereasa Solo Dionne Bucy, PA-C [place]               amLODipine (NORVASC) 10 MG tablet 30 tablet 0    Sig: Take 1 tablet (10 mg total) by mouth daily.      Cardiovascular:  Calcium Channel Blockers Failed - 07/26/2020  9:36 AM      Failed - Last BP in normal range    BP Readings from Last 1 Encounters:  11/01/19 (!) 169/113          Passed - Valid encounter within last 6 months    Recent Outpatient Visits           4 months ago Heel spur, right   [place], Deborah B, MD   1 year ago Diabetes  mellitus type 2 in obese [facility])   [place], [place], [place]   1 year ago Diabetes mellitus type 2 in obese [facility])   [place] Elsie Stain, MD   1 year ago Diabetes mellitus type 2 in obese [facility])   [place] Ladell Pier, MD   1 year ago Suspected Covid-19 Virus Infection   [place], MD       Future Appointments             In 1 month Thereasa Solo Dionne Bucy, PA-C [place]               atorvastatin (LIPITOR) 10 MG tablet 30 tablet 0    Sig: Take 1 tablet (10 mg total) by mouth daily.      Cardiovascular:  Antilipid - Statins Failed - 07/26/2020  9:36 AM      Failed - Total Cholesterol in normal range and within 360 days    Cholesterol, Total  Date Value Ref Range Status  06/16/2019 200 (H) 100 - 199 mg/dL Final          Failed - LDL in normal range and within 360 days    LDL Chol Calc (NIH)  Date Value Ref Range Status  06/16/2019 111 (H) 0 - 99 mg/dL Final          Failed - HDL in normal range and within 360 days    HDL  Date Value Ref Range Status  06/16/2019 57 >39 mg/dL Final          Failed - Triglycerides in normal range and within 360 days    Triglycerides  Date Value Ref Range Status  06/16/2019 182 (H) 0 - 149 mg/dL Final          Passed - Patient is not pregnant      Passed - Valid encounter within last 12 months    Recent Outpatient Visits           4 months ago Heel spur, right   [place], Deborah B, MD   1 year ago Diabetes mellitus type 2 in obese [facility])   [place], [place], [place]   1 year ago Diabetes mellitus type 2 in obese [facility])   [place] Elsie Stain, MD   1 year ago Diabetes mellitus type 2 in obese [facility])   [place] Ladell Pier, MD   1 year ago Suspected Covid-19 Virus Infection   [place], MD       Future Appointments             In 1 month Thereasa Solo Dionne Bucy, PA-C [place]               lisinopril (ZESTRIL) 40 MG tablet 30 tablet 0    Sig: Take 1 tablet (40 mg total) by mouth daily.      Cardiovascular:  ACE Inhibitors Failed - 07/26/2020  9:36 AM      Failed - Cr in normal range and within 180 days    Creat  Date Value Ref Range Status  07/11/2016 1.21 (H) 0.50 - 1.10 mg/dL Final   Creatinine, Ser  Date Value Ref Range Status  11/01/2019 1.64 (H) 0.44 - 1.00 mg/dL Final   Creatinine, POC  Date Value Ref Range Status  01/08/2017 100 mg/dL Final   Creatinine, Urine  Date Value Ref Range Status  04/13/2015 34 20 - 320 mg/dL Final          Failed - K in normal range and within 180 days    Potassium  Date Value Ref Range Status  11/01/2019 3.6 3.5 - 5.1 mmol/L Final          Failed - Last BP in normal range    BP Readings from Last 1 Encounters:  11/01/19 (!) 169/113          Passed - Patient is not pregnant      Passed - Valid encounter within last 6 months    Recent Outpatient Visits           4 months ago Heel spur, right  [place] Ladell Pier, MD   1 year ago Diabetes mellitus type 2 in obese [facility])   [place], [place], [place]   1 year ago Diabetes mellitus type 2 in obese [facility])   [place] Elsie Stain, MD   1 year ago Diabetes mellitus type 2 in obese [facility])   [place] Ladell Pier, MD   1 year ago Suspected Covid-19 Virus Infection   [place], MD       Future Appointments             In 1 month Thereasa Solo [place]               pantoprazole (PROTONIX) 40 MG tablet 30 tablet 0    Sig: Take 1 tablet (40 mg total) by mouth daily.      Gastroenterology: Proton Pump Inhibitors Passed - 07/26/2020  9:36 AM      Passed - Valid encounter within last 12 months    Recent Outpatient Visits           4 months ago Heel spur, right   [place], Deborah B, MD   1 year ago Diabetes mellitus type 2 in obese [facility])   [place], [place], [place]   1 year ago Diabetes mellitus type 2 in obese [facility])   [place] Elsie Stain, MD   1 year ago Diabetes mellitus type 2 in obese [facility])   [place], Deborah B, MD   1 year ago Suspected Covid-19 Virus Infection   [place] Elsie Stain, MD       Future Appointments             In 1 month Thereasa Solo, Dionne Bucy, PA-C [place]               metFORMIN (GLUCOPHAGE) 1000 MG tablet 60 tablet 0    Sig: Take 1 tablet (1,000 mg total) by mouth 2 (two) times daily with a meal.      Endocrinology:  Diabetes - Biguanides Failed - 07/26/2020  9:36 AM      Failed - Cr in normal range and within 360 days    Creat  Date Value Ref Range Status  07/11/2016 1.21 (H) 0.50 - 1.10 mg/dL Final   Creatinine, Ser  Date Value Ref Range Status  11/01/2019 1.64 (H) 0.44 - 1.00 mg/dL Final   Creatinine, POC  Date Value Ref Range Status  01/08/2017 100 mg/dL Final   Creatinine, Urine  Date Value Ref Range Status  04/13/2015 34 20 - 320 mg/dL Final          Failed - HBA1C is between 0 and 7.9 and within 180 days    Hemoglobin A1C  Date Value Ref Range Status  07/03/2018 6.6 (A) 4.0 - 5.6 % Final          Failed - AA eGFR in normal range and within 360 days    GFR, Est African American  Date Value Ref Range Status   07/11/2016 62 >=60 mL/min Final   GFR calc Af Amer  Date Value Ref Range Status  11/01/2019 42 (L) >  60 mL/min Final   GFR, Est Non African American  Date Value Ref Range Status  07/11/2016 53 (L) >=60 mL/min Final   GFR calc non Af Amer  Date Value Ref Range Status  11/01/2019 36 (L) >60 mL/min Final          Passed - Valid encounter within last 6 months    Recent Outpatient Visits           4 months ago Heel spur, right   [place], Deborah B, MD   1 year ago Diabetes mellitus type 2 in obese [facility])   [place], [place], [place]   1 year ago Diabetes mellitus type 2 in obese [facility])   [place] Elsie Stain, MD   1 year ago Diabetes mellitus type 2 in obese [facility])   [place], Deborah B, MD   1 year ago Suspected Covid-19 Virus Infection   [place] Elsie Stain, MD       Future Appointments             In 1 month Thereasa Solo, [place]

## 2020-07-26 NOTE — Telephone Encounter (Signed)
Relation to pt: self  Call back number: [phone]   Patient requesting the following medication to hold her over until her 08/25/2020 appointment:  furosemide (LASIX) 20 MG tablet ,  levothyroxine (SYNTHROID) 50 MCG tablet , amLODipine (NORVASC) 10 MG tablet , atorvastatin (LIPITOR) 10 MG tablet , metFORMIN (GLUCOPHAGE) 1000 MG tablet , lisinopril (ZESTRIL) 40 MG tablet , pantoprazole (PROTONIX) 40 MG tablet   Patient contacted pharmacy and was advised to follow up with PCP.   [place], [place] Phone:  [phone]  Fax:  [phone]

## 2020-08-25 ENCOUNTER — Ambulatory Visit: Payer: Self-pay | Attending: Physician Assistant | Admitting: Physician Assistant

## 2020-08-25 ENCOUNTER — Other Ambulatory Visit: Payer: Self-pay

## 2020-08-25 DIAGNOSIS — E119 Type 2 diabetes mellitus without complications: Secondary | ICD-10-CM

## 2020-08-25 DIAGNOSIS — K219 Gastro-esophageal reflux disease without esophagitis: Secondary | ICD-10-CM

## 2020-08-25 DIAGNOSIS — E039 Hypothyroidism, unspecified: Secondary | ICD-10-CM

## 2020-08-25 DIAGNOSIS — E7800 Pure hypercholesterolemia, unspecified: Secondary | ICD-10-CM

## 2020-08-25 DIAGNOSIS — E1169 Type 2 diabetes mellitus with other specified complication: Secondary | ICD-10-CM

## 2020-08-25 DIAGNOSIS — E669 Obesity, unspecified: Secondary | ICD-10-CM

## 2020-08-25 DIAGNOSIS — I10 Essential (primary) hypertension: Secondary | ICD-10-CM

## 2020-08-25 DIAGNOSIS — M545 Low back pain, unspecified: Secondary | ICD-10-CM

## 2020-08-25 DIAGNOSIS — G8929 Other chronic pain: Secondary | ICD-10-CM

## 2020-08-25 MED ORDER — CARVEDILOL 12.5 MG PO TABS: 18.7500 mg | ORAL_TABLET | Freq: Two times a day (BID) | ORAL | 3 refills | Status: DC

## 2020-08-25 MED ORDER — METFORMIN HCL 1000 MG PO TABS: 1000.0000 mg | ORAL_TABLET | Freq: Two times a day (BID) | ORAL | 0 refills | Status: DC

## 2020-08-25 MED ORDER — PANTOPRAZOLE SODIUM 40 MG PO TBEC: 40.0000 mg | DELAYED_RELEASE_TABLET | Freq: Every day | ORAL | 3 refills | Status: DC

## 2020-08-25 MED ORDER — AMLODIPINE BESYLATE 10 MG PO TABS
10.0000 mg | ORAL_TABLET | Freq: Every day | ORAL | 3 refills | Status: DC
Start: 2020-08-25 — End: 2021-03-03

## 2020-08-25 MED ORDER — LEVOTHYROXINE SODIUM 50 MCG PO TABS: 50.0000 ug | ORAL_TABLET | Freq: Every day | ORAL | 3 refills | Status: DC

## 2020-08-25 MED ORDER — FLUTICASONE PROPIONATE 50 MCG/ACT NA SUSP: 2.0000 | Freq: Every day | NASAL | 2 refills | Status: DC

## 2020-08-25 MED ORDER — CYCLOBENZAPRINE HCL 10 MG PO TABS: 10.0000 mg | ORAL_TABLET | Freq: Every day | ORAL | 0 refills | Status: DC

## 2020-08-25 MED ORDER — LISINOPRIL 40 MG PO TABS: 40.0000 mg | ORAL_TABLET | Freq: Every day | ORAL | 3 refills | Status: DC

## 2020-08-25 MED ORDER — ATORVASTATIN CALCIUM 10 MG PO TABS: 10.0000 mg | ORAL_TABLET | Freq: Every day | ORAL | 2 refills | Status: DC

## 2020-08-25 MED ORDER — FUROSEMIDE 20 MG PO TABS: 20.0000 mg | ORAL_TABLET | Freq: Two times a day (BID) | ORAL | 3 refills | Status: DC

## 2020-08-25 NOTE — Progress Notes (Signed)
Patient ID: Linda Phelps, female   DOB: 11-May-1971, 50 y.o.   MRN: [id number] Virtual Visit via Telephone Note  I connected with Dorcas Carrow on 08/25/20 at  2:10 PM EDT by telephone and verified that I am speaking with the correct person using two identifiers.  Location: Patient: home Provider: [facility] office   I discussed the limitations, risks, security and privacy concerns of performing an evaluation and management service by telephone and the availability of in person appointments. I also discussed with the patient that there may be a patient responsible charge related to this service. The patient expressed understanding and agreed to proceed.   History of Present Illness:  Patient needs RF of meds.  She is willing to come get labs tomorrow morning.  Blood sugars running from 80-120.  She feels good overall other than her lower back pain has flared up a little and she would like a RF of flexeril to use occasionally.      Observations/Objective: NAD.  A&Ox3  Assessment and Plan: 1. Essential hypertension - amLODipine (NORVASC) 10 MG tablet; Take 1 tablet (10 mg total) by mouth daily.  Dispense: 30 tablet; Refill: 3 - carvedilol (COREG) 12.5 MG tablet; Take 1.5 tablets (18.75 mg total) by mouth 2 (two) times daily with a meal.  Dispense: 60 tablet; Refill: 3 - furosemide (LASIX) 20 MG tablet; Take 1 tablet (20 mg total) by mouth 2 (two) times daily.  Dispense: 60 tablet; Refill: 3 - lisinopril (ZESTRIL) 40 MG tablet; Take 1 tablet (40 mg total) by mouth daily.  Dispense: 30 tablet; Refill: 3 - Comprehensive metabolic panel; Future  2. Hypothyroidism, unspecified type - levothyroxine (SYNTHROID) 50 MCG tablet; Take 1 tablet (50 mcg total) by mouth daily.  Dispense: 30 tablet; Refill: 3 - Thyroid Panel With TSH; Future - Comprehensive metabolic panel; Future  3. Diabetes mellitus type 2 in obese (HCC) - metFORMIN (GLUCOPHAGE) 1000 MG tablet; Take 1 tablet (1,000 mg total) by mouth 2 (two)  times daily with a meal.  Dispense: 60 tablet; Refill: 0 - Hemoglobin A1c; Future - Comprehensive metabolic panel; Future  4. Gastroesophageal reflux disease without esophagitis - pantoprazole (PROTONIX) 40 MG tablet; Take 1 tablet (40 mg total) by mouth daily.  Dispense: 30 tablet; Refill: 3  5. Pure hypercholesterolemia - Lipid panel; Future - atorvastatin (LIPITOR) 10 MG tablet; Take 1 tablet (10 mg total) by mouth daily.  Dispense: 30 tablet; Refill: 2  6. Chronic low back pain, unspecified back pain laterality, unspecified whether sciatica present - cyclobenzaprine (FLEXERIL) 10 MG tablet; Take 1 tablet (10 mg total) by mouth at bedtime.  Dispense: 30 tablet; Refill: 0   Follow Up Instructions: See PCP in 3 months for DM/htn/thyroid   I discussed the assessment and treatment plan with the patient. The patient was provided an opportunity to ask questions and all were answered. The patient agreed with the plan and demonstrated an understanding of the instructions.   The patient was advised to call back or seek an in-person evaluation if the symptoms worsen or if the condition fails to improve as anticipated.  I provided 14 minutes of non-face-to-face time during this encounter.   Freeman Caldron, PA-C

## 2020-09-13 ENCOUNTER — Telehealth: Payer: Self-pay | Admitting: Internal Medicine

## 2020-09-13 NOTE — Telephone Encounter (Signed)
Pt had appt with [facility] 4/6 and needed 3 month f/u with Dr.Johnson for DM/htn/thyroid. Called Pt no answer and unable to leave vm.

## 2020-09-13 NOTE — Telephone Encounter (Signed)
-----   Message from [place], [place] sent at 08/25/2020  2:12 PM EDT ----- See PCP in 3 months for DM/htn/thyroid

## 2020-10-27 ENCOUNTER — Telehealth: Payer: Self-pay | Admitting: Internal Medicine

## 2020-10-27 NOTE — Telephone Encounter (Signed)
Copied from [place] [id number]. Topic: Quick Communication - Rx Refill/Question >> Oct 25, 2020 12:45 PM [place] wrote: Medication: metFORMIN (GLUCOPHAGE) 1000 MG tablet 60 tablet 0 08/25/2020   Sig - Route: Take 1 tablet (1,000 mg total) by mouth 2 (two) times daily with a meal. - Oral  Sent to pharmacy as: metFORMIN (GLUCOPHAGE) 1000 MG tablet  E-Prescribing Status: Receipt confirmed by pharmacy (08/25/2020 2:08 PM EDT)     Has the patient contacted their pharmacy? yes (Agent: If no, request that the patient contact the pharmacy for the refill.) pt changing pharmacies (Agent: If yes, when and what did the pharmacy advise?)  Preferred Pharmacy (with phone number or street name): [place] [id number] Phone: [phone] Fax: [phone] Hours: Not open 24 hours    Agent: Please be advised that RX refills may take up to 3 business days. We ask that you follow-up with your pharmacy.

## 2020-10-27 NOTE — Telephone Encounter (Signed)
Patient was instructed by PCP to return to office for lab test in April.  Unable to refill until lab test are completed to refill medication.   Attempted to f/u with patient to inform.  Voicemail is full, unable to leave a message.

## 2020-10-30 ENCOUNTER — Encounter: Payer: Self-pay | Admitting: Critical Care Medicine

## 2020-11-01 NOTE — Telephone Encounter (Signed)
Received another message from patient. Called and and LVM advising her I was calling from [facility] in regards to a message we received from her. Advised her to call [phone] to discuss.   If patient calls back please advise her of nurse message.

## 2020-11-23 ENCOUNTER — Other Ambulatory Visit: Payer: Self-pay

## 2020-11-23 ENCOUNTER — Ambulatory Visit: Payer: Self-pay | Attending: Family Medicine | Admitting: Nurse Practitioner

## 2020-11-23 ENCOUNTER — Encounter: Payer: Self-pay | Admitting: Nurse Practitioner

## 2020-11-23 DIAGNOSIS — Z1211 Encounter for screening for malignant neoplasm of colon: Secondary | ICD-10-CM

## 2020-11-23 DIAGNOSIS — E1165 Type 2 diabetes mellitus with hyperglycemia: Secondary | ICD-10-CM

## 2020-11-23 DIAGNOSIS — Z1231 Encounter for screening mammogram for malignant neoplasm of breast: Secondary | ICD-10-CM

## 2020-11-23 DIAGNOSIS — E785 Hyperlipidemia, unspecified: Secondary | ICD-10-CM

## 2020-11-23 DIAGNOSIS — E079 Disorder of thyroid, unspecified: Secondary | ICD-10-CM

## 2020-11-23 DIAGNOSIS — Z1159 Encounter for screening for other viral diseases: Secondary | ICD-10-CM

## 2020-11-23 DIAGNOSIS — R7989 Other specified abnormal findings of blood chemistry: Secondary | ICD-10-CM

## 2020-11-23 DIAGNOSIS — I10 Essential (primary) hypertension: Secondary | ICD-10-CM
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
## 2020-11-25 ENCOUNTER — Ambulatory Visit: Payer: Self-pay | Admitting: Family Medicine

## 2020-11-29 ENCOUNTER — Emergency Department (HOSPITAL_BASED_OUTPATIENT_CLINIC_OR_DEPARTMENT_OTHER): Payer: Self-pay

## 2020-11-29 ENCOUNTER — Encounter (HOSPITAL_BASED_OUTPATIENT_CLINIC_OR_DEPARTMENT_OTHER): Payer: Self-pay | Admitting: Emergency Medicine

## 2020-11-29 ENCOUNTER — Emergency Department (HOSPITAL_BASED_OUTPATIENT_CLINIC_OR_DEPARTMENT_OTHER)
Admission: EM | Admit: 2020-11-29 | Discharge: 2020-11-29 | Disposition: A | Discharge status: Home / Self Care | Payer: Self-pay | Attending: Emergency Medicine | Admitting: Emergency Medicine

## 2020-11-29 ENCOUNTER — Other Ambulatory Visit: Payer: Self-pay

## 2020-11-29 DIAGNOSIS — M19019 Primary osteoarthritis, unspecified shoulder: Secondary | ICD-10-CM

## 2020-11-29 DIAGNOSIS — E119 Type 2 diabetes mellitus without complications: Secondary | ICD-10-CM | POA: Insufficient documentation

## 2020-11-29 DIAGNOSIS — Z7982 Long term (current) use of aspirin: Secondary | ICD-10-CM | POA: Insufficient documentation

## 2020-11-29 DIAGNOSIS — M25512 Pain in left shoulder: Secondary | ICD-10-CM | POA: Diagnosis present

## 2020-11-29 DIAGNOSIS — I5022 Chronic systolic (congestive) heart failure: Secondary | ICD-10-CM | POA: Diagnosis not present

## 2020-11-29 DIAGNOSIS — Z7984 Long term (current) use of oral hypoglycemic drugs: Secondary | ICD-10-CM | POA: Insufficient documentation

## 2020-11-29 DIAGNOSIS — R030 Elevated blood-pressure reading, without diagnosis of hypertension: Secondary | ICD-10-CM

## 2020-11-29 DIAGNOSIS — Y92410 Unspecified street and highway as the place of occurrence of the external cause: Secondary | ICD-10-CM | POA: Diagnosis not present

## 2020-11-29 DIAGNOSIS — M129 Arthropathy, unspecified: Secondary | ICD-10-CM | POA: Diagnosis not present

## 2020-11-29 DIAGNOSIS — E039 Hypothyroidism, unspecified: Secondary | ICD-10-CM | POA: Diagnosis not present

## 2020-11-29 DIAGNOSIS — Z79899 Other long term (current) drug therapy: Secondary | ICD-10-CM | POA: Insufficient documentation

## 2020-11-29 DIAGNOSIS — Y9389 Activity, other specified: Secondary | ICD-10-CM | POA: Diagnosis not present

## 2020-11-29 DIAGNOSIS — I110 Hypertensive heart disease with heart failure: Secondary | ICD-10-CM | POA: Diagnosis not present

## 2020-11-29 DIAGNOSIS — R519 Headache, unspecified: Secondary | ICD-10-CM | POA: Insufficient documentation

## 2020-11-29 NOTE — Discharge Instructions (Addendum)
Your xray showed some mild degenerative changes and spurring in one of your shoulder joints which may be contributing to your pain today. Please follow up with sports medicine physician Dr. Raeford Razor for further evaluation. Wear sling as needed for comfort.   While at home please rest and ice your shoulder to help with pain. Take Ibuprofen and Tylenol as needed for pain.   Please follow up with your PCP regarding your elevated blood pressure reading today. It may be elevated secondary to you being out of blood pressure medications for the last few days. Keep a log of your blood pressure readings to take to your PCP.   Return to the ED for any new/worsening symptoms
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
## 2021-02-25 ENCOUNTER — Ambulatory Visit: Payer: Self-pay

## 2021-03-02 ENCOUNTER — Other Ambulatory Visit: Payer: Self-pay | Admitting: Physician Assistant

## 2021-03-02 DIAGNOSIS — I10 Essential (primary) hypertension: Secondary | ICD-10-CM

## 2021-03-02 NOTE — Telephone Encounter (Signed)
Pt called to add these medications to her list, she says she has no refills and is completely out of her current supply.  furosemide (LASIX) 20 MG tablet atorvastatin (LIPITOR) 10 MG tablet levothyroxine (SYNTHROID) 50 MCG tablet carvedilol (COREG) 12.5 MG tablet pantoprazole (PROTONIX) 40 MG tablet

## 2021-03-03 NOTE — Telephone Encounter (Signed)
Requested Prescriptions  Pending Prescriptions Disp Refills  . lisinopril (ZESTRIL) 40 MG tablet [Pharmacy Med Name: Lisinopril 40 MG Oral Tablet] 90 tablet 0    Sig: Take 1 tablet by mouth once daily     Cardiovascular:  ACE Inhibitors Failed - 03/02/2021  4:42 PM      Failed - Cr in normal range and within 180 days    Creat  Date Value Ref Range Status  07/11/2016 1.21 (H) 0.50 - 1.10 mg/dL Final   Creatinine, Ser  Date Value Ref Range Status  11/01/2019 1.64 (H) 0.44 - 1.00 mg/dL Final   Creatinine, POC  Date Value Ref Range Status  01/08/2017 100 mg/dL Final   Creatinine, Urine  Date Value Ref Range Status  04/13/2015 34 20 - 320 mg/dL Final         Failed - K in normal range and within 180 days    Potassium  Date Value Ref Range Status  11/01/2019 3.6 3.5 - 5.1 mmol/L Final         Failed - Last BP in normal range    BP Readings from Last 1 Encounters:  11/29/20 (!) 192/85         Passed - Patient is not pregnant      Passed - Valid encounter within last 6 months    Recent Outpatient Visits          3 months ago Primary hypertension   [place], Vernia Buff, NP   6 months ago Pure hypercholesterolemia   [place], [place], [place]   11 months ago Heel spur, right   [place], Dalbert Batman, MD   1 year ago Diabetes mellitus type 2 in obese [facility])   [place], [place], [place]   1 year ago Diabetes mellitus type 2 in obese [facility])   [place] Elsie Stain, MD      Future Appointments            In 1 month Gildardo Pounds, NP [place]           . amLODipine (NORVASC) 10 MG tablet [Pharmacy Med Name: amLODIPine Besylate 10 MG Oral Tablet] 90 tablet 0    Sig: Take 1 tablet by mouth once daily     Cardiovascular:  Calcium Channel Blockers  Failed - 03/02/2021  4:42 PM      Failed - Last BP in normal range    BP Readings from Last 1 Encounters:  11/29/20 (!) 192/85         Passed - Valid encounter within last 6 months    Recent Outpatient Visits          3 months ago Primary hypertension   [place], Vernia Buff, NP   6 months ago Pure hypercholesterolemia   [place], Levada Dy M, [place]   11 months ago Heel spur, right   [place], Deborah B, MD   1 year ago Diabetes mellitus type 2 in obese [facility])   [place], [place]   1 year ago Diabetes mellitus type 2 in obese [facility])   [place] Elsie Stain, MD      Future Appointments  In 1 month Gildardo Pounds, NP [place]

## 2021-04-07 ENCOUNTER — Other Ambulatory Visit: Payer: Self-pay | Admitting: Internal Medicine

## 2021-04-07 ENCOUNTER — Other Ambulatory Visit: Payer: Self-pay | Admitting: Physician Assistant

## 2021-04-07 DIAGNOSIS — I10 Essential (primary) hypertension: Secondary | ICD-10-CM

## 2021-04-07 NOTE — Telephone Encounter (Signed)
Requested medications are due for refill today NO, requesting too soon  Requested medications are on the active medication list yes  Last refill 03/19/21 for 90 day supply  Last visit 11/23/20  Future visit scheduled 04/22/21  Notes to clinic  requesting too soon. Please assess.

## 2021-04-07 NOTE — Telephone Encounter (Signed)
Requested medications are due for refill today yes  Requested medications are on the active medication list yes  Last refill 01/27/21  Last visit 11/23/20  Future visit scheduled 04/22/21  Notes to clinic failed protocol of valid labs within 360 days, please assess. Requested Prescriptions  Pending Prescriptions Disp Refills   furosemide (LASIX) 20 MG tablet [Pharmacy Med Name: Furosemide 20 MG Oral Tablet] 60 tablet 0    Sig: Take 1 tablet by mouth twice daily     Cardiovascular:  Diuretics - Loop Failed - 04/07/2021 11:01 AM      Failed - K in normal range and within 360 days    Potassium  Date Value Ref Range Status  11/01/2019 3.6 3.5 - 5.1 mmol/L Final          Failed - Ca in normal range and within 360 days    Calcium  Date Value Ref Range Status  11/01/2019 8.4 (L) 8.9 - 10.3 mg/dL Final          Failed - Na in normal range and within 360 days    Sodium  Date Value Ref Range Status  11/01/2019 139 135 - 145 mmol/L Final  07/03/2018 144 134 - 144 mmol/L Final          Failed - Cr in normal range and within 360 days    Creat  Date Value Ref Range Status  07/11/2016 1.21 (H) 0.50 - 1.10 mg/dL Final   Creatinine, Ser  Date Value Ref Range Status  11/01/2019 1.64 (H) 0.44 - 1.00 mg/dL Final   Creatinine, POC  Date Value Ref Range Status  01/08/2017 100 mg/dL Final   Creatinine, Urine  Date Value Ref Range Status  04/13/2015 34 20 - 320 mg/dL Final          Failed - Last BP in normal range    BP Readings from Last 1 Encounters:  11/29/20 (!) 192/85          Passed - Valid encounter within last 6 months    Recent Outpatient Visits           4 months ago Primary hypertension   [place], Vernia Buff, NP   7 months ago Pure hypercholesterolemia   [place], [place], [place]   1 year ago Heel spur, right   [place], Deborah B, MD   1  year ago Diabetes mellitus type 2 in obese [facility])   [place], [place], [place]   1 year ago Diabetes mellitus type 2 in obese [facility])   [place] Elsie Stain, MD       Future Appointments             In 2 weeks Gildardo Pounds, NP [place]

## 2021-04-19 ENCOUNTER — Telehealth: Payer: Self-pay | Admitting: Internal Medicine

## 2021-04-19 DIAGNOSIS — E1121 Type 2 diabetes mellitus with diabetic nephropathy: Secondary | ICD-10-CM

## 2021-04-19 NOTE — Telephone Encounter (Signed)
Copied from [place] [id number]. Topic: General - Other >> Apr 11, 2021  2:14 PM Pawlus, Brayton Layman A wrote: Reason for CRM: Pt was just discharged from the hospital today (11/21) and wanted to know if Dr Wynetta Emery could send in a blood sugar monitor so she could check her blood sugar levels, please advise.

## 2021-04-20 NOTE — Telephone Encounter (Signed)
Lurena Joiner would you be able to send in supplies
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
## 2021-04-21 ENCOUNTER — Inpatient Hospital Stay: Payer: Self-pay | Admitting: Physician Assistant

## 2021-04-21 DIAGNOSIS — E1165 Type 2 diabetes mellitus with hyperglycemia: Secondary | ICD-10-CM

## 2021-04-21 MED ORDER — TRUE METRIX BLOOD GLUCOSE TEST VI STRP: ORAL_STRIP | 3 refills | Status: AC

## 2021-04-21 MED ORDER — TRUE METRIX METER W/DEVICE KIT: PACK | 0 refills | Status: DC

## 2021-04-21 MED ORDER — TRUEPLUS LANCETS 28G MISC: 3 refills | Status: AC

## 2021-04-21 NOTE — Telephone Encounter (Signed)
Sent to [facility] pharmacy. I attempted to call her and make her aware of this but pt did not answer. No VM setup.

## 2021-04-22 ENCOUNTER — Ambulatory Visit: Payer: Self-pay | Admitting: Nurse Practitioner

## 2021-05-09 ENCOUNTER — Other Ambulatory Visit: Payer: Self-pay | Admitting: Physician Assistant

## 2021-05-09 DIAGNOSIS — E7800 Pure hypercholesterolemia, unspecified: Secondary | ICD-10-CM

## 2021-05-10 NOTE — Telephone Encounter (Signed)
Requested Prescriptions  Pending Prescriptions Disp Refills   atorvastatin (LIPITOR) 10 MG tablet [Pharmacy Med Name: Atorvastatin Calcium 10 MG Oral Tablet] 90 tablet 0    Sig: Take 1 tablet by mouth once daily     Cardiovascular:  Antilipid - Statins Failed - 05/09/2021  3:54 PM      Failed - Total Cholesterol in normal range and within 360 days    Cholesterol, Total  Date Value Ref Range Status  06/16/2019 200 (H) 100 - 199 mg/dL Final         Failed - LDL in normal range and within 360 days    LDL Chol Calc (NIH)  Date Value Ref Range Status  06/16/2019 111 (H) 0 - 99 mg/dL Final         Failed - HDL in normal range and within 360 days    HDL  Date Value Ref Range Status  06/16/2019 57 >39 mg/dL Final         Failed - Triglycerides in normal range and within 360 days    Triglycerides  Date Value Ref Range Status  06/16/2019 182 (H) 0 - 149 mg/dL Final         Passed - Patient is not pregnant      Passed - Valid encounter within last 12 months    Recent Outpatient Visits          5 months ago Primary hypertension   [place], Vernia Buff, NP   8 months ago Pure hypercholesterolemia   [place], [place], [place]   1 year ago Heel spur, right   [place], Deborah B, MD   1 year ago Diabetes mellitus type 2 in obese [facility])   [place], [place]   1 year ago Diabetes mellitus type 2 in obese [facility])   [place], MD      Future Appointments            In 2 months Ladell Pier, MD [place]

## 2021-05-12 ENCOUNTER — Telehealth: Payer: Self-pay | Admitting: Internal Medicine

## 2021-05-12 NOTE — Telephone Encounter (Signed)
Medication Refill - Medication: Glipizide   Has the patient contacted their pharmacy? No. (Agent: If no, request that the patient contact the pharmacy for the refill. If patient does not wish to contact the pharmacy document the reason why and proceed with request.) (Agent: If yes, when and what did the pharmacy advise?)  Preferred Pharmacy (with phone number or street name):  [place] [address]  Phone: [phone] Fax: [phone]  Hours: Not open 24 hours   Has the patient been seen for an appointment in the last year OR does the patient have an upcoming appointment? Yes.    Agent: Please be advised that RX refills may take up to 3 business days. We ask that you follow-up with your pharmacy.

## 2021-05-12 NOTE — Telephone Encounter (Signed)
Patient called and advised glipizide is not on her current medication list. She says she was in the hospital last month and was started on Glipizide 2 mg daily to take with Metformin to help lower blood sugar. She says she took her last one today and will need a refill sent in to the pharmacy. BS readings in 90's. I advised I will send this to Dr. Wynetta Emery for review/refill.

## 2021-05-13 MED ORDER — GLIPIZIDE ER 2.5 MG PO TB24: 2.5000 mg | ORAL_TABLET | Freq: Every day | ORAL | 0 refills | Status: DC

## 2021-05-13 NOTE — Telephone Encounter (Signed)
Scheduled appt. Jan 31 at 1010.  Patient aware, in person.  May need a RF to carry her over to next appt.   Last A1C, November Per Dr. Wynetta Emery, f/u prior to next RF.

## 2021-05-13 NOTE — Addendum Note (Signed)
Addended by: Karle Plumber B on: 05/13/2021 09:41 AM   Modules accepted: Orders

## 2021-05-30 ENCOUNTER — Other Ambulatory Visit: Payer: Self-pay | Admitting: Internal Medicine

## 2021-05-30 DIAGNOSIS — I10 Essential (primary) hypertension: Secondary | ICD-10-CM

## 2021-05-30 DIAGNOSIS — K219 Gastro-esophageal reflux disease without esophagitis: Secondary | ICD-10-CM

## 2021-05-30 MED ORDER — PANTOPRAZOLE SODIUM 40 MG PO TBEC: 40.0000 mg | DELAYED_RELEASE_TABLET | Freq: Every day | ORAL | 3 refills | Status: DC

## 2021-05-30 NOTE — Telephone Encounter (Signed)
Requested medication (s) are on the active medication list: yes  Future visit scheduled: 06/21/21  Notes to clinic:  Failed protocol of labs within 180 days, (labs 10/2019) has upcoming appt, please assess.  NO SHOW twice in Dec, was to return in Sept and did not, has upcoming appt Jan 31/2023, please assess.      Requested Prescriptions  Pending Prescriptions Disp Refills   furosemide (LASIX) 20 MG tablet 60 tablet 3    Sig: Take 1 tablet (20 mg total) by mouth 2 (two) times daily.     Cardiovascular:  Diuretics - Loop Failed - 05/30/2021 12:18 PM      Failed - K in normal range and within 360 days    Potassium  Date Value Ref Range Status  11/01/2019 3.6 3.5 - 5.1 mmol/L Final          Failed - Ca in normal range and within 360 days    Calcium  Date Value Ref Range Status  11/01/2019 8.4 (L) 8.9 - 10.3 mg/dL Final          Failed - Na in normal range and within 360 days    Sodium  Date Value Ref Range Status  11/01/2019 139 135 - 145 mmol/L Final  07/03/2018 144 134 - 144 mmol/L Final          Failed - Cr in normal range and within 360 days    Creat  Date Value Ref Range Status  07/11/2016 1.21 (H) 0.50 - 1.10 mg/dL Final   Creatinine, Ser  Date Value Ref Range Status  11/01/2019 1.64 (H) 0.44 - 1.00 mg/dL Final   Creatinine, POC  Date Value Ref Range Status  01/08/2017 100 mg/dL Final   Creatinine, Urine  Date Value Ref Range Status  04/13/2015 34 20 - 320 mg/dL Final          Failed - Last BP in normal range    BP Readings from Last 1 Encounters:  11/29/20 (!) 192/85          Failed - Valid encounter within last 6 months    Recent Outpatient Visits           6 months ago Primary hypertension   [place], Vernia Buff, NP   9 months ago Pure hypercholesterolemia   [place], [place], [place]   1 year ago Heel spur, right   [place],  Neoma Laming B, MD   2 years ago Diabetes mellitus type 2 in obese [facility])   [place], [place], [place]   2 years ago Diabetes mellitus type 2 in obese [facility])   [place] Elsie Stain, MD       Future Appointments             In 3 weeks Ladell Pier, MD [place]   In 1 month Ladell Pier, MD [place]             carvedilol (COREG) 12.5 MG tablet 60 tablet 3    Sig: Take 1.5 tablets (18.75 mg total) by mouth 2 (two) times daily with a meal.     Cardiovascular:  Beta Blockers Failed - 05/30/2021 12:18 PM      Failed - Last BP in normal range    BP Readings from Last 1  Encounters:  11/29/20 (!) 192/85          Failed - Valid encounter within last 6 months    Recent Outpatient Visits           6 months ago Primary hypertension   [place], Vernia Buff, NP   9 months ago Pure hypercholesterolemia   [place], [place], [place]   1 year ago Heel spur, right   [place], Deborah B, MD   2 years ago Diabetes mellitus type 2 in obese [facility])   [place], [place], [place]   2 years ago Diabetes mellitus type 2 in obese [facility])   [place], MD       Future Appointments             In 3 weeks Ladell Pier, MD [place]   In 1 month Ladell Pier, MD [place]            Passed - Last Heart Rate in normal range    Pulse Readings from Last 1 Encounters:  11/29/20 80           lisinopril (ZESTRIL) 40 MG tablet 90 tablet 0    Sig: Take 1 tablet (40 mg total) by mouth daily.     Cardiovascular:  ACE Inhibitors Failed - 05/30/2021 12:18 PM      Failed - Cr in  normal range and within 180 days    Creat  Date Value Ref Range Status  07/11/2016 1.21 (H) 0.50 - 1.10 mg/dL Final   Creatinine, Ser  Date Value Ref Range Status  11/01/2019 1.64 (H) 0.44 - 1.00 mg/dL Final   Creatinine, POC  Date Value Ref Range Status  01/08/2017 100 mg/dL Final   Creatinine, Urine  Date Value Ref Range Status  04/13/2015 34 20 - 320 mg/dL Final          Failed - K in normal range and within 180 days    Potassium  Date Value Ref Range Status  11/01/2019 3.6 3.5 - 5.1 mmol/L Final          Failed - Last BP in normal range    BP Readings from Last 1 Encounters:  11/29/20 (!) 192/85          Failed - Valid encounter within last 6 months    Recent Outpatient Visits           6 months ago Primary hypertension   [place], Vernia Buff, NP   9 months ago Pure hypercholesterolemia   [place], [place], [place]   1 year ago Heel spur, right   [place], Deborah B, MD   2 years ago Diabetes mellitus type 2 in obese [facility])   [place], [place], [place]   2 years ago Diabetes mellitus type 2 in obese [facility])   [place], MD       Future Appointments             In 3 weeks Ladell Pier, MD [place]   In 1 month Wynetta Emery Dalbert [place], MD [facility]  [place] - Patient is not pregnant       amLODipine (NORVASC) 10 MG tablet 90 tablet 0    Sig: Take 1 tablet (10 mg total) by mouth daily.     Cardiovascular:  Calcium Channel Blockers Failed - 05/30/2021 12:18 PM      Failed - Last BP in normal range    BP Readings from Last 1 Encounters:  11/29/20 (!) 192/85          Failed - Valid encounter within last 6 months    Recent Outpatient Visits           6 months ago Primary  hypertension   [place], Vernia Buff, NP   9 months ago Pure hypercholesterolemia   [place], [place], [place]   1 year ago Heel spur, right   [place], Deborah B, MD   2 years ago Diabetes mellitus type 2 in obese [facility])   [place], [place], [place]   2 years ago Diabetes mellitus type 2 in obese [facility])   [place], MD       Future Appointments             In 3 weeks Ladell Pier, MD [place]   In 1 month Ladell Pier, MD [place]            Signed Prescriptions Disp Refills   pantoprazole (PROTONIX) 40 MG tablet 30 tablet 3    Sig: Take 1 tablet (40 mg total) by mouth daily.     Gastroenterology: Proton Pump Inhibitors Passed - 05/30/2021 12:18 PM      Passed - Valid encounter within last 12 months    Recent Outpatient Visits           6 months ago Primary hypertension   [place], Vernia Buff, NP   9 months ago Pure hypercholesterolemia   [place], [place], [place]   1 year ago Heel spur, right   [place], MD   2 years ago Diabetes mellitus type 2 in obese [facility])   [place], [place], [place]   2 years ago Diabetes mellitus type 2 in obese [facility])   [place], MD       Future Appointments             In 3 weeks Ladell Pier, MD [place]   In 1 month Wynetta Emery, Dalbert Batman, MD [place]

## 2021-05-30 NOTE — Telephone Encounter (Signed)
Medication: furosemide (LASIX) 20 MG tablet [[id number]] , carvedilol (COREG) 12.5 MG tablet [[id number]]  DISCONTINUED, lisinopril (ZESTRIL) 40 MG tablet [[id number]] , amLODipine (NORVASC) 10 MG tablet [[id number]] , pantoprazole (PROTONIX) 40 MG tablet [[id number]]   Has the patient contacted their pharmacy? YES advised to call the office (Agent: If no, request that the patient contact the pharmacy for the refill. If patient does not wish to contact the pharmacy document the reason why and proceed with request.) (Agent: If yes, when and what did the pharmacy advise?)  Preferred Pharmacy (with phone number or street name): [place] [address] Phone: [phone] Fax: [phone] Hours: Not open 24 hours   Has the patient been seen for an appointment in the last year OR does the patient have an upcoming appointment? YES 06/21/21  Agent: Please be advised that RX refills may take up to 3 business days. We ask that you follow-up with your pharmacy.

## 2021-06-01 MED ORDER — FUROSEMIDE 20 MG PO TABS: 20.0000 mg | ORAL_TABLET | Freq: Two times a day (BID) | ORAL | 0 refills | Status: DC

## 2021-06-01 MED ORDER — CARVEDILOL 12.5 MG PO TABS: 18.7500 mg | ORAL_TABLET | Freq: Two times a day (BID) | ORAL | 0 refills | Status: DC

## 2021-06-01 MED ORDER — LISINOPRIL 40 MG PO TABS: 40.0000 mg | ORAL_TABLET | Freq: Every day | ORAL | 0 refills | Status: DC

## 2021-06-01 MED ORDER — AMLODIPINE BESYLATE 10 MG PO TABS: 10.0000 mg | ORAL_TABLET | Freq: Every day | ORAL | 0 refills | Status: DC

## 2021-06-02 ENCOUNTER — Telehealth: Payer: Self-pay | Admitting: Critical Care Medicine

## 2021-06-21 ENCOUNTER — Ambulatory Visit: Payer: Self-pay | Admitting: Internal Medicine

## 2021-06-29 ENCOUNTER — Telehealth: Payer: Self-pay

## 2021-06-29 NOTE — Telephone Encounter (Signed)
Attempted to call patient, mailbox full. [facility] unable to leave contact information.

## 2021-07-07 ENCOUNTER — Other Ambulatory Visit: Payer: Self-pay | Admitting: Internal Medicine

## 2021-07-08 NOTE — Telephone Encounter (Signed)
Requested medication (s) are due for refill today: yes  Requested medication (s) are on the active medication list: yes  Last refill:  05/13/21 #30 with 0 RF  Future visit scheduled: 07/15/21  Notes to clinic:  Was seen in July and asked to return in Sept, has not returned, next visit scheduled 07/15/21. Labs are from 07/03/2018, visit from July, therefore failed protocol.      Requested Prescriptions  Pending Prescriptions Disp Refills   glipiZIDE (GLUCOTROL XL) 2.5 MG 24 hr tablet [Pharmacy Med Name: glipiZIDE ER 2.5 MG Oral Tablet Extended Release 24 Hour] 30 tablet 0    Sig: Take 1 tablet by mouth once daily with breakfast     Endocrinology:  Diabetes - Sulfonylureas Failed - 07/07/2021  8:35 PM      Failed - HBA1C is between 0 and 7.9 and within 180 days    Hemoglobin A1C  Date Value Ref Range Status  07/03/2018 6.6 (A) 4.0 - 5.6 % Final          Failed - Cr in normal range and within 360 days    Creat  Date Value Ref Range Status  07/11/2016 1.21 (H) 0.50 - 1.10 mg/dL Final   Creatinine, Ser  Date Value Ref Range Status  11/01/2019 1.64 (H) 0.44 - 1.00 mg/dL Final   Creatinine, POC  Date Value Ref Range Status  01/08/2017 100 mg/dL Final   Creatinine, Urine  Date Value Ref Range Status  04/13/2015 34 20 - 320 mg/dL Final          Failed - Valid encounter within last 6 months    Recent Outpatient Visits           7 months ago Primary hypertension   [place], Vernia Buff, NP   10 months ago Pure hypercholesterolemia   [place], [place], [place]   1 year ago Heel spur, right   [place], Deborah B, MD   2 years ago Diabetes mellitus type 2 in obese [facility])   [place], [place], [place]   2 years ago Diabetes mellitus type 2 in obese [facility])   [place], MD        Future Appointments             In 1 week Ladell Pier, MD [place]

## 2021-07-15 ENCOUNTER — Ambulatory Visit: Payer: No Typology Code available for payment source | Admitting: Internal Medicine

## 2021-07-26 ENCOUNTER — Other Ambulatory Visit: Payer: Self-pay | Admitting: Internal Medicine

## 2021-07-26 DIAGNOSIS — K219 Gastro-esophageal reflux disease without esophagitis: Secondary | ICD-10-CM

## 2021-07-26 DIAGNOSIS — I10 Essential (primary) hypertension: Secondary | ICD-10-CM

## 2021-07-26 MED ORDER — PANTOPRAZOLE SODIUM 40 MG PO TBEC: 40.0000 mg | DELAYED_RELEASE_TABLET | Freq: Every day | ORAL | 2 refills | Status: DC

## 2021-07-26 NOTE — Telephone Encounter (Signed)
Requested medication (s) are due for refill today: yes  Requested medication (s) are on the active medication list: yes  Last refill:  06/01/21   Future visit scheduled: yes in 2 weeks  Notes to clinic:  do you want a 2nd courtesy refill? Patient was no show for 06/21/21 OV     Requested Prescriptions  Pending Prescriptions Disp Refills   carvedilol (COREG) 12.5 MG tablet 90 tablet 0    Sig: Take 1.5 tablets (18.75 mg total) by mouth 2 (two) times daily with a meal.     Cardiovascular: Beta Blockers 3 Failed - 07/26/2021 10:27 AM      Failed - Cr in normal range and within 360 days    Creat  Date Value Ref Range Status  07/11/2016 1.21 (H) 0.50 - 1.10 mg/dL Final   Creatinine, Ser  Date Value Ref Range Status  11/01/2019 1.64 (H) 0.44 - 1.00 mg/dL Final   Creatinine, POC  Date Value Ref Range Status  01/08/2017 100 mg/dL Final   Creatinine, Urine  Date Value Ref Range Status  04/13/2015 34 20 - 320 mg/dL Final          Failed - AST in normal range and within 360 days    AST  Date Value Ref Range Status  06/05/2019 16 15 - 41 U/L Final          Failed - ALT in normal range and within 360 days    ALT  Date Value Ref Range Status  06/05/2019 15 0 - 44 U/L Final          Failed - Last BP in normal range    BP Readings from Last 1 Encounters:  11/29/20 (!) 192/85          Failed - Valid encounter within last 6 months    Recent Outpatient Visits           8 months ago Primary hypertension   [place], Vernia Buff, NP   11 months ago Pure hypercholesterolemia   [place], [place], [place]   1 year ago Heel spur, right   [place], Neoma Laming B, MD   2 years ago Diabetes mellitus type 2 in obese [facility])   [place], [place], [place]   2 years ago Diabetes mellitus type 2 in obese [facility])   [place] Elsie Stain, MD       Future Appointments             In 2 weeks Ladell Pier, MD [place]            Passed - Last Heart Rate in normal range    Pulse Readings from Last 1 Encounters:  11/29/20 80           furosemide (LASIX) 20 MG tablet 40 tablet 0    Sig: Take 1 tablet (20 mg total) by mouth 2 (two) times daily. Must keep appt 06/21/2021 for further refills     Cardiovascular:  Diuretics - Loop Failed - 07/26/2021 10:27 AM      Failed - K in normal range and within 180 days    Potassium  Date Value Ref Range Status  11/01/2019 3.6 3.5 - 5.1 mmol/L Final          Failed - Ca in normal range  and within 180 days    Calcium  Date Value Ref Range Status  11/01/2019 8.4 (L) 8.9 - 10.3 mg/dL Final          Failed - Na in normal range and within 180 days    Sodium  Date Value Ref Range Status  11/01/2019 139 135 - 145 mmol/L Final  07/03/2018 144 134 - 144 mmol/L Final          Failed - Cr in normal range and within 180 days    Creat  Date Value Ref Range Status  07/11/2016 1.21 (H) 0.50 - 1.10 mg/dL Final   Creatinine, Ser  Date Value Ref Range Status  11/01/2019 1.64 (H) 0.44 - 1.00 mg/dL Final   Creatinine, POC  Date Value Ref Range Status  01/08/2017 100 mg/dL Final   Creatinine, Urine  Date Value Ref Range Status  04/13/2015 34 20 - 320 mg/dL Final          Failed - Cl in normal range and within 180 days    Chloride  Date Value Ref Range Status  11/01/2019 105 98 - 111 mmol/L Final          Failed - Mg Level in normal range and within 180 days    No results found for: MG        Failed - Last BP in normal range    BP Readings from Last 1 Encounters:  11/29/20 (!) 192/85          Failed - Valid encounter within last 6 months    Recent Outpatient Visits           8 months ago Primary hypertension   [place], Vernia Buff, NP   11  months ago Pure hypercholesterolemia   [place], [place], [place]   1 year ago Heel spur, right   [place], Neoma Laming B, MD   2 years ago Diabetes mellitus type 2 in obese [facility])   [place], [place], [place]   2 years ago Diabetes mellitus type 2 in obese [facility])   [place] Elsie Stain, MD       Future Appointments             In 2 weeks Ladell Pier, MD [place]             glipiZIDE (GLUCOTROL XL) 2.5 MG 24 hr tablet 30 tablet 0    Sig: Take 1 tablet (2.5 mg total) by mouth daily with breakfast.     Endocrinology:  Diabetes - Sulfonylureas Failed - 07/26/2021 10:27 AM      Failed - HBA1C is between 0 and 7.9 and within 180 days    Hemoglobin A1C  Date Value Ref Range Status  07/03/2018 6.6 (A) 4.0 - 5.6 % Final          Failed - Cr in normal range and within 360 days    Creat  Date Value Ref Range Status  07/11/2016 1.21 (H) 0.50 - 1.10 mg/dL Final   Creatinine, Ser  Date Value Ref Range Status  11/01/2019 1.64 (H) 0.44 - 1.00 mg/dL Final   Creatinine, POC  Date Value Ref Range Status  01/08/2017 100 mg/dL Final   Creatinine, Urine  Date Value Ref Range Status  04/13/2015 34 20 - 320 mg/dL Final  Failed - Valid encounter within last 6 months    Recent Outpatient Visits           8 months ago Primary hypertension   [place], Vernia Buff, NP   11 months ago Pure hypercholesterolemia   [place], [place], [place]   1 year ago Heel spur, right   [place], Deborah B, MD   2 years ago Diabetes mellitus type 2 in obese [facility])   [place], [place], [place]   2 years ago Diabetes mellitus type 2 in obese [facility])    [place], MD       Future Appointments             In 2 weeks Ladell Pier, MD [place]             amLODipine (NORVASC) 10 MG tablet 30 tablet 0    Sig: Take 1 tablet (10 mg total) by mouth daily.     Cardiovascular: Calcium Channel Blockers 2 Failed - 07/26/2021 10:27 AM      Failed - Last BP in normal range    BP Readings from Last 1 Encounters:  11/29/20 (!) 192/85          Failed - Valid encounter within last 6 months    Recent Outpatient Visits           8 months ago Primary hypertension   [place], Vernia Buff, NP   11 months ago Pure hypercholesterolemia   [place], [place], [place]   1 year ago Heel spur, right   [place], Deborah B, MD   2 years ago Diabetes mellitus type 2 in obese [facility])   [place], [place], [place]   2 years ago Diabetes mellitus type 2 in obese [facility])   [place] Elsie Stain, MD       Future Appointments             In 2 weeks Ladell Pier, MD [place] - Last Heart Rate in normal range    Pulse Readings from Last 1 Encounters:  11/29/20 80           lisinopril (ZESTRIL) 40 MG tablet 20 tablet 0    Sig: Take 1 tablet (40 mg total) by mouth daily. Must keep appt 06/21/2021 for further refills     Cardiovascular:  ACE Inhibitors Failed - 07/26/2021 10:27 AM      Failed - Cr in normal range and within 180 days    Creat  Date Value Ref Range Status  07/11/2016 1.21 (H) 0.50 - 1.10 mg/dL Final   Creatinine, Ser  Date Value Ref Range Status  11/01/2019 1.64 (H) 0.44 - 1.00 mg/dL Final   Creatinine, POC  Date Value Ref Range Status  01/08/2017 100 mg/dL Final   Creatinine, Urine  Date Value  Ref Range Status  04/13/2015 34 20 - 320 mg/dL Final          Failed - K in normal range and within 180 days    Potassium  Date Value Ref  Range Status  11/01/2019 3.6 3.5 - 5.1 mmol/L Final          Failed - Last BP in normal range    BP Readings from Last 1 Encounters:  11/29/20 (!) 192/85          Failed - Valid encounter within last 6 months    Recent Outpatient Visits           8 months ago Primary hypertension   [place], Vernia Buff, NP   11 months ago Pure hypercholesterolemia   [place], [place], [place]   1 year ago Heel spur, right   [place], Deborah B, MD   2 years ago Diabetes mellitus type 2 in obese [facility])   [place], [place], [place]   2 years ago Diabetes mellitus type 2 in obese [facility])   [place] Elsie Stain, MD       Future Appointments             In 2 weeks Ladell Pier, MD [place] - Patient is not pregnant      Signed Prescriptions Disp Refills   pantoprazole (PROTONIX) 40 MG tablet 30 tablet 2    Sig: Take 1 tablet (40 mg total) by mouth daily.     Gastroenterology: Proton Pump Inhibitors Passed - 07/26/2021 10:27 AM      Passed - Valid encounter within last 12 months    Recent Outpatient Visits           8 months ago Primary hypertension   [place], Vernia Buff, NP   11 months ago Pure hypercholesterolemia   [place], [place], [place]   1 year ago Heel spur, right   [place], MD   2 years ago Diabetes mellitus type 2 in obese [facility])   [place], [place], [place]   2 years ago Diabetes mellitus type 2 in obese [facility])   [place], MD       Future Appointments             In 2 weeks Ladell Pier, MD [place]

## 2021-07-26 NOTE — Telephone Encounter (Signed)
Copied from [place] [id number]. Topic: Quick Communication - Rx Refill/Question ?>> Jul 26, 2021  9:22 AM Leward Quan A wrote: ?Medication: carvedilol (COREG) 12.5 MG tablet, furosemide (LASIX) 20 MG tablet, glipiZIDE (GLUCOTROL XL) 2.5 MG 24 hr tablet, lisinopril (ZESTRIL) 40 MG tablet, amLODipine (NORVASC) 10 MG tablet, pantoprazole (PROTONIX) 40 MG tablet  ? ?Has the patient contacted their pharmacy? Yes.   ?(Agent: If no, request that the patient contact the pharmacy for the refill. If patient does not wish to contact the pharmacy document the reason why and proceed with request.) ?(Agent: If yes, when and what did the pharmacy advise?) ? ?Preferred Pharmacy (with phone number or street name): [place]  ?Phone:  [phone] ?Fax:  [phone] ? ? ? ?Has the patient been seen for an appointment in the last year OR does the patient have an upcoming appointment? Yes.   ? ?Agent: Please be advised that RX refills may take up to 3 business days. We ask that you follow-up with your pharmacy. ?

## 2021-07-26 NOTE — Telephone Encounter (Signed)
Requested Prescriptions  Pending Prescriptions Disp Refills   carvedilol (COREG) 12.5 MG tablet 90 tablet 0    Sig: Take 1.5 tablets (18.75 mg total) by mouth 2 (two) times daily with a meal.     Cardiovascular: Beta Blockers 3 Failed - 07/26/2021 10:27 AM      Failed - Cr in normal range and within 360 days    Creat  Date Value Ref Range Status  07/11/2016 1.21 (H) 0.50 - 1.10 mg/dL Final   Creatinine, Ser  Date Value Ref Range Status  11/01/2019 1.64 (H) 0.44 - 1.00 mg/dL Final   Creatinine, POC  Date Value Ref Range Status  01/08/2017 100 mg/dL Final   Creatinine, Urine  Date Value Ref Range Status  04/13/2015 34 20 - 320 mg/dL Final         Failed - AST in normal range and within 360 days    AST  Date Value Ref Range Status  06/05/2019 16 15 - 41 U/L Final         Failed - ALT in normal range and within 360 days    ALT  Date Value Ref Range Status  06/05/2019 15 0 - 44 U/L Final         Failed - Last BP in normal range    BP Readings from Last 1 Encounters:  11/29/20 (!) 192/85         Failed - Valid encounter within last 6 months    Recent Outpatient Visits          8 months ago Primary hypertension   [place], Vernia Buff, NP   11 months ago Pure hypercholesterolemia   [place], [place], [place]   1 year ago Heel spur, right   [place], Neoma Laming B, MD   2 years ago Diabetes mellitus type 2 in obese [facility])   [place], [place], [place]   2 years ago Diabetes mellitus type 2 in obese [facility])   [place] Elsie Stain, MD      Future Appointments            In 2 weeks Ladell Pier, MD [place]           Passed - Last Heart Rate in normal range    Pulse Readings from Last 1 Encounters:  11/29/20 80          furosemide  (LASIX) 20 MG tablet 40 tablet 0    Sig: Take 1 tablet (20 mg total) by mouth 2 (two) times daily. Must keep appt 06/21/2021 for further refills     Cardiovascular:  Diuretics - Loop Failed - 07/26/2021 10:27 AM      Failed - K in normal range and within 180 days    Potassium  Date Value Ref Range Status  11/01/2019 3.6 3.5 - 5.1 mmol/L Final         Failed - Ca in normal range and within 180 days    Calcium  Date Value Ref Range Status  11/01/2019 8.4 (L) 8.9 - 10.3 mg/dL Final         Failed - Na in normal range and within 180 days    Sodium  Date Value Ref Range Status  11/01/2019 139 135 - 145 mmol/L Final  07/03/2018 144 134 - 144 mmol/L Final  Failed - Cr in normal range and within 180 days    Creat  Date Value Ref Range Status  07/11/2016 1.21 (H) 0.50 - 1.10 mg/dL Final   Creatinine, Ser  Date Value Ref Range Status  11/01/2019 1.64 (H) 0.44 - 1.00 mg/dL Final   Creatinine, POC  Date Value Ref Range Status  01/08/2017 100 mg/dL Final   Creatinine, Urine  Date Value Ref Range Status  04/13/2015 34 20 - 320 mg/dL Final         Failed - Cl in normal range and within 180 days    Chloride  Date Value Ref Range Status  11/01/2019 105 98 - 111 mmol/L Final         Failed - Mg Level in normal range and within 180 days    No results found for: MG       Failed - Last BP in normal range    BP Readings from Last 1 Encounters:  11/29/20 (!) 192/85         Failed - Valid encounter within last 6 months    Recent Outpatient Visits          8 months ago Primary hypertension   [place], Vernia Buff, NP   11 months ago Pure hypercholesterolemia   [place], [place], [place]   1 year ago Heel spur, right   [place], Neoma Laming B, MD   2 years ago Diabetes mellitus type 2 in obese [facility])   [place], [place],  [place]   2 years ago Diabetes mellitus type 2 in obese [facility])   [place] Elsie Stain, MD      Future Appointments            In 2 weeks Ladell Pier, MD [place]            glipiZIDE (GLUCOTROL XL) 2.5 MG 24 hr tablet 30 tablet 0    Sig: Take 1 tablet (2.5 mg total) by mouth daily with breakfast.     Endocrinology:  Diabetes - Sulfonylureas Failed - 07/26/2021 10:27 AM      Failed - HBA1C is between 0 and 7.9 and within 180 days    Hemoglobin A1C  Date Value Ref Range Status  07/03/2018 6.6 (A) 4.0 - 5.6 % Final         Failed - Cr in normal range and within 360 days    Creat  Date Value Ref Range Status  07/11/2016 1.21 (H) 0.50 - 1.10 mg/dL Final   Creatinine, Ser  Date Value Ref Range Status  11/01/2019 1.64 (H) 0.44 - 1.00 mg/dL Final   Creatinine, POC  Date Value Ref Range Status  01/08/2017 100 mg/dL Final   Creatinine, Urine  Date Value Ref Range Status  04/13/2015 34 20 - 320 mg/dL Final         Failed - Valid encounter within last 6 months    Recent Outpatient Visits          8 months ago Primary hypertension   [place], Vernia Buff, NP   11 months ago Pure hypercholesterolemia   [place], [place]   1 year ago Heel spur, right   [place], [place] B,  MD   2 years ago Diabetes mellitus type 2 in obese [facility])   [place], [place], [place]   2 years ago Diabetes mellitus type 2 in obese [facility])   [place], MD      Future Appointments            In 2 weeks Ladell Pier, MD [place]            amLODipine (NORVASC) 10 MG tablet 30 tablet 0    Sig: Take 1 tablet (10 mg total) by mouth daily.     Cardiovascular: Calcium Channel  Blockers 2 Failed - 07/26/2021 10:27 AM      Failed - Last BP in normal range    BP Readings from Last 1 Encounters:  11/29/20 (!) 192/85         Failed - Valid encounter within last 6 months    Recent Outpatient Visits          8 months ago Primary hypertension   [place], Vernia Buff, NP   11 months ago Pure hypercholesterolemia   [place], [place], [place]   1 year ago Heel spur, right   [place], Deborah B, MD   2 years ago Diabetes mellitus type 2 in obese [facility])   [place], [place], [place]   2 years ago Diabetes mellitus type 2 in obese [facility]   [place] Elsie Stain, MD      Future Appointments            In 2 weeks Ladell Pier, MD [place] - Last Heart Rate in normal range    Pulse Readings from Last 1 Encounters:  11/29/20 80          lisinopril (ZESTRIL) 40 MG tablet 20 tablet 0    Sig: Take 1 tablet (40 mg total) by mouth daily. Must keep appt 06/21/2021 for further refills     Cardiovascular:  ACE Inhibitors Failed - 07/26/2021 10:27 AM      Failed - Cr in normal range and within 180 days    Creat  Date Value Ref Range Status  07/11/2016 1.21 (H) 0.50 - 1.10 mg/dL Final   Creatinine, Ser  Date Value Ref Range Status  11/01/2019 1.64 (H) 0.44 - 1.00 mg/dL Final   Creatinine, POC  Date Value Ref Range Status  01/08/2017 100 mg/dL Final   Creatinine, Urine  Date Value Ref Range Status  04/13/2015 34 20 - 320 mg/dL Final         Failed - K in normal range and within 180 days    Potassium  Date Value Ref Range Status  11/01/2019 3.6 3.5 - 5.1 mmol/L Final         Failed - Last BP in normal range    BP Readings from Last 1 Encounters:  11/29/20 (!) 192/85         Failed - Valid encounter within last 6  months    Recent Outpatient Visits          8 months ago Primary hypertension   [place], Vernia Buff, NP   11  months ago Pure hypercholesterolemia   [place], [place], [place]   1 year ago Heel spur, right   [place], MD   2 years ago Diabetes mellitus type 2 in obese [facility]   [place], [place], [place]   2 years ago Diabetes mellitus type 2 in obese [facility])   [place] Elsie Stain, MD      Future Appointments            In 2 weeks Ladell Pier, MD [place] - Patient is not pregnant       pantoprazole (PROTONIX) 40 MG tablet 30 tablet 3    Sig: Take 1 tablet (40 mg total) by mouth daily.     Gastroenterology: Proton Pump Inhibitors Passed - 07/26/2021 10:27 AM      Passed - Valid encounter within last 12 months    Recent Outpatient Visits          8 months ago Primary hypertension   [place], Vernia Buff, NP   11 months ago Pure hypercholesterolemia   [place], [place], [place]   1 year ago Heel spur, right   [place], MD   2 years ago Diabetes mellitus type 2 in obese [facility])   [place], [place], [place]   2 years ago Diabetes mellitus type 2 in obese [facility])   [place], MD      Future Appointments            In 2 weeks Ladell Pier, MD [place]

## 2021-08-02 ENCOUNTER — Other Ambulatory Visit: Payer: Self-pay | Admitting: Internal Medicine

## 2021-08-02 DIAGNOSIS — E039 Hypothyroidism, unspecified: Secondary | ICD-10-CM

## 2021-08-02 DIAGNOSIS — E7800 Pure hypercholesterolemia, unspecified: Secondary | ICD-10-CM

## 2021-08-02 DIAGNOSIS — I10 Essential (primary) hypertension: Secondary | ICD-10-CM

## 2021-08-02 NOTE — Telephone Encounter (Signed)
Requested medication (s) are due for refill today: yes ? ?Requested medication (s) are on the active medication list: yes ? ?Last refill:  glucotrol- 07/08/21 #30 0 refills, lisinopril- 06/01/21 #20 0 refills  ? ?Future visit scheduled: yes in 1 week ? ?Notes to clinic:  do you want to refill a 2nd courtesy refill #10 0 refills? Last labs 07/03/18 ? ? ?  ?Requested Prescriptions  ?Pending Prescriptions Disp Refills  ? glipiZIDE (GLUCOTROL XL) 2.5 MG 24 hr tablet [Pharmacy Med Name: glipiZIDE ER 2.5 MG Oral Tablet Extended Release 24 Hour] 30 tablet 0  ?  Sig: Take 1 tablet by mouth once daily with breakfast  ?  ? Endocrinology:  Diabetes - Sulfonylureas Failed - 08/02/2021  2:55 PM  ?  ?  Failed - HBA1C is between 0 and 7.9 and within 180 days  ?  Hemoglobin A1C  ?Date Value Ref Range Status  ?07/03/2018 6.6 (A) 4.0 - 5.6 % Final  ?  ?  ?  ?  Failed - Cr in normal range and within 360 days  ?  Creat  ?Date Value Ref Range Status  ?07/11/2016 1.21 (H) 0.50 - 1.10 mg/dL Final  ? ?Creatinine, Ser  ?Date Value Ref Range Status  ?11/01/2019 1.64 (H) 0.44 - 1.00 mg/dL Final  ? ?Creatinine, POC  ?Date Value Ref Range Status  ?01/08/2017 100 mg/dL Final  ? ?Creatinine, Urine  ?Date Value Ref Range Status  ?04/13/2015 34 20 - 320 mg/dL Final  ?  ?  ?  ?  Failed - Valid encounter within last 6 months  ?  Recent Outpatient Visits   ? ?      ? 8 months ago Primary hypertension  ? [place], [place] W, NP  ? 11 months ago Pure hypercholesterolemia  ? [place], [place], [place]  ? 1 year ago Heel spur, right  ? [place] Karle Plumber B, MD  ? 2 years ago Hordeolum externum of left lower eyelid  ? [facility], Bayard Beaver, MD  ? 2 years ago Diabetes mellitus type 2 in obese [facility])  ? [place], [place], [place]  ? ?  ?  ?Future Appointments   ? ?        ?  In 1 week Ladell Pier, MD [place]  ? ?  ? ?  ?  ?  ? lisinopril (ZESTRIL) 40 MG tablet [Pharmacy Med Name: Lisinopril 40 MG Oral Tablet] 20 tablet 0  ?  Sig: TAKE 1 TABLET BY MOUTH ONCE DAILY . APPOINTMENT REQUIRED FOR FUTURE REFILLS 06/21/21  ?  ? Cardiovascular:  ACE Inhibitors Failed - 08/02/2021  2:55 PM  ?  ?  Failed - Cr in normal range and within 180 days  ?  Creat  ?Date Value Ref Range Status  ?07/11/2016 1.21 (H) 0.50 - 1.10 mg/dL Final  ? ?Creatinine, Ser  ?Date Value Ref Range Status  ?11/01/2019 1.64 (H) 0.44 - 1.00 mg/dL Final  ? ?Creatinine, POC  ?Date Value Ref Range Status  ?01/08/2017 100 mg/dL Final  ? ?Creatinine, Urine  ?Date Value Ref Range Status  ?04/13/2015 34 20 - 320 mg/dL Final  ?  ?  ?  ?  Failed - K in normal range and within 180 days  ?  Potassium  ?Date Value Ref Range Status  ?  11/01/2019 3.6 3.5 - 5.1 mmol/L Final  ?  ?  ?  ?  Failed - Last BP in normal range  ?  BP Readings from Last 1 Encounters:  ?11/29/20 (!) 192/85  ?  ?  ?  ?  Failed - Valid encounter within last 6 months  ?  Recent Outpatient Visits   ? ?      ? 8 months ago Primary hypertension  ? [place], [place] W, NP  ? 11 months ago Pure hypercholesterolemia  ? [place], [place], [place]  ? 1 year ago Heel spur, right  ? [place] Karle Plumber B, MD  ? 2 years ago Hordeolum externum of left lower eyelid  ? [facility], Bayard Beaver, MD  ? 2 years ago Diabetes mellitus type 2 in obese [facility])  ? [place], [place], [place]  ? ?  ?  ?Future Appointments   ? ?        ? In 1 week Ladell Pier, MD [place]  ? ?  ? ?  ?  ?  Passed - Patient is not pregnant  ?  ?  ? ?

## 2021-08-03 ENCOUNTER — Other Ambulatory Visit: Payer: Self-pay | Admitting: Internal Medicine

## 2021-08-03 DIAGNOSIS — I10 Essential (primary) hypertension: Secondary | ICD-10-CM

## 2021-08-03 NOTE — Telephone Encounter (Signed)
Pt called to report that she is completely out of her current supply 

## 2021-08-03 NOTE — Telephone Encounter (Signed)
She also says she needs amLODipine (NORVASC) 10 MG tablet  ?

## 2021-08-03 NOTE — Telephone Encounter (Signed)
Copied from [place] [phone]. Topic: General - Other ?>> Aug 03, 2021  9:29 AM Tessa Lerner A wrote: ?Reason for CRM: The patient would like to speak with a member of staff when possible  ? ?The patient's refill requests have been denied due to not being seen by their PCP but their appointment for 07/15/21 was cancelled by Provider and the patient has been unable to be successfully rescheduled  ? ?The agent was unable to successfully schedule a med follow up at the time of call with patient  ? ?Please contact the patient to further discuss scheduling and rx refills ?

## 2021-08-11 ENCOUNTER — Ambulatory Visit: Payer: Self-pay | Admitting: Internal Medicine

## 2021-08-17 ENCOUNTER — Other Ambulatory Visit: Payer: Self-pay | Admitting: Internal Medicine

## 2021-08-17 DIAGNOSIS — I10 Essential (primary) hypertension: Secondary | ICD-10-CM

## 2021-08-17 MED ORDER — LEVOTHYROXINE SODIUM 50 MCG PO TABS: 50.0000 ug | ORAL_TABLET | Freq: Every day | ORAL | 0 refills | Status: DC

## 2021-08-17 MED ORDER — LISINOPRIL 40 MG PO TABS: 40.0000 mg | ORAL_TABLET | Freq: Every day | ORAL | 0 refills | Status: DC

## 2021-08-17 NOTE — Telephone Encounter (Addendum)
Pt called back, requesting an appointment for a medication refill. Pt stated the appointment that was scheduled for 08/11/2021 was a no show because she went to the old address, was late, and we would not see her. ? ?Pt stated refill requests have been denied due to not being seen by their PCP and she is out of all her medication. ? ?I was unable to successfully schedule a med follow up at the time of call with patient   ? ? ?Please contact the patient to further discuss scheduling and rx refills ?

## 2021-08-17 NOTE — Addendum Note (Signed)
Addended by: Daisy Blossom, Annie Main L on: 08/17/2021 09:03 AM ? ? Modules accepted: Orders ? ?

## 2021-08-17 NOTE — Telephone Encounter (Signed)
I am unable to approve these as the patient has multiple no-shows recently. Most recent labs are from an outside system and per [facility], her renal function is poor (03/2021). Will route these requests to PCP to approve if appropriate. Also including PCP's nurse in the call to see if pt is able to schedule a follow-up appointment.  ?

## 2021-09-15 ENCOUNTER — Other Ambulatory Visit: Payer: Self-pay | Admitting: Internal Medicine

## 2021-09-15 DIAGNOSIS — I10 Essential (primary) hypertension: Secondary | ICD-10-CM

## 2021-09-15 DIAGNOSIS — E039 Hypothyroidism, unspecified: Secondary | ICD-10-CM

## 2021-09-21 ENCOUNTER — Other Ambulatory Visit: Payer: Self-pay | Admitting: Internal Medicine

## 2021-09-21 DIAGNOSIS — I10 Essential (primary) hypertension: Secondary | ICD-10-CM

## 2021-09-21 DIAGNOSIS — E039 Hypothyroidism, unspecified: Secondary | ICD-10-CM

## 2021-09-26 ENCOUNTER — Ambulatory Visit: Payer: No Typology Code available for payment source | Attending: Nurse Practitioner | Admitting: Nurse Practitioner

## 2021-09-26 ENCOUNTER — Encounter: Payer: Self-pay | Admitting: Nurse Practitioner

## 2021-09-26 VITALS — BP 171/102 | HR 84 | Temp 98.5°F | Wt 189.0 lb

## 2021-09-26 DIAGNOSIS — E039 Hypothyroidism, unspecified: Secondary | ICD-10-CM

## 2021-09-26 DIAGNOSIS — E7800 Pure hypercholesterolemia, unspecified: Secondary | ICD-10-CM

## 2021-09-26 DIAGNOSIS — K219 Gastro-esophageal reflux disease without esophagitis: Secondary | ICD-10-CM

## 2021-09-26 DIAGNOSIS — M545 Low back pain, unspecified: Secondary | ICD-10-CM

## 2021-09-26 DIAGNOSIS — I428 Other cardiomyopathies: Secondary | ICD-10-CM

## 2021-09-26 DIAGNOSIS — E1165 Type 2 diabetes mellitus with hyperglycemia: Secondary | ICD-10-CM

## 2021-09-26 DIAGNOSIS — G8929 Other chronic pain: Secondary | ICD-10-CM

## 2021-09-26 DIAGNOSIS — Z1159 Encounter for screening for other viral diseases: Secondary | ICD-10-CM

## 2021-09-26 DIAGNOSIS — D649 Anemia, unspecified: Secondary | ICD-10-CM

## 2021-09-26 DIAGNOSIS — Z1231 Encounter for screening mammogram for malignant neoplasm of breast: Secondary | ICD-10-CM

## 2021-09-26 DIAGNOSIS — Z91199 Patient's noncompliance with other medical treatment and regimen due to unspecified reason: Secondary | ICD-10-CM | POA: Diagnosis not present

## 2021-09-26 DIAGNOSIS — I10 Essential (primary) hypertension: Secondary | ICD-10-CM

## 2021-09-26 MED ORDER — GLIPIZIDE ER 5 MG PO TB24: 5.0000 mg | ORAL_TABLET | Freq: Every day | ORAL | 1 refills | Status: DC

## 2021-09-26 MED ORDER — AMLODIPINE BESYLATE 10 MG PO TABS: 10.0000 mg | ORAL_TABLET | Freq: Every day | ORAL | 1 refills | Status: DC

## 2021-09-26 MED ORDER — LEVOTHYROXINE SODIUM 50 MCG PO TABS: 50.0000 ug | ORAL_TABLET | Freq: Every day | ORAL | 1 refills | Status: DC

## 2021-09-26 MED ORDER — FUROSEMIDE 20 MG PO TABS: 20.0000 mg | ORAL_TABLET | Freq: Two times a day (BID) | ORAL | 1 refills | Status: DC

## 2021-09-26 MED ORDER — ATORVASTATIN CALCIUM 10 MG PO TABS: 10.0000 mg | ORAL_TABLET | Freq: Every day | ORAL | 1 refills | Status: DC

## 2021-09-26 MED ORDER — METFORMIN HCL 1000 MG PO TABS: 1000.0000 mg | ORAL_TABLET | Freq: Two times a day (BID) | ORAL | 1 refills | Status: DC

## 2021-09-26 MED ORDER — PANTOPRAZOLE SODIUM 40 MG PO TBEC: 40.0000 mg | DELAYED_RELEASE_TABLET | Freq: Every day | ORAL | 1 refills | Status: DC

## 2021-09-26 MED ORDER — CYCLOBENZAPRINE HCL 10 MG PO TABS: 10.0000 mg | ORAL_TABLET | Freq: Every day | ORAL | 0 refills | Status: DC

## 2021-09-26 MED ORDER — CARVEDILOL 12.5 MG PO TABS: ORAL_TABLET | ORAL | 0 refills | Status: DC

## 2021-09-26 MED ORDER — LISINOPRIL 40 MG PO TABS: 40.0000 mg | ORAL_TABLET | Freq: Every day | ORAL | 0 refills | Status: DC

## 2021-09-26 NOTE — Progress Notes (Signed)
? ?Assessment & Plan:  ?Linda Phelps was seen today for medication refill. ? ?Diagnoses and all orders for this visit: ? ?Essential hypertension ?-     CMP14+EGFR ?-     amLODipine (NORVASC) 10 MG tablet; Take 1 tablet (10 mg total) by mouth daily. ?-     carvedilol (COREG) 12.5 MG tablet; TAKE 1 & 1/2 (ONE & ONE-HALF) TABLETS BY MOUTH TWICE DAILY WITH A MEAL ?-     furosemide (LASIX) 20 MG tablet; Take 1 tablet (20 mg total) by mouth 2 (two) times daily. ?-     lisinopril (ZESTRIL) 40 MG tablet; Take 1 tablet (40 mg total) by mouth daily. ? ?History of noncompliance with medical treatment ?NEEDS TO FOLLOW UP WITH CARDIOLOGY: SHE IS AWARE ? ?Type 2 diabetes mellitus with hyperglycemia, without long-term current use of insulin (Linda Phelps) ?-     CMP14+EGFR ?-     Ambulatory referral to Ophthalmology ?-     glipiZIDE (GLUCOTROL XL) 5 MG 24 hr tablet; Take 1 tablet (5 mg total) by mouth daily with breakfast. ?-     metFORMIN (GLUCOPHAGE) 1000 MG tablet; Take 1 tablet (1,000 mg total) by mouth 2 (two) times daily with a meal. ?-     Hemoglobin A1c ? ?Pure hypercholesterolemia ?-     Lipid panel ?-     atorvastatin (LIPITOR) 10 MG tablet; Take 1 tablet (10 mg total) by mouth daily. ? ?Hypothyroidism, unspecified type ?-     Thyroid Panel With TSH ?-     levothyroxine (SYNTHROID) 50 MCG tablet; Take 1 tablet (50 mcg total) by mouth daily. ? ?Chronic low back pain, unspecified back pain laterality, unspecified whether sciatica present ?-     cyclobenzaprine (FLEXERIL) 10 MG tablet; Take 1 tablet (10 mg total) by mouth at bedtime. ? ?Gastroesophageal reflux disease without esophagitis ?-     pantoprazole (PROTONIX) 40 MG tablet; Take 1 tablet (40 mg total) by mouth daily. ? ?Nonischemic cardiomyopathy (Linda Phelps) ?FOLLOW UP WITH CARDIOLOGY AS INSTRUCTED ? ?Breast cancer screening by mammogram ?-     MS DIGITAL SCREENING TOMO BILATERAL; Future ? ?Need for hepatitis C screening test ?-     HCV Ab w Reflex to Quant PCR ? ?Anemia, unspecified  type ?-     CBC with Differential ? ? ? ?Patient has been counseled on age-appropriate routine health concerns for screening and prevention. These are reviewed and up-to-date. Referrals have been placed accordingly. Immunizations are up-to-date or declined.    ?Subjective:  ? ?Chief Complaint  ?Patient presents with  ? Medication Refill  ? ?HPI ?Linda Phelps 51 y.o. female presents to office today for medication refills. She switched providers 04-29-2019 as she was dissatisfied with my care. She has a history of noncompliance with medication and treatment regimen. She no showed for her appointment on 08-11-2021 with her PCP.  ? 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  ? ? ? ?Hypertension ?She is not exercising and is not adherent to low salt diet. ?Works third shift 7 days a week. She does not have a blood pressure log  today.  She  is currently prescribed amlodipine 10 mg daily, carvedilol 1.5 tablets twice daily, furosemide 20 mg twice daily, lisinopril 40 mg daily ?Blood pressure is not well controlled at home.  ?Cardiac symptoms none. Patient denies chest pain, dyspnea, palpitations, and syncope.  Cardiovascular risk factors: diabetes mellitus, dyslipidemia, hypertension, and obesity (BMI >= 30 kg/m2). Use of agents associated with hypertension: none.  ?History of target organ damage: heart failure. ?BP Readings  from Last 3 Encounters:  ?09/26/21 (!) 171/102  ?11/29/20 (!) 192/85  ?11/01/19 (!) 169/113  ? ? ?Hyperlipidemia ?Patient presents for follow up to hyperlipidemia.  She is not medication compliant. She is not diet compliant and denies chest pain and dyspnea or statin intolerance including myalgias.  ?Lab Results  ?Component Value Date  ? CHOL 200 (H) 06/16/2019  ? ?Lab Results  ?Component Value Date  ? HDL 57 06/16/2019  ? ?Lab Results  ?Component Value Date  ? LDLCALC 111 (H) 06/16/2019  ? ?Lab Results  ?Component Value Date  ? TRIG 182 (H) 06/16/2019  ? ?Lab Results  ?Component Value Date  ? CHOLHDL 3.5 06/16/2019  ? ? ? ? ?Diabetes Mellitus Type II ?Current symptoms/problems include hyperglycemia and have been worsening.  ?Known diabetic complications: none ?Current diabetic medications include: Glipizide XL 2.5 mg daily and metformin 1000 mg twice daily ?Eye exam current (within one year): yes ?Weight trend: stable ?Prior visit with dietician: no ?Current monitoring regimen: office lab tests - nonadherent with office visit appointments ?Home blood sugar records:  infrequently  ?Any episodes of hypoglycemia? no ?Is She on ACE inhibitor or angiotensin II receptor blocker?  ?Yes  ?Lab Results  ?Component Value Date  ? HGBA1C 6.6 (A) 07/03/2018  ? HGBA1C 7.3 (A) 10/17/2017  ? HGBA1C 6.3 05/09/2017  ? ? ? ? ?Review of Systems  ?Constitutional:  Negative for fever, malaise/fatigue and weight loss.  ?HENT: Negative.  Negative for nosebleeds.   ?Eyes: Negative.  Negative for blurred vision, double vision and photophobia.  ?Respiratory: Negative.  Negative for cough and shortness of breath.   ?Cardiovascular: Negative.  Negative for chest pain, palpitations and leg swelling.  ?Gastrointestinal: Negative.  Negative for heartburn, nausea and vomiting.  ?Musculoskeletal: Negative.  Negative for myalgias.  ?Neurological: Negative.  Negative for dizziness, focal weakness, seizures and headaches.  ?Psychiatric/Behavioral:  Negative.  Negative for suicidal ideas.   ? ?Past Medical History:  ?Diagnosis Date  ? Atypical chest pain 11/18/2017  ? Cardiomegaly 09/23/2014  ? Cardiomyopathy, peripartum, delivered 09/21/2015  ? CHF (congestive heart failure) ([place])   ? Chronic right-sided low back pain without sciatica 07/11/2016  ? Chronic systolic heart failure ([place]) 09/21/2015  ? Diabetes mellitus type 2 in obese ([place]) 07/03/2014  ? Diabetes mellitus without complication ([place])   ? Ectopic pregnancy   ? Essential hypertension 07/03/2014  ? Generalized anxiety disorder 07/26/2015  ? GERD (gastroesophageal reflux disease) 07/26/2015  ? High cholesterol   ? History of noncompliance with medical treatment 09/21/2015  ? Overview:  Per cardiologist notes from [place] of this note might be different from the original. Per cardiologist notes from [facility]  ? Hordeolum externum (stye) 01/11/2016  ? Hyperlipidemia 07/03/2014  ? Hypertension   ? Hypothyroidism 07/03/2014  ? Long-term use of aspirin therapy 09/21/2015  ? Low back pain   ? Menstrual cycle problem 01/11/2016  ? Obesity   ? Obesity (BMI 30.0-34.9) 10/01/2015  ? Palpitations 07/26/2015  ? Poor dentition 04/13/2015  ? Thyroid disease   ?  Vitamin D insufficiency 01/13/2016  ? ? ?Past Surgical History:  ?Procedure Laterality Date  ? APPENDECTOMY    ? CHOLECYSTECTOMY    ? ? ?Family History  ?Problem Relation Age of Onset  ? Diabetes Mother   ? Diabetes Maternal Grandmother   ? Colon cancer Maternal Uncle 68  ? ? ?Social History Reviewed with no changes to be made today.  ? ?Outpatient Medications Prior to Visit  ?Medication Sig Dispense Refill  ? aspirin 81 MG tablet Take 1 tablet (81 mg total) by mouth daily. 90 tablet 6  ? Blood Glucose Monitoring Suppl (TRUE METRIX METER) w/Device KIT Use to check blood sugar 3 times daily. 1 kit 0  ? glucose blood (TRUE METRIX BLOOD GLUCOSE TEST) test strip Use to check blood sugar 3 times daily. 100 each 3  ? TRUEplus Lancets 28G MISC Use to check  blood sugar 3 times daily. 100 each 3  ? amLODipine (NORVASC) 10 MG tablet Take 1 tablet (10 mg total) by mouth daily. 30 tablet 0  ? atorvastatin (LIPITOR) 10 MG tablet Take 1 tablet by mouth once daily 90 tab
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
## 2021-09-27 LAB — CBC WITH DIFFERENTIAL/PLATELET
Basophils Absolute: 0 10*3/uL (ref 0.0–0.2)
Basos: 1 %
EOS (ABSOLUTE): 0.1 10*3/uL (ref 0.0–0.4)
Eos: 2 %
Hematocrit: 38.1 % (ref 34.0–46.6)
Hemoglobin: 12.4 g/dL (ref 11.1–15.9)
Immature Grans (Abs): 0 10*3/uL (ref 0.0–0.1)
Immature Granulocytes: 0 %
Lymphocytes Absolute: 2.5 10*3/uL (ref 0.7–3.1)
Lymphs: 41 %
MCH: 29.2 pg (ref 26.6–33.0)
MCHC: 32.5 g/dL (ref 31.5–35.7)
MCV: 90 fL (ref 79–97)
Monocytes Absolute: 0.4 10*3/uL (ref 0.1–0.9)
Monocytes: 7 %
Neutrophils Absolute: 3 10*3/uL (ref 1.4–7.0)
Neutrophils: 49 %
Platelets: 313 10*3/uL (ref 150–450)
RBC: 4.24 x10E6/uL (ref 3.77–5.28)
RDW: 12.8 % (ref 11.7–15.4)
WBC: 6.2 10*3/uL (ref 3.4–10.8)

## 2021-09-27 LAB — CMP14+EGFR
ALT: 8 IU/L (ref 0–32)
AST: 9 IU/L (ref 0–40)
Albumin/Globulin Ratio: 1.2 (ref 1.2–2.2)
Albumin: 4.1 g/dL (ref 3.8–4.9)
Alkaline Phosphatase: 86 IU/L (ref 44–121)
BUN/Creatinine Ratio: 12 (ref 9–23)
BUN: 32 mg/dL — ABNORMAL HIGH (ref 6–24)
Bilirubin Total: 0.3 mg/dL (ref 0.0–1.2)
CO2: 22 mmol/L (ref 20–29)
Calcium: 9.6 mg/dL (ref 8.7–10.2)
Chloride: 105 mmol/L (ref 96–106)
Creatinine, Ser: 2.73 mg/dL — ABNORMAL HIGH (ref 0.57–1.00)
Globulin, Total: 3.3 g/dL (ref 1.5–4.5)
Glucose: 126 mg/dL — ABNORMAL HIGH (ref 70–99)
Potassium: 4.6 mmol/L (ref 3.5–5.2)
Sodium: 143 mmol/L (ref 134–144)
Total Protein: 7.4 g/dL (ref 6.0–8.5)
eGFR: 20 mL/min/{1.73_m2} — ABNORMAL LOW (ref >59)

## 2021-09-27 LAB — THYROID PANEL WITH TSH
Free Thyroxine Index: 1.9 (ref 1.2–4.9)
T3 Uptake Ratio: 27 % (ref 24–39)
T4, Total: 7.2 ug/dL (ref 4.5–12.0)
TSH: 3.6 u[IU]/mL (ref 0.450–4.500)

## 2021-09-27 LAB — HCV INTERPRETATION

## 2021-09-27 LAB — LIPID PANEL
Chol/HDL Ratio: 3.7 ratio (ref 0.0–4.4)
Cholesterol, Total: 206 mg/dL — ABNORMAL HIGH (ref 100–199)
HDL: 55 mg/dL (ref >39)
LDL Chol Calc (NIH): 116 mg/dL — ABNORMAL HIGH (ref 0–99)
Triglycerides: 199 mg/dL — ABNORMAL HIGH (ref 0–149)
VLDL Cholesterol Cal: 35 mg/dL (ref 5–40)

## 2021-09-27 LAB — HEMOGLOBIN A1C
Est. average glucose Bld gHb Est-mCnc: 146 mg/dL
Hgb A1c MFr Bld: 6.7 % — ABNORMAL HIGH (ref 4.8–5.6)

## 2021-09-27 LAB — HCV AB W REFLEX TO QUANT PCR: HCV Ab: NONREACTIVE

## 2021-09-28 ENCOUNTER — Other Ambulatory Visit: Payer: Self-pay | Admitting: Nurse Practitioner

## 2021-09-28 DIAGNOSIS — E1165 Type 2 diabetes mellitus with hyperglycemia: Secondary | ICD-10-CM

## 2021-09-28 DIAGNOSIS — R7989 Other specified abnormal findings of blood chemistry: Secondary | ICD-10-CM

## 2021-09-28 MED ORDER — METFORMIN HCL 1000 MG PO TABS: 500.0000 mg | ORAL_TABLET | Freq: Two times a day (BID) | ORAL | 0 refills | Status: DC

## 2021-10-03 ENCOUNTER — Other Ambulatory Visit: Payer: Self-pay | Admitting: *Deleted

## 2021-10-03 ENCOUNTER — Ambulatory Visit: Payer: Self-pay | Admitting: *Deleted

## 2021-10-03 DIAGNOSIS — I10 Essential (primary) hypertension: Secondary | ICD-10-CM

## 2021-10-03 MED ORDER — LISINOPRIL 40 MG PO TABS: 40.0000 mg | ORAL_TABLET | Freq: Every day | ORAL | 0 refills | Status: DC

## 2021-10-03 NOTE — Telephone Encounter (Signed)
?  Chief Complaint: patient states she was in office recently and has skin irritation on left arm- eczema- provider advised her she would send in medication- patient is requesting that medication- not noted in chart- request sent to office ?Symptoms: itching-arm ?Frequency: chronic ?Pertinent Negatives: Patient denies   ?Disposition: [] ED /[] Urgent Care (no appt availability in office) / [] Appointment(In office/virtual)/ []  [place] [facility]/ [] Home Care/ [] Refused Recommended Disposition /[] [place] [facility]/ []  Follow-up with PCP ?Additional Notes: Patient requesting eczema medication discussed at appointment. Needs lisinopril filled. Needs lab ordered- she has scheduled the appointment for next Monday . ?

## 2021-10-03 NOTE — Telephone Encounter (Signed)
Summary: skin irritation / rx request / rx concern  ? The patient has called to request a prescription for a cream for their skin irritation  ? ?The patient's skin irritation has occurred for more than 1 week  ? ?The patient would also like to discuss the amount of refills given for their lisinopril (ZESTRIL) 40 MG tablet [[id number]]  prescription  ? ?Please contact further when possible   ?  ? ?Reason for Disposition ?? Prescription request for new medicine (not a refill) ? ?Answer Assessment - Initial Assessment Questions ?1. DESCRIPTION: "Describe the itching you are having." "Where is it located?" ?    Left arm- change in the weather ?2. SEVERITY: "How bad is it?"  ?  - MILD - doesn't interfere with normal activities ?  - MODERATE-SEVERE: interferes with work, school, sleep, or other activities  ?    moderate ?3. SCRATCHING: "Are there any scratch marks? Bleeding?" ?    yes ?4. ONSET: "When did the itching begin?"  ?    4 weeks- eczema  ?5. CAUSE: "What do you think is causing the itching?"  ?    eczema ? ?Answer Assessment - Initial Assessment Questions ?1. DRUG NAME: "What medicine do you need to have refilled?" ?    Lisinopril, new Rx eczema as discussed at appointment ?2. REFILLS REMAINING: "How many refills are remaining?" (Note: The label on the medicine or pill bottle will show how many refills are remaining. If there are no refills remaining, then a renewal may be needed.) ?    none ?3. EXPIRATION DATE: "What is the expiration date?" (Note: The label states when the prescription will expire, and thus can no longer be refilled.) ?      ?4. PRESCRIBING HCP: "Who prescribed it?" Reason: If prescribed by specialist, call should be referred to that group. ?    PCP ? ?Protocols used: Itching - Localized-A-AH, Medication Refill and Renewal Call-A-AH ? ?

## 2021-10-03 NOTE — Telephone Encounter (Signed)
Call to pharmacy- they did not receive this Rx- but report patient does have RF- Rx resent ?Requested Prescriptions  ?Pending Prescriptions Disp Refills  ?? lisinopril (ZESTRIL) 40 MG tablet 30 tablet 0  ?  Sig: Take 1 tablet (40 mg total) by mouth daily.  ?  ? Cardiovascular:  ACE Inhibitors Failed - 10/03/2021  3:36 PM  ?  ?  Failed - Cr in normal range and within 180 days  ?  Creat  ?Date Value Ref Range Status  ?07/11/2016 1.21 (H) 0.50 - 1.10 mg/dL Final  ? ?Creatinine, Ser  ?Date Value Ref Range Status  ?09/26/2021 2.73 (H) 0.57 - 1.00 mg/dL Final  ? ?Creatinine, POC  ?Date Value Ref Range Status  ?01/08/2017 100 mg/dL Final  ? ?Creatinine, Urine  ?Date Value Ref Range Status  ?04/13/2015 34 20 - 320 mg/dL Final  ?   ?  ?  Failed - Last BP in normal range  ?  BP Readings from Last 1 Encounters:  ?09/26/21 (!) 171/102  ?   ?  ?  Passed - K in normal range and within 180 days  ?  Potassium  ?Date Value Ref Range Status  ?09/26/2021 4.6 3.5 - 5.2 mmol/L Final  ?   ?  ?  Passed - Patient is not pregnant  ?  ?  Passed - Valid encounter within last 6 months  ?  Recent Outpatient Visits   ?      ? 1 week ago Essential hypertension  ? [facility] [place], [place] W, NP  ? 10 months ago Primary hypertension  ? [facility], Vernia Buff, NP  ? 1 year ago Pure hypercholesterolemia  ? [facility], [place], [place]  ? 1 year ago Heel spur, right  ? [place] Karle Plumber B, MD  ? 2 years ago Hordeolum externum of left lower eyelid  ? [facility], Bayard Beaver, MD  ?  ?  ?Future Appointments   ?        ? In 3 months Ladell Pier, MD [place]  ?  ? ?  ?  ?  ? ?

## 2021-10-03 NOTE — Telephone Encounter (Signed)
Patient was seen on 09/26/2021 and is requesting cream for eczema ?

## 2021-10-03 NOTE — Telephone Encounter (Signed)
Patient request RF- was not sent in after appointment ?

## 2021-10-04 ENCOUNTER — Other Ambulatory Visit: Payer: Self-pay | Admitting: Nurse Practitioner

## 2021-10-04 MED ORDER — TRIAMCINOLONE ACETONIDE 0.025 % EX OINT: 1.0000 "application " | TOPICAL_OINTMENT | Freq: Two times a day (BID) | CUTANEOUS | 0 refills | Status: DC

## 2021-10-10 ENCOUNTER — Other Ambulatory Visit: Payer: Medicaid Other

## 2021-10-13 ENCOUNTER — Other Ambulatory Visit: Payer: Medicaid Other

## 2021-10-22 ENCOUNTER — Emergency Department (HOSPITAL_BASED_OUTPATIENT_CLINIC_OR_DEPARTMENT_OTHER)
Admission: EM | Admit: 2021-10-22 | Discharge: 2021-10-22 | Discharge status: Against Medical Advice | Payer: Medicaid Other | Source: Home / Self Care

## 2021-10-22 ENCOUNTER — Other Ambulatory Visit: Payer: Self-pay

## 2021-11-15 ENCOUNTER — Telehealth: Payer: Self-pay | Admitting: Pharmacist

## 2021-11-16 ENCOUNTER — Other Ambulatory Visit: Payer: Self-pay | Admitting: Pharmacist

## 2021-11-16 ENCOUNTER — Telehealth: Payer: Self-pay | Admitting: Internal Medicine

## 2021-11-16 DIAGNOSIS — I10 Essential (primary) hypertension: Secondary | ICD-10-CM

## 2021-11-16 DIAGNOSIS — N184 Chronic kidney disease, stage 4 (severe): Secondary | ICD-10-CM

## 2021-11-16 MED ORDER — BLOOD PRESSURE MONITOR DEVI: 0 refills | Status: DC

## 2021-11-16 NOTE — Telephone Encounter (Signed)
Message from pharmacist noted.  Order placed for chemistry and referral submitted to nephrology.

## 2021-11-16 NOTE — Telephone Encounter (Signed)
-----   Message from Osker Mason, [place] sent at 11/16/2021  1:44 PM EDT ----- Dr. Mena Goes, and Harlene Ramus, when you saw this patient last month you requested a repeat BMP in 2 weeks, as she had a significant decline in renal function. Patient didn't schedule that. When I talked to her today, I explained the need and scheduled a lab appointment next week. Could someone place that BMP order? If still that declined, patient is interested in a referral to nephrology. She's very worried about her kidneys. We talked a lot about avoiding NSAIDs and the importance of optimal management of DM and HTN.   We talked about the potential for therapy optimization (if renal function appropriate, SGLT2) with Lurena Joiner, as she was hoping to see someone sooner, so I've scheduled her with him. Dr. Wynetta Emery, are you OK with her seeing [facility] for DM as well as HTN follow up?  Thanks, all!  Catie Hedwig Morton, PharmD, [facility] [phone]

## 2021-11-16 NOTE — Chronic Care Management (AMB) (Signed)
/  Chief Complaint  Patient presents with   Hypertension    Linda Phelps is a 51 y.o. year old female who presented for a telephone visit.   They were referred to the pharmacist by a quality report for assistance in managing hypertension.   Patient is participating in a Managed Medicaid Plan:  Yes  Subjective:  Care Team: Primary Care Provider: Ladell Pier, MD ; Next Scheduled Visit: 01/17/22, embedded pharmacist 12/23/21  Medication Access/Adherence  Current Pharmacy:  [place], [place] [address] [place] [address] Phone: [phone] Fax: [place], [place] - [address] [place] [address] Phone: [phone] Fax: [phone]   Patient reports affordability concerns with their medications: Yes  - reports that she was unaware she has Medicaid Patient reports access/transportation concerns to their pharmacy: No  Patient reports adherence concerns with their medications:  Yes     Diabetes:  Current medications: metformin 500 mg twice daily, glipizide XL 5 mg daily  Very concerned about whether metformin hurts her kidneys  Heart Failure:  Current medications:  ACEi/ARB/ARNI: lisinopril 40 mg daily SGLT2i: none, consider Beta blocker: carvedilol 18.75 mg twice daily Mineralocorticoid Receptor Antagonist: none Diuretic regimen: furosemide 20 mg twice daily  Current home blood pressure readings: not checking, has a wrist cuff.    Hyperlipidemia/ASCVD Risk Reduction  Current lipid lowering medications: atorvastatin 10 mg daily  Antiplatelet regimen: aspirin 81 mg daily   Health Maintenance  Health Maintenance Due  Topic Date Due   COVID-19 Vaccine (1) Never done   OPHTHALMOLOGY EXAM  Never done   PAP SMEAR-Modifier  Never done   COLONOSCOPY (Pts 45-78yr Insurance coverage will need to be confirmed)  Never done   FOOT  EXAM  01/08/2018   MAMMOGRAM  Never done     Objective: Lab Results  Component Value Date   HGBA1C 6.7 (H) 09/26/2021    Lab Results  Component Value Date   CREATININE 2.73 (H) 09/26/2021   BUN 32 (H) 09/26/2021   NA 143 09/26/2021   K 4.6 09/26/2021   CL 105 09/26/2021   CO2 22 09/26/2021    Lab Results  Component Value Date   CHOL 206 (H) 09/26/2021   HDL 55 09/26/2021   LDLCALC 116 (H) 09/26/2021   TRIG 199 (H) 09/26/2021   CHOLHDL 3.7 09/26/2021    Medications Reviewed Today     Reviewed by HOsker Mason RPH-CPP (Pharmacist) on 11/16/21 at 131 Med List Status: <None>   Medication Order Taking? Sig Documenting Provider Last Dose Status Informant  amLODipine (NORVASC) 10 MG tablet [id number] Take 1 tablet (10 mg total) by mouth daily. FGildardo Pounds NP Taking Active   aspirin 81 MG tablet [id number] Take 1 tablet (81 mg total) by mouth daily. MArgentina Donovan PA-C Taking Active   atorvastatin (LIPITOR) 10 MG tablet [id number] Take 1 tablet (10 mg total) by mouth daily. FGildardo Pounds NP Taking Active   Blood Glucose Monitoring Suppl (TRUE METRIX METER) w/Device KIT [id number] Use to check blood sugar 3 times daily. JLadell Pier MD Taking Active   carvedilol (COREG) 12.5 MG tablet [id number] TAKE 1 & 1/2 (ONE & ONE-HALF) TABLETS BY MOUTH TWICE DAILY WITH A MEAL FGildardo Pounds NP Taking Active   cyclobenzaprine (FLEXERIL) 10 MG tablet [id number] Take 1 tablet (10 mg total)  by mouth at bedtime.  Patient not taking: Reported on 11/16/2021   Gildardo Pounds, NP Not Taking Active   furosemide (LASIX) 20 MG tablet [id number] Yes Take 1 tablet (20 mg total) by mouth 2 (two) times daily. Gildardo Pounds, NP Taking Active   glipiZIDE (GLUCOTROL XL) 5 MG 24 hr tablet [id number] Yes Take 1 tablet (5 mg total) by mouth daily with breakfast. Gildardo Pounds, NP Taking Active   glucose blood (TRUE METRIX BLOOD GLUCOSE TEST) test strip  [id number]  Use to check blood sugar 3 times daily. Ladell Pier, MD  Active   levothyroxine (SYNTHROID) 50 MCG tablet [id number] Yes Take 1 tablet (50 mcg total) by mouth daily. Gildardo Pounds, NP Taking Active   lisinopril (ZESTRIL) 40 MG tablet [id number] Yes Take 1 tablet (40 mg total) by mouth daily. Gildardo Pounds, NP Taking Active   metFORMIN (GLUCOPHAGE) 1000 MG tablet [id number] Yes Take 0.5 tablets (500 mg total) by mouth 2 (two) times daily with a meal. DOSE CHANGE Gildardo Pounds, NP Taking Active   pantoprazole (PROTONIX) 40 MG tablet [id number]  Take 1 tablet (40 mg total) by mouth daily. Gildardo Pounds, NP  Active   triamcinolone (KENALOG) 0.025 % ointment [id number]  Apply 1 application. topically 2 (two) times daily. Gildardo Pounds, NP  Active   TRUEplus Lancets 28G MISC [id number]  Use to check blood sugar 3 times daily. Ladell Pier, MD  Active               Assessment/Plan:   Diabetes: - Currently controlled but suboptimal therapeutic regimen - Reviewed that metformin is not nephrotoxic, the dose just needs to be adjusted based on renal function. Patient was supposed to have repeat BMP after last lab in May. Scheduled patient for lab appointment next week. Will collaborate with Zelda/Dr. Wynetta Emery to place order as Zelda had previously requested.  - Discussed that pending renal function, use of SGLT2 would be beneficial. Scheduled with embedded pharmacist, will collaborate with PCP for referral   Hyperlipidemia/ASCVD Risk Reduction: - Currently uncontrolled.  - Reviewed long term complications of uncontrolled cholesterol - Recommend to take atorvastatin daily as scheduled   Heart Failure: - Currently opportunity for optimization pending renal function - Will collaborate with PCP and NP as above for BMP.  - BP cuff sent to pharmacy. They will deliver.    Follow Up Plan: phone call in 4 weeks  Catie TJodi Mourning, PharmD, [facility] [phone]

## 2021-11-24 ENCOUNTER — Other Ambulatory Visit: Payer: Medicaid Other

## 2021-12-12 ENCOUNTER — Telehealth: Payer: Self-pay | Admitting: Pharmacist

## 2021-12-12 ENCOUNTER — Ambulatory Visit: Payer: Self-pay

## 2021-12-12 NOTE — Telephone Encounter (Signed)
Attempted to call patient for follow up appointment. Left voicemail for her to return my call at her convenience.   Catie Hedwig Morton, PharmD, [facility] [phone]

## 2021-12-15 ENCOUNTER — Ambulatory Visit: Payer: Self-pay

## 2021-12-15 NOTE — Telephone Encounter (Signed)
Attempted to call patient back as scheduled. Unable to leave voicemail.

## 2021-12-15 NOTE — Telephone Encounter (Signed)
Called patient. Scheduled follow up this afternoon.   Catie Hedwig Morton, PharmD, [facility] [phone]

## 2021-12-23 ENCOUNTER — Ambulatory Visit: Payer: Medicaid Other | Admitting: Pharmacist

## 2021-12-26 NOTE — Telephone Encounter (Signed)
Called patient. She reports she had a death in the family and was out of town without cell signal. Rescheduled follow up call on Thursday.

## 2021-12-29 ENCOUNTER — Other Ambulatory Visit: Payer: Self-pay | Admitting: Pharmacist

## 2021-12-29 DIAGNOSIS — I10 Essential (primary) hypertension: Secondary | ICD-10-CM

## 2021-12-29 MED ORDER — LISINOPRIL 40 MG PO TABS: 40.0000 mg | ORAL_TABLET | Freq: Every day | ORAL | 0 refills | Status: DC

## 2021-12-29 NOTE — Chronic Care Management (AMB) (Signed)
Chief Complaint  Patient presents with   Hypertension    Linda Phelps is a 51 y.o. year old female who presented for a telephone visit.   They were referred to the pharmacist by a quality report for assistance in managing hypertension.   Patient is participating in a Managed Medicaid Plan:  Yes  Subjective:  Care Team: Primary Care Provider: Ladell Pier, MD ; Next Scheduled Visit: 01/17/22   Medication Access/Adherence  Current Pharmacy:  [facility], [place] [address] Phone: [phone] Fax: [place], [place] - [address] [place] [address] Phone: [phone] Fax: [phone]   Patient reports affordability concerns with their medications: Yes  - reports she was unaware she had Medicaid, she is still paying cash price at [facility] Patient reports access/transportation concerns to their pharmacy: No  Patient reports adherence concerns with their medications:  Yes  she does not have atorvastatin due to the cost  Has not heard from nephrology referral. Provided phone number to [place] for her to call today.   Also reports concerns with eczema on her left forearm. Reports she used a new fragranced body oil a few months ago that seemed to cause the flare, but has since discontinued this new product and still has the patch on her arm. Reports she has been using triamcinolone cream four times daily with Aveeno unscented lotion in between. She notes that hotter days seem to worsen the eczema. Denies any other changes in soaps, detergents, shampoos, etc.   Diabetes:  Current medications: metformin 500 mg twice daily, glipizide XL 5 mg daily   Patient reports hypoglycemic s/sx including dizziness, shakiness, sweating periodically. Reports she also has a very difficult time losing weight  Heart  Failure:  Current medications:  ACEi/ARB/ARNI: lisinopril 40 mg daily SGLT2i: none; consider moving forward pending renal function Beta blocker: carvedilol 12.5 mg twice daily - prescribed 18.75 mg twice daily but didn't understand the directions Mineralocorticoid Receptor Antagonist: none Diuretic regimen: furosemide 20 mg twice daily - reports significant issues with swelling if she misses this medication  Current home blood pressure readings: 120-140s/80-90s   Patient reports volume overload signs or symptoms including lower extremity edema if missed doses of furosemide.   Hyperlipidemia/ASCVD Risk Reduction  Current lipid lowering medications: atorvastatin 10 mg daily - does not have due to cost.    Antiplatelet regimen: aspirin 81 mg daily    Health Maintenance  Health Maintenance Due  Topic Date Due   COVID-19 Vaccine (1) Never done   OPHTHALMOLOGY EXAM  Never done   PAP SMEAR-Modifier  Never done   COLONOSCOPY (Pts 45-61yr Insurance coverage will need to be confirmed)  Never done   FOOT EXAM  01/08/2018   MAMMOGRAM  Never done   Zoster Vaccines- Shingrix (1 of 2) Never done   INFLUENZA VACCINE  12/20/2021     Objective: Lab Results  Component Value Date   HGBA1C 6.7 (H) 09/26/2021    Lab Results  Component Value Date   CREATININE 2.73 (H) 09/26/2021   BUN 32 (H) 09/26/2021   NA 143 09/26/2021   K 4.6 09/26/2021   CL 105 09/26/2021   CO2 22 09/26/2021    Lab Results  Component Value Date   CHOL 206 (H) 09/26/2021   HDL 55 09/26/2021   LDLCALC 116 (H) 09/26/2021   TRIG 199 (  H) 09/26/2021   CHOLHDL 3.7 09/26/2021    Medications Reviewed Today     Reviewed by Osker Mason, RPH-CPP (Pharmacist) on 12/29/21 at [place] List Status: <None>   Medication Order Taking? Sig Documenting Provider Last Dose Status Informant  amLODipine (NORVASC) 10 MG tablet [id number] Yes Take 1 tablet (10 mg total) by mouth daily. Gildardo Pounds, NP Taking Active    aspirin 81 MG tablet [id number] Yes Take 1 tablet (81 mg total) by mouth daily. Argentina Donovan, PA-C Taking Active   atorvastatin (LIPITOR) 10 MG tablet [id number] No Take 1 tablet (10 mg total) by mouth daily.  Patient not taking: Reported on 12/29/2021   Gildardo Pounds, NP Not Taking Active   Blood Glucose Monitoring Suppl (TRUE METRIX METER) w/Device KIT [id number] Yes Use to check blood sugar 3 times daily. Ladell Pier, MD Taking Active   Blood Pressure Monitor DEVI [id number] Yes Use to check blood pressure daily Ladell Pier, MD Taking Active   carvedilol (COREG) 12.5 MG tablet [id number] Yes TAKE 1 & 1/2 (ONE & ONE-HALF) TABLETS BY MOUTH TWICE DAILY WITH A MEAL Gildardo Pounds, NP Taking Active            Med Note Jodi Mourning, Joey Lierman T   Thu Dec 29, 2021  9:07 AM) 12.5 mg twice daily  cyclobenzaprine (FLEXERIL) 10 MG tablet [id number] No Take 1 tablet (10 mg total) by mouth at bedtime.  Patient not taking: Reported on 11/16/2021   Gildardo Pounds, NP Not Taking Active   furosemide (LASIX) 20 MG tablet [id number] Yes Take 1 tablet (20 mg total) by mouth 2 (two) times daily. Gildardo Pounds, NP Taking Active   glipiZIDE (GLUCOTROL XL) 5 MG 24 hr tablet [id number] Yes Take 1 tablet (5 mg total) by mouth daily with breakfast. Gildardo Pounds, NP Taking Active   glucose blood (TRUE METRIX BLOOD GLUCOSE TEST) test strip [id number] Yes Use to check blood sugar 3 times daily. Ladell Pier, MD Taking Active   levothyroxine (SYNTHROID) 50 MCG tablet [id number] Yes Take 1 tablet (50 mcg total) by mouth daily. Gildardo Pounds, NP Taking Active   lisinopril (ZESTRIL) 40 MG tablet [id number] Yes Take 1 tablet (40 mg total) by mouth daily. Gildardo Pounds, NP Taking Active   metFORMIN (GLUCOPHAGE) 1000 MG tablet [id number] Yes Take 0.5 tablets (500 mg total) by mouth 2 (two) times daily with a meal. DOSE CHANGE Gildardo Pounds, NP Taking Active   pantoprazole (PROTONIX) 40 MG tablet  [id number] No Take 1 tablet (40 mg total) by mouth daily.  Patient not taking: Reported on 12/29/2021   Gildardo Pounds, NP Not Taking Active   triamcinolone (KENALOG) 0.025 % ointment [id number] Yes Apply 1 application. topically 2 (two) times daily. Gildardo Pounds, NP Taking Active            Med Note Jodi Mourning, Shamaine Mulkern T   Thu Dec 29, 2021  9:03 AM) Applying 4 times daily  TRUEplus Lancets 28G MISC [id number] Yes Use to check blood sugar 3 times daily. Ladell Pier, MD Taking Active               Assessment/Plan:   Diabetes: - Currently controlled but with opportunity for therapy optimization - Reviewed long term cardiovascular and renal outcomes of uncontrolled blood sugar - Reviewed goal A1c, goal fasting, and goal 2 hour post prandial glucose - Recommend to discuss GLP1 with PCP and embedded pharmacist to  allow for elimination of sulfonylurea. Pending renal function, may need to discontinue metformin. Rescheduled patient for repeat BMP that had been requested after last office visit. She will come in on Monday for this.   Hypertension: - Currently uncontrolled - Reviewed appropriate blood pressure monitoring technique and reviewed goal blood pressure. Recommended to check home blood pressure and heart rate daily - Recommend to increase carvedilol to 18.75 mg twice daily as previously prescribed.    Hyperlipidemia/ASCVD Risk Reduction: - Currently uncontrolled.  - Called [facility], provided Medicaid billing information. They will fill atorvastatin   Follow Up Plan: phone call in 6 weeks  Catie TJodi Mourning, PharmD, [facility] [phone]

## 2022-01-02 ENCOUNTER — Other Ambulatory Visit: Payer: Medicaid Other

## 2022-01-17 ENCOUNTER — Ambulatory Visit: Payer: Medicaid Other | Admitting: Internal Medicine

## 2022-01-18 ENCOUNTER — Telehealth: Payer: Self-pay | Admitting: Pharmacist

## 2022-01-18 NOTE — Progress Notes (Signed)
Noted that patient missed scheduled BMP a few weeks ago and missed scheduled follow up with PCP yesterday. Attempted to call and discuss, support as needed. Left voicemail for patient to return my call at her convenience.   Catie Hedwig Morton, PharmD, [facility] [phone]

## 2022-01-25 NOTE — Progress Notes (Signed)
Attempted to call patient again to follow up on medication management, hypertension, and need for BMP. Left voicemail.   Catie Hedwig Morton, PharmD, [facility] [phone]

## 2022-01-30 ENCOUNTER — Other Ambulatory Visit: Payer: Self-pay | Admitting: Pharmacist

## 2022-01-30 NOTE — Progress Notes (Signed)
Patient returned call. See other note

## 2022-01-30 NOTE — Progress Notes (Signed)
Attempted to contact patient for scheduled appointment for medication management. Left HIPAA compliant message for patient to return my call at their convenience.   Also called patient's emergency contact and left a message for her to ask patient to return my call.   Catie Hedwig Morton, PharmD, [facility] [phone]

## 2022-01-30 NOTE — Progress Notes (Signed)
Care Coordination Call  Patient returned my call. She does not have a lot of time to talk today, but I reminded her of her upcoming visit with embedded pharmacist on Friday. She notes she has not scheduled a nephrology appointment yet, so I provided her the phone number for [facility] again. She noted she would call after we hung up the phone to schedule.   She also notes she "did something" to her knee at work a few weeks ago and is planning on seeing walk-in ortho. I advised her to avoid oral NSAIDs, she confirmed that she had not been using any. Denies benefit from acetaminophen. We discuss used of OTC topical diclofenac to her knee until she can see ortho.   Will collaborated with embedded pharmacist for [employer] at upcoming visit.   Catie Hedwig Morton, PharmD, [facility] [phone]

## 2022-02-03 ENCOUNTER — Ambulatory Visit: Payer: Self-pay | Admitting: Pharmacist

## 2022-02-14 ENCOUNTER — Ambulatory Visit: Payer: Medicaid Other

## 2022-02-14 ENCOUNTER — Other Ambulatory Visit: Payer: Medicaid Other | Admitting: Pharmacist

## 2022-02-14 ENCOUNTER — Other Ambulatory Visit: Payer: Self-pay | Admitting: Pharmacist

## 2022-02-14 DIAGNOSIS — I10 Essential (primary) hypertension: Secondary | ICD-10-CM

## 2022-02-14 MED ORDER — LISINOPRIL 40 MG PO TABS: 40.0000 mg | ORAL_TABLET | Freq: Every day | ORAL | 0 refills | Status: DC

## 2022-02-14 NOTE — Progress Notes (Signed)
Chief Complaint  Patient presents with   Diabetes   Hypertension   Medication Management    Linda Phelps is a 51 y.o. year old female who presented for a telephone visit.   They were referred to the pharmacist by a quality report for assistance in managing diabetes and hypertension.   Patient is participating in a Managed Medicaid Plan:  Yes  Subjective:  Care Team: Primary Care Provider: Ladell Pier, MD ; Next Scheduled Visit: not scheduled  Medication Access/Adherence  Current Pharmacy:  [facility] [place] [address] Phone: [phone] Fax: [place], [place] - [address] [place] [address] Phone: [phone] Fax: [phone]   Patient reports affordability concerns with their medications: No  Patient reports access/transportation concerns to their pharmacy: No  Patient reports adherence concerns with their medications:  Yes  reports she has run out of lisinopril. Appears a refill is required. Reports she "tweaked" her knee again and has been unable to work or drive, which is why she missed the appointment with embedded pharmacist   Hypertension:  Current medications: carvedilol 12.5 mg twice daily - reports she did increase to 18.75 mg twice daily but "felt funny", so reduced back to 12.5 mg; amlodipine 10 mg dialy, lisinopril 40 mg daily - reports she has been out for a few days  Patient has a validated, automated, upper arm home BP cuff Current blood pressure readings readings: most recently at goal <130/80, reports days when she is in pain her BP has been significantly higher  Patient denies hypotensive s/sx including dizziness, lightheadedness.  Patient denies hypertensive symptoms including headache, chest pain, shortness of breath  Has not yet scheduled an appointment with nephrology.  Health  Maintenance  Health Maintenance Due  Topic Date Due   COVID-19 Vaccine (1) Never done   OPHTHALMOLOGY EXAM  Never done   PAP SMEAR-Modifier  Never done   COLONOSCOPY (Pts 45-22yr Insurance coverage will need to be confirmed)  Never done   FOOT EXAM  01/08/2018   MAMMOGRAM  Never done   Zoster Vaccines- Shingrix (1 of 2) Never done   Diabetic kidney evaluation - Urine ACR  07/04/2019   INFLUENZA VACCINE  12/20/2021     Objective: Lab Results  Component Value Date   HGBA1C 6.7 (H) 09/26/2021    Lab Results  Component Value Date   CREATININE 2.73 (H) 09/26/2021   BUN 32 (H) 09/26/2021   NA 143 09/26/2021   K 4.6 09/26/2021   CL 105 09/26/2021   CO2 22 09/26/2021    Lab Results  Component Value Date   CHOL 206 (H) 09/26/2021   HDL 55 09/26/2021   LDLCALC 116 (H) 09/26/2021   TRIG 199 (H) 09/26/2021   CHOLHDL 3.7 09/26/2021    Medications Reviewed Today     Reviewed by HOsker Mason RPH-CPP (Pharmacist) on 02/14/22 at [phone] Med List Status: <None>   Medication Order Taking? Sig Documenting Provider Last Dose Status Informant  amLODipine (NORVASC) 10 MG tablet [id number] Take 1 tablet (10 mg total) by mouth daily. FGildardo Pounds NP Taking Active   aspirin 81 MG tablet [id number] Take 1 tablet (81 mg total) by mouth daily. MArgentina Donovan PA-C Taking Active   atorvastatin (LIPITOR) 10 MG tablet [id number] Take 1 tablet (10 mg total) by mouth daily. FGeryl Rankins  W, NP Taking Active   Blood Glucose Monitoring Suppl (TRUE METRIX METER) w/Device KIT [id number] Yes Use to check blood sugar 3 times daily. Ladell Pier, MD Taking Active   Blood Pressure Monitor DEVI [id number] Yes Use to check blood pressure daily Ladell Pier, MD Taking Active   carvedilol (COREG) 12.5 MG tablet [id number] Yes TAKE 1 & 1/2 (ONE & ONE-HALF) TABLETS BY MOUTH TWICE DAILY WITH A MEAL Gildardo Pounds, NP Taking Active            Med Note Jodi Mourning, Dorea Duff T   Tue  Feb 14, 2022  9:06 AM) 12.5 mg twice daily  cyclobenzaprine (FLEXERIL) 10 MG tablet [id number] No Take 1 tablet (10 mg total) by mouth at bedtime.  Patient not taking: Reported on 11/16/2021   Gildardo Pounds, NP Not Taking Active   furosemide (LASIX) 20 MG tablet [id number] Yes Take 1 tablet (20 mg total) by mouth 2 (two) times daily. Gildardo Pounds, NP Taking Active   glipiZIDE (GLUCOTROL XL) 5 MG 24 hr tablet [id number] Yes Take 1 tablet (5 mg total) by mouth daily with breakfast. Gildardo Pounds, NP Taking Active   glucose blood (TRUE METRIX BLOOD GLUCOSE TEST) test strip [id number] No Use to check blood sugar 3 times daily.  Patient not taking: Reported on 02/14/2022   Ladell Pier, MD Not Taking Active   levothyroxine (SYNTHROID) 50 MCG tablet [id number] Yes Take 1 tablet (50 mcg total) by mouth daily. Gildardo Pounds, NP Taking Active   lisinopril (ZESTRIL) 40 MG tablet [id number] No Take 1 tablet (40 mg total) by mouth daily.  Patient not taking: Reported on 02/14/2022   Ladell Pier, MD Not Taking Active   metFORMIN (GLUCOPHAGE) 1000 MG tablet [id number] Yes Take 0.5 tablets (500 mg total) by mouth 2 (two) times daily with a meal. DOSE CHANGE Gildardo Pounds, NP Taking Active   pantoprazole (PROTONIX) 40 MG tablet [id number] Yes Take 1 tablet (40 mg total) by mouth daily. Gildardo Pounds, NP Taking Active   triamcinolone (KENALOG) 0.025 % ointment [id number] Yes Apply 1 application. topically 2 (two) times daily. Gildardo Pounds, NP Taking Active            Med Note Jodi Mourning, Jerremy Maione T   Thu Dec 29, 2021  9:03 AM) Applying 4 times daily  TRUEplus Lancets 28G MISC [id number] Yes Use to check blood sugar 3 times daily. Ladell Pier, MD Taking Active               Assessment/Plan:   Hypertension: - Currently controlled at home - Reviewed long term cardiovascular and renal outcomes of uncontrolled blood pressure - Reviewed appropriate blood pressure monitoring  technique and reviewed goal blood pressure. Recommended to check home blood pressure and heart rate 2-3 times weekly as she has been doing - Recommend to call the pharmacy to request refills before running out of her medication. Refill needed on lisinopril, will collaborate with clinical team - Encouraged to call and schedule appointment with nephrology    Follow Up Plan: phone call in ~6 weeks  Catie Hedwig Morton, PharmD, [facility] [phone]

## 2022-02-27 ENCOUNTER — Ambulatory Visit: Payer: Self-pay | Admitting: Pharmacist

## 2022-03-07 ENCOUNTER — Other Ambulatory Visit: Payer: Self-pay | Admitting: Nurse Practitioner

## 2022-03-07 DIAGNOSIS — I10 Essential (primary) hypertension: Secondary | ICD-10-CM

## 2022-03-08 NOTE — Telephone Encounter (Signed)
Requested medication (s) are due for refill today:   Yes  Requested medication (s) are on the active medication list:   Yes  Future visit scheduled:   No   Has been a No Show for multiple appts.   Last ordered: Lasix 09/26/2021 #60, 1 refill;  Coreg 09/26/2021 #90, 0 refills  Returned for provider review due to multiple No Shows for appts.      Requested Prescriptions  Pending Prescriptions Disp Refills   furosemide (LASIX) 20 MG tablet [Pharmacy Med Name: Furosemide 20 MG Oral Tablet] 60 tablet 0    Sig: Take 1 tablet by mouth twice daily     Cardiovascular:  Diuretics - Loop Failed - 03/07/2022  6:19 PM      Failed - Cr in normal range and within 180 days    Creat  Date Value Ref Range Status  07/11/2016 1.21 (H) 0.50 - 1.10 mg/dL Final   Creatinine, Ser  Date Value Ref Range Status  09/26/2021 2.73 (H) 0.57 - 1.00 mg/dL Final   Creatinine, POC  Date Value Ref Range Status  01/08/2017 100 mg/dL Final   Creatinine, Urine  Date Value Ref Range Status  04/13/2015 34 20 - 320 mg/dL Final         Failed - Mg Level in normal range and within 180 days    No results found for: "MG"       Passed - K in normal range and within 180 days    Potassium  Date Value Ref Range Status  09/26/2021 4.6 3.5 - 5.2 mmol/L Final         Passed - Ca in normal range and within 180 days    Calcium  Date Value Ref Range Status  09/26/2021 9.6 8.7 - 10.2 mg/dL Final         Passed - Na in normal range and within 180 days    Sodium  Date Value Ref Range Status  09/26/2021 143 134 - 144 mmol/L Final         Passed - Cl in normal range and within 180 days    Chloride  Date Value Ref Range Status  09/26/2021 105 96 - 106 mmol/L Final         Passed - Last BP in normal range    BP Readings from Last 1 Encounters:  02/12/22 122/72         Passed - Valid encounter within last 6 months    Recent Outpatient Visits           5 months ago Essential hypertension   [place], Vernia Buff, NP   1 year ago Primary hypertension   [place], Vernia Buff, NP   1 year ago Pure hypercholesterolemia   [place], [place], [place]   1 year ago Heel spur, right   [place] Ladell Pier, MD   2 years ago Hordeolum externum of left lower eyelid   Primary Care at [facility], Bayard Beaver, MD               carvedilol (COREG) 12.5 MG tablet [Pharmacy Med Name: Carvedilol 12.5 MG Oral Tablet] 90 tablet 0    Sig: TAKE ONE AND ONE HALF TABLET BY MOUTH TWICE DAILY WITH A MEAL     Cardiovascular: Beta Blockers 3 Failed - 03/07/2022  6:19 PM  Failed - Cr in normal range and within 360 days    Creat  Date Value Ref Range Status  07/11/2016 1.21 (H) 0.50 - 1.10 mg/dL Final   Creatinine, Ser  Date Value Ref Range Status  09/26/2021 2.73 (H) 0.57 - 1.00 mg/dL Final   Creatinine, POC  Date Value Ref Range Status  01/08/2017 100 mg/dL Final   Creatinine, Urine  Date Value Ref Range Status  04/13/2015 34 20 - 320 mg/dL Final         Passed - AST in normal range and within 360 days    AST  Date Value Ref Range Status  09/26/2021 9 0 - 40 IU/L Final         Passed - ALT in normal range and within 360 days    ALT  Date Value Ref Range Status  09/26/2021 8 0 - 32 IU/L Final         Passed - Last BP in normal range    BP Readings from Last 1 Encounters:  02/12/22 122/72         Passed - Last Heart Rate in normal range    Pulse Readings from Last 1 Encounters:  09/26/21 84         Passed - Valid encounter within last 6 months    Recent Outpatient Visits           5 months ago Essential hypertension   [place], Vernia Buff, NP   1 year ago Primary hypertension   [place], Vernia Buff, NP   1 year ago Pure hypercholesterolemia    [place], [place], [place]   1 year ago Heel spur, right   [place] Ladell Pier, MD   2 years ago Hordeolum externum of left lower eyelid   Primary Care at [facility], Bayard Beaver, MD

## 2022-03-10 ENCOUNTER — Other Ambulatory Visit: Payer: Self-pay | Admitting: Nurse Practitioner

## 2022-03-10 DIAGNOSIS — I10 Essential (primary) hypertension: Secondary | ICD-10-CM

## 2022-03-10 DIAGNOSIS — E1165 Type 2 diabetes mellitus with hyperglycemia: Secondary | ICD-10-CM

## 2022-03-10 DIAGNOSIS — E039 Hypothyroidism, unspecified: Secondary | ICD-10-CM

## 2022-03-10 NOTE — Telephone Encounter (Signed)
Unable to refill per protocol, rx was refuse, duplicate request. Will refuse.  Requested Prescriptions  Pending Prescriptions Disp Refills  . furosemide (LASIX) 20 MG tablet [Pharmacy Med Name: Furosemide 20 MG Oral Tablet] 60 tablet 0    Sig: Take 1 tablet by mouth twice daily     Cardiovascular:  Diuretics - Loop Failed - 03/10/2022  2:12 PM      Failed - Cr in normal range and within 180 days    Creat  Date Value Ref Range Status  07/11/2016 1.21 (H) 0.50 - 1.10 mg/dL Final   Creatinine, Ser  Date Value Ref Range Status  09/26/2021 2.73 (H) 0.57 - 1.00 mg/dL Final   Creatinine, POC  Date Value Ref Range Status  01/08/2017 100 mg/dL Final   Creatinine, Urine  Date Value Ref Range Status  04/13/2015 34 20 - 320 mg/dL Final         Failed - Mg Level in normal range and within 180 days    No results found for: "MG"       Passed - K in normal range and within 180 days    Potassium  Date Value Ref Range Status  09/26/2021 4.6 3.5 - 5.2 mmol/L Final         Passed - Ca in normal range and within 180 days    Calcium  Date Value Ref Range Status  09/26/2021 9.6 8.7 - 10.2 mg/dL Final         Passed - Na in normal range and within 180 days    Sodium  Date Value Ref Range Status  09/26/2021 143 134 - 144 mmol/L Final         Passed - Cl in normal range and within 180 days    Chloride  Date Value Ref Range Status  09/26/2021 105 96 - 106 mmol/L Final         Passed - Last BP in normal range    BP Readings from Last 1 Encounters:  02/12/22 122/72         Passed - Valid encounter within last 6 months    Recent Outpatient Visits          5 months ago Essential hypertension   [place], [place] W, NP   1 year ago Primary hypertension   [place], Vernia Buff, NP   1 year ago Pure hypercholesterolemia   [place], [place], [place]   1 year ago Heel  spur, right   [place], Neoma Laming B, MD   2 years ago Hordeolum externum of left lower eyelid   Primary Care at [facility], Bayard Beaver, MD             Signed Prescriptions Disp Refills   glipiZIDE (GLUCOTROL XL) 5 MG 24 hr tablet 90 tablet 0    Sig: Take 1 tablet by mouth once daily with breakfast     Endocrinology:  Diabetes - Sulfonylureas Failed - 03/10/2022  2:12 PM      Failed - Cr in normal range and within 360 days    Creat  Date Value Ref Range Status  07/11/2016 1.21 (H) 0.50 - 1.10 mg/dL Final   Creatinine, Ser  Date Value Ref Range Status  09/26/2021 2.73 (H) 0.57 - 1.00 mg/dL Final   Creatinine, POC  Date Value Ref Range Status  01/08/2017 100 mg/dL Final  Creatinine, Urine  Date Value Ref Range Status  04/13/2015 34 20 - 320 mg/dL Final         Passed - HBA1C is between 0 and 7.9 and within 180 days    Hgb A1c MFr Bld  Date Value Ref Range Status  09/26/2021 6.7 (H) 4.8 - 5.6 % Final    Comment:             Prediabetes: 5.7 - 6.4          Diabetes: >6.4          Glycemic control for adults with diabetes: <7.0          Passed - Valid encounter within last 6 months    Recent Outpatient Visits          5 months ago Essential hypertension   [place], Vernia Buff, NP   1 year ago Primary hypertension   [place], Vernia Buff, NP   1 year ago Pure hypercholesterolemia   [place], [place], [place]   1 year ago Heel spur, right   [place], Neoma Laming B, MD   2 years ago Hordeolum externum of left lower eyelid   Primary Care at [facility], Bayard Beaver, MD              amLODipine (NORVASC) 10 MG tablet 90 tablet 0    Sig: Take 1 tablet by mouth once daily     Cardiovascular: Calcium Channel Blockers 2 Passed - 03/10/2022  2:12  PM      Passed - Last BP in normal range    BP Readings from Last 1 Encounters:  02/12/22 122/72         Passed - Last Heart Rate in normal range    Pulse Readings from Last 1 Encounters:  09/26/21 84         Passed - Valid encounter within last 6 months    Recent Outpatient Visits          5 months ago Essential hypertension   [place], Vernia Buff, NP   1 year ago Primary hypertension   [place], Vernia Buff, NP   1 year ago Pure hypercholesterolemia   [place], [place], [place]   1 year ago Heel spur, right   [place] Ladell Pier, MD   2 years ago Hordeolum externum of left lower eyelid   [facility], Bayard Beaver, MD              levothyroxine (SYNTHROID) 50 MCG tablet 90 tablet 0    Sig: Take 1 tablet by mouth once daily     Endocrinology:  Hypothyroid Agents Passed - 03/10/2022  2:12 PM      Passed - TSH in normal range and within 360 days    TSH  Date Value Ref Range Status  09/26/2021 3.600 0.450 - 4.500 uIU/mL Final         Passed - Valid encounter within last 12 months    Recent Outpatient Visits          5 months ago Essential hypertension   [place], Vernia Buff, NP   1 year ago Primary hypertension   [place], Vernia Buff, NP   1 year ago Pure hypercholesterolemia   [place], [place]   1 year ago Heel spur, right   [place], MD   2 years ago Hordeolum externum of left lower eyelid   Primary Care at [facility], Bayard Beaver, MD

## 2022-03-10 NOTE — Telephone Encounter (Signed)
Requested Prescriptions  Pending Prescriptions Disp Refills  . glipiZIDE (GLUCOTROL XL) 5 MG 24 hr tablet [Pharmacy Med Name: glipiZIDE ER 5 MG Oral Tablet Extended Release 24 Hour] 90 tablet 0    Sig: Take 1 tablet by mouth once daily with breakfast     Endocrinology:  Diabetes - Sulfonylureas Failed - 03/10/2022  2:12 PM      Failed - Cr in normal range and within 360 days    Creat  Date Value Ref Range Status  07/11/2016 1.21 (H) 0.50 - 1.10 mg/dL Final   Creatinine, Ser  Date Value Ref Range Status  09/26/2021 2.73 (H) 0.57 - 1.00 mg/dL Final   Creatinine, POC  Date Value Ref Range Status  01/08/2017 100 mg/dL Final   Creatinine, Urine  Date Value Ref Range Status  04/13/2015 34 20 - 320 mg/dL Final         Passed - HBA1C is between 0 and 7.9 and within 180 days    Hgb A1c MFr Bld  Date Value Ref Range Status  09/26/2021 6.7 (H) 4.8 - 5.6 % Final    Comment:             Prediabetes: 5.7 - 6.4          Diabetes: >6.4          Glycemic control for adults with diabetes: <7.0          Passed - Valid encounter within last 6 months    Recent Outpatient Visits          5 months ago Essential hypertension   [place], Vernia Buff, NP   1 year ago Primary hypertension   [place], Vernia Buff, NP   1 year ago Pure hypercholesterolemia   [place], [place], [place]   1 year ago Heel spur, right   [place], Neoma Laming B, MD   2 years ago Hordeolum externum of left lower eyelid   Primary Care at [facility], Bayard Beaver, MD             . amLODipine ([place]) 10 MG tablet [Pharmacy Med Name: amLODIPine Besylate 10 MG Oral Tablet] 90 tablet 0    Sig: Take 1 tablet by mouth once daily     Cardiovascular: Calcium Channel Blockers 2 Passed - 03/10/2022  2:12 PM      Passed - Last BP in normal range    BP  Readings from Last 1 Encounters:  02/12/22 122/72         Passed - Last Heart Rate in normal range    Pulse Readings from Last 1 Encounters:  09/26/21 84         Passed - Valid encounter within last 6 months    Recent Outpatient Visits          5 months ago Essential hypertension   [place], Vernia Buff, NP   1 year ago Primary hypertension   [place], Vernia Buff, NP   1 year ago Pure hypercholesterolemia   [place], [place], [place]   1 year ago Heel spur, right   [place], MD   2 years ago Hordeolum externum of left lower eyelid   Primary Care at [facility], [place]  Lauren, MD             . levothyroxine (SYNTHROID) 50 MCG tablet [Pharmacy Med Name: Levothyroxine Sodium 50 MCG Oral Tablet] 90 tablet 0    Sig: Take 1 tablet by mouth once daily     Endocrinology:  Hypothyroid Agents Passed - 03/10/2022  2:12 PM      Passed - TSH in normal range and within 360 days    TSH  Date Value Ref Range Status  09/26/2021 3.600 0.450 - 4.500 uIU/mL Final         Passed - Valid encounter within last 12 months    Recent Outpatient Visits          5 months ago Essential hypertension   [place], [place] W, NP   1 year ago Primary hypertension   [place], Vernia Buff, NP   1 year ago Pure hypercholesterolemia   [place], [place], [place]   1 year ago Heel spur, right   [place], [place], MD   2 years ago Hordeolum externum of left lower eyelid   Primary Care at [facility], Bayard Beaver, MD             . furosemide (LASIX) 20 MG tablet [Pharmacy Med Name: Furosemide 20 MG Oral Tablet] 60 tablet 0    Sig: Take 1 tablet by mouth  twice daily     Cardiovascular:  Diuretics - Loop Failed - 03/10/2022  2:12 PM      Failed - Cr in normal range and within 180 days    Creat  Date Value Ref Range Status  07/11/2016 1.21 (H) 0.50 - 1.10 mg/dL Final   Creatinine, Ser  Date Value Ref Range Status  09/26/2021 2.73 (H) 0.57 - 1.00 mg/dL Final   Creatinine, POC  Date Value Ref Range Status  01/08/2017 100 mg/dL Final   Creatinine, Urine  Date Value Ref Range Status  04/13/2015 34 20 - 320 mg/dL Final         Failed - Mg Level in normal range and within 180 days    No results found for: "MG"       Passed - K in normal range and within 180 days    Potassium  Date Value Ref Range Status  09/26/2021 4.6 3.5 - 5.2 mmol/L Final         Passed - Ca in normal range and within 180 days    Calcium  Date Value Ref Range Status  09/26/2021 9.6 8.7 - 10.2 mg/dL Final         Passed - Na in normal range and within 180 days    Sodium  Date Value Ref Range Status  09/26/2021 143 134 - 144 mmol/L Final         Passed - Cl in normal range and within 180 days    Chloride  Date Value Ref Range Status  09/26/2021 105 96 - 106 mmol/L Final         Passed - Last BP in normal range    BP Readings from Last 1 Encounters:  02/12/22 122/72         Passed - Valid encounter within last 6 months    Recent Outpatient Visits          5 months ago Essential hypertension   [place]  Gildardo Pounds, NP   1 year ago Primary hypertension   [place], Zelda W, NP   1 year ago Pure hypercholesterolemia   [place], [place]   1 year ago Heel spur, right   [place] Ladell Pier, MD   2 years ago Hordeolum externum of left lower eyelid   Primary Care at [facility], Bayard Beaver, MD

## 2022-03-17 ENCOUNTER — Other Ambulatory Visit: Payer: Self-pay | Admitting: Nurse Practitioner

## 2022-03-17 DIAGNOSIS — I10 Essential (primary) hypertension: Secondary | ICD-10-CM

## 2022-03-17 NOTE — Telephone Encounter (Signed)
Requested medication (s) are due for refill today - yes  Requested medication (s) are on the active medication list -yes  Future visit scheduled -no  Last refill: furosemide- 09/26/21 #60 1RF                  Carvedilol 09/26/21 #90  Notes to clinic: Office has already refused RF multiple times- needs appointment- request sent for review   Requested Prescriptions  Pending Prescriptions Disp Refills   furosemide (LASIX) 20 MG tablet [Pharmacy Med Name: Furosemide 20 MG Oral Tablet] 60 tablet 0    Sig: Take 1 tablet by mouth twice daily     Cardiovascular:  Diuretics - Loop Failed - 03/17/2022  3:07 PM      Failed - Cr in normal range and within 180 days    Creat  Date Value Ref Range Status  07/11/2016 1.21 (H) 0.50 - 1.10 mg/dL Final   Creatinine, Ser  Date Value Ref Range Status  09/26/2021 2.73 (H) 0.57 - 1.00 mg/dL Final   Creatinine, POC  Date Value Ref Range Status  01/08/2017 100 mg/dL Final   Creatinine, Urine  Date Value Ref Range Status  04/13/2015 34 20 - 320 mg/dL Final         Failed - Mg Level in normal range and within 180 days    No results found for: "MG"       Passed - K in normal range and within 180 days    Potassium  Date Value Ref Range Status  09/26/2021 4.6 3.5 - 5.2 mmol/L Final         Passed - Ca in normal range and within 180 days    Calcium  Date Value Ref Range Status  09/26/2021 9.6 8.7 - 10.2 mg/dL Final         Passed - Na in normal range and within 180 days    Sodium  Date Value Ref Range Status  09/26/2021 143 134 - 144 mmol/L Final         Passed - Cl in normal range and within 180 days    Chloride  Date Value Ref Range Status  09/26/2021 105 96 - 106 mmol/L Final         Passed - Last BP in normal range    BP Readings from Last 1 Encounters:  02/12/22 122/72         Passed - Valid encounter within last 6 months    Recent Outpatient Visits           5 months ago Essential hypertension   [place], Vernia Buff, NP   1 year ago Primary hypertension   [place], Vernia Buff, NP   1 year ago Pure hypercholesterolemia   [place], [place], [place]   1 year ago Heel spur, right   [place] Ladell Pier, MD   2 years ago Hordeolum externum of left lower eyelid   Primary Care at [facility], Bayard Beaver, MD               carvedilol (COREG) 12.5 MG tablet [Pharmacy Med Name: Carvedilol 12.5 MG Oral Tablet] 90 tablet 0    Sig: TAKE ONE AND ONE HALF TABLET BY MOUTH TWICE DAILY WITH A MEAL     Cardiovascular: Beta Blockers 3 Failed - 03/17/2022  3:07 PM  Failed - Cr in normal range and within 360 days    Creat  Date Value Ref Range Status  07/11/2016 1.21 (H) 0.50 - 1.10 mg/dL Final   Creatinine, Ser  Date Value Ref Range Status  09/26/2021 2.73 (H) 0.57 - 1.00 mg/dL Final   Creatinine, POC  Date Value Ref Range Status  01/08/2017 100 mg/dL Final   Creatinine, Urine  Date Value Ref Range Status  04/13/2015 34 20 - 320 mg/dL Final         Passed - AST in normal range and within 360 days    AST  Date Value Ref Range Status  09/26/2021 9 0 - 40 IU/L Final         Passed - ALT in normal range and within 360 days    ALT  Date Value Ref Range Status  09/26/2021 8 0 - 32 IU/L Final         Passed - Last BP in normal range    BP Readings from Last 1 Encounters:  02/12/22 122/72         Passed - Last Heart Rate in normal range    Pulse Readings from Last 1 Encounters:  09/26/21 84         Passed - Valid encounter within last 6 months    Recent Outpatient Visits           5 months ago Essential hypertension   [place], Vernia Buff, NP   1 year ago Primary hypertension   [place], Vernia Buff, NP   1 year ago Pure hypercholesterolemia    [place], [place], [place]   1 year ago Heel spur, right   [place], Neoma Laming B, MD   2 years ago Hordeolum externum of left lower eyelid   Primary Care at [facility], Bayard Beaver, MD                 Requested Prescriptions  Pending Prescriptions Disp Refills   furosemide (LASIX) 20 MG tablet [Pharmacy Med Name: Furosemide 20 MG Oral Tablet] 60 tablet 0    Sig: Take 1 tablet by mouth twice daily     Cardiovascular:  Diuretics - Loop Failed - 03/17/2022  3:07 PM      Failed - Cr in normal range and within 180 days    Creat  Date Value Ref Range Status  07/11/2016 1.21 (H) 0.50 - 1.10 mg/dL Final   Creatinine, Ser  Date Value Ref Range Status  09/26/2021 2.73 (H) 0.57 - 1.00 mg/dL Final   Creatinine, POC  Date Value Ref Range Status  01/08/2017 100 mg/dL Final   Creatinine, Urine  Date Value Ref Range Status  04/13/2015 34 20 - 320 mg/dL Final         Failed - Mg Level in normal range and within 180 days    No results found for: "MG"       Passed - K in normal range and within 180 days    Potassium  Date Value Ref Range Status  09/26/2021 4.6 3.5 - 5.2 mmol/L Final         Passed - Ca in normal range and within 180 days    Calcium  Date Value Ref Range Status  09/26/2021 9.6 8.7 - 10.2 mg/dL Final         Passed - Na in  normal range and within 180 days    Sodium  Date Value Ref Range Status  09/26/2021 143 134 - 144 mmol/L Final         Passed - Cl in normal range and within 180 days    Chloride  Date Value Ref Range Status  09/26/2021 105 96 - 106 mmol/L Final         Passed - Last BP in normal range    BP Readings from Last 1 Encounters:  02/12/22 122/72         Passed - Valid encounter within last 6 months    Recent Outpatient Visits           5 months ago Essential hypertension   [place], [place] W, NP    1 year ago Primary hypertension   [place], Vernia Buff, NP   1 year ago Pure hypercholesterolemia   [place], [place], [place]   1 year ago Heel spur, right   [place], Dal[place] Batman, MD   2 years ago Hordeolum externum of left lower eyelid   Primary Care at [facility], Bayard Beaver, MD               carvedilol (COREG) 12.5 MG tablet [Pharmacy Med Name: Carvedilol 12.5 MG Oral Tablet] 90 tablet 0    Sig: TAKE ONE AND ONE HALF TABLET BY MOUTH TWICE DAILY WITH A MEAL     Cardiovascular: Beta Blockers 3 Failed - 03/17/2022  3:07 PM      Failed - Cr in normal range and within 360 days    Creat  Date Value Ref Range Status  07/11/2016 1.21 (H) 0.50 - 1.10 mg/dL Final   Creatinine, Ser  Date Value Ref Range Status  09/26/2021 2.73 (H) 0.57 - 1.00 mg/dL Final   Creatinine, POC  Date Value Ref Range Status  01/08/2017 100 mg/dL Final   Creatinine, Urine  Date Value Ref Range Status  04/13/2015 34 20 - 320 mg/dL Final         Passed - AST in normal range and within 360 days    AST  Date Value Ref Range Status  09/26/2021 9 0 - 40 IU/L Final         Passed - ALT in normal range and within 360 days    ALT  Date Value Ref Range Status  09/26/2021 8 0 - 32 IU/L Final         Passed - Last BP in normal range    BP Readings from Last 1 Encounters:  02/12/22 122/72         Passed - Last Heart Rate in normal range    Pulse Readings from Last 1 Encounters:  09/26/21 84         Passed - Valid encounter within last 6 months    Recent Outpatient Visits           5 months ago Essential hypertension   [place], Vernia Buff, NP   1 year ago Primary hypertension   [place], Vernia Buff, NP   1 year ago Pure hypercholesterolemia   [place], [place]   1 year ago Heel spur, right   [place] Ladell Pier, MD  2 years ago Hordeolum externum of left lower eyelid   Primary Care at [facility], Bayard Beaver, MD

## 2022-03-20 ENCOUNTER — Other Ambulatory Visit: Payer: Self-pay | Admitting: Nurse Practitioner

## 2022-03-20 DIAGNOSIS — I10 Essential (primary) hypertension: Secondary | ICD-10-CM

## 2022-03-21 NOTE — Telephone Encounter (Signed)
Medication has been refused several times- needs appointment Requested Prescriptions  Pending Prescriptions Disp Refills  . furosemide (LASIX) 20 MG tablet [Pharmacy Med Name: Furosemide 20 MG Oral Tablet] 60 tablet 0    Sig: Take 1 tablet by mouth twice daily     Cardiovascular:  Diuretics - Loop Failed - 03/20/2022  2:24 PM      Failed - Cr in normal range and within 180 days    Creat  Date Value Ref Range Status  07/11/2016 1.21 (H) 0.50 - 1.10 mg/dL Final   Creatinine, Ser  Date Value Ref Range Status  09/26/2021 2.73 (H) 0.57 - 1.00 mg/dL Final   Creatinine, POC  Date Value Ref Range Status  01/08/2017 100 mg/dL Final   Creatinine, Urine  Date Value Ref Range Status  04/13/2015 34 20 - 320 mg/dL Final         Failed - Mg Level in normal range and within 180 days    No results found for: "MG"       Passed - K in normal range and within 180 days    Potassium  Date Value Ref Range Status  09/26/2021 4.6 3.5 - 5.2 mmol/L Final         Passed - Ca in normal range and within 180 days    Calcium  Date Value Ref Range Status  09/26/2021 9.6 8.7 - 10.2 mg/dL Final         Passed - Na in normal range and within 180 days    Sodium  Date Value Ref Range Status  09/26/2021 143 134 - 144 mmol/L Final         Passed - Cl in normal range and within 180 days    Chloride  Date Value Ref Range Status  09/26/2021 105 96 - 106 mmol/L Final         Passed - Last BP in normal range    BP Readings from Last 1 Encounters:  02/12/22 122/72         Passed - Valid encounter within last 6 months    Recent Outpatient Visits          5 months ago Essential hypertension   [place], Vernia Buff, NP   1 year ago Primary hypertension   [place], Vernia Buff, NP   1 year ago Pure hypercholesterolemia   [place], [place], [place]   1 year ago Heel spur, right   [place] Ladell Pier, MD   2 years ago Hordeolum externum of left lower eyelid   Primary Care at [facility], Bayard Beaver, MD

## 2022-03-26 ENCOUNTER — Other Ambulatory Visit: Payer: Self-pay | Admitting: Nurse Practitioner

## 2022-03-26 DIAGNOSIS — I10 Essential (primary) hypertension: Secondary | ICD-10-CM

## 2022-03-27 ENCOUNTER — Telehealth: Payer: Self-pay | Admitting: Pharmacist

## 2022-03-27 ENCOUNTER — Other Ambulatory Visit: Payer: Medicaid Other | Admitting: Pharmacist

## 2022-03-27 NOTE — Telephone Encounter (Signed)
Requested medications are due for refill today.  yes  Requested medications are on the active medications list.  yes  Last refill. 09/26/2021  Future visit scheduled.   no  Notes to clinic.  Pt is overdue for OV.    Requested Prescriptions  Pending Prescriptions Disp Refills   furosemide (LASIX) 20 MG tablet [Pharmacy Med Name: Furosemide 20 MG Oral Tablet] 60 tablet 0    Sig: Take 1 tablet by mouth twice daily     Cardiovascular:  Diuretics - Loop Failed - 03/26/2022  3:58 PM      Failed - K in normal range and within 180 days    Potassium  Date Value Ref Range Status  09/26/2021 4.6 3.5 - 5.2 mmol/L Final         Failed - Ca in normal range and within 180 days    Calcium  Date Value Ref Range Status  09/26/2021 9.6 8.7 - 10.2 mg/dL Final         Failed - Na in normal range and within 180 days    Sodium  Date Value Ref Range Status  09/26/2021 143 134 - 144 mmol/L Final         Failed - Cr in normal range and within 180 days    Creat  Date Value Ref Range Status  07/11/2016 1.21 (H) 0.50 - 1.10 mg/dL Final   Creatinine, Ser  Date Value Ref Range Status  09/26/2021 2.73 (H) 0.57 - 1.00 mg/dL Final   Creatinine, POC  Date Value Ref Range Status  01/08/2017 100 mg/dL Final   Creatinine, Urine  Date Value Ref Range Status  04/13/2015 34 20 - 320 mg/dL Final         Failed - Cl in normal range and within 180 days    Chloride  Date Value Ref Range Status  09/26/2021 105 96 - 106 mmol/L Final         Failed - Mg Level in normal range and within 180 days    No results found for: "MG"       Failed - Valid encounter within last 6 months    Recent Outpatient Visits           6 months ago Essential hypertension   [place], Vernia Buff, NP   1 year ago Primary hypertension   [place], Vernia Buff, NP   1 year ago Pure hypercholesterolemia   [place], [place], [place]   2 years ago Heel spur, right   [place], Deborah B, MD   2 years ago Hordeolum externum of left lower eyelid   Primary Care at [facility], Bayard Beaver, MD              Passed - Last BP in normal range    BP Readings from Last 1 Encounters:  02/12/22 122/72          carvedilol (COREG) 12.5 MG tablet [Pharmacy Med Name: Carvedilol 12.5 MG Oral Tablet] 90 tablet 0    Sig: TAKE ONE AND ONE HALF TABLET BY MOUTH TWICE DAILY WITH A MEAL     Cardiovascular: Beta Blockers 3 Failed - 03/26/2022  3:58 PM      Failed - Cr in normal range and within 360 days    Creat  Date Value Ref Range Status  07/11/2016 1.21 (H) 0.50 - 1.10  mg/dL Final   Creatinine, Ser  Date Value Ref Range Status  09/26/2021 2.73 (H) 0.57 - 1.00 mg/dL Final   Creatinine, POC  Date Value Ref Range Status  01/08/2017 100 mg/dL Final   Creatinine, Urine  Date Value Ref Range Status  04/13/2015 34 20 - 320 mg/dL Final         Failed - Valid encounter within last 6 months    Recent Outpatient Visits           6 months ago Essential hypertension   [place], [place] W, NP   1 year ago Primary hypertension   [place], [place] W, NP   1 year ago Pure hypercholesterolemia   [place], [place], [place]   2 years ago Heel spur, right   [place], Neoma Laming B, MD   2 years ago Hordeolum externum of left lower eyelid   Primary Care at [facility], Bayard Beaver, MD              Passed - AST in normal range and within 360 days    AST  Date Value Ref Range Status  09/26/2021 9 0 - 40 IU/L Final         Passed - ALT in normal range and within 360 days    ALT  Date Value Ref Range Status  09/26/2021 8 0 - 32 IU/L Final         Passed - Last BP in normal  range    BP Readings from Last 1 Encounters:  02/12/22 122/72         Passed - Last Heart Rate in normal range    Pulse Readings from Last 1 Encounters:  09/26/21 84

## 2022-03-27 NOTE — Telephone Encounter (Signed)
Called pt - [id number] for appt.  

## 2022-03-27 NOTE — Progress Notes (Unsigned)
Attempted to contact patient for scheduled appointment for medication management. Patient requested I call back around 2 pm when she was on lunch. Called back as requested. Left HIPAA compliant message for patient to return my call at their convenience.   Catie Hedwig Morton, PharmD, [place], [facility] [phone]

## 2022-04-06 ENCOUNTER — Telehealth: Payer: Self-pay

## 2022-04-06 NOTE — Progress Notes (Signed)
     Care Guide Note  04/06/2022 Name: Stefania Goulart MRN: [id number] DOB: [birth year]  Referred by: Ladell Pier, MD Reason for referral : Care Coordination (Outreach to reschedule Missed f/u with Pharm D Catie )   Benicia Bergevin is a 51 y.o. year old female who is a primary care patient of Ladell Pier, MD. Nathan Stallworth was referred to the pharmacist for assistance related to DM.    An unsuccessful telephone outreach was attempted today to contact the patient who was referred to the pharmacy team for assistance with medication management. Additional attempts will be made to contact the patient.   Noreene Larsson, [place], [place] [address] Direct Dial: [phone] [email or web address]

## 2022-04-11 NOTE — Progress Notes (Signed)
   Care Guide Note  04/11/2022 Name: Linda Phelps MRN: [id number] DOB: 1970-10-07  Referred by: Ladell Pier, MD Reason for referral : Care Coordination (Outreach to reschedule Missed f/u with Pharm D Catie )   Linda Phelps is a 51 y.o. year old female who is a primary care patient of Ladell Pier, MD. Linda Phelps was referred to the pharmacist for assistance related to DM.    Successful contact was made with the patient to discuss pharmacy services including being ready for the pharmacist to call at least 5 minutes before the scheduled appointment time, to have medication bottles and any blood sugar or blood pressure readings ready for review. The patient agreed to meet with the pharmacist via with the pharmacist via telephone visit on (date/time).  05/03/2022 Linda Phelps, [place], [place] [address] Direct Dial: [phone] [email or web address]

## 2022-04-11 NOTE — Telephone Encounter (Signed)
05/03/2022 @ 2pm

## 2022-05-03 ENCOUNTER — Other Ambulatory Visit: Payer: Medicaid Other | Admitting: Pharmacist

## 2022-06-01 ENCOUNTER — Other Ambulatory Visit: Payer: Self-pay | Admitting: Nurse Practitioner

## 2022-06-01 DIAGNOSIS — I10 Essential (primary) hypertension: Secondary | ICD-10-CM

## 2022-06-02 NOTE — Telephone Encounter (Signed)
Requested medication (s) are due for refill today:   Yes  Requested medication (s) are on the active medication list:   Yes  Future visit scheduled:   No   Multiple attempts have been made to contact and noted several No Shows to appts.   Last ordered: 09/26/2021 #60, 1 refill  Returned for provider to review for refills.   Needs to be seen.   Labs overdue per protocol   Requested Prescriptions  Pending Prescriptions Disp Refills   furosemide (LASIX) 20 MG tablet [Pharmacy Med Name: Furosemide 20 MG Oral Tablet] 60 tablet 0    Sig: Take 1 tablet by mouth twice daily     Cardiovascular:  Diuretics - Loop Failed - 06/01/2022  5:58 PM      Failed - K in normal range and within 180 days    Potassium  Date Value Ref Range Status  09/26/2021 4.6 3.5 - 5.2 mmol/L Final         Failed - Ca in normal range and within 180 days    Calcium  Date Value Ref Range Status  09/26/2021 9.6 8.7 - 10.2 mg/dL Final         Failed - Na in normal range and within 180 days    Sodium  Date Value Ref Range Status  09/26/2021 143 134 - 144 mmol/L Final         Failed - Cr in normal range and within 180 days    Creat  Date Value Ref Range Status  07/11/2016 1.21 (H) 0.50 - 1.10 mg/dL Final   Creatinine, Ser  Date Value Ref Range Status  09/26/2021 2.73 (H) 0.57 - 1.00 mg/dL Final   Creatinine, POC  Date Value Ref Range Status  01/08/2017 100 mg/dL Final   Creatinine, Urine  Date Value Ref Range Status  04/13/2015 34 20 - 320 mg/dL Final         Failed - Cl in normal range and within 180 days    Chloride  Date Value Ref Range Status  09/26/2021 105 96 - 106 mmol/L Final         Failed - Mg Level in normal range and within 180 days    No results found for: "MG"       Failed - Valid encounter within last 6 months    Recent Outpatient Visits           8 months ago Essential hypertension   [place], Vernia Buff, NP   1 year ago Primary  hypertension   [place], Vernia Buff, NP   1 year ago Pure hypercholesterolemia   [place], [place], [place]   2 years ago Heel spur, right   [place] Ladell Pier, MD   2 years ago Hordeolum externum of left lower eyelid   Primary Care at [facility], MD              Passed - Last BP in normal range    BP Readings from Last 1 Encounters:  02/12/22 122/72

## 2022-06-15 ENCOUNTER — Other Ambulatory Visit: Payer: Self-pay | Admitting: Nurse Practitioner

## 2022-06-15 DIAGNOSIS — E1165 Type 2 diabetes mellitus with hyperglycemia: Secondary | ICD-10-CM

## 2022-06-16 NOTE — Telephone Encounter (Signed)
Requested medication (s) are due for refill today: yes  Requested medication (s) are on the active medication list: yes  Last refill:  03/10/22 #90  Future visit scheduled: no  Notes to clinic:  overdue lab work   Requested Prescriptions  Pending Prescriptions Disp Refills   glipiZIDE (GLUCOTROL XL) 5 MG 24 hr tablet [Pharmacy Med Name: glipiZIDE ER 5 MG Oral Tablet Extended Release 24 Hour] 90 tablet 0    Sig: Take 1 tablet by mouth once daily with breakfast     Endocrinology:  Diabetes - Sulfonylureas Failed - 06/15/2022  4:56 PM      Failed - HBA1C is between 0 and 7.9 and within 180 days    Hgb A1c MFr Bld  Date Value Ref Range Status  09/26/2021 6.7 (H) 4.8 - 5.6 % Final    Comment:             Prediabetes: 5.7 - 6.4          Diabetes: >6.4          Glycemic control for adults with diabetes: <7.0          Failed - Cr in normal range and within 360 days    Creat  Date Value Ref Range Status  07/11/2016 1.21 (H) 0.50 - 1.10 mg/dL Final   Creatinine, Ser  Date Value Ref Range Status  09/26/2021 2.73 (H) 0.57 - 1.00 mg/dL Final   Creatinine, POC  Date Value Ref Range Status  01/08/2017 100 mg/dL Final   Creatinine, Urine  Date Value Ref Range Status  04/13/2015 34 20 - 320 mg/dL Final         Failed - Valid encounter within last 6 months    Recent Outpatient Visits           8 months ago Essential hypertension   [place], Vernia Buff, NP   1 year ago Primary hypertension   [place], Vernia Buff, NP   1 year ago Pure hypercholesterolemia   [place], [place], [place]   2 years ago Heel spur, right   [place] Ladell Pier, MD   2 years ago Hordeolum externum of left lower eyelid   [place] Primary Care at [facility], Bayard Beaver, MD

## 2022-08-19 ENCOUNTER — Other Ambulatory Visit: Payer: Self-pay | Admitting: Nurse Practitioner

## 2022-08-19 DIAGNOSIS — E1165 Type 2 diabetes mellitus with hyperglycemia: Secondary | ICD-10-CM

## 2022-08-21 NOTE — Telephone Encounter (Signed)
Requested medications are due for refill today.  Yes   Requested medications are on the active medications list.  yes  Last refill. 03/10/2022 #90 0 rf  Future visit scheduled.   no  Notes to clinic.  Pt is more than 3 months overdue for an ov.    Requested Prescriptions  Pending Prescriptions Disp Refills   glipiZIDE (GLUCOTROL XL) 5 MG 24 hr tablet [Pharmacy Med Name: glipiZIDE ER 5 MG Oral Tablet Extended Release 24 Hour] 90 tablet 0    Sig: Take 1 tablet by mouth once daily with breakfast     Endocrinology:  Diabetes - Sulfonylureas Failed - 08/19/2022  4:07 PM      Failed - HBA1C is between 0 and 7.9 and within 180 days    Hgb A1c MFr Bld  Date Value Ref Range Status  09/26/2021 6.7 (H) 4.8 - 5.6 % Final    Comment:             Prediabetes: 5.7 - 6.4          Diabetes: >6.4          Glycemic control for adults with diabetes: <7.0          Failed - Cr in normal range and within 360 days    Creat  Date Value Ref Range Status  07/11/2016 1.21 (H) 0.50 - 1.10 mg/dL Final   Creatinine, Ser  Date Value Ref Range Status  09/26/2021 2.73 (H) 0.57 - 1.00 mg/dL Final   Creatinine, POC  Date Value Ref Range Status  01/08/2017 100 mg/dL Final   Creatinine, Urine  Date Value Ref Range Status  04/13/2015 34 20 - 320 mg/dL Final         Failed - Valid encounter within last 6 months    Recent Outpatient Visits           10 months ago Essential hypertension   [place], Vernia Buff, NP   1 year ago Primary hypertension   [place], Vernia Buff, NP   1 year ago Pure hypercholesterolemia   [place], [place], [place]   2 years ago Heel spur, right   [place] Ladell Pier, MD   3 years ago Hordeolum externum of left lower eyelid   [place] [facility] at [facility], Bayard Beaver, MD

## 2022-10-02 ENCOUNTER — Other Ambulatory Visit: Payer: Self-pay | Admitting: Nurse Practitioner

## 2022-10-02 DIAGNOSIS — E7800 Pure hypercholesterolemia, unspecified: Secondary | ICD-10-CM

## 2022-10-16 ENCOUNTER — Other Ambulatory Visit: Payer: Self-pay | Admitting: Nurse Practitioner

## 2022-10-16 DIAGNOSIS — E7800 Pure hypercholesterolemia, unspecified: Secondary | ICD-10-CM

## 2022-10-23 ENCOUNTER — Other Ambulatory Visit: Payer: Self-pay | Admitting: Internal Medicine

## 2022-10-23 DIAGNOSIS — I10 Essential (primary) hypertension: Secondary | ICD-10-CM

## 2022-10-24 NOTE — Telephone Encounter (Signed)
Call to patient- patient reports she is now going to [facility] for care and will make pharmacy aware Requested Prescriptions  Pending Prescriptions Disp Refills   furosemide (LASIX) 20 MG tablet [Pharmacy Med Name: Furosemide 20 MG Oral Tablet] 60 tablet 0    Sig: TAKE 1 TABLET BY MOUTH TWICE DAILY . APPOINTMENT REQUIRED FOR FUTURE REFILLS     Cardiovascular:  Diuretics - Loop Failed - 10/23/2022  2:43 PM      Failed - K in normal range and within 180 days    Potassium  Date Value Ref Range Status  09/26/2021 4.6 3.5 - 5.2 mmol/L Final         Failed - Ca in normal range and within 180 days    Calcium  Date Value Ref Range Status  09/26/2021 9.6 8.7 - 10.2 mg/dL Final         Failed - Na in normal range and within 180 days    Sodium  Date Value Ref Range Status  09/26/2021 143 134 - 144 mmol/L Final         Failed - Cr in normal range and within 180 days    Creat  Date Value Ref Range Status  07/11/2016 1.21 (H) 0.50 - 1.10 mg/dL Final   Creatinine, Ser  Date Value Ref Range Status  09/26/2021 2.73 (H) 0.57 - 1.00 mg/dL Final   Creatinine, POC  Date Value Ref Range Status  01/08/2017 100 mg/dL Final   Creatinine, Urine  Date Value Ref Range Status  04/13/2015 34 20 - 320 mg/dL Final         Failed - Cl in normal range and within 180 days    Chloride  Date Value Ref Range Status  09/26/2021 105 96 - 106 mmol/L Final         Failed - Mg Level in normal range and within 180 days    No results found for: "MG"       Failed - Valid encounter within last 6 months    Recent Outpatient Visits           1 year ago Essential hypertension   [place] [facility] [place], Shea Stakes, NP   1 year ago Primary hypertension   [place] [facility] [place], Shea Stakes, NP   2 years ago Pure hypercholesterolemia   [place] [facility] [place], [place], [place]   2 years ago Heel spur, right   [facility] Marcine Matar, MD   3 years ago Hordeolum externum of left lower eyelid   [place] Primary Care at [facility], Kandee Keen, MD              Passed - Last BP in normal range    BP Readings from Last 1 Encounters:  02/12/22 122/72

## 2022-12-18 ENCOUNTER — Other Ambulatory Visit: Payer: Self-pay

## 2022-12-18 ENCOUNTER — Inpatient Hospital Stay (HOSPITAL_COMMUNITY): Payer: No Typology Code available for payment source

## 2022-12-18 ENCOUNTER — Emergency Department (HOSPITAL_BASED_OUTPATIENT_CLINIC_OR_DEPARTMENT_OTHER): Payer: No Typology Code available for payment source

## 2022-12-18 ENCOUNTER — Encounter (HOSPITAL_BASED_OUTPATIENT_CLINIC_OR_DEPARTMENT_OTHER): Payer: Self-pay | Admitting: Emergency Medicine

## 2022-12-18 ENCOUNTER — Inpatient Hospital Stay (HOSPITAL_BASED_OUTPATIENT_CLINIC_OR_DEPARTMENT_OTHER)
Admission: EM | Admit: 2022-12-18 | Discharge: 2022-12-22 | DRG: 291 | Disposition: A | Discharge status: Home / Self Care | Payer: No Typology Code available for payment source | Attending: Internal Medicine | Admitting: Internal Medicine

## 2022-12-18 DIAGNOSIS — Z79890 Hormone replacement therapy (postmenopausal): Secondary | ICD-10-CM

## 2022-12-18 DIAGNOSIS — Z886 Allergy status to analgesic agent status: Secondary | ICD-10-CM

## 2022-12-18 DIAGNOSIS — Z833 Family history of diabetes mellitus: Secondary | ICD-10-CM

## 2022-12-18 DIAGNOSIS — E7800 Pure hypercholesterolemia, unspecified: Secondary | ICD-10-CM | POA: Diagnosis present

## 2022-12-18 DIAGNOSIS — Z6836 Body mass index (BMI) 36.0-36.9, adult: Secondary | ICD-10-CM

## 2022-12-18 DIAGNOSIS — Z7984 Long term (current) use of oral hypoglycemic drugs: Secondary | ICD-10-CM

## 2022-12-18 DIAGNOSIS — E1169 Type 2 diabetes mellitus with other specified complication: Secondary | ICD-10-CM | POA: Diagnosis not present

## 2022-12-18 DIAGNOSIS — E785 Hyperlipidemia, unspecified: Secondary | ICD-10-CM | POA: Diagnosis present

## 2022-12-18 DIAGNOSIS — N184 Chronic kidney disease, stage 4 (severe): Secondary | ICD-10-CM | POA: Diagnosis present

## 2022-12-18 DIAGNOSIS — J9601 Acute respiratory failure with hypoxia: Secondary | ICD-10-CM | POA: Diagnosis not present

## 2022-12-18 DIAGNOSIS — I130 Hypertensive heart and chronic kidney disease with heart failure and stage 1 through stage 4 chronic kidney disease, or unspecified chronic kidney disease: Principal | ICD-10-CM | POA: Diagnosis present

## 2022-12-18 DIAGNOSIS — E669 Obesity, unspecified: Secondary | ICD-10-CM | POA: Diagnosis present

## 2022-12-18 DIAGNOSIS — Z8241 Family history of sudden cardiac death: Secondary | ICD-10-CM | POA: Diagnosis not present

## 2022-12-18 DIAGNOSIS — Z79899 Other long term (current) drug therapy: Secondary | ICD-10-CM

## 2022-12-18 DIAGNOSIS — Z800 Family history of malignant neoplasm of digestive organs: Secondary | ICD-10-CM | POA: Diagnosis not present

## 2022-12-18 DIAGNOSIS — M545 Low back pain, unspecified: Secondary | ICD-10-CM | POA: Diagnosis present

## 2022-12-18 DIAGNOSIS — E782 Mixed hyperlipidemia: Secondary | ICD-10-CM | POA: Diagnosis not present

## 2022-12-18 DIAGNOSIS — I10 Essential (primary) hypertension: Secondary | ICD-10-CM | POA: Diagnosis not present

## 2022-12-18 DIAGNOSIS — I2489 Other forms of acute ischemic heart disease: Secondary | ICD-10-CM | POA: Diagnosis present

## 2022-12-18 DIAGNOSIS — E1122 Type 2 diabetes mellitus with diabetic chronic kidney disease: Secondary | ICD-10-CM | POA: Diagnosis present

## 2022-12-18 DIAGNOSIS — I5033 Acute on chronic diastolic (congestive) heart failure: Secondary | ICD-10-CM

## 2022-12-18 DIAGNOSIS — I429 Cardiomyopathy, unspecified: Secondary | ICD-10-CM | POA: Diagnosis present

## 2022-12-18 DIAGNOSIS — N189 Chronic kidney disease, unspecified: Secondary | ICD-10-CM

## 2022-12-18 DIAGNOSIS — E039 Hypothyroidism, unspecified: Secondary | ICD-10-CM | POA: Diagnosis present

## 2022-12-18 DIAGNOSIS — I5021 Acute systolic (congestive) heart failure: Secondary | ICD-10-CM | POA: Diagnosis present

## 2022-12-18 DIAGNOSIS — I160 Hypertensive urgency: Secondary | ICD-10-CM | POA: Diagnosis not present

## 2022-12-18 DIAGNOSIS — E1165 Type 2 diabetes mellitus with hyperglycemia: Secondary | ICD-10-CM | POA: Diagnosis present

## 2022-12-18 DIAGNOSIS — N179 Acute kidney failure, unspecified: Secondary | ICD-10-CM

## 2022-12-18 DIAGNOSIS — Z9049 Acquired absence of other specified parts of digestive tract: Secondary | ICD-10-CM | POA: Diagnosis not present

## 2022-12-18 DIAGNOSIS — E119 Type 2 diabetes mellitus without complications: Principal | ICD-10-CM

## 2022-12-18 DIAGNOSIS — I161 Hypertensive emergency: Secondary | ICD-10-CM | POA: Diagnosis present

## 2022-12-18 DIAGNOSIS — Z7982 Long term (current) use of aspirin: Secondary | ICD-10-CM

## 2022-12-18 DIAGNOSIS — R7989 Other specified abnormal findings of blood chemistry: Secondary | ICD-10-CM | POA: Diagnosis not present

## 2022-12-18 DIAGNOSIS — K219 Gastro-esophageal reflux disease without esophagitis: Secondary | ICD-10-CM | POA: Diagnosis present

## 2022-12-18 DIAGNOSIS — Z1152 Encounter for screening for COVID-19: Secondary | ICD-10-CM | POA: Diagnosis not present

## 2022-12-18 DIAGNOSIS — J810 Acute pulmonary edema: Secondary | ICD-10-CM | POA: Diagnosis not present

## 2022-12-18 LAB — COMPREHENSIVE METABOLIC PANEL
ALT: 9 U/L (ref 0–44)
AST: 13 U/L — ABNORMAL LOW (ref 15–41)
Albumin: 3.6 g/dL (ref 3.5–5.0)
Alkaline Phosphatase: 96 U/L (ref 38–126)
Anion gap: 9 (ref 5–15)
BUN: 40 mg/dL — ABNORMAL HIGH (ref 6–20)
CO2: 20 mmol/L — ABNORMAL LOW (ref 22–32)
Calcium: 8.7 mg/dL — ABNORMAL LOW (ref 8.9–10.3)
Chloride: 110 mmol/L (ref 98–111)
Creatinine, Ser: 4.54 mg/dL — ABNORMAL HIGH (ref 0.44–1.00)
GFR, Estimated: 11 mL/min — ABNORMAL LOW (ref >60)
Glucose, Bld: 186 mg/dL — ABNORMAL HIGH (ref 70–99)
Potassium: 4.7 mmol/L (ref 3.5–5.1)
Sodium: 139 mmol/L (ref 135–145)
Total Bilirubin: 0.5 mg/dL (ref 0.3–1.2)
Total Protein: 7.8 g/dL (ref 6.5–8.1)

## 2022-12-18 LAB — CBC WITH DIFFERENTIAL/PLATELET
Abs Immature Granulocytes: 0.01 10*3/uL (ref 0.00–0.07)
Basophils Absolute: 0.1 10*3/uL (ref 0.0–0.1)
Basophils Relative: 1 %
Eosinophils Absolute: 0.3 10*3/uL (ref 0.0–0.5)
Eosinophils Relative: 4 %
HCT: 37.1 % (ref 36.0–46.0)
Hemoglobin: 12.3 g/dL (ref 12.0–15.0)
Immature Granulocytes: 0 %
Lymphocytes Relative: 51 %
Lymphs Abs: 3.7 10*3/uL (ref 0.7–4.0)
MCH: 29.9 pg (ref 26.0–34.0)
MCHC: 33.2 g/dL (ref 30.0–36.0)
MCV: 90.3 fL (ref 80.0–100.0)
Monocytes Absolute: 0.7 10*3/uL (ref 0.1–1.0)
Monocytes Relative: 10 %
Neutro Abs: 2.4 10*3/uL (ref 1.7–7.7)
Neutrophils Relative %: 34 %
Platelets: 343 10*3/uL (ref 150–400)
RBC: 4.11 MIL/uL (ref 3.87–5.11)
RDW: 12.9 % (ref 11.5–15.5)
WBC: 7.1 10*3/uL (ref 4.0–10.5)
nRBC: 0 % (ref 0.0–0.2)

## 2022-12-18 LAB — URINALYSIS, ROUTINE W REFLEX MICROSCOPIC
Bilirubin Urine: NEGATIVE
Glucose, UA: 100 mg/dL — AB
Ketones, ur: NEGATIVE mg/dL
Leukocytes,Ua: NEGATIVE
Nitrite: NEGATIVE
Protein, ur: >=300 mg/dL — AB
Specific Gravity, Urine: 1.02 (ref 1.005–1.030)
pH: 6.5 (ref 5.0–8.0)

## 2022-12-18 LAB — GLUCOSE, CAPILLARY
Glucose-Capillary: 120 mg/dL — ABNORMAL HIGH (ref 70–99)
Glucose-Capillary: 155 mg/dL — ABNORMAL HIGH (ref 70–99)

## 2022-12-18 LAB — RESP PANEL BY RT-PCR (RSV, FLU A&B, COVID)  RVPGX2
Influenza A by PCR: NEGATIVE
Influenza B by PCR: NEGATIVE
Resp Syncytial Virus by PCR: NEGATIVE
SARS Coronavirus 2 by RT PCR: NEGATIVE

## 2022-12-18 LAB — I-STAT VENOUS BLOOD GAS, ED
Acid-base deficit: 7 mmol/L — ABNORMAL HIGH (ref 0.0–2.0)
Bicarbonate: 18 mmol/L — ABNORMAL LOW (ref 20.0–28.0)
Calcium, Ion: 1.1 mmol/L — ABNORMAL LOW (ref 1.15–1.40)
HCT: 34 % — ABNORMAL LOW (ref 36.0–46.0)
Hemoglobin: 11.6 g/dL — ABNORMAL LOW (ref 12.0–15.0)
O2 Saturation: 74 %
Patient temperature: 99
Potassium: 5.1 mmol/L (ref 3.5–5.1)
Sodium: 139 mmol/L (ref 135–145)
TCO2: 19 mmol/L — ABNORMAL LOW (ref 22–32)
pCO2, Ven: 32.4 mmHg — ABNORMAL LOW (ref 44–60)
pH, Ven: 7.354 (ref 7.25–7.43)
pO2, Ven: 41 mmHg (ref 32–45)

## 2022-12-18 LAB — TSH: TSH: 2.565 u[IU]/mL (ref 0.350–4.500)

## 2022-12-18 LAB — LACTIC ACID, PLASMA
Lactic Acid, Venous: 1.3 mmol/L (ref 0.5–1.9)
Lactic Acid, Venous: 1 mmol/L (ref 0.5–1.9)

## 2022-12-18 LAB — TROPONIN I (HIGH SENSITIVITY)
Troponin I (High Sensitivity): 92 ng/L — ABNORMAL HIGH (ref <18)
Troponin I (High Sensitivity): 75 ng/L — ABNORMAL HIGH (ref <18)

## 2022-12-18 LAB — URINALYSIS, MICROSCOPIC (REFLEX)

## 2022-12-18 LAB — CBG MONITORING, ED
Glucose-Capillary: 179 mg/dL — ABNORMAL HIGH (ref 70–99)
Glucose-Capillary: 86 mg/dL (ref 70–99)

## 2022-12-18 LAB — BRAIN NATRIURETIC PEPTIDE: B Natriuretic Peptide: 537.4 pg/mL — ABNORMAL HIGH (ref 0.0–100.0)

## 2022-12-18 LAB — HIV ANTIBODY (ROUTINE TESTING W REFLEX): HIV Screen 4th Generation wRfx: NONREACTIVE

## 2022-12-18 MED ORDER — NITROGLYCERIN IN D5W 200-5 MCG/ML-% IV SOLN
0.0000 ug/min | INTRAVENOUS | Status: DC
  Administered 2022-12-18: 50 ug/min via INTRAVENOUS
  Filled 2022-12-18: qty 250

## 2022-12-18 MED ORDER — HYDRALAZINE HCL 20 MG/ML IJ SOLN: 10.0000 mg | INTRAMUSCULAR | Status: DC | PRN

## 2022-12-18 MED ORDER — CARVEDILOL 12.5 MG PO TABS
12.5000 mg | ORAL_TABLET | Freq: Two times a day (BID) | ORAL | Status: DC
  Administered 2022-12-18 – 2022-12-22 (×9): 12.5 mg via ORAL
  Filled 2022-12-18 (×9): qty 1

## 2022-12-18 MED ORDER — ASPIRIN 81 MG PO TBEC
81.0000 mg | DELAYED_RELEASE_TABLET | Freq: Every day | ORAL | Status: DC
  Administered 2022-12-18 – 2022-12-22 (×5): 81 mg via ORAL
  Filled 2022-12-18 (×5): qty 1

## 2022-12-18 MED ORDER — DOXYCYCLINE HYCLATE 100 MG PO TABS
100.0000 mg | ORAL_TABLET | Freq: Once | ORAL | Status: AC
  Administered 2022-12-18: 100 mg via ORAL
  Filled 2022-12-18: qty 1

## 2022-12-18 MED ORDER — INSULIN ASPART 100 UNIT/ML IJ SOLN: 0.0000 [IU] | Freq: Every day | INTRAMUSCULAR | Status: DC

## 2022-12-18 MED ORDER — FUROSEMIDE 10 MG/ML IJ SOLN
40.0000 mg | Freq: Two times a day (BID) | INTRAMUSCULAR | Status: DC
  Administered 2022-12-18 – 2022-12-19 (×3): 40 mg via INTRAVENOUS
  Filled 2022-12-18 (×3): qty 4

## 2022-12-18 MED ORDER — INSULIN ASPART 100 UNIT/ML IJ SOLN
0.0000 [IU] | Freq: Three times a day (TID) | INTRAMUSCULAR | Status: DC
  Administered 2022-12-19 – 2022-12-20 (×3): 2 [IU] via SUBCUTANEOUS
  Administered 2022-12-20 (×2): 1 [IU] via SUBCUTANEOUS
  Administered 2022-12-21: 2 [IU] via SUBCUTANEOUS
  Administered 2022-12-21: 1 [IU] via SUBCUTANEOUS
  Administered 2022-12-22: 2 [IU] via SUBCUTANEOUS
  Administered 2022-12-22: 1 [IU] via SUBCUTANEOUS

## 2022-12-18 MED ORDER — NITROGLYCERIN 2 % TD OINT
1.0000 [in_us] | TOPICAL_OINTMENT | Freq: Once | TRANSDERMAL | Status: AC
  Administered 2022-12-18: 1 [in_us] via TOPICAL
  Filled 2022-12-18: qty 1

## 2022-12-18 MED ORDER — METOPROLOL TARTRATE 5 MG/5ML IV SOLN: 5.0000 mg | INTRAVENOUS | Status: DC | PRN

## 2022-12-18 MED ORDER — LEVOTHYROXINE SODIUM 50 MCG PO TABS
50.0000 ug | ORAL_TABLET | Freq: Every day | ORAL | Status: DC
  Administered 2022-12-19 – 2022-12-22 (×4): 50 ug via ORAL
  Filled 2022-12-18 (×4): qty 1

## 2022-12-18 MED ORDER — ATORVASTATIN CALCIUM 10 MG PO TABS
10.0000 mg | ORAL_TABLET | Freq: Every day | ORAL | Status: DC
  Administered 2022-12-18 – 2022-12-22 (×5): 10 mg via ORAL
  Filled 2022-12-18 (×5): qty 1

## 2022-12-18 MED ORDER — SODIUM CHLORIDE 0.9 % IV SOLN
1.0000 g | Freq: Once | INTRAVENOUS | Status: AC
  Administered 2022-12-18: 1 g via INTRAVENOUS
  Filled 2022-12-18: qty 10

## 2022-12-18 MED ORDER — IPRATROPIUM-ALBUTEROL 0.5-2.5 (3) MG/3ML IN SOLN: 3.0000 mL | RESPIRATORY_TRACT | Status: DC | PRN

## 2022-12-18 MED ORDER — ONDANSETRON HCL 4 MG/2ML IJ SOLN: 4.0000 mg | Freq: Once | INTRAMUSCULAR | Status: AC

## 2022-12-18 MED ORDER — AMLODIPINE BESYLATE 10 MG PO TABS
10.0000 mg | ORAL_TABLET | Freq: Every day | ORAL | Status: DC
  Administered 2022-12-18 – 2022-12-20 (×3): 10 mg via ORAL
  Filled 2022-12-18 (×3): qty 1

## 2022-12-18 MED ORDER — HEPARIN SODIUM (PORCINE) 5000 UNIT/ML IJ SOLN
5000.0000 [IU] | Freq: Three times a day (TID) | INTRAMUSCULAR | Status: DC
  Administered 2022-12-18 – 2022-12-22 (×12): 5000 [IU] via SUBCUTANEOUS
  Filled 2022-12-18 (×12): qty 1

## 2022-12-18 MED ORDER — NITROGLYCERIN 2 % TD OINT
0.5000 [in_us] | TOPICAL_OINTMENT | Freq: Four times a day (QID) | TRANSDERMAL | Status: AC
  Administered 2022-12-18 (×2): 0.5 [in_us] via TOPICAL
  Filled 2022-12-18 (×2): qty 1

## 2022-12-18 MED ORDER — ONDANSETRON HCL 4 MG/2ML IJ SOLN
INTRAMUSCULAR | Status: AC
  Administered 2022-12-18: 4 mg via INTRAVENOUS
  Filled 2022-12-18: qty 2

## 2022-12-18 MED ORDER — INSULIN ASPART 100 UNIT/ML IJ SOLN
0.0000 [IU] | Freq: Three times a day (TID) | INTRAMUSCULAR | Status: DC
  Administered 2022-12-18: 3 [IU] via SUBCUTANEOUS

## 2022-12-18 MED ORDER — FUROSEMIDE 10 MG/ML IJ SOLN
60.0000 mg | Freq: Once | INTRAMUSCULAR | Status: AC
  Administered 2022-12-18: 60 mg via INTRAVENOUS
  Filled 2022-12-18: qty 6

## 2022-12-18 NOTE — ED Notes (Signed)
Pt placed on 4L [place] with little improvement. Pt flow increased to 6L with oxygen saturation of 90%.

## 2022-12-18 NOTE — ED Notes (Signed)
Bedside commode next to bed Patient unable to urinate at this time
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
## 2022-12-18 NOTE — ED Notes (Signed)
Only able to obtain one set of blood cultures. EDP aware.

## 2022-12-18 NOTE — ED Notes (Signed)
Called [facility] talked to [place] at 2:52

## 2022-12-18 NOTE — H&P (Signed)
History and Physical    Patient: Linda Phelps LKG:[id number] DOB: [birth year] DOA: 12/18/2022 DOS: the patient was seen and examined on 12/18/2022 PCP: Marcine Matar, MD  Patient coming from: Transfer from [facility]  Chief Complaint:  Chief Complaint  Patient presents with   Shortness of Breath   HPI: Linda Phelps is a 52 y.o. female with medical history significant of hypertension, hyperlipidemia, diastolic CHF, diabetes mellitus type 2, and obesity who presented with complaints of shortness of breath starting yesterday evening after she got off work.  She developed a "wet" cough with progressively worsening shortness of breath.  Normally she is on her feet and works as a Copy at [employer].  At baseline patient is not on oxygen.  Denied having any fever, chills, nausea, vomiting, calf pain, or leg swelling. Noted I  She notes losing weight a couple months ago while on Ozempic, but due to loss of insurance had gone back to taking metformin and glipizide.  She previously was switched to Ozempic due to worsening kidney function.  Patient makes note that she was scheduled to follow-up with nephrology in August.  She also reports adequate compliance with taking her medications.   In the emergency department patient was noted to have temperature of 99 F, pulse 70-110, blood pressures elevated up to 194/119, O2 saturations as low as 79% with improvement on high flow nasal cannula oxygen initially at 15 L.  Labs significant for BUN 40, creatinine 1.54, glucose 186, BNP 537, high-sensitivity troponin 92 >75.  Chest x-ray noted multifocal bilateral airspace opacities right greater than left.  Patient has been given a total of Lasix 100 mg IV, doxycycline, Rocephin, nitroglycerin, Zofran in the ED.  Patient has since been able to be weaned down to 5 L nasal cannula oxygen.  Review of Systems: As mentioned in the history of present illness. All other systems reviewed and are negative. 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   CHF (congestive heart failure) (HCC)    Chronic right-sided low back pain without sciatica 07/11/2016   Chronic systolic heart failure (HCC) 09/21/2015   Diabetes mellitus type 2 in obese 07/03/2014   Diabetes mellitus without complication (HCC)    Ectopic pregnancy    Essential hypertension 07/03/2014   Generalized anxiety disorder 07/26/2015   GERD (gastroesophageal reflux disease) 07/26/2015   High cholesterol    History of noncompliance with medical treatment 09/21/2015   Overview:  Per cardiologist notes from [facility]  Formatting of this note might be different from the original. Per cardiologist notes from [facility]   Hordeolum externum (stye) 01/11/2016   Hyperlipidemia 07/03/2014   Hypertension    Hypothyroidism 07/03/2014   Long-term use of aspirin therapy 09/21/2015   Low back pain    Menstrual cycle problem 01/11/2016   Obesity    Obesity (BMI 30.0-34.9) 10/01/2015   Palpitations 07/26/2015   Poor dentition 04/13/2015   Thyroid disease    Vitamin D insufficiency 01/13/2016   Past Surgical History:  Procedure Laterality Date   APPENDECTOMY     CHOLECYSTECTOMY     Social History:  reports that she has never smoked. She has never used smokeless tobacco. She reports current alcohol use. She reports that she does not use drugs.  Allergies  Allergen Reactions   Ibuprofen Swelling    Family History  Problem Relation Age of Onset   Diabetes Mother  Diabetes Maternal Grandmother    Colon cancer Maternal Uncle 65    Prior to Admission medications   Medication Sig Start Date End Date Taking? Authorizing Provider  amLODipine (NORVASC) 10 MG tablet Take 1 tablet by mouth once daily 03/10/22   Claiborne Rigg, NP  aspirin 81 MG tablet Take 1 tablet (81 mg total) by mouth daily. 10/17/17   Anders Simmonds, PA-C  atorvastatin  (LIPITOR) 10 MG tablet Take 1 tablet (10 mg total) by mouth daily. 09/26/21   Claiborne Rigg, NP  Blood Glucose Monitoring Suppl (TRUE METRIX METER) w/Device KIT Use to check blood sugar 3 times daily. 04/21/21   Marcine Matar, MD  Blood Pressure Monitor DEVI Use to check blood pressure daily 11/16/21   Marcine Matar, MD  carvedilol (COREG) 12.5 MG tablet TAKE 1 & 1/2 (ONE & ONE-HALF) TABLETS BY MOUTH TWICE DAILY WITH A MEAL 09/26/21   Claiborne Rigg, NP  cyclobenzaprine (FLEXERIL) 10 MG tablet Take 1 tablet (10 mg total) by mouth at bedtime. Patient not taking: Reported on 11/16/2021 09/26/21   Claiborne Rigg, NP  furosemide (LASIX) 20 MG tablet Take 1 tablet by mouth twice daily 06/02/22   Marcine Matar, MD  glipiZIDE (GLUCOTROL XL) 5 MG 24 hr tablet Take 1 tablet by mouth once daily with breakfast 03/10/22   Claiborne Rigg, NP  glucose blood (TRUE METRIX BLOOD GLUCOSE TEST) test strip Use to check blood sugar 3 times daily. Patient not taking: Reported on 02/14/2022 04/21/21   Marcine Matar, MD  levothyroxine (SYNTHROID) 50 MCG tablet Take 1 tablet by mouth once daily 03/10/22   Claiborne Rigg, NP  lisinopril (ZESTRIL) 40 MG tablet Take 1 tablet (40 mg total) by mouth daily. 02/14/22   Marcine Matar, MD  metFORMIN (GLUCOPHAGE) 1000 MG tablet Take 0.5 tablets (500 mg total) by mouth 2 (two) times daily with a meal. DOSE CHANGE 09/28/21   Claiborne Rigg, NP  pantoprazole (PROTONIX) 40 MG tablet Take 1 tablet (40 mg total) by mouth daily. 09/26/21   Claiborne Rigg, NP  triamcinolone (KENALOG) 0.025 % ointment Apply 1 application. topically 2 (two) times daily. 10/04/21   Claiborne Rigg, NP  TRUEplus Lancets 28G MISC Use to check blood sugar 3 times daily. 04/21/21   Marcine Matar, MD    Physical Exam: Vitals:   12/18/22 1015 12/18/22 1315 12/18/22 1430 12/18/22 1500  BP: 133/81 (!) 131/90 132/83 132/83  Pulse: 75 75 73 78  Resp: 18 19 (!) 23 14  Temp: 99 F (37.2  C) 99 F (37.2 C)  98.5 F (36.9 C)  TempSrc:    Oral  SpO2: 98% 98% 99%   Weight:      Height:       Constitutional: Middle-age female currently in no acute distress Eyes: PERRL, lids and conjunctivae normal ENMT: Mucous membranes are moist.   Neck: normal, supple  Respiratory: Normal respiratory effort with crackles appreciated in both lung fields.  Patient currently on 3 L nasal cannula oxygen with O2 saturations maintained. Cardiovascular: Regular rate and rhythm, no murmurs / rubs / gallops. No extremity edema. 2+ pedal pulses.  Abdomen: no tenderness, no masses palpated.  Bowel sounds positive.  Musculoskeletal: no clubbing / cyanosis. No joint deformity upper and lower extremities. Good ROM, no contractures. Normal muscle tone.  Skin: no rashes, lesions, ulcers. No induration Neurologic: CN 2-12 grossly intact. Sensation intact, DTR normal. Strength 5/5 in all  4.  Psychiatric: Normal judgment and insight. Alert and oriented x 3. Normal mood.   Data Reviewed:  Sinus tachycardia 105 bpm with STwave changes.  Reviewed labs, imaging, pertinent records as noted above in HPI.  Assessment and Plan:  Acute respiratory failure with hypoxia secondary to suspected pulmonary edema 2/2 Diastolic CHF exacerbation Patient presented with complaints of shortness of breath.  O2 saturations reported to be around 79% on room air.  Initially placed on high flow nasal cannula oxygen but able to be weaned down to 5 L nasal cannula oxygen.  Chest x-ray noted multifocal opacities right greater than left.  BNP elevated at 537.4.  Patient had initially been given Lasix 60 mg IV along with empiric antibiotics Rocephin and azithromycin.  Lower suspicion for infectious cause of symptoms at this time. -Admit to a progressive bed -Strict I&Os and daily weights -Incentive spirometry and flutter valve -Continuous pulse oximetry with oxygen maintain O2 saturation greater than 92% -Check procalcitonin -Check  echocardiogram. -Lasix 40 mg IV twice daily continue to monitor and continue to IV diuresis in a.m. -Consider formal consult to cardiology in a.m. if heart function noted to be worse.  Hypertensive emergency/urgency blood pressures initially elevated up to 194/119.  Patient noted to have concern for endorgan damage with elevated troponins and worsening kidney function. -Continue Coreg and amlodipine -Hydralazine IV as needed  Elevated troponin High-sensitivity troponin 92->75.  Patient denied any complaints of chest pain suspect secondary to demand. -Continue to monitor  Acute kidney injury superimposed on chronic kidney disease stage IV Creatinine noted to be elevated at 4.54 with BUN 40.  Baseline creatinine previously noted to be 2.7 back in 09/2021.  -Continue to monitor kidney function with IV diuresis  Diabetes mellitus type 2, without long-term use of insulin On admission glucose 185.  Last hemoglobin A1c was 6.7 back in 09/2021.  Home medication regimen includes metformin and glipizide. -Hypoglycemic protocols -Hold metformin and glipizide due to kidney function. -CBGs before every meal with sensitive SSI -Adjust regimen as needed  Hypothyroidism -Check TSH -Continue levothyroxine  Hyperlipidemia -Continue atorvastatin  Obesity 36.28 kg/m  DVT prophylaxis: Heparin Advance Care Planning:   Code Status: Full Code    Consults: Nephrology  Family Communication: mother   Severity of Illness: The appropriate patient status for this patient is INPATIENT. Inpatient status is judged to be reasonable and necessary in order to provide the required intensity of service to ensure the patient's safety. The patient's presenting symptoms, physical exam findings, and initial radiographic and laboratory data in the context of their chronic comorbidities is felt to place them at high risk for further clinical deterioration. Furthermore, it is not anticipated that the patient will be  medically stable for discharge from the hospital within 2 midnights of admission.   * I certify that at the point of admission it is my clinical judgment that the patient will require inpatient hospital care spanning beyond 2 midnights from the point of admission due to high intensity of service, high risk for further deterioration and high frequency of surveillance required.*  Author: Clydie Braun, MD 12/18/2022 4:37 PM  For on call review [email or web address].

## 2022-12-18 NOTE — ED Notes (Signed)
Patient placed on salter HFNC @ 5:05, Settings of FI02 60%, 15L. Patient oxygen saturation 87-88%. Patient O2 increased to 70% @ 15L patient oxygen saturation increased to 92%.Patient  oxygen saturation has increased to 94% with settings of 15L/70%, RR 16. Patient resting comfortably RR 20. Will continue to make changes as needed. Patient tolerated well.

## 2022-12-18 NOTE — ED Notes (Signed)
[facility] at bedside 

## 2022-12-18 NOTE — ED Triage Notes (Signed)
Pt reports cough that started around 2100 last night and "wheezing". Pt oxygen saturation 79% RA on arrival. Pt ambulatory and not appearing distressed. Denies fever, or chest pain. Does report some shortness of breath. Denies skipping any Lasix doses. RRT called to room, as well as EDP.

## 2022-12-18 NOTE — Progress Notes (Signed)
RT was able to wean patient off HFNC throughout my shift today. Was on 15L HFNC when I arrived, now currently on [facility]. No SOB noted. Stated she is feeling much better. RT to monitor as needed

## 2022-12-18 NOTE — ED Provider Notes (Signed)
[place] EMERGENCY DEPARTMENT AT [facility] Provider Note   CSN: [id number] Arrival date & time: 12/18/22  0450     History  Chief Complaint  Patient presents with   Shortness of Breath    Linda Phelps is a 52 y.o. female.  The history is provided by the patient and medical records.  Linda Phelps is a 52 y.o. female who presents to the Emergency Department complaining of shortness of breath.  She presents to the emergency department accompanied by her daughter for evaluation of shortness of breath.  She states that she felt under the weather on Saturday and then at 9 PM developed abrupt onset shortness of breath, cough.  Cough is nonproductive.  No associated chest pain, abdominal pain, fevers, nausea, vomiting.  She did have a sick contact at work last week but does not feel ill herself.  She does have a history of hypertension, diabetes, CHF.  She is compliant with all of her home medications and took them all last night.  No tobacco, alcohol, drug use.  No history of lung disease.  No history of DVT/PE.  No recent surgeries, long travel.  No hormone use. She does report that yesterday she ate more salty food than she felt she probably should have. Baseline blood pressures when she checks at home are 130s over 80s.    Home Medications Prior to Admission medications   Medication Sig Start Date End Date Taking? Authorizing Provider  amLODipine (NORVASC) 10 MG tablet Take 1 tablet by mouth once daily 03/10/22   Claiborne Rigg, NP  aspirin 81 MG tablet Take 1 tablet (81 mg total) by mouth daily. 10/17/17   Anders Simmonds, PA-C  atorvastatin (LIPITOR) 10 MG tablet Take 1 tablet (10 mg total) by mouth daily. 09/26/21   Claiborne Rigg, NP  Blood Glucose Monitoring Suppl (TRUE METRIX METER) w/Device KIT Use to check blood sugar 3 times daily. 04/21/21   Marcine Matar, MD  Blood Pressure Monitor DEVI Use to check blood pressure daily 11/16/21   Marcine Matar, MD   carvedilol (COREG) 12.5 MG tablet TAKE 1 & 1/2 (ONE & ONE-HALF) TABLETS BY MOUTH TWICE DAILY WITH A MEAL 09/26/21   Claiborne Rigg, NP  cyclobenzaprine (FLEXERIL) 10 MG tablet Take 1 tablet (10 mg total) by mouth at bedtime. Patient not taking: Reported on 11/16/2021 09/26/21   Claiborne Rigg, NP  furosemide (LASIX) 20 MG tablet Take 1 tablet by mouth twice daily 06/02/22   Marcine Matar, MD  glipiZIDE (GLUCOTROL XL) 5 MG 24 hr tablet Take 1 tablet by mouth once daily with breakfast 03/10/22   Claiborne Rigg, NP  glucose blood (TRUE METRIX BLOOD GLUCOSE TEST) test strip Use to check blood sugar 3 times daily. Patient not taking: Reported on 02/14/2022 04/21/21   Marcine Matar, MD  levothyroxine (SYNTHROID) 50 MCG tablet Take 1 tablet by mouth once daily 03/10/22   Claiborne Rigg, NP  lisinopril (ZESTRIL) 40 MG tablet Take 1 tablet (40 mg total) by mouth daily. 02/14/22   Marcine Matar, MD  metFORMIN (GLUCOPHAGE) 1000 MG tablet Take 0.5 tablets (500 mg total) by mouth 2 (two) times daily with a meal. DOSE CHANGE 09/28/21   Claiborne Rigg, NP  pantoprazole (PROTONIX) 40 MG tablet Take 1 tablet (40 mg total) by mouth daily. 09/26/21   Claiborne Rigg, NP  triamcinolone (KENALOG) 0.025 % ointment Apply 1 application. topically 2 (two) times daily. 10/04/21  Claiborne Rigg, NP  TRUEplus Lancets 28G MISC Use to check blood sugar 3 times daily. 04/21/21   Marcine Matar, MD      Allergies    Ibuprofen    Review of Systems   Review of Systems  All other systems reviewed and are negative.   Physical Exam Updated Vital Signs BP (!) 147/97   Pulse 87   Resp 17   Ht 5\' 2"  (1.575 m)   Wt 92.7 kg   LMP 10/08/2013   SpO2 96%   BMI 37.39 kg/m  Physical Exam Vitals and nursing note reviewed.  Constitutional:      Appearance: She is well-developed.  HENT:     Head: Normocephalic and atraumatic.  Cardiovascular:     Rate and Rhythm: Regular rhythm. Tachycardia present.      Heart sounds: No murmur heard. Pulmonary:     Comments: Tachypnea, diffuse crackles in all lung fields, greatest in the left lung fields Abdominal:     Palpations: Abdomen is soft.     Tenderness: There is no abdominal tenderness. There is no guarding or rebound.  Musculoskeletal:        General: No swelling or tenderness.  Skin:    General: Skin is warm and dry.  Neurological:     Mental Status: She is alert and oriented to person, place, and time.  Psychiatric:        Behavior: Behavior normal.     ED Results / Procedures / Treatments   Labs (all labs ordered are listed, but only abnormal results are displayed) Labs Reviewed  COMPREHENSIVE METABOLIC PANEL - Abnormal; Notable for the following components:      Result Value   CO2 20 (*)    Glucose, Bld 186 (*)    BUN 40 (*)    Creatinine, Ser 4.54 (*)    Calcium 8.7 (*)    AST 13 (*)    GFR, Estimated 11 (*)    All other components within normal limits  BRAIN NATRIURETIC PEPTIDE - Abnormal; Notable for the following components:   B Natriuretic Peptide 537.4 (*)    All other components within normal limits  TROPONIN I (HIGH SENSITIVITY) - Abnormal; Notable for the following components:   Troponin I (High Sensitivity) 92 (*)    All other components within normal limits  RESP PANEL BY RT-PCR (RSV, FLU A&B, COVID)  RVPGX2  CULTURE, BLOOD (ROUTINE X 2)  CULTURE, BLOOD (ROUTINE X 2)  CBC WITH DIFFERENTIAL/PLATELET  URINALYSIS, ROUTINE W REFLEX MICROSCOPIC  LACTIC ACID, PLASMA  LACTIC ACID, PLASMA  I-STAT VENOUS BLOOD GAS, ED  TROPONIN I (HIGH SENSITIVITY)    EKG EKG Interpretation Date/Time:  Monday December 18 2022 05:07:04 EDT Ventricular Rate:  105 PR Interval:  168 QRS Duration:  103 QT Interval:  353 QTC Calculation: 467 R Axis:   80  Text Interpretation: Sinus tachycardia Nonspecific T abnormalities, lateral leads Borderline ST elevation, lateral leads Confirmed by Tilden Fossa [phone]) on 12/18/2022 5:08:31  AM  Radiology DG Chest Port 1 View  Result Date: 12/18/2022 CLINICAL DATA:  Shortness of breath and wheezing. EXAM: PORTABLE CHEST 1 VIEW COMPARISON:  04/06/2018 FINDINGS: Mild cardiac enlargement. No pleural effusion or interstitial edema. Multifocal bilateral airspace opacities are identified, right greater than left. Visualized osseous structures are unremarkable. IMPRESSION: Multifocal bilateral airspace opacities, right greater than left, concerning for multifocal pneumonia. Electronically Signed   By: Signa Kell M.D.   On: 12/18/2022 06:08    Procedures Procedures  CRITICAL CARE Performed by: Tilden Fossa   Total critical care time: 45 minutes  Critical care time was exclusive of separately billable procedures and treating other patients.  Critical care was necessary to treat or prevent imminent or life-threatening deterioration.  Critical care was time spent personally by me on the following activities: development of treatment plan with patient and/or surrogate as well as nursing, discussions with consultants, evaluation of patient's response to treatment, examination of patient, obtaining history from patient or surrogate, ordering and performing treatments and interventions, ordering and review of laboratory studies, ordering and review of radiographic studies, pulse oximetry and re-evaluation of patient's condition.  Medications Ordered in ED Medications  nitroGLYCERIN 50 mg in dextrose 5 % 250 mL (0.2 mg/mL) infusion (0 mcg/min Intravenous Stopped 12/18/22 0528)  cefTRIAXone (ROCEPHIN) 1 g in sodium chloride 0.9 % 100 mL IVPB (has no administration in time range)  doxycycline (VIBRA-TABS) tablet 100 mg (has no administration in time range)  ondansetron (ZOFRAN) injection 4 mg (4 mg Intravenous Given 12/18/22 0524)  furosemide (LASIX) injection 60 mg (60 mg Intravenous Given 12/18/22 0609)  nitroGLYCERIN (NITROGLYN) 2 % ointment 1 inch (1 inch Topical Given 12/18/22 9562)     ED Course/ Medical Decision Making/ A&P                             Medical Decision Making Amount and/or Complexity of Data Reviewed Labs: ordered. Radiology: ordered.  Risk Prescription drug management. Decision regarding hospitalization.  Highflow 15/70 65  Patient with history of hypertension, diabetes, CHF here for evaluation of shortness of breath began abruptly around 9 PM.  She has been compliant with her home medications but did have some dietary indiscretions yesterday.  Patient presented with increased work of breathing, diffuse rales profound hypoxia.  She was significantly hypertensive on presentation as well and was started on high flow nasal cannula as well as nitroglycerin drip for rapid blood pressure control.  Patient had rapid response to nitro drip with dramatic drop in blood pressure and nausea.  She was treated with ondansetron and the nitro drip was discontinued.  Her symptoms rapidly improved in terms of nausea and her blood pressure also rapidly improved to 110s.  Labs significant for AKI, elevation in BNP as well as troponin.  EKG with LVH changes, which is similar when compared to priors.  She was treated with Nitropaste given her profound rapid drop in blood pressure with nitroglycerin drip.  Will treat with furosemide for diuresis.   Chest x-ray with bilateral infiltrates-radiology with concern for multifocal pneumonia.  Current clinical picture is not consistent with pneumonia.  Blood cultures and lactate were obtained and she was started on antibiotics in the event there is an underlying pneumonia although current clinical picture is consistent with acute pulmonary edema. Discussed with patient findings of studies and need for admission for ongoing care and she is in agreement with plan. Discussed with Dr. Haroldine Laws with the hospitalist service, plan to admit to stepdown unit for ongoing care.        Final Clinical Impression(s) / ED Diagnoses Final  diagnoses:  Acute pulmonary edema (HCC)  Acute respiratory failure with hypoxia (HCC)  Hypertensive urgency  AKI (acute kidney injury) [facility])    Rx / DC Orders ED Discharge Orders     None         Tilden Fossa, MD 12/18/22 0730

## 2022-12-19 ENCOUNTER — Inpatient Hospital Stay (HOSPITAL_COMMUNITY): Payer: No Typology Code available for payment source

## 2022-12-19 DIAGNOSIS — J9601 Acute respiratory failure with hypoxia: Secondary | ICD-10-CM | POA: Diagnosis not present

## 2022-12-19 DIAGNOSIS — I5033 Acute on chronic diastolic (congestive) heart failure: Secondary | ICD-10-CM | POA: Diagnosis not present

## 2022-12-19 DIAGNOSIS — R7989 Other specified abnormal findings of blood chemistry: Secondary | ICD-10-CM | POA: Diagnosis not present

## 2022-12-19 DIAGNOSIS — I160 Hypertensive urgency: Secondary | ICD-10-CM | POA: Diagnosis not present

## 2022-12-19 LAB — GLUCOSE, CAPILLARY
Glucose-Capillary: 162 mg/dL — ABNORMAL HIGH (ref 70–99)
Glucose-Capillary: 153 mg/dL — ABNORMAL HIGH (ref 70–99)
Glucose-Capillary: 157 mg/dL — ABNORMAL HIGH (ref 70–99)
Glucose-Capillary: 143 mg/dL — ABNORMAL HIGH (ref 70–99)

## 2022-12-19 MED ORDER — PERFLUTREN LIPID MICROSPHERE
1.0000 mL | INTRAVENOUS | Status: AC | PRN
  Administered 2022-12-19: 2 mL via INTRAVENOUS
  Filled 2022-12-19: qty 10

## 2022-12-19 NOTE — TOC Initial Note (Signed)
Transition of Care [facility]) - Initial/Assessment Note    Patient Details  Name: Linda Phelps MRN: [id number] Date of Birth: 1970/10/06  Transition of [facility]) CM/SW Contact:    Linda Haven, RN Phone Number: 12/19/2022, 5:48 PM  Clinical Narrative:                 From home with her daughter, she has PCP Linda Phelps at [facility] in [place], she has insurance on file, which is [employer] and she states she will have [employer] on December 21 2022.  She states she currently has no HH services in place at this time or DME at home.  She states her daughter Linda Phelps will be transporting her home or her other daghter, Linda Phelps.  She gets her medications from [place] [facility] [address] in [place], pta self ambulatory.   Expected Discharge Plan: Home/Self Care Barriers to Discharge: Continued Medical Work up   Patient Goals and CMS Choice Patient states their goals for this hospitalization and ongoing recovery are:: return home with daughters   Choice offered to / list presented to : NA      Expected Discharge Plan and Services In-house Referral: NA Discharge Planning Services: CM Consult Post Acute Care Choice: NA Living arrangements for the past 2 months: Single Family Home                 DME Arranged: N/A DME Agency: NA       HH Arranged: NA          Prior Living Arrangements/Services Living arrangements for the past 2 months: Single Family Home Lives with:: Adult Children Patient language and need for interpreter reviewed:: Yes Do you feel safe going back to the place where you live?: Yes      Need for Family Participation in Patient Care: Yes (Comment) Care giver support system in place?: Yes (comment)   Criminal Activity/Legal Involvement Pertinent to Current Situation/Hospitalization: No - Comment as needed  Activities of Daily Living Home Assistive Devices/Equipment: None ADL Screening (condition at time of admission) Patient's cognitive ability adequate to  safely complete daily activities?: Yes Is the patient deaf or have difficulty Phelps?: No Does the patient have difficulty seeing, even when wearing glasses/contacts?: No Does the patient have difficulty concentrating, remembering, or making decisions?: No Patient able to express need for assistance with ADLs?: Yes Does the patient have difficulty dressing or bathing?: Yes Independently performs ADLs?: Yes (appropriate for developmental age) Does the patient have difficulty walking or climbing stairs?: No Weakness of Legs: None Weakness of Arms/Hands: None  Permission Sought/Granted                  Emotional Assessment   Attitude/Demeanor/Rapport: Engaged Affect (typically observed): Appropriate Orientation: : Oriented to Self, Oriented to Place, Oriented to  Time Alcohol / Substance Use: Not Applicable Psych Involvement: No (comment)  Admission diagnosis:  Acute pulmonary edema (HCC) [J81.0] Hypertensive urgency [I16.0] Acute respiratory failure with hypoxia (HCC) [J96.01] AKI (acute kidney injury) (HCC) [N17.9] Patient Active Problem List   Diagnosis Date Noted   Acute respiratory failure with hypoxia (HCC) 12/18/2022   Acute on chronic diastolic CHF (congestive heart failure) (HCC) 12/18/2022   Hypertensive urgency 12/18/2022   Elevated troponin 12/18/2022   Acute kidney injury superimposed on chronic kidney disease (HCC) 12/18/2022   Thyroid disease    Obesity    Low back pain    Hypertension    High cholesterol    Ectopic pregnancy  Diabetes mellitus without complication (HCC)    CHF (congestive heart failure) (HCC)    Atypical chest pain 11/18/2017   Chronic right-sided low back pain without sciatica 07/11/2016   Vitamin D insufficiency 01/13/2016   Menstrual cycle problem 01/11/2016   Hordeolum externum (stye) 01/11/2016   Obesity (BMI 30.0-34.9) 10/01/2015   Chronic systolic heart failure (HCC) 09/21/2015   History of noncompliance with medical  treatment 09/21/2015   Long-term use of aspirin therapy 09/21/2015   Cardiomyopathy, peripartum, delivered 09/21/2015   Palpitations 07/26/2015   GERD (gastroesophageal reflux disease) 07/26/2015   Generalized anxiety disorder 07/26/2015   Poor dentition 04/13/2015   Cardiomegaly 09/23/2014   Type 2 diabetes mellitus with obesity (HCC) 07/03/2014   Essential hypertension 07/03/2014   Hyperlipidemia 07/03/2014   Hypothyroidism 07/03/2014   PCP:  Linda Matar, MD Pharmacy:   [id number] - [place], [place] - [address] [place] [address] Phone: [phone] Fax: [phone]  [facility] - [place], [place] - [address] [place] [address] Phone: [phone] Fax: [phone]     Social Determinants of Health (SDOH) Social History: SDOH Screenings   Food Insecurity: No Food Insecurity (12/18/2022)  Housing: Low Risk  (12/18/2022)  Transportation Needs: No Transportation Needs (12/18/2022)  Utilities: Not At Risk (12/18/2022)  Depression (PHQ2-9): Low Risk  (09/26/2021)  Tobacco Use: Low Risk  (12/18/2022)   SDOH Interventions:     Readmission Risk Interventions     No data to display

## 2022-12-19 NOTE — Progress Notes (Signed)
[facility] Progress Note   Assessment/ Plan:   Assessment/Plan: Renal failure: difficult to determine how much of an acute component there is as she certainly has had significant CKD with a cr that was 2.73 09/26/21 and already seen by a nephrologist a year ago during a hospitalization. Last labs were ~19mths ago at her PMD Dr. Suzzanne Cloud at the [facility] location [phone]. 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 She has had a very good response to diuresis with with relative lower dose Lasix. Metformin and Lisinopril already on hold appropriately. - Renal ultrasound negative - Urine sediment is inactive. - good diuresis with IV Lasix- already got IV this AM, will transition to PO tomorrow - sees nephrology in [place] - continue to monitor- if Cr stable tomorrow likely can d/c   -Monitor Daily I/Os, Daily weight  - Avoid nephrotoxic agents such as IV contrast, NSAIDs, and phosphate containing bowel preps (FLEETS)   Hypertension: restarted on home regimen and BP already improved from initial readings from the ED. Would not drop more than 30% 1st 24hrs to maintain vital organ perfusion. DM: per primary Hypothyroidism Obesity    Subjective:    Seen in room.  Off O2, feels that she is doing much better.  Legs down.   Objective:   BP 136/67 (BP Location: Left Arm)   Pulse 88   Temp 97.7 F (36.5 C) (Oral)   Resp 16   Ht 5\' 1"  (1.549 m)   Wt 87.2 kg   LMP 10/08/2013   SpO2 99%   BMI 36.33 kg/m   Intake/Output Summary (Last 24 hours) at 12/19/2022 0910 Last data filed at 12/19/2022 9147 Gross per 24 hour  Intake 242.44 ml  Output 2200 ml  Net -1957.56 ml   Weight change: -5.615 kg  Physical Exam: WGN:FAOZH flat in bed, NAD CVS: RRR Resp: clear Abd: soft Ext: no LE edema  Imaging: US RENAL  Result Date: 12/19/2022 CLINICAL DATA:  Acute renal failure.  EXAM: RENAL / URINARY TRACT ULTRASOUND COMPLETE COMPARISON:  None Available. FINDINGS: Right Kidney: Renal measurements: 6.2 x 2.8 x 3.9 cm = volume: 35.7 mL. Increased parenchymal echogenicity. A cyst is present in the midpole measuring 2.0 x 2.1 x 1.9 cm. No mass or hydronephrosis visualized. Left Kidney: Renal measurements: 8.1 x 4.8 x 3.6 cm = volume: 74.1 mL. Increased parenchymal echogenicity. No mass or hydronephrosis visualized. Bladder: Appears normal for degree of bladder distention. Bilateral ureteral jets are present. Other: Examination is limited due to patient's body habitus. IMPRESSION: 1. Atrophic kidneys bilaterally with increased renal parenchymal echogenicity, compatible with medical renal disease. 2. Right renal cyst. Electronically Signed   By: Thornell Sartorius M.D.   On: 12/19/2022 03:37   DG Chest Port 1 View  Result Date: 12/18/2022 CLINICAL DATA:  Shortness of breath and wheezing. EXAM: PORTABLE CHEST 1 VIEW COMPARISON:  04/06/2018 FINDINGS: Mild cardiac enlargement. No pleural effusion or interstitial edema. Multifocal bilateral airspace opacities are identified, right greater than left. Visualized osseous structures are unremarkable. IMPRESSION: Multifocal bilateral airspace opacities, right greater than left, concerning for multifocal pneumonia. Electronically Signed   By: Signa Kell M.D.   On: 12/18/2022 06:08    Labs: BMET Recent Labs  Lab 12/18/22 0502 12/18/22 0653 12/19/22 0224  NA 139 139 136  K 4.7 5.1 4.4  CL 110  --  106  CO2 20*  --  21*  GLUCOSE 186*  --  148*  BUN 40*  --  44*  CREATININE 4.54*  --  5.17*  CALCIUM 8.7*  --  8.6*   CBC Recent Labs  Lab 12/18/22 0502 12/18/22 0653 12/19/22 0224  WBC 7.1  --  6.7  NEUTROABS 2.4  --   --   HGB 12.3 11.6* 10.8*  HCT 37.1 34.0* 33.7*  MCV 90.3  --  90.8  PLT 343  --  313    Medications:     amLODipine  10 mg Oral Daily   aspirin EC  81 mg Oral Daily   atorvastatin  10 mg Oral Daily    carvedilol  12.5 mg Oral BID WC   heparin  5,000 Units Subcutaneous Q8H   insulin aspart  0-5 Units Subcutaneous QHS   insulin aspart  0-9 Units Subcutaneous TID WC   levothyroxine  50 mcg Oral Daily    Bufford Buttner MD 12/19/2022, 9:10 AM

## 2022-12-19 NOTE — Progress Notes (Signed)
PROGRESS NOTE    Linda Phelps  WUJ:[id number] DOB: Feb 25, 1971 DOA: 12/18/2022 PCP: Marcine Matar, MD    Brief Narrative:   Linda Phelps is a 52 y.o. female with medical history significant of hypertension, hyperlipidemia, diastolic CHF, diabetes mellitus type 2, obesity presented to the hospital with complaints of shortness of breath,"wet" cough. She notes losing weight a couple months ago while on Ozempic, but due to loss of insurance had gone back to taking metformin and glipizide.   Patient makes note that she was scheduled to follow-up with nephrology in August.  She also reports adequate compliance with taking her medications. In the ED, BP was elevated at 194/119, O2 saturations as low as 79% with improvement on high flow nasal cannula oxygen initially at 15 L.  Labs significant for BUN 40, creatinine 1.54, glucose 186, BNP 537, high-sensitivity troponin 92 >75.  Chest x-ray noted multifocal bilateral airspace opacities right greater than left.  Patient received Lasix 100 mg IV, doxycycline, Rocephin, nitroglycerin, Zofran in the ED, weaned to 5 L of oxygen and was admitted to the hospital for further evaluation and treatment.  Assessment and Plan:   Acute respiratory failure with hypoxia secondary to suspected pulmonary edema secondary to diastolic CHF exacerbation Initial pulse ox of 79% on room air.  Initially placed on high flow nasal cannula at 15 L but now on 2 L.   Chest x-ray noted multifocal opacities right greater than left.  BNP elevated at 537. Continue Strict I&Os and daily weights. Incentive spirometry and flutter valve. Check  2 D echo. On lasix 40 mg IV twice daily.  Nephrology following   Hypertensive emergency/urgency  Initial BP of 194/119 with elevated troponins and worsening kidney function. Continue Coreg and amlodipine.  Was initially on nitro drip which has been discontinued. Hydralazine IV as needed.  Latest blood pressure of 129/72   Elevated  troponin High-sensitivity troponin 92->75.  No chest pain.    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  Baseline creatinine previously noted to be 2.7 back in 09/2021. Creatinine today at 5.1. Output of 2850 ml noted. Continue to monitor renal function.  On IV lasix at this time.  Renal ultrasound with atrophic kidneys bilaterally with increased parenchymal echogenicity.  Follow nephrology recommendation   Diabetes mellitus type 2, without long-term use of insulin Patient is on metformin and glipizide as outpatient.  Continue sliding scale insulin,Diabetic diet, Accu-Cheks.  Hypothyroidism Continue Synthroid.  TSH of 2.5.   Hyperlipidemia Continue Lipitor   Obesity 36.28 kg/m.  Patient would benefit from weight loss as outpatient.   DVT prophylaxis: heparin injection 5,000 Units Start: 12/18/22 1745   Code Status:     Code Status: Full Code  Disposition: Likely home in 2 to 3 days Status is: Inpatient  Remains inpatient appropriate because: Worsening renal failure IV diuretic,   Family Communication: None at bedside  Consultants:  Nephrology  Procedures:  None  Antimicrobials:  None  Anti-infectives (From admission, onward)    Start     Dose/Rate Route Frequency Ordered Stop   12/18/22 0630  doxycycline (VIBRA-TABS) tablet 100 mg        100 mg Oral  Once 12/18/22 0615 12/18/22 0644   12/18/22 0615  cefTRIAXone (ROCEPHIN) 1 g in sodium chloride 0.9 % 100 mL IVPB        1 g 200 mL/hr over 30 Minutes Intravenous  Once 12/18/22 0615 12/18/22 9562  Subjective: Today, patient was seen and examined at bedside.  Patient states that she feels a better with breathing and was able to lie in the bed.  She did walk some.  Denies any chest pain, dizziness, palpitations.  Objective: Vitals:   12/18/22 1929 12/19/22 0003 12/19/22 0546 12/19/22 1140  BP: (!) 146/90 132/80 136/67 129/72  Pulse: 88  88 80   Resp: 18 16 16 18   Temp: 98.4 F (36.9 C) 98.2 F (36.8 C) 97.7 F (36.5 C) 97.7 F (36.5 C)  TempSrc: Oral Oral Oral Oral  SpO2: 97%  99% 96%  Weight:   87.2 kg   Height:        Intake/Output Summary (Last 24 hours) at 12/19/2022 1248 Last data filed at 12/19/2022 0552 Gross per 24 hour  Intake 242.44 ml  Output 2200 ml  Net -1957.56 ml   Filed Weights   12/18/22 0502 12/18/22 1643 12/19/22 0546  Weight: 92.7 kg 87.1 kg 87.2 kg    Physical Examination: Body mass index is 36.33 kg/m.  General: Obese built, not in obvious distress, on room air HENT:   No scleral pallor or icterus noted. Oral mucosa is moist.  Chest:  Clear breath sounds.   No crackles or wheezes.  CVS: S1 &S2 heard. No murmur.  Regular rate and rhythm. Abdomen: Soft, nontender, nondistended.  Bowel sounds are heard.   Extremities: No cyanosis, clubbing with trace edema  Psych: Alert, awake and oriented, normal mood CNS:  No cranial nerve deficits.  Power equal in all extremities.   Skin: Warm and dry.  No rashes noted.  Data Reviewed:   CBC: Recent Labs  Lab 12/18/22 0502 12/18/22 0653 12/19/22 0224  WBC 7.1  --  6.7  NEUTROABS 2.4  --   --   HGB 12.3 11.6* 10.8*  HCT 37.1 34.0* 33.7*  MCV 90.3  --  90.8  PLT 343  --  313    Basic Metabolic Panel: Recent Labs  Lab 12/18/22 0502 12/18/22 0653 12/19/22 0224  NA 139 139 136  K 4.7 5.1 4.4  CL 110  --  106  CO2 20*  --  21*  GLUCOSE 186*  --  148*  BUN 40*  --  44*  CREATININE 4.54*  --  5.17*  CALCIUM 8.7*  --  8.6*    Liver Function Tests: Recent Labs  Lab 12/18/22 0502  AST 13*  ALT 9  ALKPHOS 96  BILITOT 0.5  PROT 7.8  ALBUMIN 3.6     Radiology Studies: US RENAL  Result Date: 12/19/2022 CLINICAL DATA:  Acute renal failure. EXAM: RENAL / URINARY TRACT ULTRASOUND COMPLETE COMPARISON:  None Available. FINDINGS: Right Kidney: Renal measurements: 6.2 x 2.8 x 3.9 cm = volume: 35.7 mL. Increased parenchymal echogenicity. A  cyst is present in the midpole measuring 2.0 x 2.1 x 1.9 cm. No mass or hydronephrosis visualized. Left Kidney: Renal measurements: 8.1 x 4.8 x 3.6 cm = volume: 74.1 mL. Increased parenchymal echogenicity. No mass or hydronephrosis visualized. Bladder: Appears normal for degree of bladder distention. Bilateral ureteral jets are present. Other: Examination is limited due to patient's body habitus. IMPRESSION: 1. Atrophic kidneys bilaterally with increased renal parenchymal echogenicity, compatible with medical renal disease. 2. Right renal cyst. Electronically Signed   By: Thornell Sartorius M.D.   On: 12/19/2022 03:37   DG Chest Port 1 View  Result Date: 12/18/2022 CLINICAL DATA:  Shortness of breath and wheezing. EXAM: PORTABLE CHEST 1 VIEW COMPARISON:  04/06/2018  FINDINGS: Mild cardiac enlargement. No pleural effusion or interstitial edema. Multifocal bilateral airspace opacities are identified, right greater than left. Visualized osseous structures are unremarkable. IMPRESSION: Multifocal bilateral airspace opacities, right greater than left, concerning for multifocal pneumonia. Electronically Signed   By: Signa Kell M.D.   On: 12/18/2022 06:08      LOS: 1 day    Joycelyn Das, MD [facility] Available via Epic secure chat 7am-7pm After these hours, please refer to coverage provider listed on [email or web address] 12/19/2022, 12:48 PM

## 2022-12-20 DIAGNOSIS — I5021 Acute systolic (congestive) heart failure: Secondary | ICD-10-CM

## 2022-12-20 DIAGNOSIS — I10 Essential (primary) hypertension: Secondary | ICD-10-CM | POA: Diagnosis not present

## 2022-12-20 LAB — GLUCOSE, CAPILLARY
Glucose-Capillary: 122 mg/dL — ABNORMAL HIGH (ref 70–99)
Glucose-Capillary: 142 mg/dL — ABNORMAL HIGH (ref 70–99)
Glucose-Capillary: 153 mg/dL — ABNORMAL HIGH (ref 70–99)
Glucose-Capillary: 168 mg/dL — ABNORMAL HIGH (ref 70–99)

## 2022-12-20 MED ORDER — FUROSEMIDE 20 MG PO TABS
40.0000 mg | ORAL_TABLET | Freq: Every day | ORAL | Status: DC
Start: 2022-12-20 — End: 2023-02-02

## 2022-12-20 MED ORDER — AMLODIPINE BESYLATE 5 MG PO TABS
5.0000 mg | ORAL_TABLET | Freq: Every day | ORAL | Status: DC
  Administered 2022-12-21 – 2022-12-22 (×2): 5 mg via ORAL
  Filled 2022-12-20 (×2): qty 1

## 2022-12-20 NOTE — Discharge Summary (Addendum)
Physician Discharge Summary  Linda Phelps WUJ:[id number] DOB: 1971-04-17 DOA: 12/18/2022  PCP: Marcine Matar, MD  Admit date: 12/18/2022 Discharge date: 12/22/2022  Admitted From: Home  Discharge disposition: Home  Recommendations for Outpatient Follow-Up:   Follow up with your primary care provider in 1 to 2 weeks. Check CBC, BMP, magnesium in the next visit Follow-up with the nephrology at the [facility]  in one week. Lisinopril and metformin on hold at this time due to renal failure.  Will need to be restarted as appropriate. Follow-up with cardiology on 01/08/2023 at advanced heart failure clinic.  Discharge Diagnosis:   Principal Problem:   Acute respiratory failure with hypoxia (HCC) Active Problems:   Systolic CHF, acute (HCC)   Hypertensive urgency   Elevated troponin   Acute kidney injury superimposed on chronic kidney disease (HCC)   Type 2 diabetes mellitus with obesity (HCC)   Hypothyroidism   Hyperlipidemia   Discharge Condition: Improved.  Diet recommendation: Low sodium, heart healthy.  Carbohydrate-modified.   Wound care: None.  Code status: Full.   History of Present Illness:   Linda Phelps is a 52 y.o. female with medical history significant of hypertension, hyperlipidemia, diastolic CHF, diabetes mellitus type 2, obesity presented to the hospital with complaints of shortness of breath,"wet" cough. She notes losing weight a couple months ago while on Ozempic, but due to loss of insurance had gone back to taking metformin and glipizide.   Patient makes note that she was scheduled to follow-up with nephrology in August.  She also reports adequate compliance with taking her medications. In the ED, BP was elevated at 194/119, O2 saturations as low as 79% with improvement on high flow nasal cannula oxygen initially at 15 L.  Labs significant for BUN 40, creatinine 1.54, glucose 186, BNP 537, high-sensitivity troponin 92 >75.  Chest x-ray noted multifocal  bilateral airspace opacities right greater than left.  Patient received Lasix 100 mg IV, doxycycline, Rocephin, nitroglycerin, Zofran in the ED, weaned to 5 L of oxygen and was admitted to the hospital for further evaluation and treatment.   Hospital Course:   Following conditions were addressed during hospitalization as listed below,  Acute respiratory failure with hypoxia secondary to suspected pulmonary edema secondary to systolic CHF exacerbation Resolved at this time.  Initial pulse ox of 79% on room air.  Initially placed on high flow nasal cannula at 15 L and was subsequently weaned to room air.  Chest x-ray noted multifocal opacities right greater than left.  BNP elevated at 537.  2D echocardiogram showed LV ejection fraction of 20 to 25% with global hypokinesis.  Patient will be continued on Lasix 40 daily.  Nephrology has seen the patient and will need outpatient nephrology follow-up   Hypertensive emergency/urgency  Initial BP of 194/119 with elevated troponins with worsened kidney function. Continue Coreg and amlodipine on discharge.  Hold off with lisinopril due to worsening renal failure.  Was initially on nitro drip which has been discontinued.  Patient has been started on BiDil with improvement in blood pressure.  Patient will need to follow-up with cardiology as outpatient.   Elevated troponin High-sensitivity troponin 92->75.  No chest pain. Echo with low EF. Will need to follow up with cardiology as outpatient.  Unable to use ACE inhibitor or ARB or Entresto at this time.  Patient appears to be fully compensated.    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  Baseline creatinine  previously noted to be 2.7 back in 09/2021. Creatinine today at low 5.6..   Renal ultrasound with atrophic kidneys bilaterally with increased parenchymal echogenicity.  Has been started on oral Lasix 40 daily.  Will need to follow-up with nephrology as  outpatient.   Diabetes mellitus type 2, without long-term use of insulin Patient is on metformin and glipizide as outpatient.  Will discontinue metformin until renal function improves.  Diabetic diet.  Hypothyroidism Continue Synthroid.  TSH of 2.5.   Hyperlipidemia Continue Lipitor   Obesity BMI of 36.28 kg/m.  Patient would benefit from weight loss as outpatient.  Disposition.  At this time, patient is stable for disposition home with outpatient PCP, cardiology and nephrology follow-up.  Medical Consultants:   Nephrology  Procedures:    None Subjective:   Today, patient was seen and examined at bedside.  Patient denies any chest pain, shortness of breath, dizziness, lightheadedness fever or chills.  Wishes to go home.  Feels okay.   Discharge Exam:   Vitals:   12/22/22 0742 12/22/22 0849  BP: 109/61 114/70  Pulse: 76 80  Resp: 18   Temp: 98.3 F (36.8 C)   SpO2: 99%    Vitals:   12/21/22 2356 12/22/22 0455 12/22/22 0742 12/22/22 0849  BP: (!) 109/58 109/64 109/61 114/70  Pulse: 80 79 76 80  Resp: 18 18 18    Temp: 97.9 F (36.6 C) 98 F (36.7 C) 98.3 F (36.8 C)   TempSrc: Oral Oral Oral   SpO2: 98% 100% 99%   Weight:  87 kg    Height:       Body mass index is 36.26 kg/m.   General: Alert awake, not in obvious distress, obese HENT: pupils equally reacting to light,  No scleral pallor or icterus noted. Oral mucosa is moist.  Chest:  Clear breath sounds.  No crackles or wheezes.  CVS: S1 &S2 heard. No murmur.  Regular rate and rhythm. Abdomen: Soft, nontender, nondistended.  Bowel sounds are heard.   Extremities: No cyanosis, clubbing or edema.  Peripheral pulses are palpable. Psych: Alert, awake and oriented, normal mood CNS:  No cranial nerve deficits.  Power equal in all extremities.   Skin: Warm and dry.  No rashes noted.  The results of significant diagnostics from this hospitalization (including imaging, microbiology, ancillary and laboratory)  are listed below for reference.     Diagnostic Studies:   ECHOCARDIOGRAM COMPLETE  Result Date: 12/19/2022    ECHOCARDIOGRAM REPORT   Patient Name:   Linda Phelps Date of Exam: 12/19/2022 Medical Rec #:  [id number]   Height:       61.0 in Accession #:    [id number]  Weight:       192.3 lb Date of Birth:  1970/09/27   BSA:          1.858 m Patient Age:    52 years    BP:           136/67 mmHg Patient Gender: F           HR:           89 bpm. Exam Location:  Inpatient Procedure: 2D Echo, Intracardiac Opacification Agent, Color Doppler and Cardiac            Doppler Indications:    CHF  History:        Patient has prior history of Echocardiogram examinations, most  recent 06/17/2019. CHF, Cardiomegaly and Cardiomyopathy,                 Signs/Symptoms:Chest Pain; Risk Factors:Hypertension and                 Diabetes.  Sonographer:    Darlys Gales Referring Phys: [phone] DEBBY CROSLEY IMPRESSIONS  1. Left ventricular ejection fraction, by estimation, is 20 to 25%. The left ventricle has severely decreased function. The left ventricle demonstrates global hypokinesis. The left ventricular internal cavity size was moderately dilated. Left ventricular diastolic parameters are consistent with Grade II diastolic dysfunction (pseudonormalization).  2. Right ventricular systolic function is normal. The right ventricular size is normal. Tricuspid regurgitation signal is inadequate for assessing PA pressure.  3. Left atrial size was mildly dilated.  4. The mitral valve is normal in structure. Trivial mitral valve regurgitation. No evidence of mitral stenosis.  5. The aortic valve is tricuspid. Aortic valve regurgitation is not visualized. No aortic stenosis is present.  6. The inferior vena cava is normal in size with greater than 50% respiratory variability, suggesting right atrial pressure of 3 mmHg. FINDINGS  Left Ventricle: Left ventricular ejection fraction, by estimation, is 20 to 25%. The left ventricle has  severely decreased function. The left ventricle demonstrates global hypokinesis. The left ventricular internal cavity size was moderately dilated. There is no left ventricular hypertrophy. Left ventricular diastolic parameters are consistent with Grade II diastolic dysfunction (pseudonormalization). Right Ventricle: The right ventricular size is normal. No increase in right ventricular wall thickness. Right ventricular systolic function is normal. Tricuspid regurgitation signal is inadequate for assessing PA pressure. Left [facility]: Left atrial size was mildly dilated. Right [facility]: Right atrial size was normal in size. Pericardium: Trivial pericardial effusion is present. Mitral Valve: The mitral valve is normal in structure. There is mild calcification of the mitral valve leaflet(s). Trivial mitral valve regurgitation. No evidence of mitral valve stenosis. Tricuspid Valve: The tricuspid valve is normal in structure. Tricuspid valve regurgitation is not demonstrated. Aortic Valve: The aortic valve is tricuspid. Aortic valve regurgitation is not visualized. No aortic stenosis is present. Aortic valve mean gradient measures 3.0 mmHg. Aortic valve peak gradient measures 6.0 mmHg. Aortic valve area, by VTI measures 1.86 cm. Pulmonic Valve: The pulmonic valve was normal in structure. Pulmonic valve regurgitation is trivial. Aorta: The aortic root is normal in size and structure. Venous: The inferior vena cava is normal in size with greater than 50% respiratory variability, suggesting right atrial pressure of 3 mmHg. IAS/Shunts: No atrial level shunt detected by color flow Doppler.  LEFT VENTRICLE PLAX 2D LVIDd:         6.50 cm LVIDs:         5.40 cm LV PW:         1.10 cm LV IVS:        0.70 cm LVOT diam:     1.80 cm LV SV:         39 LV SV Index:   21 LVOT Area:     2.54 cm  RIGHT VENTRICLE RV S prime:     15.90 cm/s TAPSE (M-mode): 1.9 cm LEFT ATRIUM             Index        RIGHT ATRIUM          Index LA Vol (A2C):    54.7 ml 29.45 ml/m  RA Area:     9.20 cm LA Vol (A4C):   64.0 ml 34.45  ml/m  RA Volume:   15.50 ml 8.34 ml/m LA Biplane Vol: 64.5 ml 34.72 ml/m  AORTIC VALVE AV Area (Vmax):    1.95 cm AV Area (Vmean):   1.90 cm AV Area (VTI):     1.86 cm AV Vmax:           122.00 cm/s AV Vmean:          86.400 cm/s AV VTI:            0.209 m AV Peak Grad:      6.0 mmHg AV Mean Grad:      3.0 mmHg LVOT Vmax:         93.40 cm/s LVOT Vmean:        64.400 cm/s LVOT VTI:          0.153 m LVOT/AV VTI ratio: 0.73  AORTA Ao Root diam: 2.30 cm MITRAL VALVE MV Area (PHT): 5.42 cm    SHUNTS MV Decel Time: 140 msec    Systemic VTI:  0.15 m MV E velocity: 85.20 cm/s  Systemic Diam: 1.80 cm MV A velocity: 73.10 cm/s MV E/A ratio:  1.17 Dalton McleanMD Electronically signed by Wilfred Lacy Signature Date/Time: 12/19/2022/5:46:55 PM    Final    US RENAL  Result Date: 12/19/2022 CLINICAL DATA:  Acute renal failure. EXAM: RENAL / URINARY TRACT ULTRASOUND COMPLETE COMPARISON:  None Available. FINDINGS: Right Kidney: Renal measurements: 6.2 x 2.8 x 3.9 cm = volume: 35.7 mL. Increased parenchymal echogenicity. A cyst is present in the midpole measuring 2.0 x 2.1 x 1.9 cm. No mass or hydronephrosis visualized. Left Kidney: Renal measurements: 8.1 x 4.8 x 3.6 cm = volume: 74.1 mL. Increased parenchymal echogenicity. No mass or hydronephrosis visualized. Bladder: Appears normal for degree of bladder distention. Bilateral ureteral jets are present. Other: Examination is limited due to patient's body habitus. IMPRESSION: 1. Atrophic kidneys bilaterally with increased renal parenchymal echogenicity, compatible with medical renal disease. 2. Right renal cyst. Electronically Signed   By: Thornell Sartorius M.D.   On: 12/19/2022 03:37   DG Chest Port 1 View  Result Date: 12/18/2022 CLINICAL DATA:  Shortness of breath and wheezing. EXAM: PORTABLE CHEST 1 VIEW COMPARISON:  04/06/2018 FINDINGS: Mild cardiac enlargement. No pleural effusion or  interstitial edema. Multifocal bilateral airspace opacities are identified, right greater than left. Visualized osseous structures are unremarkable. IMPRESSION: Multifocal bilateral airspace opacities, right greater than left, concerning for multifocal pneumonia. Electronically Signed   By: Signa Kell M.D.   On: 12/18/2022 06:08     Labs:   Basic Metabolic Panel: Recent Labs  Lab 12/18/22 0502 12/18/22 0653 12/19/22 0224 12/20/22 0052 12/21/22 0250 12/22/22 0150  NA 139 139 136 138 135 136  K 4.7 5.1 4.4 4.5 4.7 4.5  CL 110  --  106 105 104 105  CO2 20*  --  21* 23 21* 22  GLUCOSE 186*  --  148* 142* 122* 135*  BUN 40*  --  44* 53* 51* 60*  CREATININE 4.54*  --  5.17* 5.39* 5.37* 5.68*  CALCIUM 8.7*  --  8.6* 8.5* 8.5* 8.2*  MG  --   --   --  2.1 2.2  --    GFR Estimated Creatinine Clearance: 11.4 mL/min (A) (by C-G formula based on SCr of 5.68 mg/dL (H)). Liver Function Tests: Recent Labs  Lab 12/18/22 0502  AST 13*  ALT 9  ALKPHOS 96  BILITOT 0.5  PROT 7.8  ALBUMIN 3.6   No results  for input(s): "LIPASE", "AMYLASE" in the last 168 hours. No results for input(s): "AMMONIA" in the last 168 hours. Coagulation profile No results for input(s): "INR", "PROTIME" in the last 168 hours.  CBC: Recent Labs  Lab 12/18/22 0502 12/18/22 0653 12/19/22 0224 12/20/22 0052 12/21/22 0250 12/22/22 0150  WBC 7.1  --  6.7 7.0 6.7 7.2  NEUTROABS 2.4  --   --   --   --   --   HGB 12.3 11.6* 10.8* 11.0* 11.0* 10.5*  HCT 37.1 34.0* 33.7* 34.9* 34.4* 32.3*  MCV 90.3  --  90.8 91.4 91.0 92.3  PLT 343  --  313 295 286 296   Cardiac Enzymes: No results for input(s): "CKTOTAL", "CKMB", "CKMBINDEX", "TROPONINI" in the last 168 hours. BNP: Invalid input(s): "POCBNP" CBG: Recent Labs  Lab 12/21/22 1149 12/21/22 1555 12/21/22 2122 12/22/22 0601 12/22/22 1124  GLUCAP 147* 118* 168* 134* 165*   D-Dimer No results for input(s): "DDIMER" in the last 72 hours. Hgb A1c No  results for input(s): "HGBA1C" in the last 72 hours. Lipid Profile No results for input(s): "CHOL", "HDL", "LDLCALC", "TRIG", "CHOLHDL", "LDLDIRECT" in the last 72 hours. Thyroid function studies No results for input(s): "TSH", "T4TOTAL", "T3FREE", "THYROIDAB" in the last 72 hours.  Invalid input(s): "FREET3"  Anemia work up No results for input(s): "VITAMINB12", "FOLATE", "FERRITIN", "TIBC", "IRON", "RETICCTPCT" in the last 72 hours. Microbiology Recent Results (from the past 240 hour(s))  Resp panel by RT-PCR (RSV, Flu A&B, Covid) Anterior Nasal Swab     Status: None   Collection Time: 12/18/22  5:05 AM   Specimen: Anterior Nasal Swab  Result Value Ref Range Status   SARS Coronavirus 2 by RT PCR NEGATIVE NEGATIVE Final    Comment: (NOTE) SARS-CoV-2 target nucleic acids are NOT DETECTED.  The SARS-CoV-2 RNA is generally detectable in upper respiratory specimens during the acute phase of infection. The lowest concentration of SARS-CoV-2 viral copies this assay can detect is 138 copies/mL. A negative result does not preclude SARS-Cov-2 infection and should not be used as the sole basis for treatment or other patient management decisions. A negative result may occur with  improper specimen collection/handling, submission of specimen other than nasopharyngeal swab, presence of viral mutation(s) within the areas targeted by this assay, and inadequate number of viral copies(<138 copies/mL). A negative result must be combined with clinical observations, patient history, and epidemiological information. The expected result is Negative.  Fact Sheet for Patients:  BloggerCourse.com  Fact Sheet for Healthcare Providers:  SeriousBroker.it  This test is no t yet approved or cleared by the [place] FDA and  has been authorized for detection and/or diagnosis of SARS-CoV-2 by FDA under an Emergency Use Authorization (EUA). This EUA will  remain  in effect (meaning this test can be used) for the duration of the COVID-19 declaration under Section 564(b)(1) of the Act, 21 U.S.C.section 360bbb-3(b)(1), unless the authorization is terminated  or revoked sooner.       Influenza A by PCR NEGATIVE NEGATIVE Final   Influenza B by PCR NEGATIVE NEGATIVE Final    Comment: (NOTE) The Xpert Xpress SARS-CoV-2/FLU/RSV plus assay is intended as an aid in the diagnosis of influenza from Nasopharyngeal swab specimens and should not be used as a sole basis for treatment. Nasal washings and aspirates are unacceptable for Xpert Xpress SARS-CoV-2/FLU/RSV testing.  Fact Sheet for Patients: BloggerCourse.com  Fact Sheet for Healthcare Providers: SeriousBroker.it  This test is not yet approved or cleared by the [place]  FDA and has been authorized for detection and/or diagnosis of SARS-CoV-2 by FDA under an Emergency Use Authorization (EUA). This EUA will remain in effect (meaning this test can be used) for the duration of the COVID-19 declaration under Section 564(b)(1) of the Act, 21 U.S.C. section 360bbb-3(b)(1), unless the authorization is terminated or revoked.     Resp Syncytial Virus by PCR NEGATIVE NEGATIVE Final    Comment: (NOTE) Fact Sheet for Patients: BloggerCourse.com  Fact Sheet for Healthcare Providers: SeriousBroker.it  This test is not yet approved or cleared by the [place] FDA and has been authorized for detection and/or diagnosis of SARS-CoV-2 by FDA under an Emergency Use Authorization (EUA). This EUA will remain in effect (meaning this test can be used) for the duration of the COVID-19 declaration under Section 564(b)(1) of the Act, 21 U.S.C. section 360bbb-3(b)(1), unless the authorization is terminated or revoked.  Performed at [facility], [address] [place], [place] [address]    Culture, blood (routine x 2)     Status: None (Preliminary result)   Collection Time: 12/18/22  6:47 AM   Specimen: BLOOD  Result Value Ref Range Status   Specimen Description   Final    BLOOD RIGHT ANTECUBITAL Performed at [facility], [address] [place], [place] [address]    Special Requests   Final    BOTTLES DRAWN AEROBIC AND ANAEROBIC Blood Culture adequate volume Performed at [facility], [address] [place], [place] [address]    Culture   Final    NO GROWTH 4 DAYS Performed at [facility], [address] [place], [place] [address]    Report Status PENDING  Incomplete     Discharge Instructions:   Discharge Instructions     Call MD for:  difficulty breathing, headache or visual disturbances   Complete by: As directed    Diet - low sodium heart healthy   Complete by: As directed    Diet Carb Modified   Complete by: As directed    Discharge instructions   Complete by: As directed    Follow-up with your primary care provider in 1 week.  Check blood work at that time.  Follow-up with the nephrologist as has been scheduled by you.  You have been taken off metformin and lisinopril which might need to be restarted once your kidney function gets better.  Seek medical attention for worsening symptoms.   Increase activity slowly   Complete by: As directed       Allergies as of 12/22/2022       Reactions   Ibuprofen Swelling        Medication List     STOP taking these medications    lisinopril 40 MG tablet Commonly known as: ZESTRIL   metFORMIN 1000 MG tablet Commonly known as: GLUCOPHAGE   omeprazole 20 MG capsule Commonly known as: PRILOSEC       TAKE these medications    amLODipine 5 MG tablet Commonly known as: NORVASC Take 1 tablet (5 mg total) by mouth daily. What changed:  medication strength how much to take Notes to patient: For your blood pressure   aspirin 81 MG tablet Take 1 tablet (81 mg total)  by mouth daily.   atorvastatin 10 MG tablet Commonly known as: LIPITOR Take 1 tablet (10 mg total) by mouth daily. Notes to patient: For cholesterol    Blood Pressure Monitor Devi Use to check blood pressure  daily   carvedilol 12.5 MG tablet Commonly known as: COREG Take 1 tablet (12.5 mg total) by mouth 2 (two) times daily with a meal.   cyclobenzaprine 10 MG tablet Commonly known as: FLEXERIL Take 1 tablet (10 mg total) by mouth at bedtime.   furosemide 20 MG tablet Commonly known as: LASIX Take 2 tablets (40 mg total) by mouth daily. What changed:  how much to take when to take this   glipiZIDE 5 MG 24 hr tablet Commonly known as: GLUCOTROL XL Take 1 tablet by mouth once daily with breakfast Notes to patient: For your blood sugar   isosorbide-hydrALAZINE 20-37.5 MG tablet Commonly known as: BIDIL Take 0.5 tablets by mouth 3 (three) times daily.   levothyroxine 50 MCG tablet Commonly known as: SYNTHROID Take 1 tablet by mouth once daily   Ozempic (0.25 or 0.5 MG/DOSE) 2 MG/3ML Sopn Generic drug: Semaglutide(0.25 or 0.5MG /DOS) Apply 2.5 mg topically once a week.   pantoprazole 40 MG tablet Commonly known as: PROTONIX Take 1 tablet (40 mg total) by mouth daily.   triamcinolone 0.025 % ointment Commonly known as: KENALOG Apply 1 application. topically 2 (two) times daily.   True Metrix Blood Glucose Test test strip Generic drug: glucose blood Use to check blood sugar 3 times daily.   True Metrix Meter w/Device Kit Use to check blood sugar 3 times daily.   TRUEplus Lancets 28G Misc Use to check blood sugar 3 times daily.        Follow-up Information     Jonna Clark, MD Follow up.   Specialty: Internal Medicine Why: Please follow up in a week. Contact information: [address] [place] [address] [phone]         [facility] Follow up.   Specialty: Cardiology Why: 01/08/23 at  9:30 am  Hospital Follow-up in the Advanced Heart Failure Clinic at [facility], Entrance C Contact information: [address] [place] [address] [phone]        Marcine Matar, MD Follow up in 3 day(s).   Specialty: Internal Medicine Why: Hospital follow up appointment with PCP- Monday, December 25, 2023 at 3:50 pm  Please arrive 15 minutes early Contact information: [address] [place] [address] [phone]                  Time coordinating discharge: 39 minutes  Signed:     [facility] 12/22/2022, 11:45 AM

## 2022-12-20 NOTE — TOC Transition Note (Signed)
Transition of Care [facility]) - CM/SW Discharge Note   Patient Details  Name: Linda Phelps MRN: [id number] Date of Birth: [birth year]  [facility]) CM/SW Contact:  Leone Haven, RN Phone Number: 12/20/2022, 11:30 AM   Clinical Narrative:    Patient is for dc today, she has no needs.   Final next level of care: Home/Self Care Barriers to Discharge: Continued Medical Work up   Patient Goals and CMS Choice   Choice offered to / list presented to : NA  Discharge Placement                         Discharge Plan and Services Additional resources added to the After Visit Summary for   In-house Referral: NA Discharge Planning Services: CM Consult Post Acute Care Choice: NA          DME Arranged: N/A DME Agency: NA       HH Arranged: NA          Social Determinants of Health (SDOH) Interventions SDOH Screenings   Food Insecurity: No Food Insecurity (12/18/2022)  Housing: Low Risk  (12/18/2022)  Transportation Needs: No Transportation Needs (12/18/2022)  Utilities: Not At Risk (12/18/2022)  Depression (PHQ2-9): Low Risk  (09/26/2021)  Tobacco Use: Low Risk  (12/18/2022)     Readmission Risk Interventions     No data to display

## 2022-12-20 NOTE — Consult Note (Addendum)
Advanced Heart Failure Team Consult Note   Primary Physician: Marcine Matar, MD PCP-Cardiologist:  Gypsy Balsam, MD  Reason for Consultation: Acute on chronic systolic CHF  HPI:    Linda Phelps is seen today for evaluation of acute on chronic systolic CHF at the request of Dr. Tyson Babinski with [facility]. 52 y.o. 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   She's had history of noncompliance with medications and follow-up. Looking back through her chart, her blood pressure has been uncontrolled for several years.   Echo 01/21: EF 60-65%, grade I DD, RV okay  She presented to the ED on 12/18/22 with complaints of worsening dyspnea and cough. She was hypertensive with BP up to 194/119. Required up to 15L HFNC. Labs significant for BNP 537, Scr 4.5, HS troponin 97>75. CXR with multifocal b/l airspace opacities R > L. She was given IV lasix, empiric antibiotics. She was admitted for acute respiratory failure 2/2 acute on chronic CHF, hypertensive emergency and AKI on CKD IV. Nephrology was consulted. She has diuresed well with IV lasix.   Echo 12/19/22: EF 20-25%, moderately dilated LV, grade II DD, RV okay  Advanced Heart Failure asked to assist with management of acute on chronic CHF with newly reduced LV function.   She works as a Copy at [employer]. No family history of CHF.   Rare ETOH use. No ETOH or other drug use.  Home Medications Prior to Admission medications   Medication Sig Start Date End Date Taking? Authorizing Provider  amLODipine (NORVASC) 10 MG tablet Take 1 tablet by mouth once daily 03/10/22  Yes Claiborne Rigg, NP  aspirin 81 MG tablet Take 1 tablet (81 mg total) by mouth daily. 10/17/17  Yes Georgian Co M, PA-C  atorvastatin (LIPITOR) 10 MG tablet Take 1 tablet (10 mg total) by mouth daily. 09/26/21  Yes Claiborne Rigg, NP  Blood Glucose Monitoring Suppl (TRUE  METRIX METER) w/Device KIT Use to check blood sugar 3 times daily. 04/21/21  Yes Marcine Matar, MD  Blood Pressure Monitor DEVI Use to check blood pressure daily 11/16/21  Yes Marcine Matar, MD  carvedilol (COREG) 12.5 MG tablet TAKE 1 & 1/2 (ONE & ONE-HALF) TABLETS BY MOUTH TWICE DAILY WITH A MEAL Patient taking differently: Take 12.5 mg by mouth 2 (two) times daily with a meal. 09/26/21  Yes Claiborne Rigg, NP  cyclobenzaprine (FLEXERIL) 10 MG tablet Take 1 tablet (10 mg total) by mouth at bedtime. 09/26/21  Yes Claiborne Rigg, NP  glipiZIDE (GLUCOTROL XL) 5 MG 24 hr tablet Take 1 tablet by mouth once daily with breakfast 03/10/22  Yes Claiborne Rigg, NP  glucose blood (TRUE METRIX BLOOD GLUCOSE TEST) test strip Use to check blood sugar 3 times daily. 04/21/21  Yes Marcine Matar, MD  levothyroxine (SYNTHROID) 50 MCG tablet Take 1 tablet by mouth once daily 03/10/22  Yes Claiborne Rigg, NP  lisinopril (ZESTRIL) 40 MG tablet Take 1 tablet (40 mg total) by mouth daily. 02/14/22  Yes Marcine Matar, MD  metFORMIN (GLUCOPHAGE) 1000 MG tablet Take 0.5 tablets (500 mg total) by mouth 2 (two) times daily with a meal. DOSE CHANGE 09/28/21  Yes Claiborne Rigg, NP  omeprazole (PRILOSEC) 20 MG capsule Take 20 mg by mouth daily. 04/08/21  Yes [provider]  OZEMPIC, 0.25 OR 0.5 MG/DOSE, 2 MG/3ML SOPN Apply 2.5  mg topically once a week. 09/06/22  Yes [provider]  pantoprazole (PROTONIX) 40 MG tablet Take 1 tablet (40 mg total) by mouth daily. 09/26/21  Yes Claiborne Rigg, NP  triamcinolone (KENALOG) 0.025 % ointment Apply 1 application. topically 2 (two) times daily. 10/04/21  Yes Claiborne Rigg, NP  TRUEplus Lancets 28G MISC Use to check blood sugar 3 times daily. 04/21/21  Yes Marcine Matar, MD  furosemide (LASIX) 20 MG tablet Take 2 tablets (40 mg total) by mouth daily. 12/20/22   Joycelyn Das, MD    Past Medical History: Past Medical History:  Diagnosis  Date   Atypical chest pain 11/18/2017   Cardiomegaly 09/23/2014   Cardiomyopathy, peripartum, delivered 09/21/2015   CHF (congestive heart failure) (HCC)    Chronic right-sided low back pain without sciatica 07/11/2016   Chronic systolic heart failure (HCC) 09/21/2015   Diabetes mellitus type 2 in obese 07/03/2014   Diabetes mellitus without complication (HCC)    Ectopic pregnancy    Essential hypertension 07/03/2014   Generalized anxiety disorder 07/26/2015   GERD (gastroesophageal reflux disease) 07/26/2015   High cholesterol    History of noncompliance with medical treatment 09/21/2015   Overview:  Per [facility]ologist [facility]  Formatting of this note might be different from the original. Per cardiologist notes from [facility]   Hordeolum externum (stye) 01/11/2016   Hyperlipidemia 07/03/2014   Hypertension    Hypothyroidism 07/03/2014   Long-term use of aspirin therapy 09/21/2015   Low back pain    Menstrual cycle problem 01/11/2016   Obesity    Obesity (BMI 30.0-34.9) 10/01/2015   Palpitations 07/26/2015   Poor dentition 04/13/2015   Thyroid disease    Vitamin D insufficiency 01/13/2016    Past Surgical History: Past Surgical History:  Procedure Laterality Date   APPENDECTOMY     CHOLECYSTECTOMY      Family History: Family History  Problem Relation Age of Onset   Diabetes Mother    Diabetes Maternal Grandmother    Colon cancer Maternal Uncle 58    Social History: Social History   Socioeconomic History   Marital status: Single    Spouse name: Not on file   Number of children: Not on file   Years of education: Not on file   Highest education level: Not on file  Occupational History   Not on file  Tobacco Use   Smoking status: Never   Smokeless tobacco: Never  Vaping Use   Vaping status: Never Used  Substance and Sexual Activity   Alcohol use: Yes    Alcohol/week: 0.0 standard drinks of alcohol    Comment: occ   Drug use: No   Sexual activity:  Not Currently    Birth control/protection: None  Other Topics Concern   Not on file  Social History Narrative   She has a 61 you daughter   39 yo son in jail until 01/2017   Dating   Works as Advertising copywriter   Social Determinants of Corporate investment banker Strain: Not on file  Food Insecurity: No Food Insecurity (12/18/2022)   Hunger Vital Sign    Worried About Running Out of Food in the Last Year: Never true    Ran Out of Food in the Last Year: Never true  Transportation Needs: No Transportation Needs (12/18/2022)   PRAPARE - Administrator, Civil Service (Medical): No    Lack of Transportation (Non-Medical): No  Physical Activity: Not on  file  Stress: Not on file  Social Connections: Not on file    Allergies:  Allergies  Allergen Reactions   Ibuprofen Swelling    Objective:    Vital Signs:   Temp:  [97.7 F (36.5 C)-98.5 F (36.9 C)] 98.2 F (36.8 C) (07/31 1134) Pulse Rate:  [70-88] 70 (07/31 1134) Resp:  [16-19] 16 (07/31 1134) BP: (112-127)/(63-84) 115/68 (07/31 1134) SpO2:  [96 %-100 %] 100 % (07/31 1134) Weight:  [87 kg] 87 kg (07/31 0436) Last BM Date : 12/19/22  Weight change: Filed Weights   12/18/22 1643 12/19/22 0546 12/20/22 0436  Weight: 87.1 kg 87.2 kg 87 kg    Intake/Output:   Intake/Output Summary (Last 24 hours) at 12/20/2022 1213 Last data filed at 12/20/2022 0820 Gross per 24 hour  Intake 440 ml  Output 500 ml  Net -60 ml      Physical Exam    General:  Well appearing.  HEENT: normal Neck: supple. JVP difficult. Carotids 2+ bilat; no bruits.  Cor: PMI nondisplaced. Regular rate & rhythm. No rubs, gallops or murmurs. Lungs: clear Abdomen: obese, soft, nontender, nondistended.  Extremities: no cyanosis, clubbing, rash, edema Neuro: alert & orientedx3. Affect pleasant   Telemetry   Afib 90s  EKG    Sinus tachycardia 105 bpm  Labs   Basic Metabolic Panel: Recent Labs  Lab 12/18/22 0502 12/18/22 0653  12/19/22 0224 12/20/22 0052  NA 139 139 136 138  K 4.7 5.1 4.4 4.5  CL 110  --  106 105  CO2 20*  --  21* 23  GLUCOSE 186*  --  148* 142*  BUN 40*  --  44* 53*  CREATININE 4.54*  --  5.17* 5.39*  CALCIUM 8.7*  --  8.6* 8.5*  MG  --   --   --  2.1    Liver Function Tests: Recent Labs  Lab 12/18/22 0502  AST 13*  ALT 9  ALKPHOS 96  BILITOT 0.5  PROT 7.8  ALBUMIN 3.6   No results for input(s): "LIPASE", "AMYLASE" in the last 168 hours. No results for input(s): "AMMONIA" in the last 168 hours.  CBC: Recent Labs  Lab 12/18/22 0502 12/18/22 0653 12/19/22 0224 12/20/22 0052  WBC 7.1  --  6.7 7.0  NEUTROABS 2.4  --   --   --   HGB 12.3 11.6* 10.8* 11.0*  HCT 37.1 34.0* 33.7* 34.9*  MCV 90.3  --  90.8 91.4  PLT 343  --  313 295    Cardiac Enzymes: No results for input(s): "CKTOTAL", "CKMB", "CKMBINDEX", "TROPONINI" in the last 168 hours.  BNP: BNP (last 3 results) Recent Labs    12/18/22 0502  BNP 537.4*    ProBNP (last 3 results) No results for input(s): "PROBNP" in the last 8760 hours.   CBG: Recent Labs  Lab 12/19/22 1138 12/19/22 1605 12/19/22 2049 12/20/22 0554 12/20/22 1131  GLUCAP 153* 157* 143* 122* 142*    Coagulation Studies: No results for input(s): "LABPROT", "INR" in the last 72 hours.   Imaging   ECHOCARDIOGRAM COMPLETE  Result Date: 12/19/2022    ECHOCARDIOGRAM REPORT   Patient Name:   STEVE SCHEIBLE Date of Exam: 12/19/2022 Medical Rec #:  [id number]   Height:       61.0 in Accession #:    [id number]  Weight:       192.3 lb Date of Birth:  December 03, 1970   BSA:          1.858 m  Patient Age:    52 years    BP:           136/67 mmHg Patient Gender: F           HR:           89 bpm. Exam Location:  Inpatient Procedure: 2D Echo, Intracardiac Opacification Agent, Color Doppler and Cardiac            Doppler Indications:    CHF  History:        Patient has prior history of Echocardiogram examinations, most                 recent 06/17/2019. CHF,  Cardiomegaly and Cardiomyopathy,                 Signs/Symptoms:Chest Pain; Risk Factors:Hypertension and                 Diabetes.  Sonographer:    Darlys Gales Referring Phys: [phone] DEBBY CROSLEY IMPRESSIONS  1. Left ventricular ejection fraction, by estimation, is 20 to 25%. The left ventricle has severely decreased function. The left ventricle demonstrates global hypokinesis. The left ventricular internal cavity size was moderately dilated. Left ventricular diastolic parameters are consistent with Grade II diastolic dysfunction (pseudonormalization).  2. Right ventricular systolic function is normal. The right ventricular size is normal. Tricuspid regurgitation signal is inadequate for assessing PA pressure.  3. Left atrial size was mildly dilated.  4. The mitral valve is normal in structure. Trivial mitral valve regurgitation. No evidence of mitral stenosis.  5. The aortic valve is tricuspid. Aortic valve regurgitation is not visualized. No aortic stenosis is present.  6. The inferior vena cava is normal in size with greater than 50% respiratory variability, suggesting right atrial pressure of 3 mmHg. FINDINGS  Left Ventricle: Left ventricular ejection fraction, by estimation, is 20 to 25%. The left ventricle has severely decreased function. The left ventricle demonstrates global hypokinesis. The left ventricular internal cavity size was moderately dilated. There is no left ventricular hypertrophy. Left ventricular diastolic parameters are consistent with Grade II diastolic dysfunction (pseudonormalization). Right Ventricle: The right ventricular size is normal. No increase in right ventricular wall thickness. Right ventricular systolic function is normal. Tricuspid regurgitation signal is inadequate for assessing PA pressure. Left Atrium: Left atrial size was mildly dilated. Right Atrium: Right atrial size was normal in size. Pericardium: Trivial pericardial effusion is present. Mitral Valve: The mitral valve  is normal in structure. There is mild calcification of the mitral valve leaflet(s). Trivial mitral valve regurgitation. No evidence of mitral valve stenosis. Tricuspid Valve: The tricuspid valve is normal in structure. Tricuspid valve regurgitation is not demonstrated. Aortic Valve: The aortic valve is tricuspid. Aortic valve regurgitation is not visualized. No aortic stenosis is present. Aortic valve mean gradient measures 3.0 mmHg. Aortic valve peak gradient measures 6.0 mmHg. Aortic valve area, by VTI measures 1.86 cm. Pulmonic Valve: The pulmonic valve was normal in structure. Pulmonic valve regurgitation is trivial. Aorta: The aortic root is normal in size and structure. Venous: The inferior vena cava is normal in size with greater than 50% respiratory variability, suggesting right atrial pressure of 3 mmHg. IAS/Shunts: No atrial level shunt detected by color flow Doppler.  LEFT VENTRICLE PLAX 2D LVIDd:         6.50 cm LVIDs:         5.40 cm LV PW:         1.10 cm LV IVS:  0.70 cm LVOT diam:     1.80 cm LV SV:         39 LV SV Index:   21 LVOT Area:     2.54 cm  RIGHT VENTRICLE RV S prime:     15.90 cm/s TAPSE (M-mode): 1.9 cm LEFT ATRIUM             Index        RIGHT ATRIUM          Index LA Vol (A2C):   54.7 ml 29.45 ml/m  RA Area:     9.20 cm LA Vol (A4C):   64.0 ml 34.45 ml/m  RA Volume:   15.50 ml 8.34 ml/m LA Biplane Vol: 64.5 ml 34.72 ml/m  AORTIC VALVE AV Area (Vmax):    1.95 cm AV Area (Vmean):   1.90 cm AV Area (VTI):     1.86 cm AV Vmax:           122.00 cm/s AV Vmean:          86.400 cm/s AV VTI:            0.209 m AV Peak Grad:      6.0 mmHg AV Mean Grad:      3.0 mmHg LVOT Vmax:         93.40 cm/s LVOT Vmean:        64.400 cm/s LVOT VTI:          0.153 m LVOT/AV VTI ratio: 0.73  AORTA Ao Root diam: 2.30 cm MITRAL VALVE MV Area (PHT): 5.42 cm    SHUNTS MV Decel Time: 140 msec    Systemic VTI:  0.15 m MV E velocity: 85.20 cm/s  Systemic Diam: 1.80 cm MV A velocity: 73.10 cm/s MV  E/A ratio:  1.17 Britian Jentz McleanMD Electronically signed by Wilfred Lacy Signature Date/Time: 12/19/2022/5:46:55 PM    Final      Medications:     Current Medications:  amLODipine  10 mg Oral Daily   aspirin EC  81 mg Oral Daily   atorvastatin  10 mg Oral Daily   carvedilol  12.5 mg Oral BID WC   heparin  5,000 Units Subcutaneous Q8H   insulin aspart  0-5 Units Subcutaneous QHS   insulin aspart  0-9 Units Subcutaneous TID WC   levothyroxine  50 mcg Oral Daily    Infusions:     Patient Profile   52 y.o. female with history of peripartum cardiomyopathy with recovered EF, DM II, HTN, obesity, CKD IV, hx noncompliance. Admitted with acute respiratory failure 2/2 acute on chronic CHF, hypertensive emergency and AKI on CKD IV.  Assessment/Plan   Acute on chronic systolic CHF: -Echo 11/2017: EF 60-65% -Echo 01/21: EF 60-65% -Echo 12/19/22: EF 20-25%, RV okay -Apparently has history of peripartum cardiomyopathy in remote past. Details uncertain and no echo available. ?Decline in EF d/t hypertension but no significant LVH on echo. On chart review, it seems her blood pressure has not been controlled for at least a few years. Does have risk factors for CAD. No LHC with CKD, contrast may tip her to HD. -Volume difficult on exam but does not appear significantly overloaded. Start po lasix 40 mg daily tomorrow -Does not appear low output clinically -Continue Coreg 12.5 mg BID -Decrease amlodipine to 5 mg daily to allow room for GDMT -Start bidil tomorrow -No ARNi/ARB/MRA with CKD. Home lisinopril stopped -Will hold off on SGLT2i with GFR < 20 -Eventually consider cardiac MRI once renal function  stabilizes  2. Hypertensive emergency: -BP improved nicely with resuming some of home meds. Reports mostly adherent with meds but occasionally misses doses due to her work schedule -Regimen as above -Consider outpatient sleep study  3. Possible AKI on CKD IV: -Suspect CKD d/t HTN + DMII, poss  AKI d/t hypertensive urgency + cardiorenal -Scr 2.7 in May 2023. Uncertain of recent baseline. Had recently been referred to Nephrology by her PCP -Scr this admit 4.5>5.4 -Nephrology following -Renal US consistent with medical renal disease, no hydronephrosis  4. Obesity: -BMI 36 -Had been on Ozempic but stopped d/t cost. Consider adding back as outpatient  5. DM II: -On glipizide and metformin at home. Metformin stopped d/t kidney disease -A1c pending   Length of Stay: 2  FINCH, LINDSAY N, PA-C  12/20/2022, 12:13 PM  Advanced Heart Failure Team Pager [phone] (M-F; 7a - 5p)  Please contact [facility] for night-coverage after hours (4p -7a ) and weekends on [email or web address]   Patient seen with PA, agree with the above note.   Patient has history of HTN, DM2, and CKD stage 3.  Reportedly had CHF around the time of daughter's birth 18 years ago but did not see a cardiologist at that time.  Says she developed HTN during that pregnancy, so may have been pregnancy-induced hypertension.  She had echoes in 2019 and 2021 with normal EF.   She has been on medications for DM and HTN.  She takes her medicine most of the time but does miss doses.  She was admitted on 12/18/22 with worsening dyspnea, BP markedly elevated at admission and hypoxemic with pulmonary edema.  AKI on CKD stage 3 with creatinine 4.54 from prior 2.73 1 year ago. Echo was done, showing EF 20-25% with moderate LV dilation but normal wall thickness, normal RV, IVC not dilated (after diuresis). She has been diuresed with IV Lasix and breathing is much better, creatinine up to 5.39. No chest pain.   General: NAD Neck: No JVD, no thyromegaly or thyroid nodule.  Lungs: Clear to auscultation bilaterally with normal respiratory effort. CV: Nondisplaced PMI.  Heart regular S1/S2, no S3/S4, no murmur.  No peripheral edema.  No carotid bruit.  Normal pedal pulses.  Abdomen: Soft, nontender, no hepatosplenomegaly, no distention.  Skin:  Intact without lesions or rashes.  Neurologic: Alert and oriented x 3.  Psych: Normal affect. Extremities: No clubbing or cyanosis.  HEENT: Normal.   1. Acute systolic CHF:  Echo in 7/24 with EF 20-25% with moderate LV dilation but normal wall thickness, normal RV, IVC not dilated. In setting of long-standing HTN, DM2, and CKD stage 3.  Cause is uncertain, possibly due to long-standing HTN but walls are not thick. Poorly controlled DM could play a role.  She was said to have HF when her daughter was born but do not have echo available from then and echoes in 2019 and 2021 showed normal EF.  No ETOH or drugs.  She did have a brother who had a cardiac arrest, but he had multiple medical problems.  No chest pain and mild HS-TnI elevation with no trend suggests no ACS (demand ischemia with volume overload on admission).  She is not volume overloaded on exam, BP no longer elevated.  - Would start Lasix 40 mg daily tomorrow as long as creatinine stabilizes.  She was on Lasix 20 mg daily at home.  - Continue Coreg 12.5 mg bid.  - Decrease amlodipine to 5 mg daily and start Bidil 1 tab tid.  -  GDMT will be limited by CKD stage IV. GFR too low to start SGLT2 inhibitor.  - Ideally, would have cardiac MRI to assess for infiltrative disease/prior MI. Think we can do this when creatinine has stabilized out.  Risk of nephrogenic sclerosing fibrosis is small, but would like to see renal function stable prior to giving contrast.  2. HTN: BP is now controlled.  Hard to know how much HTN contributed to cardiomyopathy as she has bene a long-time hypertensive and prior echoes have been normal.  Also, LV walls are not particularly thick.  3. Typ 2 diabetes: Per primary.  4. AKI on CKD stage 3 or 4: Prior creatinine was 2.73 in 5/23.  Not sure of current baseline. Creatinine up to 5.39 this admission but diuresed well (initial creatinine 4.54). She now looks euvolemic.  - If creatinine stable, start Lasix 40 mg po daily  tomorrow.   Marca Ancona 12/20/2022 3:09 PM

## 2022-12-20 NOTE — Plan of Care (Signed)
  Problem: Metabolic: Goal: Ability to maintain appropriate glucose levels will improve Outcome: Progressing   

## 2022-12-20 NOTE — Progress Notes (Signed)
[facility] Progress Note   Assessment/ Plan:   Assessment/Plan: Renal failure: difficult to determine how much of an acute component there is as she certainly has had significant CKD with a cr that was 2.73 09/26/21 and already seen by a nephrologist a year ago during a hospitalization. Last labs were ~56mths ago at her PMD Dr. Suzzanne Cloud at the [facility] location [phone]. 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 She has had a very good response to diuresis with with relative lower dose Lasix. Metformin and Lisinopril already on hold appropriately. - Renal ultrasound negative - Urine sediment is inactive. - would start Lasix 40 mg PO daily on d/c- was on 20 mg PO at home - sees nephrology in [facility]- appt tomorrow 12/21/22, discussed that she should keep - OK from renal perspective to d/c today - eGFR too low for metformin- would not restart, would also hold lisinopril until determined appropriate to add back as OP   -Monitor Daily I/Os, Daily weight  - Avoid nephrotoxic agents such as IV contrast, NSAIDs, and phosphate containing bowel preps (FLEETS)   Hypertension: restarted on home regimen and BP already improved from initial readings from the ED. Would not drop more than 30% 1st 24hrs to maintain vital organ perfusion. DM: per primary Hypothyroidism Obesity    Subjective:    Seen in room.  Feeling fine.  Cr relatively plateaued.  Eager for d/c   Objective:   BP 115/67 (BP Location: Left Arm)   Pulse 78   Temp 97.8 F (36.6 C) (Oral)   Resp 17   Ht 5\' 1"  (1.549 m)   Wt 87 kg   LMP 10/08/2013   SpO2 99%   BMI 36.24 kg/m   Intake/Output Summary (Last 24 hours) at 12/20/2022 1105 Last data filed at 12/20/2022 0820 Gross per 24 hour  Intake 440 ml  Output 500 ml  Net -60 ml   Weight change: -0.1 kg  Physical Exam: [id number] flat in bed, NAD CVS:  RRR Resp: clear Abd: soft Ext: no LE edema  Imaging: ECHOCARDIOGRAM COMPLETE  Result Date: 12/19/2022    ECHOCARDIOGRAM REPORT   Patient Name:   Linda Phelps Date of Exam: 12/19/2022 Medical Rec #:  [id number]   Height:       61.0 in Accession #:    [id number]  Weight:       192.3 lb Date of Birth:  19-Feb-1971   BSA:          1.858 m Patient Age:    52 years    BP:           136/67 mmHg Patient Gender: F           HR:           89 bpm. Exam Location:  Inpatient Procedure: 2D Echo, Intracardiac Opacification Agent, Color Doppler and Cardiac            Doppler Indications:    CHF  History:        Patient has prior history of Echocardiogram examinations, most                 recent 06/17/2019. CHF, Cardiomegaly and Cardiomyopathy,                 Signs/Symptoms:Chest Pain; Risk Factors:Hypertension and  Diabetes.  Sonographer:    Darlys Gales Referring Phys: [phone] DEBBY CROSLEY IMPRESSIONS  1. Left ventricular ejection fraction, by estimation, is 20 to 25%. The left ventricle has severely decreased function. The left ventricle demonstrates global hypokinesis. The left ventricular internal cavity size was moderately dilated. Left ventricular diastolic parameters are consistent with Grade II diastolic dysfunction (pseudonormalization).  2. Right ventricular systolic function is normal. The right ventricular size is normal. Tricuspid regurgitation signal is inadequate for assessing PA pressure.  3. Left atrial size was mildly dilated.  4. The mitral valve is normal in structure. Trivial mitral valve regurgitation. No evidence of mitral stenosis.  5. The aortic valve is tricuspid. Aortic valve regurgitation is not visualized. No aortic stenosis is present.  6. The inferior vena cava is normal in size with greater than 50% respiratory variability, suggesting right atrial pressure of 3 mmHg. FINDINGS  Left Ventricle: Left ventricular ejection fraction, by estimation, is 20 to 25%. The left ventricle has  severely decreased function. The left ventricle demonstrates global hypokinesis. The left ventricular internal cavity size was moderately dilated. There is no left ventricular hypertrophy. Left ventricular diastolic parameters are consistent with Grade II diastolic dysfunction (pseudonormalization). Right Ventricle: The right ventricular size is normal. No increase in right ventricular wall thickness. Right ventricular systolic function is normal. Tricuspid regurgitation signal is inadequate for assessing PA pressure. Left Atrium: Left atrial size was mildly dilated. Right Atrium: Right atrial size was normal in size. Pericardium: Trivial pericardial effusion is present. Mitral Valve: The mitral valve is normal in structure. There is mild calcification of the mitral valve leaflet(s). Trivial mitral valve regurgitation. No evidence of mitral valve stenosis. Tricuspid Valve: The tricuspid valve is normal in structure. Tricuspid valve regurgitation is not demonstrated. Aortic Valve: The aortic valve is tricuspid. Aortic valve regurgitation is not visualized. No aortic stenosis is present. Aortic valve mean gradient measures 3.0 mmHg. Aortic valve peak gradient measures 6.0 mmHg. Aortic valve area, by VTI measures 1.86 cm. Pulmonic Valve: The pulmonic valve was normal in structure. Pulmonic valve regurgitation is trivial. Aorta: The aortic root is normal in size and structure. Venous: The inferior vena cava is normal in size with greater than 50% respiratory variability, suggesting right atrial pressure of 3 mmHg. IAS/Shunts: No atrial level shunt detected by color flow Doppler.  LEFT VENTRICLE PLAX 2D LVIDd:         6.50 cm LVIDs:         5.40 cm LV PW:         1.10 cm LV IVS:        0.70 cm LVOT diam:     1.80 cm LV SV:         39 LV SV Index:   21 LVOT Area:     2.54 cm  RIGHT VENTRICLE RV S prime:     15.90 cm/s TAPSE (M-mode): 1.9 cm LEFT ATRIUM             Index        RIGHT ATRIUM          Index LA Vol (A2C):    54.7 ml 29.45 ml/m  RA Area:     9.20 cm LA Vol (A4C):   64.0 ml 34.45 ml/m  RA Volume:   15.50 ml 8.34 ml/m LA Biplane Vol: 64.5 ml 34.72 ml/m  AORTIC VALVE AV Area (Vmax):    1.95 cm AV Area (Vmean):   1.90 cm AV Area (VTI):     1.86  cm AV Vmax:           122.00 cm/s AV Vmean:          86.400 cm/s AV VTI:            0.209 m AV Peak Grad:      6.0 mmHg AV Mean Grad:      3.0 mmHg LVOT Vmax:         93.40 cm/s LVOT Vmean:        64.400 cm/s LVOT VTI:          0.153 m LVOT/AV VTI ratio: 0.73  AORTA Ao Root diam: 2.30 cm MITRAL VALVE MV Area (PHT): 5.42 cm    SHUNTS MV Decel Time: 140 msec    Systemic VTI:  0.15 m MV E velocity: 85.20 cm/s  Systemic Diam: 1.80 cm MV A velocity: 73.10 cm/s MV E/A ratio:  1.17 Dalton McleanMD Electronically signed by Wilfred Lacy Signature Date/Time: 12/19/2022/5:46:55 PM    Final    US RENAL  Result Date: 12/19/2022 CLINICAL DATA:  Acute renal failure. EXAM: RENAL / URINARY TRACT ULTRASOUND COMPLETE COMPARISON:  None Available. FINDINGS: Right Kidney: Renal measurements: 6.2 x 2.8 x 3.9 cm = volume: 35.7 mL. Increased parenchymal echogenicity. A cyst is present in the midpole measuring 2.0 x 2.1 x 1.9 cm. No mass or hydronephrosis visualized. Left Kidney: Renal measurements: 8.1 x 4.8 x 3.6 cm = volume: 74.1 mL. Increased parenchymal echogenicity. No mass or hydronephrosis visualized. Bladder: Appears normal for degree of bladder distention. Bilateral ureteral jets are present. Other: Examination is limited due to patient's body habitus. IMPRESSION: 1. Atrophic kidneys bilaterally with increased renal parenchymal echogenicity, compatible with medical renal disease. 2. Right renal cyst. Electronically Signed   By: Thornell Sartorius M.D.   On: 12/19/2022 03:37    Labs: BMET Recent Labs  Lab 12/18/22 0502 12/18/22 0653 12/19/22 0224 12/20/22 0052  NA 139 139 136 138  K 4.7 5.1 4.4 4.5  CL 110  --  106 105  CO2 20*  --  21* 23  GLUCOSE 186*  --  148* 142*  BUN 40*   --  44* 53*  CREATININE 4.54*  --  5.17* 5.39*  CALCIUM 8.7*  --  8.6* 8.5*   CBC Recent Labs  Lab 12/18/22 0502 12/18/22 0653 12/19/22 0224 12/20/22 0052  WBC 7.1  --  6.7 7.0  NEUTROABS 2.4  --   --   --   HGB 12.3 11.6* 10.8* 11.0*  HCT 37.1 34.0* 33.7* 34.9*  MCV 90.3  --  90.8 91.4  PLT 343  --  313 295    Medications:     amLODipine  10 mg Oral Daily   aspirin EC  81 mg Oral Daily   atorvastatin  10 mg Oral Daily   carvedilol  12.5 mg Oral BID WC   heparin  5,000 Units Subcutaneous Q8H   insulin aspart  0-5 Units Subcutaneous QHS   insulin aspart  0-9 Units Subcutaneous TID WC   levothyroxine  50 mcg Oral Daily    Bufford Buttner MD 12/20/2022, 11:05 AM

## 2022-12-20 NOTE — Progress Notes (Signed)
PROGRESS NOTE    Linda Phelps  GNF:[id number] DOB: 23-Feb-1971 DOA: 12/18/2022 PCP: Marcine Matar, MD    Brief Narrative:   Linda Phelps is a 52 y.o. female with medical history significant of hypertension, hyperlipidemia, diastolic CHF, diabetes mellitus type 2, obesity presented to the hospital with complaints of shortness of breath,"wet" cough. She notes losing weight a couple months ago while on Ozempic, but due to loss of insurance had gone back to taking metformin and glipizide.   Patient makes note that she was scheduled to follow-up with nephrology in August.  She also reports adequate compliance with taking her medications. In the ED, BP was elevated at 194/119, O2 saturations as low as 79% with improvement on high flow nasal cannula oxygen initially at 15 L.  Labs significant for BUN 40, creatinine 1.54, glucose 186, BNP 537, high-sensitivity troponin 92 >75.  Chest x-ray noted multifocal bilateral airspace opacities right greater than left.  Patient received Lasix 100 mg IV, doxycycline, Rocephin, nitroglycerin, Zofran in the ED, weaned to 5 L of oxygen and was admitted to the hospital for further evaluation and treatment.  Assessment and Plan:   Acute respiratory failure with hypoxia secondary to suspected pulmonary edema secondary to systolic CHF exacerbation Resolved at this time.  Initial pulse ox of 79% on room air.  Initially placed on high flow nasal cannula at 15 L and was subsequently weaned to room air.  Chest x-ray noted multifocal opacities right greater than left.  BNP elevated at 537.  2D echocardiogram showed LV ejection fraction of 20 to 25% with global hypokinesis.  Communicated with advanced heart failure team due to new findings of the low EF.  Nephrology has recommended Lasix 40 on discharge.  Patient does have an appointment with nephrology to follow-up on 12/21/2022   Hypertensive emergency/urgency  Initial BP of 194/119 with elevated troponins with worsened kidney  function. Continue Coreg and amlodipine on discharge.  Hold off with lisinopril due to worsening renal failure.  Was initially on nitro drip which has been discontinued.   Latest blood pressure of 115/67.  Patient with reduced LV ejection fraction.  Will get heart failure team to see the patient.   Elevated troponin High-sensitivity troponin 92->75.  No chest pain. Echo with low EF.  Consulted cardiology..  Unable to use ACE inhibitor or ARB or Entresto at this time.  Patient appears to be fully compensated.  Follow cardiology recommendations.    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  Baseline creatinine previously noted to be 2.7 back in 09/2021. Creatinine today at 5.3. Renal ultrasound with atrophic kidneys bilaterally with increased parenchymal echogenicity.  Nephrology has seen the patient and recommend follow-up with outpatient nephrology.  Okay from nephrology point of view for discharge but due to low EF patient will be in the hospital.    Diabetes mellitus type 2, without long-term use of insulin Patient is on metformin and glipizide as outpatient.  Continue to hold oral hypoglycemic agents.  Continue sliding scale insulin,Diabetic diet, Accu-Cheks.   Hypothyroidism Continue Synthroid.  TSH of 2.5.   Hyperlipidemia Continue Lipitor   Obesity 36.28 kg/m.  Patient would benefit from weight loss as outpatient.     DVT prophylaxis: heparin injection 5,000 Units Start: 12/18/22 1745   Code Status:     Code Status: Full Code  Disposition: Likely home in 1 to 2 days when okay with cardiology  Status is: Inpatient  Remains inpatient  appropriate because: Significantly low EF, renal failure,    Family Communication: Spoke with the patient's sister at bedside.  Consultants:  Nephrology Advanced heart failure team  Procedures:  None  Antimicrobials:  None  Anti-infectives (From admission, onward)    Start      Dose/Rate Route Frequency Ordered Stop   12/18/22 0630  doxycycline (VIBRA-TABS) tablet 100 mg        100 mg Oral  Once 12/18/22 0615 12/18/22 0644   12/18/22 0615  cefTRIAXone (ROCEPHIN) 1 g in sodium chloride 0.9 % 100 mL IVPB        1 g 200 mL/hr over 30 Minutes Intravenous  Once 12/18/22 0615 12/18/22 0713       Subjective: Today, patient was seen and examined at bedside.  Denies any shortness of breath, dyspnea, chest pain dizziness lightheadedness.  2D echocardiogram with reported to EF significantly low.  Objective: Vitals:   12/20/22 0436 12/20/22 0820 12/20/22 0834 12/20/22 1134  BP: 122/68 127/81 115/67 115/68  Pulse: 88 75 78 70  Resp: 19 18 17 16   Temp: 98.2 F (36.8 C) 97.8 F (36.6 C) 97.8 F (36.6 C) 98.2 F (36.8 C)  TempSrc: Oral Oral Oral Oral  SpO2: 100% 100% 99% 100%  Weight: 87 kg     Height:        Intake/Output Summary (Last 24 hours) at 12/20/2022 1253 Last data filed at 12/20/2022 0820 Gross per 24 hour  Intake 440 ml  Output 500 ml  Net -60 ml   Filed Weights   12/18/22 1643 12/19/22 0546 12/20/22 0436  Weight: 87.1 kg 87.2 kg 87 kg    Physical Examination: Body mass index is 36.24 kg/m.   General: Obese built, not in obvious distress, on room air HENT:   No scleral pallor or icterus noted. Oral mucosa is moist.  Chest:  Clear breath sounds.   No crackles or wheezes.  CVS: S1 &S2 heard. No murmur.  Regular rate and rhythm. Abdomen: Soft, nontender, nondistended.  Bowel sounds are heard.   Extremities: No cyanosis, clubbing with trace edema  Psych: Alert, awake and oriented, normal mood CNS:  No cranial nerve deficits.  Power equal in all extremities.   Skin: Warm and dry.  No rashes noted.  Data Reviewed:   CBC: Recent Labs  Lab 12/18/22 0502 12/18/22 0653 12/19/22 0224 12/20/22 0052  WBC 7.1  --  6.7 7.0  NEUTROABS 2.4  --   --   --   HGB 12.3 11.6* 10.8* 11.0*  HCT 37.1 34.0* 33.7* 34.9*  MCV 90.3  --  90.8 91.4  PLT 343   --  313 295    Basic Metabolic Panel: Recent Labs  Lab 12/18/22 0502 12/18/22 0653 12/19/22 0224 12/20/22 0052  NA 139 139 136 138  K 4.7 5.1 4.4 4.5  CL 110  --  106 105  CO2 20*  --  21* 23  GLUCOSE 186*  --  148* 142*  BUN 40*  --  44* 53*  CREATININE 4.54*  --  5.17* 5.39*  CALCIUM 8.7*  --  8.6* 8.5*  MG  --   --   --  2.1    Liver Function Tests: Recent Labs  Lab 12/18/22 0502  AST 13*  ALT 9  ALKPHOS 96  BILITOT 0.5  PROT 7.8  ALBUMIN 3.6     Radiology Studies: ECHOCARDIOGRAM COMPLETE  Result Date: 12/19/2022    ECHOCARDIOGRAM REPORT   Patient Name:   [facility]  Date of Exam: 12/19/2022 Medical Rec #:  [id number]   Height:       61.0 in Accession #:    [id number]  Weight:       192.3 lb Date of Birth:  11/13/1970   BSA:          1.858 m Patient Age:    52 years    BP:           136/67 mmHg Patient Gender: F           HR:           89 bpm. Exam Location:  Inpatient Procedure: 2D Echo, Intracardiac Opacification Agent, Color Doppler and Cardiac            Doppler Indications:    CHF  History:        Patient has prior history of Echocardiogram examinations, most                 recent 06/17/2019. CHF, Cardiomegaly and Cardiomyopathy,                 Signs/Symptoms:Chest Pain; Risk Factors:Hypertension and                 Diabetes.  Sonographer:    Darlys Gales Referring Phys: [phone] DEBBY CROSLEY IMPRESSIONS  1. Left ventricular ejection fraction, by estimation, is 20 to 25%. The left ventricle has severely decreased function. The left ventricle demonstrates global hypokinesis. The left ventricular internal cavity size was moderately dilated. Left ventricular diastolic parameters are consistent with Grade II diastolic dysfunction (pseudonormalization).  2. Right ventricular systolic function is normal. The right ventricular size is normal. Tricuspid regurgitation signal is inadequate for assessing PA pressure.  3. Left atrial size was mildly dilated.  4. The mitral valve is  normal in structure. Trivial mitral valve regurgitation. No evidence of mitral stenosis.  5. The aortic valve is tricuspid. Aortic valve regurgitation is not visualized. No aortic stenosis is present.  6. The inferior vena cava is normal in size with greater than 50% respiratory variability, suggesting right atrial pressure of 3 mmHg. FINDINGS  Left Ventricle: Left ventricular ejection fraction, by estimation, is 20 to 25%. The left ventricle has severely decreased function. The left ventricle demonstrates global hypokinesis. The left ventricular internal cavity size was moderately dilated. There is no left ventricular hypertrophy. Left ventricular diastolic parameters are consistent with Grade II diastolic dysfunction (pseudonormalization). Right Ventricle: The right ventricular size is normal. No increase in right ventricular wall thickness. Right ventricular systolic function is normal. Tricuspid regurgitation signal is inadequate for assessing PA pressure. Left Atrium: Left atrial size was mildly dilated. Right Atrium: Right atrial size was normal in size. Pericardium: Trivial pericardial effusion is present. Mitral Valve: The mitral valve is normal in structure. There is mild calcification of the mitral valve leaflet(s). Trivial mitral valve regurgitation. No evidence of mitral valve stenosis. Tricuspid Valve: The tricuspid valve is normal in structure. Tricuspid valve regurgitation is not demonstrated. Aortic Valve: The aortic valve is tricuspid. Aortic valve regurgitation is not visualized. No aortic stenosis is present. Aortic valve mean gradient measures 3.0 mmHg. Aortic valve peak gradient measures 6.0 mmHg. Aortic valve area, by VTI measures 1.86 cm. Pulmonic Valve: The pulmonic valve was normal in structure. Pulmonic valve regurgitation is trivial. Aorta: The aortic root is normal in size and structure. Venous: The inferior vena cava is normal in size with greater than 50% respiratory variability,  suggesting right atrial pressure of 3 mmHg. IAS/Shunts:  No atrial level shunt detected by color flow Doppler.  LEFT VENTRICLE PLAX 2D LVIDd:         6.50 cm LVIDs:         5.40 cm LV PW:         1.10 cm LV IVS:        0.70 cm LVOT diam:     1.80 cm LV SV:         39 LV SV Index:   21 LVOT Area:     2.54 cm  RIGHT VENTRICLE RV S prime:     15.90 cm/s TAPSE (M-mode): 1.9 cm LEFT ATRIUM             Index        RIGHT ATRIUM          Index LA Vol (A2C):   54.7 ml 29.45 ml/m  RA Area:     9.20 cm LA Vol (A4C):   64.0 ml 34.45 ml/m  RA Volume:   15.50 ml 8.34 ml/m LA Biplane Vol: 64.5 ml 34.72 ml/m  AORTIC VALVE AV Area (Vmax):    1.95 cm AV Area (Vmean):   1.90 cm AV Area (VTI):     1.86 cm AV Vmax:           122.00 cm/s AV Vmean:          86.400 cm/s AV VTI:            0.209 m AV Peak Grad:      6.0 mmHg AV Mean Grad:      3.0 mmHg LVOT Vmax:         93.40 cm/s LVOT Vmean:        64.400 cm/s LVOT VTI:          0.153 m LVOT/AV VTI ratio: 0.73  AORTA Ao Root diam: 2.30 cm MITRAL VALVE MV Area (PHT): 5.42 cm    SHUNTS MV Decel Time: 140 msec    Systemic VTI:  0.15 m MV E velocity: 85.20 cm/s  Systemic Diam: 1.80 cm MV A velocity: 73.10 cm/s MV E/A ratio:  1.17 Dalton McleanMD Electronically signed by Wilfred Lacy Signature Date/Time: 12/19/2022/5:46:55 PM    Final    US RENAL  Result Date: 12/19/2022 CLINICAL DATA:  Acute renal failure. EXAM: RENAL / URINARY TRACT ULTRASOUND COMPLETE COMPARISON:  None Available. FINDINGS: Right Kidney: Renal measurements: 6.2 x 2.8 x 3.9 cm = volume: 35.7 mL. Increased parenchymal echogenicity. A cyst is present in the midpole measuring 2.0 x 2.1 x 1.9 cm. No mass or hydronephrosis visualized. Left Kidney: Renal measurements: 8.1 x 4.8 x 3.6 cm = volume: 74.1 mL. Increased parenchymal echogenicity. No mass or hydronephrosis visualized. Bladder: Appears normal for degree of bladder distention. Bilateral ureteral jets are present. Other: Examination is limited due to  patient's body habitus. IMPRESSION: 1. Atrophic kidneys bilaterally with increased renal parenchymal echogenicity, compatible with medical renal disease. 2. Right renal cyst. Electronically Signed   By: Thornell Sartorius M.D.   On: 12/19/2022 03:37      LOS: 2 days    Joycelyn Das, MD [facility] Available via Epic secure chat 7am-7pm After these hours, please refer to coverage provider listed on [email or web address] 12/20/2022, 12:53 PM

## 2022-12-20 NOTE — Progress Notes (Signed)
Heart Failure Navigator Progress Note  Assessed for Heart & Vascular TOC clinic readiness.  Patient does not meet criteria due to Advanced Heart Failure Team consulted. .   Navigator will sign off at this time.   Dawn Fields, BSN, RN Heart Failure Nurse Navigator Secure Chat Only   

## 2022-12-21 DIAGNOSIS — I5021 Acute systolic (congestive) heart failure: Secondary | ICD-10-CM

## 2022-12-21 DIAGNOSIS — J9601 Acute respiratory failure with hypoxia: Secondary | ICD-10-CM | POA: Diagnosis not present

## 2022-12-21 LAB — GLUCOSE, CAPILLARY
Glucose-Capillary: 178 mg/dL — ABNORMAL HIGH (ref 70–99)
Glucose-Capillary: 147 mg/dL — ABNORMAL HIGH (ref 70–99)
Glucose-Capillary: 118 mg/dL — ABNORMAL HIGH (ref 70–99)
Glucose-Capillary: 168 mg/dL — ABNORMAL HIGH (ref 70–99)

## 2022-12-21 MED ORDER — FUROSEMIDE 40 MG PO TABS
40.0000 mg | ORAL_TABLET | Freq: Every day | ORAL | Status: DC
  Administered 2022-12-21 – 2022-12-22 (×2): 40 mg via ORAL
  Filled 2022-12-21 (×2): qty 1

## 2022-12-21 MED ORDER — ISOSORB DINITRATE-HYDRALAZINE 20-37.5 MG PO TABS
0.5000 | ORAL_TABLET | Freq: Three times a day (TID) | ORAL | Status: DC
  Administered 2022-12-21 – 2022-12-22 (×4): 0.5 via ORAL
  Filled 2022-12-21 (×4): qty 1

## 2022-12-21 NOTE — Progress Notes (Signed)
Brief note: Cr stable Lasix 40 mg PO added Agree with plan- otherwise nothing further to add for now- will sign off, call with questions  Bufford Buttner MD [facility]

## 2022-12-21 NOTE — Progress Notes (Signed)
Advanced Heart Failure Rounding Note  PCP-Cardiologist: Gypsy Balsam, MD   Subjective:    BP overall much improved since admit, 135/83 today.   SCr stable past 24 hr, 5.39>>5.37.  K 4.7  Feels better. Laying flat in bed. No dyspnea or orthopnea.     Objective:   Weight Range: 86.9 kg Body mass index is 36.18 kg/m.   Vital Signs:   Temp:  [97.8 F (36.6 C)-98.6 F (37 C)] 97.9 F (36.6 C) (08/01 0810) Pulse Rate:  [70-81] 76 (08/01 0810) Resp:  [16-18] 18 (08/01 0810) BP: (115-144)/(67-86) 135/83 (08/01 0810) SpO2:  [98 %-100 %] 99 % (08/01 0810) Weight:  [86.9 kg] 86.9 kg (08/01 0433) Last BM Date : 12/19/22  Weight change: Filed Weights   12/19/22 0546 12/20/22 0436 12/21/22 0433  Weight: 87.2 kg 87 kg 86.9 kg    Intake/Output:   Intake/Output Summary (Last 24 hours) at 12/21/2022 0919 Last data filed at 12/20/2022 1700 Gross per 24 hour  Intake 240 ml  Output --  Net 240 ml      Physical Exam    General:  Well appearing, mod obese. No resp difficulty HEENT: Normal Neck: Supple. JVP not elevated . Carotids 2+ bilat; no bruits. No lymphadenopathy or thyromegaly appreciated. Cor: PMI nondisplaced. Regular rate & rhythm. No rubs, gallops or murmurs. Lungs: Clear Abdomen: Soft, nontender, nondistended. No hepatosplenomegaly. No bruits or masses. Good bowel sounds. Extremities: No cyanosis, clubbing, rash, edema Neuro: Alert & orientedx3, cranial nerves grossly intact. moves all 4 extremities w/o difficulty. Affect pleasant   Telemetry   NSR 80s   EKG    N/A   Labs    CBC Recent Labs    12/20/22 0052 12/21/22 0250  WBC 7.0 6.7  HGB 11.0* 11.0*  HCT 34.9* 34.4*  MCV 91.4 91.0  PLT 295 286   Basic Metabolic Panel Recent Labs    29/51/88 0052 12/21/22 0250  NA 138 135  K 4.5 4.7  CL 105 104  CO2 23 21*  GLUCOSE 142* 122*  BUN 53* 51*  CREATININE 5.39* 5.37*  CALCIUM 8.5* 8.5*  MG 2.1 2.2   Liver Function Tests No  results for input(s): "AST", "ALT", "ALKPHOS", "BILITOT", "PROT", "ALBUMIN" in the last 72 hours. No results for input(s): "LIPASE", "AMYLASE" in the last 72 hours. Cardiac Enzymes No results for input(s): "CKTOTAL", "CKMB", "CKMBINDEX", "TROPONINI" in the last 72 hours.  BNP: BNP (last 3 results) Recent Labs    12/18/22 0502  BNP 537.4*    ProBNP (last 3 results) No results for input(s): "PROBNP" in the last 8760 hours.   D-Dimer No results for input(s): "DDIMER" in the last 72 hours. Hemoglobin A1C No results for input(s): "HGBA1C" in the last 72 hours. Fasting Lipid Panel No results for input(s): "CHOL", "HDL", "LDLCALC", "TRIG", "CHOLHDL", "LDLDIRECT" in the last 72 hours. Thyroid Function Tests Recent Labs    12/18/22 1722  TSH 2.565    Other results:   Imaging    No results found.   Medications:     Scheduled Medications:  amLODipine  5 mg Oral Daily   aspirin EC  81 mg Oral Daily   atorvastatin  10 mg Oral Daily   carvedilol  12.5 mg Oral BID WC   heparin  5,000 Units Subcutaneous Q8H   insulin aspart  0-5 Units Subcutaneous QHS   insulin aspart  0-9 Units Subcutaneous TID WC   levothyroxine  50 mcg Oral Daily    Infusions:  PRN Medications: hydrALAZINE, ipratropium-albuterol    Patient Profile   52 y.o. female with history of peripartum cardiomyopathy with recovered EF, DM II, HTN, obesity, CKD IV, hx noncompliance. Admitted with acute respiratory failure 2/2 acute on chronic CHF, hypertensive emergency and AKI on CKD IV.   Assessment/Plan   1. Acute systolic CHF:  Echo in 7/24 with EF 20-25% with moderate LV dilation but normal wall thickness, normal RV, IVC not dilated. In setting of long-standing HTN, DM2, and CKD stage 3.  Cause is uncertain, possibly due to long-standing HTN but walls are not thick. Poorly controlled DM could play a role.  She was said to have HF when her daughter was born but do not have echo available from then and  echoes in 2019 and 2021 showed normal EF.  No ETOH or drugs.  She did have a brother who had a cardiac arrest, but he had multiple medical problems.  No chest pain and mild HS-TnI elevation with no trend suggests no ACS (demand ischemia with volume overload on admission).  She is not volume overloaded on exam, BP no longer elevated.  - SCr stable past 24 hrs, 5.39>>>5.37. Start PO Lasix 40 mg daily.  She was on Lasix 20 mg daily at home.  - Continue Coreg 12.5 mg bid.   - start Bidil 1/2 tablet tid today  - Continue amlodipine 5 mg daily. Can wean further if needed to allow more room for GDMT  - GDMT will be limited by CKD stage IV. GFR too low to start SGLT2 inhibitor.  - Ideally, would have cardiac MRI to assess for infiltrative disease/prior MI. Think we can do this when creatinine has stabilized out.  Risk of nephrogenic sclerosing fibrosis is small, but would like to see renal function stable prior to giving contrast.   2. HTN: BP is now controlled.  Hard to know how much HTN contributed to cardiomyopathy as she has bene a long-time hypertensive and prior echoes have been normal.  Also, LV walls are not particularly thick.  - GDMT per above   3. Type 2 diabetes: Per primary.   4. AKI on CKD stage 3 or 4: Prior creatinine was 2.73 in 5/23.  Not sure of current baseline. Creatinine up to 5.39 this admission but diuresed well (initial creatinine 4.54). She now looks euvolemic. SCr stable today at 5.37. K 4.7   - will start Lasix 40 mg po daily today. Nephrology has seen and also in agreement  - will follow w/ outpatient nephrology    Length of Stay: [id number] Linda Phelps  12/21/2022, 9:19 AM  Advanced Heart Failure Team Pager [phone] (M-F; 7a - 5p)  Please contact [facility] for night-coverage after hours (5p -7a ) and weekends on [email or web address]

## 2022-12-21 NOTE — Progress Notes (Signed)
PROGRESS NOTE    Linda Phelps  EXB:[id number] DOB: [birth year] DOA: 12/18/2022 PCP: Marcine Matar, MD    Brief Narrative:   Linda Phelps is a 52 y.o. female with medical history significant of hypertension, hyperlipidemia, diastolic CHF, diabetes mellitus type 2, obesity presented to the hospital with complaints of shortness of breath,"wet" cough. She notes losing weight a couple months ago while on Ozempic, but due to loss of insurance had gone back to taking metformin and glipizide.   Patient makes note that she was scheduled to follow-up with nephrology in August.  She also reports adequate compliance with taking her medications. In the ED, BP was elevated at 194/119, O2 saturations as low as 79% with improvement on high flow nasal cannula oxygen initially at 15 L.  Labs significant for BUN 40, creatinine 1.54, glucose 186, BNP 537, high-sensitivity troponin 92 >75.  Chest x-ray noted multifocal bilateral airspace opacities right greater than left.  Patient received Lasix 100 mg IV, doxycycline, Rocephin, nitroglycerin, Zofran in the ED, weaned to 5 L of oxygen and was admitted to the hospital for further evaluation and treatment.  Assessment and Plan:   Acute respiratory failure with hypoxia secondary to suspected pulmonary edema secondary to systolic CHF exacerbation Resolved at this time.  Initial pulse ox of 79% on room air.  Initially placed on high flow nasal cannula at 15 L and was subsequently weaned to room air.  Chest x-ray noted multifocal opacities right greater than left.  BNP elevated at 537.  2D echocardiogram showed LV ejection fraction of 20 to 25% with global hypokinesis.  Communicated with advanced heart failure team due to new findings of the low EF.  Nephrology has recommended Lasix 40 milligram on discharge.  Patient follows up with [facility] nephrology as outpatient.  Hypertensive emergency/urgency  Initial BP of 194/119 with elevated troponins with worsened  kidney function. Continue Coreg and amlodipine on discharge.  Hold off with lisinopril due to worsening renal failure.  Was initially on nitro drip which has been discontinued.   Latest blood pressure of 115/67.  Patient with reduced LV ejection fraction.  Advanced heart failure team on board and patient has been started on Coreg 12.5 mg twice daily and BiDil half tablet 3 times daily with amlodipine 5 mg.  GDMT limited by CKD stage IV.  Patient would benefit from MRI as outpatient.  Has been started on Lasix 40 daily.  Adjusting medication.   Elevated troponin High-sensitivity troponin 92->75.  No chest pain. Echo with low EF.  Cardiology on board and adjusting medications.     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  Baseline creatinine previously noted to be 2.7 back in 09/2021. Creatinine today at 5.3. Renal ultrasound with atrophic kidneys bilaterally with increased parenchymal echogenicity.  Nephrology on board.  Plan is to start Lasix p.o.  Creatinine stable.  Diabetes mellitus type 2, without long-term use of insulin Patient is on metformin and glipizide as outpatient.  Continue to hold oral hypoglycemic agents.  Continue sliding scale insulin,Diabetic diet, Accu-Cheks.   Hypothyroidism Continue Synthroid.  TSH of 2.5.   Hyperlipidemia Continue Lipitor   Obesity 36.28 kg/m.  Patient would benefit from weight loss as outpatient.     DVT prophylaxis: heparin injection 5,000 Units Start: 12/18/22 1745   Code Status:     Code Status: Full Code  Disposition: Likely home in 1 to 2 days when okay with cardiology  Status  is: Inpatient  Remains inpatient appropriate because: Significantly low EF, renal failure,    Family Communication:   None at bedside.  Spoke with the patient's sister at bedside on 12/20/2022.  Consultants:  Nephrology Advanced heart failure team  Procedures:  None  Antimicrobials:   None  Anti-infectives (From admission, onward)    Start     Dose/Rate Route Frequency Ordered Stop   12/18/22 0630  doxycycline (VIBRA-TABS) tablet 100 mg        100 mg Oral  Once 12/18/22 0615 12/18/22 0644   12/18/22 0615  cefTRIAXone (ROCEPHIN) 1 g in sodium chloride 0.9 % 100 mL IVPB        1 g 200 mL/hr over 30 Minutes Intravenous  Once 12/18/22 0615 12/18/22 0713       Subjective: Today, patient was seen and examined at bedside.  Denies any shortness of breath, dyspnea, chest pain, dizziness lightheadedness.  Appears stable.  Objective: Vitals:   12/20/22 1958 12/21/22 0016 12/21/22 0433 12/21/22 0810  BP: 122/75 (!) 144/86 117/67 135/83  Pulse: 76 77 81 76  Resp:    18  Temp: 98.6 F (37 C) 98.4 F (36.9 C) 97.9 F (36.6 C) 97.9 F (36.6 C)  TempSrc: Oral Oral Oral Oral  SpO2: 100% 100% 98% 99%  Weight:   86.9 kg   Height:        Intake/Output Summary (Last 24 hours) at 12/21/2022 1031 Last data filed at 12/20/2022 1700 Gross per 24 hour  Intake 240 ml  Output --  Net 240 ml   Filed Weights   12/19/22 0546 12/20/22 0436 12/21/22 0433  Weight: 87.2 kg 87 kg 86.9 kg    Physical Examination: Body mass index is 36.18 kg/m.   General: Obese built, not in obvious distress HENT:   No scleral pallor or icterus noted. Oral mucosa is moist.  Chest:  Clear breath sounds.  Diminished breath sounds bilaterally. No crackles or wheezes.  CVS: S1 &S2 heard. No murmur.  Regular rate and rhythm. Abdomen: Soft, nontender, nondistended.  Bowel sounds are heard.   Extremities: No cyanosis, clubbing or edema.  Peripheral pulses are palpable. Psych: Alert, awake and oriented, normal mood CNS:  No cranial nerve deficits.  Power equal in all extremities.   Skin: Warm and dry.  No rashes noted.   Data Reviewed:   CBC: Recent Labs  Lab 12/18/22 0502 12/18/22 0653 12/19/22 0224 12/20/22 0052 12/21/22 0250  WBC 7.1  --  6.7 7.0 6.7  NEUTROABS 2.4  --   --   --   --    HGB 12.3 11.6* 10.8* 11.0* 11.0*  HCT 37.1 34.0* 33.7* 34.9* 34.4*  MCV 90.3  --  90.8 91.4 91.0  PLT 343  --  313 295 286    Basic Metabolic Panel: Recent Labs  Lab 12/18/22 0502 12/18/22 0653 12/19/22 0224 12/20/22 0052 12/21/22 0250  NA 139 139 136 138 135  K 4.7 5.1 4.4 4.5 4.7  CL 110  --  106 105 104  CO2 20*  --  21* 23 21*  GLUCOSE 186*  --  148* 142* 122*  BUN 40*  --  44* 53* 51*  CREATININE 4.54*  --  5.17* 5.39* 5.37*  CALCIUM 8.7*  --  8.6* 8.5* 8.5*  MG  --   --   --  2.1 2.2    Liver Function Tests: Recent Labs  Lab 12/18/22 0502  AST 13*  ALT 9  ALKPHOS 96  BILITOT  0.5  PROT 7.8  ALBUMIN 3.6     Radiology Studies: ECHOCARDIOGRAM COMPLETE  Result Date: 12/19/2022    ECHOCARDIOGRAM REPORT   Patient Name:   DARNASIA GARDON Date of Exam: 12/19/2022 Medical Rec #:  [id number]   Height:       61.0 in Accession #:    [id number]  Weight:       192.3 lb Date of Birth:  22-Jan-1971   BSA:          1.858 m Patient Age:    52 years    BP:           136/67 mmHg Patient Gender: F           HR:           89 bpm. Exam Location:  Inpatient Procedure: 2D Echo, Intracardiac Opacification Agent, Color Doppler and Cardiac            Doppler Indications:    CHF  History:        Patient has prior history of Echocardiogram examinations, most                 recent 06/17/2019. CHF, Cardiomegaly and Cardiomyopathy,                 Signs/Symptoms:Chest Pain; Risk Factors:Hypertension and                 Diabetes.  Sonographer:    Darlys Gales Referring Phys: [phone] DEBBY CROSLEY IMPRESSIONS  1. Left ventricular ejection fraction, by estimation, is 20 to 25%. The left ventricle has severely decreased function. The left ventricle demonstrates global hypokinesis. The left ventricular internal cavity size was moderately dilated. Left ventricular diastolic parameters are consistent with Grade II diastolic dysfunction (pseudonormalization).  2. Right ventricular systolic function is normal. The right  ventricular size is normal. Tricuspid regurgitation signal is inadequate for assessing PA pressure.  3. Left atrial size was mildly dilated.  4. The mitral valve is normal in structure. Trivial mitral valve regurgitation. No evidence of mitral stenosis.  5. The aortic valve is tricuspid. Aortic valve regurgitation is not visualized. No aortic stenosis is present.  6. The inferior vena cava is normal in size with greater than 50% respiratory variability, suggesting right atrial pressure of 3 mmHg. FINDINGS  Left Ventricle: Left ventricular ejection fraction, by estimation, is 20 to 25%. The left ventricle has severely decreased function. The left ventricle demonstrates global hypokinesis. The left ventricular internal cavity size was moderately dilated. There is no left ventricular hypertrophy. Left ventricular diastolic parameters are consistent with Grade II diastolic dysfunction (pseudonormalization). Right Ventricle: The right ventricular size is normal. No increase in right ventricular wall thickness. Right ventricular systolic function is normal. Tricuspid regurgitation signal is inadequate for assessing PA pressure. Left Atrium: Left atrial size was mildly dilated. Right Atrium: Right atrial size was normal in size. Pericardium: Trivial pericardial effusion is present. Mitral Valve: The mitral valve is normal in structure. There is mild calcification of the mitral valve leaflet(s). Trivial mitral valve regurgitation. No evidence of mitral valve stenosis. Tricuspid Valve: The tricuspid valve is normal in structure. Tricuspid valve regurgitation is not demonstrated. Aortic Valve: The aortic valve is tricuspid. Aortic valve regurgitation is not visualized. No aortic stenosis is present. Aortic valve mean gradient measures 3.0 mmHg. Aortic valve peak gradient measures 6.0 mmHg. Aortic valve area, by VTI measures 1.86 cm. Pulmonic Valve: The pulmonic valve was normal in structure. Pulmonic valve regurgitation is  trivial. Aorta:  The aortic root is normal in size and structure. Venous: The inferior vena cava is normal in size with greater than 50% respiratory variability, suggesting right atrial pressure of 3 mmHg. IAS/Shunts: No atrial level shunt detected by color flow Doppler.  LEFT VENTRICLE PLAX 2D LVIDd:         6.50 cm LVIDs:         5.40 cm LV PW:         1.10 cm LV IVS:        0.70 cm LVOT diam:     1.80 cm LV SV:         39 LV SV Index:   21 LVOT Area:     2.54 cm  RIGHT VENTRICLE RV S prime:     15.90 cm/s TAPSE (M-mode): 1.9 cm LEFT ATRIUM             Index        RIGHT ATRIUM          Index LA Vol (A2C):   54.7 ml 29.45 ml/m  RA Area:     9.20 cm LA Vol (A4C):   64.0 ml 34.45 ml/m  RA Volume:   15.50 ml 8.34 ml/m LA Biplane Vol: 64.5 ml 34.72 ml/m  AORTIC VALVE AV Area (Vmax):    1.95 cm AV Area (Vmean):   1.90 cm AV Area (VTI):     1.86 cm AV Vmax:           122.00 cm/s AV Vmean:          86.400 cm/s AV VTI:            0.209 m AV Peak Grad:      6.0 mmHg AV Mean Grad:      3.0 mmHg LVOT Vmax:         93.40 cm/s LVOT Vmean:        64.400 cm/s LVOT VTI:          0.153 m LVOT/AV VTI ratio: 0.73  AORTA Ao Root diam: 2.30 cm MITRAL VALVE MV Area (PHT): 5.42 cm    SHUNTS MV Decel Time: 140 msec    Systemic VTI:  0.15 m MV E velocity: 85.20 cm/s  Systemic Diam: 1.80 cm MV A velocity: 73.10 cm/s MV E/A ratio:  1.17 Dalton McleanMD Electronically signed by Wilfred Lacy Signature Date/Time: 12/19/2022/5:46:55 PM    Final       LOS: 3 days    Joycelyn Das, MD [facility] Available via Epic secure chat 7am-7pm After these hours, please refer to coverage provider listed on [email or web address] 12/21/2022, 10:31 AM

## 2022-12-21 NOTE — Plan of Care (Signed)
  Problem: Education: Goal: Ability to describe self-care measures that may prevent or decrease complications (Diabetes Survival Skills Education) will improve Outcome: Progressing   Problem: Coping: Goal: Ability to adjust to condition or change in health will improve Outcome: Progressing   Problem: Nutritional: Goal: Maintenance of adequate nutrition will improve Outcome: Progressing   

## 2022-12-21 NOTE — TOC Progression Note (Addendum)
Transition of Care [facility]) - Progression Note    Patient Details  Name: Linda Phelps MRN: [id number] Date of Birth: 1970-09-06  [facility]) CM/SW Contact  Nicanor Bake Phone Number: [phone] 12/21/2022, 12:49 PM  Clinical Narrative:  CSW met with pt at the bedside. CSW built rapport with the pt. Pt stated that she is feeling much better today. Pt stated that she has support at home. CSW informed the pt that a PCP follow up appointment will be scheduled. Pt stated that she does not have any equipment at home and get a scale. Pt stated that she will need a work note once she is discharged. Pt inquired about her medication and CSW informed her that she will reach out and have someone follow up to answer her medication questions. TOC will continue following.     Expected Discharge Plan: Home/Self Care Barriers to Discharge: Continued Medical Work up  Expected Discharge Plan and Services In-house Referral: NA Discharge Planning Services: CM Consult Post Acute Care Choice: NA Living arrangements for the past 2 months: Single Family Home Expected Discharge Date: 12/20/22               DME Arranged: N/A DME Agency: NA       HH Arranged: NA           Social Determinants of Health (SDOH) Interventions SDOH Screenings   Food Insecurity: No Food Insecurity (12/18/2022)  Housing: Low Risk  (12/18/2022)  Transportation Needs: No Transportation Needs (12/18/2022)  Utilities: Not At Risk (12/18/2022)  Depression (PHQ2-9): Low Risk  (09/26/2021)  Tobacco Use: Low Risk  (12/18/2022)    Readmission Risk Interventions     No data to display

## 2022-12-22 ENCOUNTER — Other Ambulatory Visit: Payer: Self-pay | Admitting: Internal Medicine

## 2022-12-22 LAB — GLUCOSE, CAPILLARY
Glucose-Capillary: 134 mg/dL — ABNORMAL HIGH (ref 70–99)
Glucose-Capillary: 165 mg/dL — ABNORMAL HIGH (ref 70–99)

## 2022-12-22 MED ORDER — ISOSORB DINITRATE-HYDRALAZINE 20-37.5 MG PO TABS: 0.5000 | ORAL_TABLET | Freq: Three times a day (TID) | ORAL | 2 refills | Status: DC

## 2022-12-22 MED ORDER — AMLODIPINE BESYLATE 5 MG PO TABS: 5.0000 mg | ORAL_TABLET | Freq: Every day | ORAL | 2 refills | Status: DC

## 2022-12-22 MED ORDER — CARVEDILOL 12.5 MG PO TABS
12.5000 mg | ORAL_TABLET | Freq: Two times a day (BID) | ORAL | Status: DC
Start: 2022-12-22 — End: 2023-01-01

## 2022-12-22 NOTE — Progress Notes (Unsigned)
{  Select_TRH_Note:[id number]} 

## 2022-12-22 NOTE — Plan of Care (Signed)
  Problem: Education: Goal: Ability to describe self-care measures that may prevent or decrease complications (Diabetes Survival Skills Education) will improve Outcome: Adequate for Discharge Goal: Individualized Educational Video(s) Outcome: Adequate for Discharge   Problem: Coping: Goal: Ability to adjust to condition or change in health will improve Outcome: Adequate for Discharge   Problem: Fluid Volume: Goal: Ability to maintain a balanced intake and output will improve Outcome: Adequate for Discharge   Problem: Health Behavior/Discharge Planning: Goal: Ability to identify and utilize available resources and services will improve Outcome: Adequate for Discharge Goal: Ability to manage health-related needs will improve Outcome: Adequate for Discharge   Problem: Metabolic: Goal: Ability to maintain appropriate glucose levels will improve Outcome: Adequate for Discharge   Problem: Nutritional: Goal: Maintenance of adequate nutrition will improve Outcome: Adequate for Discharge Goal: Progress toward achieving an optimal weight will improve Outcome: Adequate for Discharge   Problem: Skin Integrity: Goal: Risk for impaired skin integrity will decrease Outcome: Adequate for Discharge   Problem: Tissue Perfusion: Goal: Adequacy of tissue perfusion will improve Outcome: Adequate for Discharge   Problem: Education: Goal: Knowledge of General Education information will improve Description: Including pain rating scale, medication(s)/side effects and non-pharmacologic comfort measures Outcome: Adequate for Discharge   Problem: Health Behavior/Discharge Planning: Goal: Ability to manage health-related needs will improve Outcome: Adequate for Discharge   Problem: Clinical Measurements: Goal: Ability to maintain clinical measurements within normal limits will improve Outcome: Adequate for Discharge Goal: Will remain free from infection Outcome: Adequate for Discharge Goal:  Diagnostic test results will improve Outcome: Adequate for Discharge Goal: Respiratory complications will improve Outcome: Adequate for Discharge Goal: Cardiovascular complication will be avoided Outcome: Adequate for Discharge   Problem: Activity: Goal: Risk for activity intolerance will decrease Outcome: Adequate for Discharge   Problem: Nutrition: Goal: Adequate nutrition will be maintained Outcome: Adequate for Discharge   Problem: Coping: Goal: Level of anxiety will decrease Outcome: Adequate for Discharge   Problem: Elimination: Goal: Will not experience complications related to bowel motility Outcome: Adequate for Discharge Goal: Will not experience complications related to urinary retention Outcome: Adequate for Discharge   Problem: Pain Managment: Goal: General experience of comfort will improve Outcome: Adequate for Discharge   Problem: Safety: Goal: Ability to remain free from injury will improve Outcome: Adequate for Discharge   Problem: Skin Integrity: Goal: Risk for impaired skin integrity will decrease Outcome: Adequate for Discharge   Problem: Education: Goal: Ability to demonstrate management of disease process will improve Outcome: Adequate for Discharge Goal: Ability to verbalize understanding of medication therapies will improve Outcome: Adequate for Discharge Goal: Individualized Educational Video(s) Outcome: Adequate for Discharge   Problem: Activity: Goal: Capacity to carry out activities will improve Outcome: Adequate for Discharge   Problem: Cardiac: Goal: Ability to achieve and maintain adequate cardiopulmonary perfusion will improve Outcome: Adequate for Discharge   Problem: Education: Goal: Ability to demonstrate management of disease process will improve Outcome: Adequate for Discharge Goal: Ability to verbalize understanding of medication therapies will improve Outcome: Adequate for Discharge Goal: Individualized Educational  Video(s) Outcome: Adequate for Discharge   Problem: Activity: Goal: Capacity to carry out activities will improve Outcome: Adequate for Discharge   Problem: Cardiac: Goal: Ability to achieve and maintain adequate cardiopulmonary perfusion will improve Outcome: Adequate for Discharge

## 2022-12-22 NOTE — TOC Progression Note (Signed)
Transition of Care [facility]) - Progression Note    Patient Details  Name: Linda Phelps MRN: [id number] Date of Birth: [birth year]  Transition of Care [facility]) CM/SW Contact  Nicanor Bake Phone Number: [phone] 12/22/2022, 11:36 AM  Clinical Narrative:  CSW called and scheduled pt's follow up hospital appointment with her PCP for Monday, August 5th at 3:50 PM. [facility] will continue following.      Expected Discharge Plan: Home/Self Care Barriers to Discharge: Continued Medical Work up  Expected Discharge Plan and Services In-house Referral: NA Discharge Planning Services: CM Consult Post Acute Care Choice: NA Living arrangements for the past 2 months: Single Family Home Expected Discharge Date: 12/22/22               DME Arranged: N/A DME Agency: NA       HH Arranged: NA           Social Determinants of Health (SDOH) Interventions SDOH Screenings   Food Insecurity: No Food Insecurity (12/18/2022)  Housing: Low Risk  (12/18/2022)  Transportation Needs: No Transportation Needs (12/18/2022)  Utilities: Not At Risk (12/18/2022)  Depression (PHQ2-9): Low Risk  (09/26/2021)  Tobacco Use: Low Risk  (12/18/2022)    Readmission Risk Interventions     No data to display

## 2022-12-22 NOTE — Progress Notes (Signed)
Approximately 1300-- Pt discharged to home. AVS discharge instructions provided by [facility], RN. No questions or concerns from pt at this time. Pt was provided with requested note for work as well. At time of discharge, pt A&O x 4 and VSS. All PIVs removed. All pt belongings taken with pt--pt verified. Pt transported home by daughter in personal vehicle. Telemetry discontinued and removed.

## 2022-12-22 NOTE — TOC Transition Note (Addendum)
Transition of Care [facility]) - CM/SW Discharge Note   Patient Details  Name: Linda Phelps MRN: [id number] Date of Birth: [birth year]  Transition of Care [facility]) CM/SW Contact:  Nicanor Bake Phone Number: [phone] 12/22/2022, 1:21 PM   Clinical Narrative:  CSW scheduled the pt's PCP follow up hospital appointment. Pt has a note for work. Pt's family arranged transportation for dc.     Final next level of care: Home/Self Care Barriers to Discharge: No Barriers Identified   Patient Goals and CMS Choice   Choice offered to / list presented to : NA  Discharge Placement                         Discharge Plan and Services Additional resources added to the After Visit Summary for   In-house Referral: NA Discharge Planning Services: CM Consult Post Acute Care Choice: NA          DME Arranged: N/A DME Agency: NA       HH Arranged: NA          Social Determinants of Health (SDOH) Interventions SDOH Screenings   Food Insecurity: No Food Insecurity (12/18/2022)  Housing: Low Risk  (12/18/2022)  Transportation Needs: No Transportation Needs (12/18/2022)  Utilities: Not At Risk (12/18/2022)  Depression (PHQ2-9): Low Risk  (09/26/2021)  Tobacco Use: Low Risk  (12/18/2022)     Readmission Risk Interventions     No data to display

## 2022-12-25 ENCOUNTER — Inpatient Hospital Stay: Payer: No Typology Code available for payment source | Admitting: Internal Medicine

## 2022-12-26 ENCOUNTER — Telehealth: Payer: Self-pay

## 2022-12-26 NOTE — Transitions of Care (Post Inpatient/ED Visit) (Signed)
   12/26/2022  Name: Linda Phelps MRN: [id number] DOB: 12/06/1970  Today's TOC FU Call Status: Today's TOC FU Call Status:: Successful TOC FU Call Completed TOC FU Call Complete Date: 12/26/22  Transition Care Management Follow-up Telephone Call Date of Discharge: 12/22/22 Discharge Facility: [facility]) Type of Discharge: Inpatient Admission Primary Inpatient Discharge Diagnosis:: acute pulmonary edema How have you been since you were released from the hospital?: Better Any questions or concerns?: Yes Patient Questions/Concerns:: She is requesting an order for a home BP monitor Patient Questions/Concerns Addressed: Notified Provider of Patient Questions/Concerns  Items Reviewed: Did you receive and understand the discharge instructions provided?: Yes Medications obtained,verified, and reconciled?: Partial Review Completed Reason for Partial Mediation Review: She said she has all medications as well as a glucometer.  She did not have any questions about the med regime and said she did not need to review the med list.  She explained that she would like instructions about self -administering the ozempic.  her daughter has been giving it to her and she will soon be leaving for college. Any new allergies since your discharge?: No Dietary orders reviewed?: Yes Type of Diet Ordered:: heart healthy diabetic Do you have support at home?: Yes  Medications Reviewed Today: Medications Reviewed Today   Medications were not reviewed in this encounter     Home Care and Equipment/Supplies: Were Home Health Services Ordered?: No Any new equipment or medical supplies ordered?: No  Functional Questionnaire: Do you need assistance with bathing/showering or dressing?: No Do you need assistance with meal preparation?: No Do you need assistance with eating?: No Do you have difficulty maintaining continence: No Do you need assistance with getting out of bed/getting out of a chair/moving?: No Do  you have difficulty managing or taking your medications?: No  Follow up appointments reviewed: PCP Follow-up appointment confirmed?: Yes Date of PCP follow-up appointment?: 01/01/23 Follow-up Provider: Dr Alvis Lemmings.  She said she was not aware that she had an appointment at [facility] yesterday Specialist [facility] Follow-up appointment confirmed?: Yes Date of Specialist follow-up appointment?: 01/08/23 Follow-Up Specialty Provider:: [facility] Do you need transportation to your follow-up appointment?: No Do you understand care options if your condition(s) worsen?: Yes-patient verbalized understanding    SIGNATURE Robyne Peers, RN

## 2022-12-28 ENCOUNTER — Telehealth: Payer: Self-pay

## 2022-12-28 DIAGNOSIS — I10 Essential (primary) hypertension: Secondary | ICD-10-CM

## 2022-12-28 NOTE — Telephone Encounter (Signed)
The patient is requesting an rx for a home BP monitor.

## 2023-01-01 ENCOUNTER — Encounter: Payer: Self-pay | Admitting: Family Medicine

## 2023-01-01 ENCOUNTER — Ambulatory Visit: Payer: Medicaid Other | Attending: Family Medicine | Admitting: Family Medicine

## 2023-01-01 ENCOUNTER — Telehealth: Payer: Self-pay | Admitting: Internal Medicine

## 2023-01-01 VITALS — BP 117/73 | HR 79 | Ht 61.0 in | Wt 198.0 lb

## 2023-01-01 DIAGNOSIS — Z7984 Long term (current) use of oral hypoglycemic drugs: Secondary | ICD-10-CM | POA: Diagnosis not present

## 2023-01-01 DIAGNOSIS — E1122 Type 2 diabetes mellitus with diabetic chronic kidney disease: Secondary | ICD-10-CM

## 2023-01-01 DIAGNOSIS — E039 Hypothyroidism, unspecified: Secondary | ICD-10-CM

## 2023-01-01 DIAGNOSIS — E1165 Type 2 diabetes mellitus with hyperglycemia: Secondary | ICD-10-CM | POA: Diagnosis not present

## 2023-01-01 DIAGNOSIS — I5042 Chronic combined systolic (congestive) and diastolic (congestive) heart failure: Secondary | ICD-10-CM

## 2023-01-01 DIAGNOSIS — E7800 Pure hypercholesterolemia, unspecified: Secondary | ICD-10-CM

## 2023-01-01 DIAGNOSIS — Z7985 Long-term (current) use of injectable non-insulin antidiabetic drugs: Secondary | ICD-10-CM | POA: Diagnosis not present

## 2023-01-01 DIAGNOSIS — I120 Hypertensive chronic kidney disease with stage 5 chronic kidney disease or end stage renal disease: Secondary | ICD-10-CM

## 2023-01-01 DIAGNOSIS — I110 Hypertensive heart disease with heart failure: Secondary | ICD-10-CM

## 2023-01-01 DIAGNOSIS — N185 Chronic kidney disease, stage 5: Secondary | ICD-10-CM

## 2023-01-01 LAB — POCT GLYCOSYLATED HEMOGLOBIN (HGB A1C): HbA1c, POC (controlled diabetic range): 7.4 % — AB (ref 0.0–7.0)

## 2023-01-01 MED ORDER — CARVEDILOL 12.5 MG PO TABS
12.5000 mg | ORAL_TABLET | Freq: Two times a day (BID) | ORAL | 1 refills | Status: DC
Start: 2023-01-01 — End: 2023-04-13

## 2023-01-01 MED ORDER — GLIPIZIDE ER 5 MG PO TB24
5.0000 mg | ORAL_TABLET | Freq: Every day | ORAL | 1 refills | Status: DC
Start: 2023-01-01 — End: 2023-05-09

## 2023-01-01 MED ORDER — OZEMPIC (0.25 OR 0.5 MG/DOSE) 2 MG/3ML ~~LOC~~ SOPN: 0.5000 mg | PEN_INJECTOR | SUBCUTANEOUS | 0 refills | Status: DC

## 2023-01-01 MED ORDER — OZEMPIC (0.25 OR 0.5 MG/DOSE) 2 MG/1.5ML ~~LOC~~ SOPN: 0.2500 mg | PEN_INJECTOR | SUBCUTANEOUS | 0 refills | Status: DC

## 2023-01-01 MED ORDER — SEMAGLUTIDE (1 MG/DOSE) 4 MG/3ML ~~LOC~~ SOPN: 1.0000 mg | PEN_INJECTOR | SUBCUTANEOUS | 3 refills | Status: DC

## 2023-01-01 MED ORDER — LEVOTHYROXINE SODIUM 50 MCG PO TABS
50.0000 ug | ORAL_TABLET | Freq: Every day | ORAL | 1 refills | Status: DC
Start: 2023-01-01 — End: 2023-05-09

## 2023-01-01 MED ORDER — ATORVASTATIN CALCIUM 10 MG PO TABS
10.0000 mg | ORAL_TABLET | Freq: Every day | ORAL | 1 refills | Status: DC
Start: 2023-01-01 — End: 2023-04-13

## 2023-01-01 NOTE — Patient Instructions (Signed)
Heart Failure, Diagnosis  Heart failure means that your heart is not able to pump blood in the right way. This makes it hard for your body to work well. Heart failure is usually a long-term (chronic) condition. You must take good care of yourself and follow your treatment plan from your doctor. Different stages of heart failure have different treatment plans. The stages are: Stage A: At risk for heart failure. Stage B: Pre-heart failure. Stage C: Symptomatic heart failure. Stage D: Advanced heart failure. What are the causes? High blood pressure. Buildup of cholesterol and fat in the arteries. Heart attack. This injures the heart muscle. Heart valves that do not open and close properly. Damage of the heart muscle. This is also called cardiomyopathy. Infection of the heart muscle. This is also called myocarditis. Lung disease. What increases the risk? Getting older. The risk of heart failure goes up as a person ages. Being overweight. Using tobacco or nicotine products. Abusing alcohol or drugs. Having taken medicines that can damage the heart. Having any of these conditions: Diabetes. Abnormal heart rhythms. Thyroid problems. Low blood counts (anemia). Having a family history of heart failure. What are the signs or symptoms? Shortness of breath. Coughing. Swelling of the feet, ankles, legs, or belly. Losing or gaining weight for no reason. Trouble breathing. Waking from sleep because of the need to sit up and get more air. Fast heartbeat. Other symptoms may include: Being very tired. Feeling dizzy, or feeling like you may pass out (faint). Having no desire to eat. Feeling like you may vomit (nauseous). Peeing (urinating) more at night. Feeling confused. How is this treated? This condition may be treated with: Medicines. These can be given to treat blood pressure and to make the heart muscles stronger. Changes in your daily life. These may include: Eating a healthy  diet. Staying at a healthy body weight. Quitting tobacco, alcohol, and drug use. Doing exercises. Participating in a cardiac rehabilitation program. This program helps you improve your health through exercise, education, and counseling. Surgery. Surgery can be done to open blocked valves or to put devices in the heart, such as pacemakers. A donor heart (heart transplant). You will receive a healthy heart from a donor. Follow these instructions at home: Treat other conditions as told by your doctor. These may include high blood pressure, diabetes, thyroid disease, or abnormal heart rhythms. Learn as much as you can about heart failure. Get support as you need it. Keep all follow-up visits. Where to find more information American Heart Association: www.heart.org Centers for Disease Control and Prevention: www.cdc.gov National Institute on Aging: www.nia.nih.gov Summary Heart failure means that your heart is not able to pump blood in the right way. This condition is often caused by high blood pressure, heart attack, or damage of the heart muscle. Symptoms of this condition include shortness of breath and swelling of the feet, ankles, legs, or belly. You may also feel very tired or feel like you may vomit. You may be treated with medicines, surgery, or changes in your daily life. Treat other health conditions as told by your doctor. This information is not intended to replace advice given to you by your health care provider. Make sure you discuss any questions you have with your health care provider. Document Revised: 11/04/2020 Document Reviewed: 11/29/2019 Elsevier Patient Education  2024 Elsevier Inc.  

## 2023-01-01 NOTE — Progress Notes (Signed)
Subjective:  Patient ID: Linda Phelps, female    DOB: 02-09-1971  Age: 52 y.o. MRN: [id number]  CC: Hospitalization Follow-up   HPI Linda Phelps is a 52 y.o. year old female of Dr. Laural Benes with a history of hypertension, type 2 diabetes mellitus, hypercholesterolemia, hypothyroidism, GERD, CKD, HFrEF.  She is seen for transition of care visit after hospitalization from 12/18/2022 through 12/22/2022 at [facility] for acute respiratory failure with hypoxia, acute pulmonary edema, systolic CHF with hypertensive urgency. BNP was elevated and so was her high-sensitivity troponin, creatinine was 4.54 (up from baseline of 1.64-2.73).  Chest x-ray revealed bilateral airspace opacities.  She was treated with IV diuretics and empiric antibiotics.  Also received supplemental oxygen at 5 left. Echo revealed LVEF of 20 to 25%, global hypokinesis. Lisinopril and metformin were held during hospitalization due to renal failure, BiDil initiated.  Recommendation was to follow-up with nephrology and cardiology outpatient.  Creatinine at discharge was 5.68.  Interval History: Discussed the use of AI scribe software for clinical note transcription with the patient, who gave verbal consent to proceed.  She presents for a follow-up visit.The patient reports taking Bidil three times a day and experiencing headaches as a side effect, but wishes to continue the medication due to its benefits for the heart. The patient denies any current shortness of breath or swelling of the ankles and has a good exercise tolerance. Review of her chart revealed nonadherence with appointments but she states she was under the care of [facility] and when her insurance changed they would no longer see her.  The patient also has a history of diabetes and is currently taking glipizide. The patient was previously on Ozempic but needs a refill. The patient also takes levothyroxine for hypothyroidism with current thyroid levels  reported as normal.  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  She is requesting a letter so she can continue to remain in light duty.  The patient walks for exercise and checks blood sugars regularly with a meter.        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   CHF (congestive heart failure) (HCC)    Chronic right-sided low back pain without sciatica 07/11/2016   Chronic systolic heart failure (HCC) 09/21/2015   Diabetes mellitus type 2 in obese 07/03/2014   Diabetes mellitus without complication (HCC)    Ectopic pregnancy    Essential hypertension 07/03/2014   Generalized anxiety disorder 07/26/2015   GERD (gastroesophageal reflux disease) 07/26/2015   High cholesterol    History of noncompliance with medical treatment 09/21/2015   Overview:  Per cardiologist notes from [facility]  Formatting of this note might be different from the original. Per cardiologist notes from [facility]   Hordeolum externum (stye) 01/11/2016   Hyperlipidemia 07/03/2014   Hypertension    Hypothyroidism 07/03/2014   Long-term use of aspirin therapy 09/21/2015   Low back pain    Menstrual cycle problem 01/11/2016   Obesity    Obesity (BMI 30.0-34.9) 10/01/2015   Palpitations 07/26/2015   Poor dentition 04/13/2015   Thyroid disease    Vitamin D insufficiency 01/13/2016    Past Surgical History:  Procedure Laterality Date   APPENDECTOMY     CHOLECYSTECTOMY      Family History  Problem Relation Age of Onset   Diabetes Mother  Diabetes Maternal Grandmother    Colon cancer Maternal Uncle 63    Social History   Socioeconomic History   Marital status: Single    Spouse name: Not on file   Number of children: Not on file   Years of education: Not on file   Highest education level: Not on file  Occupational History   Not on file  Tobacco Use    Smoking status: Never   Smokeless tobacco: Never  Vaping Use   Vaping status: Never Used  Substance and Sexual Activity   Alcohol use: Yes    Alcohol/week: 0.0 standard drinks of alcohol    Comment: occ   Drug use: No   Sexual activity: Not Currently    Birth control/protection: None  Other Topics Concern   Not on file  Social History Narrative   She has a 75 you daughter   65 yo son in jail until 01/2017   Dating   Works as Advertising copywriter   Social Determinants of Corporate investment banker Strain: Not on file  Food Insecurity: No Food Insecurity (12/18/2022)   Hunger Vital Sign    Worried About Running Out of Food in the Last Year: Never true    Ran Out of Food in the Last Year: Never true  Transportation Needs: No Transportation Needs (12/18/2022)   PRAPARE - Administrator, Civil Service (Medical): No    Lack of Transportation (Non-Medical): No  Physical Activity: Not on file  Stress: Not on file  Social Connections: Not on file    Allergies  Allergen Reactions   Ibuprofen Swelling    Outpatient Medications Prior to Visit  Medication Sig Dispense Refill   amLODipine (NORVASC) 5 MG tablet Take 1 tablet (5 mg total) by mouth daily. 30 tablet 2   aspirin 81 MG tablet Take 1 tablet (81 mg total) by mouth daily. 90 tablet 6   Blood Glucose Monitoring Suppl (TRUE METRIX METER) w/Device KIT Use to check blood sugar 3 times daily. 1 kit 0   Blood Pressure Monitor DEVI Use to check blood pressure daily 1 each 0   cyclobenzaprine (FLEXERIL) 10 MG tablet Take 1 tablet (10 mg total) by mouth at bedtime. 30 tablet 0   furosemide (LASIX) 20 MG tablet Take 2 tablets (40 mg total) by mouth daily.     glucose blood (TRUE METRIX BLOOD GLUCOSE TEST) test strip Use to check blood sugar 3 times daily. 100 each 3   isosorbide-hydrALAZINE (BIDIL) 20-37.5 MG tablet Take 0.5 tablets by mouth 3 (three) times daily. 45 tablet 2   pantoprazole (PROTONIX) 40 MG tablet Take 1 tablet  (40 mg total) by mouth daily. 90 tablet 1   triamcinolone (KENALOG) 0.025 % ointment Apply 1 application. topically 2 (two) times daily. 60 g 0   TRUEplus Lancets 28G MISC Use to check blood sugar 3 times daily. 100 each 3   atorvastatin (LIPITOR) 10 MG tablet Take 1 tablet (10 mg total) by mouth daily. 90 tablet 1   carvedilol (COREG) 12.5 MG tablet Take 1 tablet (12.5 mg total) by mouth 2 (two) times daily with a meal.     glipiZIDE (GLUCOTROL XL) 5 MG 24 hr tablet Take 1 tablet by mouth once daily with breakfast 90 tablet 0   levothyroxine (SYNTHROID) 50 MCG tablet Take 1 tablet by mouth once daily 90 tablet 0   OZEMPIC, 0.25 OR 0.5 MG/DOSE, 2 MG/3ML SOPN Apply 2.5 mg topically once a week.  No facility-administered medications prior to visit.     ROS Review of Systems  Constitutional:  Negative for activity change and appetite change.  HENT:  Negative for sinus pressure and sore throat.   Respiratory:  Negative for chest tightness, shortness of breath and wheezing.   Cardiovascular:  Negative for chest pain and palpitations.  Gastrointestinal:  Negative for abdominal distention, abdominal pain and constipation.  Genitourinary: Negative.   Musculoskeletal: Negative.   Psychiatric/Behavioral:  Negative for behavioral problems and dysphoric mood.     Objective:  BP 117/73   Pulse 79   Ht 5\' 1"  (1.549 m)   Wt 198 lb (89.8 kg)   LMP 10/08/2013   SpO2 99%   BMI 37.41 kg/m      01/01/2023   10:53 AM 12/22/2022   11:22 AM 12/22/2022    8:49 AM  BP/Weight  Systolic BP 117 111 114  Diastolic BP 73 58 70  Wt. (Lbs) 198    BMI 37.41 kg/m2        Physical Exam Constitutional:      Appearance: She is well-developed.  Cardiovascular:     Rate and Rhythm: Normal rate.     Heart sounds: Normal heart sounds. No murmur heard. Pulmonary:     Effort: Pulmonary effort is normal.     Breath sounds: Normal breath sounds. No wheezing or rales.  Chest:     Chest wall: No  tenderness.  Abdominal:     General: Bowel sounds are normal. There is no distension.     Palpations: Abdomen is soft. There is no mass.     Tenderness: There is no abdominal tenderness.  Musculoskeletal:        General: Normal range of motion.     Right lower leg: No edema.     Left lower leg: No edema.  Neurological:     Mental Status: She is alert and oriented to person, place, and time.  Psychiatric:        Mood and Affect: Mood normal.        Latest Ref Rng & Units 12/22/2022    1:50 AM 12/21/2022    2:50 AM 12/20/2022   12:52 AM  CMP  Glucose 70 - 99 mg/dL 161  096  045   BUN 6 - 20 mg/dL 60  51  53   Creatinine 0.44 - 1.00 mg/dL 4.09  8.11  9.14   Sodium 135 - 145 mmol/L 136  135  138   Potassium 3.5 - 5.1 mmol/L 4.5  4.7  4.5   Chloride 98 - 111 mmol/L 105  104  105   CO2 22 - 32 mmol/L 22  21  23    Calcium 8.9 - 10.3 mg/dL 8.2  8.5  8.5     Lipid Panel     Component Value Date/Time   CHOL 206 (H) 09/26/2021 0937   TRIG 199 (H) 09/26/2021 0937   HDL 55 09/26/2021 0937   CHOLHDL 3.7 09/26/2021 0937   LDLCALC 116 (H) 09/26/2021 0937    CBC    Component Value Date/Time   WBC 7.2 12/22/2022 0150   RBC 3.50 (L) 12/22/2022 0150   HGB 10.5 (L) 12/22/2022 0150   HGB 12.4 09/26/2021 0937   HCT 32.3 (L) 12/22/2022 0150   HCT 38.1 09/26/2021 0937   PLT 296 12/22/2022 0150   PLT 313 09/26/2021 0937   MCV 92.3 12/22/2022 0150   MCV 90 09/26/2021 0937   MCH 30.0 12/22/2022 0150  MCHC 32.5 12/22/2022 0150   RDW 12.6 12/22/2022 0150   RDW 12.8 09/26/2021 0937   LYMPHSABS 3.7 12/18/2022 0502   LYMPHSABS 2.5 09/26/2021 0937   MONOABS 0.7 12/18/2022 0502   EOSABS 0.3 12/18/2022 0502   EOSABS 0.1 09/26/2021 0937   BASOSABS 0.1 12/18/2022 0502   BASOSABS 0.0 09/26/2021 5643    Lab Results  Component Value Date   HGBA1C 7.4 (A) 01/01/2023    Lab Results  Component Value Date   TSH 2.565 12/18/2022    Assessment & Plan:      Hypertensive heart  disease Recent hospitalization for pulmonary edema. Echocardiogram showed reduced ejection fraction (20-25%) compared to previous (60-65%). Currently on Bidil 3 times a day, which is causing headaches. No current shortness of breath or ankle swelling. -Continue Bydil 3 times a day. -Report persistent headaches to cardiologist at upcoming appointment. She is currently on Lasix and is euvolemic -She is of the opinion that Lasix contributed to her renal function. -She will discuss with her cardiologist and nephrologist regarding her dosing of Lasix  Hypertension and based 5 CKD secondary to type 2 diabetes mellitus Recent significant drop in kidney function (creatinine increased from 2.73 to 4.54 on admission, was 5.68 at discharge).  Metformin was stopped due to kidney function. -Repeat kidney function tests today. -Keep upcoming appointment with nephrologist.  Type II Diabetes Mellitus A1c 7.4. Currently on glipizide. Previously on Ozempic, but has not taken it for a while due to insurance issues. -Restart Ozempic with titration schedule (0.25mg  for 4 weeks, then 0.5mg  for 4 weeks, then 1mg ). -If persistent hypoglycemia occurs, reduce glipizide dose by half. -Refill glipizide prescription.  Hypothyroidism Thyroid levels are normal on current levothyroxine dose. -Continue current levothyroxine dose.  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 -Provide letter for employer stating patient is on light duty, with no heavy mopping or lifting over 30 pounds.          Meds ordered this encounter  Medications   Semaglutide,0.25 or 0.5MG /DOS, (OZEMPIC, 0.25 OR 0.5 MG/DOSE,) 2 MG/1.5ML SOPN    Sig: Inject 0.25 mg into the skin once a week. For 4 weeks then increase to 0.5mg     Dispense:  2 mL    Refill:  0   Semaglutide,0.25 or 0.5MG /DOS, (OZEMPIC, 0.25 OR 0.5 MG/DOSE,) 2 MG/3ML SOPN    Sig: Inject 0.5 mg into the  skin once a week. For 4 weeks then increase to 1mg     Dispense:  3 mL    Refill:  0   Semaglutide, 1 MG/DOSE, 4 MG/3ML SOPN    Sig: Inject 1 mg as directed once a week.    Dispense:  3 mL    Refill:  3   atorvastatin (LIPITOR) 10 MG tablet    Sig: Take 1 tablet (10 mg total) by mouth daily.    Dispense:  90 tablet    Refill:  1   carvedilol (COREG) 12.5 MG tablet    Sig: Take 1 tablet (12.5 mg total) by mouth 2 (two) times daily with a meal.    Dispense:  180 tablet    Refill:  1   glipiZIDE (GLUCOTROL XL) 5 MG 24 hr tablet    Sig: Take 1 tablet (5 mg total) by mouth daily with breakfast.    Dispense:  90 tablet    Refill:  1   levothyroxine (SYNTHROID) 50 MCG tablet    Sig: Take 1 tablet (50  mcg total) by mouth daily.    Dispense:  90 tablet    Refill:  1    Follow-up: Return in about 3 months (around 04/03/2023) for Medical conditions with PCP.       Hoy Register, MD, FAAFP. [facility] [place], [place] [phone]   01/01/2023, 12:29 PM

## 2023-01-01 NOTE — Telephone Encounter (Signed)
Patient wanting a home blood pressure monitor.  I have left a prescription on your desk.  She can come to pick up.

## 2023-01-01 NOTE — Telephone Encounter (Signed)
Patient is calling to request clarification to the letter written on her behalf. Letter needs to  state that the patient unable to lift more than 35lbs. No direct exposure to the sunlight. No extending walking for a period of 15 mins at a time. And no use of heavy equipment - brooms, mop buckets. Please advise when the letter is available for pickup [phone]

## 2023-01-02 NOTE — Telephone Encounter (Signed)
Pt was called and a VM was left informing patient that letter can be provided.

## 2023-01-02 NOTE — Telephone Encounter (Signed)
She informed me at her visit to insert in the note not to lift over 30 pounds, those were her words.  All the additional requests she is making unfortunately I am unable to justify. A mop, broom and bucket do not weigh up to 30 pounds.  The letter I previously gave her is all I will be providing. Thanks.

## 2023-01-03 ENCOUNTER — Other Ambulatory Visit: Payer: Self-pay

## 2023-01-03 NOTE — Telephone Encounter (Signed)
Called & spoke to the patient. Informed that prescription is ready for pick-up. Patient expressed verbal understanding

## 2023-01-05 NOTE — Progress Notes (Signed)
ADVANCED HF CLINIC CONSULT NOTE   Primary Care: Marcine Matar, MD Primary Cardiologist: Gypsy Balsam, MD HF Cardiologist: Dr. Shirlee Latch  HPI: 52 y.o. 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   She's had history of noncompliance with medications and follow-up. Looking back through her chart, her blood pressure has been uncontrolled for several years.    Echo 1/21: EF 60-65%, grade I DD, RV okay  Admitted 7/24 with a/c heart failure, hypertensive urgency and AKI on CKD. BP 194/119. She was given IV lasix. Echo showed EF 20-25%, moderately dilated LV, grade II DD, RV okay. With no CP and mild HsTroponin elevated, did not undergo LHC. Plan for cMRI when renal function stabilizes (small risk of nephrogenic sclerosing fibrosis). GDMT titrated and she was discharged home, weight 191 lbs.  Today she returns for post hospital HF follow up. Overall feeling fine. Having HA with BiDil, but manageable with taking Tylenol. Mostly limited by fatigue. She is back at work, and struggling with work duties. She has bendopnea, she has SOB walking further distances on flat ground. Feels occasional palpitations. Denies CP, dizziness, edema, or PND/Orthopnea. Appetite ok. No fever or chills. Weight at home 197 pounds. Taking all medications. Works as a Copy at [employer]. Rare ETOH use, no drug or tobacco use. No family hx of CHF. Would like to be referred to [facility]. She snores.  ECG (personally reviewed): NSR + LVH 79 bpm  ReDs: 28%  Labs (8/24): K 4.5, creatinine 5.68  Cardiac Studies   - Echo (7/24): EF 20-25%, moderately dilated LV, grade II DD, RV okay  - Echo (1/21): EF 60-65%, G1DD, normal RV  - Echo (7/19): EF 60-65%, G1DD, normal RV  - Echo (5/17): EF 55-60%, G1DD, normal RV  Review of Systems: [y] = yes, [ ]  = no   General: Weight gain [ ] ;  Weight loss [ ] ; Anorexia [ ] ; Fatigue Cove.Etienne ]; Fever [ ] ; Chills [ ] ; Weakness [ ]   Cardiac: Chest pain/pressure [ ] ; Resting SOB [ ] ; Exertional SOB Cove.Etienne ]; Orthopnea [ ] ; Pedal Edema [ ] ; Palpitations Cove.Etienne ]; Syncope [ ] ; Presyncope [ ] ; Paroxysmal nocturnal dyspnea[ ]   Pulmonary: Cough [ ] ; Wheezing[ ] ; Hemoptysis[ ] ; Sputum [ ] ; Snoring Cove.Etienne ]  GI: Vomiting[ ] ; Dysphagia[ ] ; Melena[ ] ; Hematochezia [ ] ; Heartburn[ ] ; Abdominal pain [ ] ; Constipation [ ] ; Diarrhea [ ] ; BRBPR [ ]   GU: Hematuria[ ] ; Dysuria [ ] ; Nocturia[ ]   Vascular: Pain in legs with walking [ ] ; Pain in feet with lying flat [ ] ; Non-healing sores [ ] ; Stroke [ ] ; TIA [ ] ; Slurred speech [ ] ;  Neuro: Headaches[y ]; Vertigo[ ] ; Seizures[ ] ; Paresthesias[ ] ;Blurred vision [ ] ; Diplopia [ ] ; Vision changes [ ]   Ortho/Skin: Arthritis [ ] ; Joint pain [ ] ; Muscle pain [ ] ; Joint swelling [ ] ; Back Pain [ ] ; Rash [ ]   Psych: Depression[ ] ; Anxiety[ ]   Heme: Bleeding problems [ ] ; Clotting disorders [ ] ; Anemia [ ]   Endocrine: Diabetes Cove.Etienne ]; Thyroid dysfunction[ ]   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   CHF (congestive heart failure) (HCC)    Chronic right-sided low back pain without sciatica 07/11/2016   Chronic systolic heart failure (HCC) 09/21/2015   Diabetes mellitus  type 2 in obese 07/03/2014   Diabetes mellitus without complication (HCC)    Ectopic pregnancy    Essential hypertension 07/03/2014   Generalized anxiety disorder 07/26/2015   GERD (gastroesophageal reflux disease) 07/26/2015   High cholesterol    History of noncompliance with medical treatment 09/21/2015   Overview:  Per cardiologist notes from [facility]  Formatting of this note might be different from the original. Per cardiologist notes from [facility]   Hordeolum externum (stye) 01/11/2016   Hyperlipidemia 07/03/2014   Hypertension    Hypothyroidism  07/03/2014   Long-term use of aspirin therapy 09/21/2015   Low back pain    Menstrual cycle problem 01/11/2016   Obesity    Obesity (BMI 30.0-34.9) 10/01/2015   Palpitations 07/26/2015   Poor dentition 04/13/2015   Thyroid disease    Vitamin D insufficiency 01/13/2016   Current Outpatient Medications  Medication Sig Dispense Refill   amLODipine (NORVASC) 5 MG tablet Take 1 tablet (5 mg total) by mouth daily. 30 tablet 2   aspirin 81 MG tablet Take 1 tablet (81 mg total) by mouth daily. 90 tablet 6   atorvastatin (LIPITOR) 10 MG tablet Take 1 tablet (10 mg total) by mouth daily. 90 tablet 1   Blood Glucose Monitoring Suppl (TRUE METRIX METER) w/Device KIT Use to check blood sugar 3 times daily. 1 kit 0   Blood Pressure Monitor DEVI Use to check blood pressure daily 1 each 0   carvedilol (COREG) 12.5 MG tablet Take 1 tablet (12.5 mg total) by mouth 2 (two) times daily with a meal. 180 tablet 1   cyclobenzaprine (FLEXERIL) 10 MG tablet Take 1 tablet (10 mg total) by mouth at bedtime. 30 tablet 0   furosemide (LASIX) 20 MG tablet Take 2 tablets (40 mg total) by mouth daily.     glipiZIDE (GLUCOTROL XL) 5 MG 24 hr tablet Take 1 tablet (5 mg total) by mouth daily with breakfast. 90 tablet 1   glucose blood (TRUE METRIX BLOOD GLUCOSE TEST) test strip Use to check blood sugar 3 times daily. 100 each 3   isosorbide-hydrALAZINE (BIDIL) 20-37.5 MG tablet Take 0.5 tablets by mouth 3 (three) times daily. 45 tablet 2   levothyroxine (SYNTHROID) 50 MCG tablet Take 1 tablet (50 mcg total) by mouth daily. 90 tablet 1   pantoprazole (PROTONIX) 40 MG tablet Take 1 tablet (40 mg total) by mouth daily. 90 tablet 1   Semaglutide,0.25 or 0.5MG /DOS, (OZEMPIC, 0.25 OR 0.5 MG/DOSE,) 2 MG/1.5ML SOPN Inject 0.25 mg into the skin once a week. For 4 weeks then increase to 0.5mg  2 mL 0   triamcinolone (KENALOG) 0.025 % ointment Apply 1 application. topically 2 (two) times daily. 60 g 0   TRUEplus Lancets 28G MISC Use to check  blood sugar 3 times daily. 100 each 3   No current facility-administered medications for this encounter.   Allergies  Allergen Reactions   Ibuprofen Swelling   Social History   Socioeconomic History   Marital status: Single    Spouse name: Not on file   Number of children: Not on file   Years of education: Not on file   Highest education level: Not on file  Occupational History   Not on file  Tobacco Use   Smoking status: Never   Smokeless tobacco: Never  Vaping Use   Vaping status: Never Used  Substance and Sexual Activity   Alcohol use: Yes    Alcohol/week: 0.0 standard drinks of alcohol  Comment: occ   Drug use: No   Sexual activity: Not Currently    Birth control/protection: None  Other Topics Concern   Not on file  Social History Narrative   She has a 71 you daughter   13 yo son in jail until 01/2017   Dating   Works as housekeeper   Social Determinants of Corporate investment banker Strain: Not on file  Food Insecurity: No Food Insecurity (12/18/2022)   Hunger Vital Sign    Worried About Running Out of Food in the Last Year: Never true    Ran Out of Food in the Last Year: Never true  Transportation Needs: No Transportation Needs (12/18/2022)   PRAPARE - Administrator, Civil Service (Medical): No    Lack of Transportation (Non-Medical): No  Physical Activity: Not on file  Stress: Not on file  Social Connections: Not on file  Intimate Partner Violence: Not At Risk (12/18/2022)   Humiliation, Afraid, Rape, and Kick questionnaire    Fear of Current or Ex-Partner: No    Emotionally Abused: No    Physically Abused: No    Sexually Abused: No   Family History  Problem Relation Age of Onset   Diabetes Mother    Diabetes Maternal Grandmother    Colon cancer Maternal Uncle 65   BP 130/82   Pulse 84   Wt 89.7 kg (197 lb 12.8 oz)   LMP 10/08/2013   SpO2 98%   BMI 37.37 kg/m   Wt Readings from Last 3 Encounters:  01/08/23 89.7 kg (197 lb  12.8 oz)  01/01/23 89.8 kg (198 lb)  12/22/22 87 kg (191 lb 14.4 oz)   PHYSICAL EXAM: General:  NAD. No resp difficulty, walked into clinic HEENT: Normal Neck: Supple. No JVD, thick neck. Carotids 2+ bilat; no bruits. No lymphadenopathy or thryomegaly appreciated. Cor: PMI nondisplaced. Regular rate & rhythm. No rubs, gallops or murmurs. Lungs: Clear Abdomen: Soft, nontender, nondistended. No hepatosplenomegaly. No bruits or masses. Good bowel sounds. Extremities: No cyanosis, clubbing, rash, edema Neuro: Alert & oriented x 3, cranial nerves grossly intact. Moves all 4 extremities w/o difficulty. Affect pleasant.  ASSESSMENT & PLAN: 1. Chronic systolic CHF:  Echo in 7/24 with EF 20-25% with moderate LV dilation but normal wall thickness, normal RV, IVC not dilated. In setting of long-standing HTN, DM2, and CKD stage 3.  Cause is uncertain, possibly due to long-standing HTN but walls are not thick. Poorly controlled DM could play a role.  She was said to have HF when her daughter was born but do not have echo available from then and echoes in 2019 and 2021 showed normal EF.  No ETOH or drugs.  She did have a brother who had a cardiac arrest, but he had multiple medical problems.  No chest pain and mild HS-TnI elevation with no trend suggests no ACS (demand ischemia with volume overload on admission). NYHA IIb symptoms, she is not volume overloaded on exam. GDMT limited by CKD. - Continue Lasix 40 mg daily. - Continue Coreg 12.5 mg bid.   - Continue Bidil 1/2 tablet tid  - GFR too low to start SGLT2 inhibitor.  - Ideally, would have cardiac MRI to assess for infiltrative disease/prior MI. Think we can do this when creatinine has stabilized out.  Risk of nephrogenic sclerosing fibrosis is small, but would like to see renal function stable prior to giving contrast. Will discuss timing of cMRI with Dr. Shirlee Latch. - Discussed Cardiac Rehab, I  think she would benefit from this. Will refer to CR in [place]. -  Repeat echo in 3 months. 2. HTN: BP is now controlled.  Hard to know how much HTN contributed to cardiomyopathy as she has been a long-time hypertensive and prior echoes have been normal.  Also, LV walls are not particularly thick.  - GDMT per above  - Continue amlodipine for now. Favor stopping down the road to push GDMT. 3. Type 2 diabetes: She is on semaglutide and glipizide  4. CKD IV: Prior creatinine was 2.73 in 5/23. New baseline creatinine 5.4. Has follow up with nephrology in [place], but requesting referral to [facility]. Will arrange. 5. Snoring: suspect OSA. Arrange in lab sleep study.  Follow up in 3 weeks with PharmD (increase Coreg), 6 weeks with APP, and 12 weeks with Dr. Shirlee Latch + echo.  She was given a note for employer today.   Prince Rome, FNP-BC 01/08/23

## 2023-01-08 ENCOUNTER — Encounter (HOSPITAL_COMMUNITY): Payer: Self-pay

## 2023-01-08 ENCOUNTER — Ambulatory Visit (HOSPITAL_COMMUNITY)
Admit: 2023-01-08 | Discharge: 2023-01-08 | Disposition: A | Discharge status: Home / Self Care | Payer: Medicaid Other | Attending: Family Medicine | Admitting: Family Medicine

## 2023-01-08 VITALS — BP 130/82 | HR 84 | Wt 197.8 lb

## 2023-01-08 DIAGNOSIS — I5022 Chronic systolic (congestive) heart failure: Secondary | ICD-10-CM | POA: Diagnosis not present

## 2023-01-08 DIAGNOSIS — I10 Essential (primary) hypertension: Secondary | ICD-10-CM | POA: Diagnosis not present

## 2023-01-08 DIAGNOSIS — I252 Old myocardial infarction: Secondary | ICD-10-CM | POA: Insufficient documentation

## 2023-01-08 DIAGNOSIS — N184 Chronic kidney disease, stage 4 (severe): Secondary | ICD-10-CM

## 2023-01-08 DIAGNOSIS — Z7984 Long term (current) use of oral hypoglycemic drugs: Secondary | ICD-10-CM | POA: Diagnosis not present

## 2023-01-08 DIAGNOSIS — Z79899 Other long term (current) drug therapy: Secondary | ICD-10-CM | POA: Diagnosis not present

## 2023-01-08 DIAGNOSIS — E669 Obesity, unspecified: Secondary | ICD-10-CM | POA: Insufficient documentation

## 2023-01-08 DIAGNOSIS — I130 Hypertensive heart and chronic kidney disease with heart failure and stage 1 through stage 4 chronic kidney disease, or unspecified chronic kidney disease: Secondary | ICD-10-CM | POA: Diagnosis not present

## 2023-01-08 DIAGNOSIS — E1122 Type 2 diabetes mellitus with diabetic chronic kidney disease: Secondary | ICD-10-CM

## 2023-01-08 DIAGNOSIS — R0683 Snoring: Secondary | ICD-10-CM

## 2023-01-08 DIAGNOSIS — E1165 Type 2 diabetes mellitus with hyperglycemia: Secondary | ICD-10-CM | POA: Insufficient documentation

## 2023-01-08 DIAGNOSIS — I160 Hypertensive urgency: Secondary | ICD-10-CM | POA: Insufficient documentation

## 2023-01-08 LAB — BASIC METABOLIC PANEL
Anion gap: 12 (ref 5–15)
BUN: 60 mg/dL — ABNORMAL HIGH (ref 6–20)
CO2: 20 mmol/L — ABNORMAL LOW (ref 22–32)
Calcium: 8.7 mg/dL — ABNORMAL LOW (ref 8.9–10.3)
Chloride: 107 mmol/L (ref 98–111)
Creatinine, Ser: 5.43 mg/dL — ABNORMAL HIGH (ref 0.44–1.00)
GFR, Estimated: 9 mL/min — ABNORMAL LOW (ref >60)
Glucose, Bld: 115 mg/dL — ABNORMAL HIGH (ref 70–99)
Potassium: 4.7 mmol/L (ref 3.5–5.1)
Sodium: 139 mmol/L (ref 135–145)

## 2023-01-08 LAB — BRAIN NATRIURETIC PEPTIDE: B Natriuretic Peptide: 268.8 pg/mL — ABNORMAL HIGH (ref 0.0–100.0)

## 2023-01-08 NOTE — Progress Notes (Signed)
ReDS Vest / Clip - 01/08/23 0900       ReDS Vest / Clip   Station Marker B    Ruler Value 35    ReDS Value Range Low volume    ReDS Actual Value 28    Anatomical Comments sitting

## 2023-01-08 NOTE — Patient Instructions (Addendum)
Reds Clip done today.  EKG done today.  Labs done today. We will contact you only if your labs are abnormal.  No medication changes were made. Please continue all current medications as prescribed.  You have been referred to [facility], Cardiac Rehab and to the sleep center. All offices will contact you to schedule an appointment.   Your physician recommends that you schedule a follow-up appointment in: 3 weeks with our Clinic Pharmacist, 6 weeks with our NP/PA Clinic and in 12 weeks with Dr. Shirlee Latch with an echo prior to your appointment.   If you have any questions or concerns before your next appointment please send [place] a message through [place] or call our office at [phone].    TO LEAVE A MESSAGE FOR THE NURSE SELECT OPTION 2, PLEASE LEAVE A MESSAGE INCLUDING: YOUR NAME DATE OF BIRTH CALL BACK NUMBER REASON FOR CALL**this is important as we prioritize the call backs  YOU WILL RECEIVE A CALL BACK THE SAME DAY AS LONG AS YOU CALL BEFORE 4:00 PM   Do the following things EVERYDAY: Weigh yourself in the morning before breakfast. Write it down and keep it in a log. Take your medicines as prescribed Eat low salt foods--Limit salt (sodium) to 2000 mg per day.  Stay as active as you can everyday Limit all fluids for the day to less than 2 liters   At the [facility], you and your health needs are our priority. As part of our continuing mission to provide you with exceptional heart care, we have created designated Provider Care Teams. These Care Teams include your primary Cardiologist (physician) and Advanced Practice Providers (APPs- Physician Assistants and Nurse Practitioners) who all work together to provide you with the care you need, when you need it.   You may see any of the following providers on your designated Care Team at your next follow up: Dr Arvilla Meres Dr Marca Ancona Dr. Marcos Eke, NP Robbie Lis, [place] [facility] [place], [place] Brynda Peon, NP Karle Plumber, PharmD   Please be sure to bring in all your medications bottles to every appointment.    Thank you for choosing [facility]

## 2023-01-09 ENCOUNTER — Telehealth (INDEPENDENT_AMBULATORY_CARE_PROVIDER_SITE_OTHER): Payer: Self-pay | Admitting: Internal Medicine

## 2023-01-09 NOTE — Telephone Encounter (Signed)
Copied from CRM [id number]. Topic: General - Other >> Jan 09, 2023  3:23 PM Macon Large wrote: Reason for CRM: Pt stated she will be in [facility] tomorrow and would like to ask Franky Macho to show her how to administer the Ozempic pen. Pt stated she is scheduled to take it tomorrow so she really would like for [facility] to return her call asap. Cb# [phone]

## 2023-01-15 ENCOUNTER — Other Ambulatory Visit: Payer: Self-pay | Admitting: Nurse Practitioner

## 2023-01-31 ENCOUNTER — Other Ambulatory Visit: Payer: Self-pay | Admitting: Internal Medicine

## 2023-01-31 DIAGNOSIS — I10 Essential (primary) hypertension: Secondary | ICD-10-CM

## 2023-01-31 NOTE — Telephone Encounter (Unsigned)
Copied from CRM [id number]. Topic: General - Other >> Jan 31, 2023  5:03 PM Linda Phelps wrote: Reason for CRM: Medication Refill - Medication: Semaglutide,0.25 or 0.5MG /DOS, (OZEMPIC, 0.25 OR 0.5 MG/DOSE,) 2 MG/1.5ML SOPN [[id number]]  amLODipine (NORVASC) 5 MG tablet [[id number]]  furosemide (LASIX) 20 MG tablet [[id number]]  Has the patient contacted their pharmacy? Yes.   (Agent: If no, request that the patient contact the pharmacy for the refill. If patient does not wish to contact the pharmacy document the reason why and proceed with request.) (Agent: If yes, when and what did the pharmacy advise?)  Preferred Pharmacy (with phone number or street name): [id number] - [place], [place] - [address] [place] [address] Phone: [phone] Fax: [phone] Hours: Not open 24 hours   Has the patient been seen for an appointment in the last year OR does the patient have an upcoming appointment? Yes.    Agent: Please be advised that RX refills may take up to 3 business days. We ask that you follow-up with your pharmacy.

## 2023-02-01 MED ORDER — OZEMPIC (0.25 OR 0.5 MG/DOSE) 2 MG/1.5ML ~~LOC~~ SOPN: 0.2500 mg | PEN_INJECTOR | SUBCUTANEOUS | 0 refills | Status: DC

## 2023-02-01 NOTE — Telephone Encounter (Signed)
Requested medication (s) are due for refill today: yes  Requested medication (s) are on the active medication list: yes  Last refill:  12/22/22  Future visit scheduled: yes  Notes to clinic:  Unable to refill per protocol, last refill by another provider.      Requested Prescriptions  Pending Prescriptions Disp Refills   Semaglutide,0.25 or 0.5MG /DOS, (OZEMPIC, 0.25 OR 0.5 MG/DOSE,) 2 MG/1.5ML SOPN 2 mL 0    Sig: Inject 0.25 mg into the skin once a week. For 4 weeks then increase to 0.5mg      Endocrinology:  Diabetes - GLP-1 Receptor Agonists - semaglutide Failed - 02/01/2023  8:36 AM      Failed - HBA1C in normal range and within 180 days    HbA1c, POC (controlled diabetic range)  Date Value Ref Range Status  01/01/2023 7.4 (A) 0.0 - 7.0 % Final         Failed - Cr in normal range and within 360 days    Creat  Date Value Ref Range Status  07/11/2016 1.21 (H) 0.50 - 1.10 mg/dL Final   Creatinine, Ser  Date Value Ref Range Status  01/08/2023 5.43 (H) 0.44 - 1.00 mg/dL Final   Creatinine, POC  Date Value Ref Range Status  01/08/2017 100 mg/dL Final   Creatinine, Urine  Date Value Ref Range Status  04/13/2015 34 20 - 320 mg/dL Final         Passed - Valid encounter within last 6 months    Recent Outpatient Visits           1 month ago Type 2 diabetes mellitus with hyperglycemia, without long-term current use of insulin (HCC)   [place] Hoy Register, MD   1 year ago Essential hypertension   [facility] [place], Shea Stakes, NP   2 years ago Primary hypertension   [place] [facility], Shea Stakes, NP   2 years ago Pure hypercholesterolemia   [place] [facility] [place], [place], [place]   2 years ago Heel spur, right   [place] Marcine Matar, MD       Future Appointments             In 1 month  Claiborne Rigg, NP [facility]             amLODipine (NORVASC) 5 MG tablet 30 tablet 2    Sig: Take 1 tablet (5 mg total) by mouth daily.     Cardiovascular: Calcium Channel Blockers 2 Passed - 02/01/2023  8:36 AM      Passed - Last BP in normal range    BP Readings from Last 1 Encounters:  01/08/23 130/82         Passed - Last Heart Rate in normal range    Pulse Readings from Last 1 Encounters:  01/08/23 84         Passed - Valid encounter within last 6 months    Recent Outpatient Visits           1 month ago Type 2 diabetes mellitus with hyperglycemia, without long-term current use of insulin (HCC)   [place] [facility] Hoy Register, MD   1 year ago Essential hypertension    [facility] [place], Shea Stakes, NP   2 years ago  Primary hypertension   [place], Shea Stakes, NP   2 years ago Pure hypercholesterolemia   [place] [place], [place], [place]   2 years ago Heel spur, right   [facility] Marcine Matar, MD       Future Appointments             In 1 month Claiborne Rigg, NP [facility]             furosemide (LASIX) 20 MG tablet      Sig: Take 2 tablets (40 mg total) by mouth daily.     Cardiovascular:  Diuretics - Loop Failed - 02/01/2023  8:36 AM      Failed - Ca in normal range and within 180 days    Calcium  Date Value Ref Range Status  01/08/2023 8.7 (L) 8.9 - 10.3 mg/dL Final   Calcium, Ion  Date Value Ref Range Status  12/18/2022 1.10 (L) 1.15 - 1.40 mmol/L Final         Failed - Cr in normal range and within 180 days    Creat  Date Value Ref Range Status  07/11/2016 1.21 (H) 0.50 - 1.10 mg/dL Final   Creatinine, Ser  Date Value Ref Range Status  01/08/2023 5.43 (H) 0.44 - 1.00 mg/dL  Final   Creatinine, POC  Date Value Ref Range Status  01/08/2017 100 mg/dL Final   Creatinine, Urine  Date Value Ref Range Status  04/13/2015 34 20 - 320 mg/dL Final         Passed - K in normal range and within 180 days    Potassium  Date Value Ref Range Status  01/08/2023 4.7 3.5 - 5.1 mmol/L Final         Passed - Na in normal range and within 180 days    Sodium  Date Value Ref Range Status  01/08/2023 139 135 - 145 mmol/L Final  01/01/2023 139 134 - 144 mmol/L Final         Passed - Cl in normal range and within 180 days    Chloride  Date Value Ref Range Status  01/08/2023 107 98 - 111 mmol/L Final         Passed - Mg Level in normal range and within 180 days    Magnesium  Date Value Ref Range Status  12/21/2022 2.2 1.7 - 2.4 mg/dL Final    Comment:    Performed at [facility], [address], [place], [place] [address]         Passed - Last BP in normal range    BP Readings from Last 1 Encounters:  01/08/23 130/82         Passed - Valid encounter within last 6 months    Recent Outpatient Visits           1 month ago Type 2 diabetes mellitus with hyperglycemia, without long-term current use of insulin [facility])   [place] [facility] Hoy Register, MD   1 year ago Essential hypertension   [place] [facility], Shea Stakes, NP   2 years ago Primary hypertension    [facility] [place], Shea Stakes, NP   2 years ago Pure hypercholesterolemia   [facility] [place], [place], [place]  2 years ago Heel spur, right   [facility] Marcine Matar, MD       Future Appointments             In 1 month Claiborne Rigg, NP [facility]

## 2023-02-02 ENCOUNTER — Other Ambulatory Visit (HOSPITAL_COMMUNITY): Payer: Self-pay

## 2023-02-02 DIAGNOSIS — I10 Essential (primary) hypertension: Secondary | ICD-10-CM

## 2023-02-02 MED ORDER — FUROSEMIDE 20 MG PO TABS
40.0000 mg | ORAL_TABLET | Freq: Every day | ORAL | 3 refills | Status: DC
Start: 2023-02-02 — End: 2023-04-13

## 2023-02-07 ENCOUNTER — Inpatient Hospital Stay (HOSPITAL_COMMUNITY)
Admission: RE | Admit: 2023-02-07 | Discharge: 2023-02-07 | Disposition: A | Discharge status: Home / Self Care | Payer: Medicaid Other | Source: Ambulatory Visit

## 2023-02-19 ENCOUNTER — Encounter (HOSPITAL_COMMUNITY): Payer: Medicaid Other

## 2023-03-05 ENCOUNTER — Ambulatory Visit: Payer: Medicaid Other | Admitting: Nurse Practitioner

## 2023-03-09 ENCOUNTER — Other Ambulatory Visit (HOSPITAL_COMMUNITY): Payer: Self-pay | Admitting: Cardiology

## 2023-03-09 DIAGNOSIS — I10 Essential (primary) hypertension: Secondary | ICD-10-CM

## 2023-03-09 MED ORDER — AMLODIPINE BESYLATE 5 MG PO TABS: 5.0000 mg | ORAL_TABLET | Freq: Every day | ORAL | 2 refills | Status: DC

## 2023-03-14 ENCOUNTER — Telehealth (HOSPITAL_COMMUNITY): Payer: Self-pay

## 2023-03-14 NOTE — Telephone Encounter (Signed)
Called to confirm/remind patient of their appointment at the [facility] on 03/15/23  Patient reminded to bring all medications and/or complete list.  Confirmed patient has transportation. Gave directions, instructed to utilize valet parking.  Confirmed appointment prior to ending call.

## 2023-03-15 ENCOUNTER — Encounter (HOSPITAL_COMMUNITY): Payer: Medicaid Other

## 2023-03-21 ENCOUNTER — Ambulatory Visit: Payer: Medicaid Other | Admitting: Nurse Practitioner

## 2023-04-10 ENCOUNTER — Ambulatory Visit (HOSPITAL_COMMUNITY): Admission: RE | Admit: 2023-04-10 | Payer: Medicaid Other | Source: Ambulatory Visit

## 2023-04-10 ENCOUNTER — Encounter (HOSPITAL_COMMUNITY): Payer: Medicaid Other | Admitting: Cardiology

## 2023-04-10 ENCOUNTER — Telehealth (HOSPITAL_COMMUNITY): Payer: Self-pay | Admitting: Cardiology

## 2023-04-10 NOTE — Telephone Encounter (Signed)
Pt has missed a few appts, today she stated her lift ride didn't show up, please advise

## 2023-04-11 ENCOUNTER — Telehealth (HOSPITAL_COMMUNITY): Payer: Self-pay | Admitting: Licensed Clinical Social Worker

## 2023-04-11 NOTE — Telephone Encounter (Signed)
CSW contacted patient to discuss concerns of multiple cancelled and no show appointments. Patient states that she has ongoing transportation issues. CSW shared that she has a transportation benefit through her insurance and educated patient on how to access benefit. CSW also encouraged patient to contact clinic if having further transportation concerns and unable to make appointments that staff can assist her. Patient verbalizes understanding and will call if further assistance needed. .CSW available as needed. Lasandra Beech, LCSW, CCSW-MCS [phone]

## 2023-04-13 ENCOUNTER — Other Ambulatory Visit (HOSPITAL_COMMUNITY): Payer: Self-pay | Admitting: Cardiology

## 2023-04-13 DIAGNOSIS — I10 Essential (primary) hypertension: Secondary | ICD-10-CM

## 2023-04-13 DIAGNOSIS — E7800 Pure hypercholesterolemia, unspecified: Secondary | ICD-10-CM

## 2023-04-13 DIAGNOSIS — E1122 Type 2 diabetes mellitus with diabetic chronic kidney disease: Secondary | ICD-10-CM

## 2023-04-13 MED ORDER — ATORVASTATIN CALCIUM 10 MG PO TABS: 10.0000 mg | ORAL_TABLET | Freq: Every day | ORAL | 2 refills | Status: DC

## 2023-04-13 MED ORDER — ISOSORB DINITRATE-HYDRALAZINE 20-37.5 MG PO TABS: 0.5000 | ORAL_TABLET | Freq: Three times a day (TID) | ORAL | 2 refills | Status: DC

## 2023-04-13 MED ORDER — AMLODIPINE BESYLATE 5 MG PO TABS
5.0000 mg | ORAL_TABLET | Freq: Every day | ORAL | 2 refills | Status: DC
Start: 2023-04-13 — End: 2023-05-09

## 2023-04-13 MED ORDER — CARVEDILOL 12.5 MG PO TABS
12.5000 mg | ORAL_TABLET | Freq: Two times a day (BID) | ORAL | 2 refills | Status: DC
Start: 2023-04-13 — End: 2023-05-09

## 2023-04-13 MED ORDER — FUROSEMIDE 20 MG PO TABS
40.0000 mg | ORAL_TABLET | Freq: Every day | ORAL | 2 refills | Status: DC
Start: 2023-04-13 — End: 2023-05-17

## 2023-04-30 ENCOUNTER — Emergency Department (HOSPITAL_BASED_OUTPATIENT_CLINIC_OR_DEPARTMENT_OTHER)
Admission: EM | Admit: 2023-04-30 | Discharge: 2023-04-30 | Discharge status: Against Medical Advice | Payer: Medicaid Other | Source: Home / Self Care

## 2023-05-07 NOTE — Progress Notes (Signed)
ADVANCED HF CLINIC NOTE   Primary Care: Marcine Matar, MD Primary Cardiologist: Gypsy Balsam, MD HF Cardiologist: Dr. Shirlee Latch  Chief complaint: Heart failure follow up   HPI: 52 y.o. 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   She's had history of noncompliance with medications and follow-up. Looking back through her chart, her blood pressure has been uncontrolled for several years.    Echo 1/21: EF 60-65%, grade I DD, RV okay  Admitted 7/24 with a/c heart failure, hypertensive urgency and AKI on CKD. BP 194/119. She was given IV lasix. Echo showed EF 20-25%, moderately dilated LV, grade II DD, RV okay. With no CP and mild HsTroponin elevated, did not undergo LHC. Plan for cMRI when renal function stabilizes (small risk of nephrogenic sclerosing fibrosis). GDMT titrated and she was discharged home, weight 191 lbs.  Today she returns for AHF follow up with her daughter. Overall feeling ok. Denies palpitations, CP, dizziness, edema, or PND/Orthopnea. No SOB. Appetite ok, limited with ozempic. No fever or chills. Taking all medications, no longer has headaches with bidil. Ran out of lasix for a couple of days. Works as a Copy at [employer]. Very tearful today when we discussed LV thrombus.   ECG (personally reviewed from 8/24): NSR + LVH 79 bpm  ReDs: 37%  Cardiac Studies - Echo 12/24: EF 30-35%, LV with GHK, GIIDD, RV normal, small pericardial effusion, LV apical thrombus, mild MR - Echo (7/24): EF 20-25%, moderately dilated LV, grade II DD, RV okay - Echo (1/21): EF 60-65%, G1DD, normal RV - Echo (7/19): EF 60-65%, G1DD, normal RV - Echo (5/17): EF 55-60%, G1DD, normal RV  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   CHF (congestive heart failure)  (HCC)    Chronic right-sided low back pain without sciatica 07/11/2016   Chronic systolic heart failure (HCC) 09/21/2015   Diabetes mellitus type 2 in obese 07/03/2014   Diabetes mellitus without complication (HCC)    Ectopic pregnancy    Essential hypertension 07/03/2014   Generalized anxiety disorder 07/26/2015   GERD (gastroesophageal reflux disease) 07/26/2015   High cholesterol    History of noncompliance with medical treatment 09/21/2015   Overview:  Per [facility]  Formatting of this note might be different from the original. Per cardiologist notes from [facility]   Hordeolum externum (stye) 01/11/2016   Hyperlipidemia 07/03/2014   Hypertension    Hypothyroidism 07/03/2014   Long-term use of aspirin therapy 09/21/2015   Low back pain    Menstrual cycle problem 01/11/2016   Obesity    Obesity (BMI 30.0-34.9) 10/01/2015   Palpitations 07/26/2015   Poor dentition 04/13/2015   Thyroid disease    Vitamin D insufficiency 01/13/2016   Current Outpatient Medications  Medication Sig Dispense Refill   amLODipine (NORVASC) 5 MG tablet Take 1 tablet (5 mg total) by mouth daily. 30 tablet 2   aspirin 81 MG tablet Take 1 tablet (81 mg total) by mouth daily. 90 tablet 6   atorvastatin (LIPITOR) 10 MG tablet Take 1 tablet (10 mg total) by mouth daily. 30 tablet 2   Blood Glucose Monitoring Suppl (TRUE METRIX METER) w/Device KIT Use to check blood sugar 3 times daily. 1 kit 0   Blood Pressure Monitor DEVI Use to  check blood pressure daily 1 each 0   carvedilol (COREG) 12.5 MG tablet Take 1 tablet (12.5 mg total) by mouth 2 (two) times daily with a meal. 60 tablet 2   cyclobenzaprine (FLEXERIL) 10 MG tablet Take 1 tablet (10 mg total) by mouth at bedtime. 30 tablet 0   furosemide (LASIX) 20 MG tablet Take 2 tablets (40 mg total) by mouth daily. 60 tablet 2   glipiZIDE (GLUCOTROL XL) 5 MG 24 hr tablet Take 1 tablet (5 mg total) by mouth daily with breakfast. 90 tablet 1    glucose blood (TRUE METRIX BLOOD GLUCOSE TEST) test strip Use to check blood sugar 3 times daily. 100 each 3   isosorbide-hydrALAZINE (BIDIL) 20-37.5 MG tablet Take 0.5 tablets by mouth 3 (three) times daily. 45 tablet 2   levothyroxine (SYNTHROID) 50 MCG tablet Take 1 tablet (50 mcg total) by mouth daily. 90 tablet 1   pantoprazole (PROTONIX) 40 MG tablet Take 1 tablet (40 mg total) by mouth daily. 90 tablet 1   Semaglutide,0.25 or 0.5MG /DOS, 2 MG/3ML SOPN Inject 0.5 mg into the skin once a week. 3 mL 3   triamcinolone (KENALOG) 0.025 % ointment APPLY ONE APPLICATION OF  OINTMENT TOPICALLY TWICE DAILY 60 g 0   TRUEplus Lancets 28G MISC Use to check blood sugar 3 times daily. 100 each 3   No current facility-administered medications for this encounter.   Allergies  Allergen Reactions   Ibuprofen Swelling   Social History   Socioeconomic History   Marital status: Single    Spouse name: Not on file   Number of children: Not on file   Years of education: Not on file   Highest education level: Not on file  Occupational History   Not on file  Tobacco Use   Smoking status: Never   Smokeless tobacco: Never  Vaping Use   Vaping status: Never Used  Substance and Sexual Activity   Alcohol use: Yes    Alcohol/week: 0.0 standard drinks of alcohol    Comment: occ   Drug use: No   Sexual activity: Not Currently    Birth control/protection: None  Other Topics Concern   Not on file  Social History Narrative   She has a 76 you daughter   78 yo son in jail until 01/2017   Dating   Works as Advertising copywriter   Social Drivers of Corporate investment banker Strain: Not on file  Food Insecurity: Low Risk  (05/02/2023)   Received from [facility]   Hunger Vital Sign    Worried About Running Out of Food in the Last Year: Never true    Ran Out of Food in the Last Year: Never true  Transportation Needs: No Transportation Needs (05/02/2023)   Received from [facility]     In the past 12 months, has lack of reliable transportation kept you from medical appointments, meetings, work or from getting things needed for daily living? : No  Physical Activity: Not on file  Stress: Not on file  Social Connections: Not on file  Intimate Partner Violence: Not At Risk (12/18/2022)   Humiliation, Afraid, Rape, and Kick questionnaire    Fear of Current or Ex-Partner: No    Emotionally Abused: No    Physically Abused: No    Sexually Abused: No   Family History  Problem Relation Age of Onset   Diabetes Mother    Diabetes Maternal Grandmother    Colon cancer Maternal Uncle 90  BP 132/78   Pulse 79   Ht 5\' 1"  (1.549 m)   Wt 84.3 kg (185 lb 12.8 oz)   LMP 10/08/2013   SpO2 99%   BMI 35.11 kg/m   Wt Readings from Last 3 Encounters:  05/17/23 84.3 kg (185 lb 12.8 oz)  05/09/23 85 kg (187 lb 6.4 oz)  01/08/23 89.7 kg (197 lb 12.8 oz)   PHYSICAL EXAM: General:  well appearing.  No respiratory difficulty. Walked into clinic HEENT: normal Neck: supple. JVD difficult to see. Carotids 2+ bilat; no bruits. No lymphadenopathy or thyromegaly appreciated. Cor: PMI nondisplaced. Regular rate & rhythm. No rubs, gallops or murmurs. Lungs: clear Abdomen: soft, nontender, nondistended. No hepatosplenomegaly. No bruits or masses. Good bowel sounds. Extremities: no cyanosis, clubbing, rash, trace BLE edema  Neuro: alert & oriented x 3, cranial nerves grossly intact. moves all 4 extremities w/o difficulty. Affect pleasant.   ASSESSMENT & PLAN: 1. Chronic systolic CHF:  Echo in 7/24 with EF 20-25% with moderate LV dilation but normal wall thickness, normal RV, IVC not dilated. In setting of long-standing HTN, DM2, and CKD stage 3.  Cause is uncertain, possibly due to long-standing HTN but walls are not thick. Poorly controlled DM could play a role.  She was said to have HF when her daughter was born but do not have echo available from then and echoes in 2019 and 2021 showed normal  EF.  No ETOH or drugs.  She did have a brother who had a cardiac arrest, but he had multiple medical problems.   - NYHA II-IIIa symptoms, volume mildly elevated on exam (suspect from being out of lasix for a couple of days). GDMT limited by CKD. - Echo 12/24 EF 30-35%, LV with GHK, GIIDD, RV normal, small pericardial effusion, LV apical thrombus, mild MR - Increase Lasix 40>60 mg daily for 3 days and then back to 40 mg daily. BMET/BNP today - Continue Coreg 12.5 mg bid.   - Increase Bidil 1/2>1 tablet tid  - GFR too low to start SGLT2 inhibitor.  - Ideally, would have cardiac MRI to assess for infiltrative disease/prior MI. Think we can do this when creatinine has stabilized out.  Risk of nephrogenic sclerosing fibrosis is small, but would like to see renal function stable prior to giving contrast. Will discuss timing of cMRI with Dr. Shirlee Latch. - Discussed Cardiac Rehab, I think she would benefit from this. Has been referred to [facility] in [place]. She has not heard from them, RN to reach out to them about getting pt scheduled.    2. LV apical thrombus - Echo 2 days ago showed new LV apical thromus - Start Eliquis 5 mg BID, baseline CBC today - Stop ASA with Eliquis - Will need repeat echo, will have her f/u with Dr. Shirlee Latch to discuss timing of repeat echo - Increased stroke risk discussed with patient and daughter  3. HTN: BP is now controlled.  Hard to know how much HTN contributed to cardiomyopathy as she has been a long-time hypertensive and prior echoes have been normal.  Also, LV walls are not particularly thick.  - GDMT per above  - Stop amlodipine, increase Bidil.  4. Type 2 diabetes: She is on semaglutide and glipizide. A1c 5.4 12/24  5. CKD IV: Prior creatinine was 2.73 in 5/23. New baseline creatinine 5.4. Has follow up with nephrology in [place], but requesting referral to [facility]. Will re-refer today as referral was placed a year ago.   6. Snoring: suspect OSA.  Arrange in lab sleep study, RN to reach  out to sleep study team for scheduling.   Note for work restrictions given today. Given 30 day free card for eliquis and co-pay card. Provided patient with scale and pill box.   Follow up with Dr. Shirlee Latch in 4-6 weeks to discuss timing of repeat echo.   Alen Bleacher AGACNP-BC  05/17/23

## 2023-05-09 ENCOUNTER — Ambulatory Visit: Payer: 59 | Attending: Physician Assistant | Admitting: Physician Assistant

## 2023-05-09 ENCOUNTER — Encounter: Payer: Self-pay | Admitting: Physician Assistant

## 2023-05-09 VITALS — BP 133/76 | HR 83 | Wt 187.4 lb

## 2023-05-09 DIAGNOSIS — E7800 Pure hypercholesterolemia, unspecified: Secondary | ICD-10-CM

## 2023-05-09 DIAGNOSIS — E1165 Type 2 diabetes mellitus with hyperglycemia: Secondary | ICD-10-CM | POA: Diagnosis not present

## 2023-05-09 DIAGNOSIS — E1122 Type 2 diabetes mellitus with diabetic chronic kidney disease: Secondary | ICD-10-CM

## 2023-05-09 DIAGNOSIS — M21611 Bunion of right foot: Secondary | ICD-10-CM

## 2023-05-09 DIAGNOSIS — E039 Hypothyroidism, unspecified: Secondary | ICD-10-CM

## 2023-05-09 DIAGNOSIS — I10 Essential (primary) hypertension: Secondary | ICD-10-CM

## 2023-05-09 DIAGNOSIS — Z7985 Long-term (current) use of injectable non-insulin antidiabetic drugs: Secondary | ICD-10-CM

## 2023-05-09 DIAGNOSIS — N185 Chronic kidney disease, stage 5: Secondary | ICD-10-CM

## 2023-05-09 DIAGNOSIS — I120 Hypertensive chronic kidney disease with stage 5 chronic kidney disease or end stage renal disease: Secondary | ICD-10-CM

## 2023-05-09 DIAGNOSIS — Z7984 Long term (current) use of oral hypoglycemic drugs: Secondary | ICD-10-CM

## 2023-05-09 LAB — POCT GLYCOSYLATED HEMOGLOBIN (HGB A1C): HbA1c, POC (controlled diabetic range): 5.4 % (ref 0.0–7.0)

## 2023-05-09 LAB — GLUCOSE, POCT (MANUAL RESULT ENTRY): POC Glucose: 149 mg/dL — AB (ref 70–99)

## 2023-05-09 MED ORDER — CARVEDILOL 12.5 MG PO TABS: 12.5000 mg | ORAL_TABLET | Freq: Two times a day (BID) | ORAL | 2 refills | Status: DC

## 2023-05-09 MED ORDER — ATORVASTATIN CALCIUM 10 MG PO TABS: 10.0000 mg | ORAL_TABLET | Freq: Every day | ORAL | 2 refills | Status: DC

## 2023-05-09 MED ORDER — AMLODIPINE BESYLATE 5 MG PO TABS: 5.0000 mg | ORAL_TABLET | Freq: Every day | ORAL | 2 refills | Status: DC

## 2023-05-09 MED ORDER — SEMAGLUTIDE(0.25 OR 0.5MG/DOS) 2 MG/3ML ~~LOC~~ SOPN: 0.5000 mg | PEN_INJECTOR | SUBCUTANEOUS | 3 refills | Status: DC

## 2023-05-09 MED ORDER — GLIPIZIDE ER 5 MG PO TB24: 5.0000 mg | ORAL_TABLET | Freq: Every day | ORAL | 1 refills | Status: DC

## 2023-05-09 MED ORDER — LEVOTHYROXINE SODIUM 50 MCG PO TABS: 50.0000 ug | ORAL_TABLET | Freq: Every day | ORAL | 1 refills | Status: DC

## 2023-05-09 MED ORDER — TRUE METRIX METER W/DEVICE KIT: PACK | 0 refills | Status: DC

## 2023-05-09 NOTE — Patient Instructions (Signed)

## 2023-05-09 NOTE — Progress Notes (Signed)
Patient ID: Linda Phelps, female   DOB: [birth year], 52 y.o.   MRN: [id number]   Linda Phelps, is a 52 y.o. female  ZHY:[id number]  [id number]  DOB - 02-Aug-1970  Chief Complaint  Patient presents with   Foot Swelling   Medication Refill       Subjective:   Linda Phelps is a 52 y.o. female here today for R foot pain.  She saw a chiropractor last week for her L foot(injured 11/11 when her mom pulled away in the car and her L leg got caught) but now her R foot is hurting since chiropractor treatment for the L foot 1 week ago.  She has been having trouble on and off for her R foot pain.  She has to be on her feet for work a lot.  This has been going on for a while but worse since she has been favoring her R foot.   No problems updated.  ALLERGIES: Allergies  Allergen Reactions   Ibuprofen Swelling    PAST MEDICAL HISTORY: 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   CHF (congestive heart failure) (HCC)    Chronic right-sided low back pain without sciatica 07/11/2016   Chronic systolic heart failure (HCC) 09/21/2015   Diabetes mellitus type 2 in obese 07/03/2014   Diabetes mellitus without complication (HCC)    Ectopic pregnancy    Essential hypertension 07/03/2014   Generalized anxiety disorder 07/26/2015   GERD (gastroesophageal reflux disease) 07/26/2015   High cholesterol    History of noncompliance with medical treatment 09/21/2015   Overview:  Per cardiologist notes from [facility]  Formatting of this note might be different from the original. Per cardiologist notes from [facility]   Hordeolum externum (stye) 01/11/2016   Hyperlipidemia 07/03/2014   Hypertension    Hypothyroidism 07/03/2014   Long-term use of aspirin therapy 09/21/2015   Low back pain    Menstrual cycle problem 01/11/2016   Obesity    Obesity (BMI 30.0-34.9) 10/01/2015   Palpitations 07/26/2015   Poor  dentition 04/13/2015   Thyroid disease    Vitamin D insufficiency 01/13/2016    MEDICATIONS AT HOME: Prior to Admission medications   Medication Sig Start Date End Date Taking? Authorizing Provider  aspirin 81 MG tablet Take 1 tablet (81 mg total) by mouth daily. 10/17/17  Yes Anders Simmonds, PA-C  Blood Pressure Monitor DEVI Use to check blood pressure daily 11/16/21  Yes Marcine Matar, MD  cyclobenzaprine (FLEXERIL) 10 MG tablet Take 1 tablet (10 mg total) by mouth at bedtime. 09/26/21  Yes Claiborne Rigg, NP  furosemide (LASIX) 20 MG tablet Take 2 tablets (40 mg total) by mouth daily. 04/13/23  Yes Bensimhon, Bevelyn Buckles, MD  glucose blood (TRUE METRIX BLOOD GLUCOSE TEST) test strip Use to check blood sugar 3 times daily. 04/21/21  Yes Marcine Matar, MD  isosorbide-hydrALAZINE (BIDIL) 20-37.5 MG tablet Take 0.5 tablets by mouth 3 (three) times daily. 04/13/23 04/12/24 Yes Bensimhon, Bevelyn Buckles, MD  pantoprazole (PROTONIX) 40 MG tablet Take 1 tablet (40 mg total) by mouth daily. 09/26/21  Yes Claiborne Rigg, NP  Semaglutide,0.25 or 0.5MG /DOS, 2 MG/3ML SOPN Inject 0.5 mg into the skin once a week. 05/09/23  Yes Georgian Co M, PA-C  triamcinolone (KENALOG) 0.025 % ointment APPLY ONE APPLICATION OF  OINTMENT TOPICALLY TWICE DAILY 01/16/23  Yes Marcine Matar, MD  TRUEplus Lancets  28G MISC Use to check blood sugar 3 times daily. 04/21/21  Yes Marcine Matar, MD  amLODipine (NORVASC) 5 MG tablet Take 1 tablet (5 mg total) by mouth daily. 05/09/23 05/08/24  Anders Simmonds, PA-C  atorvastatin (LIPITOR) 10 MG tablet Take 1 tablet (10 mg total) by mouth daily. 05/09/23   Anders Simmonds, PA-C  Blood Glucose Monitoring Suppl (TRUE METRIX METER) w/Device KIT Use to check blood sugar 3 times daily. 05/09/23   Anders Simmonds, PA-C  carvedilol (COREG) 12.5 MG tablet Take 1 tablet (12.5 mg total) by mouth 2 (two) times daily with a meal. 05/09/23   Athenia Rys, Marzella Schlein, PA-C  glipiZIDE  (GLUCOTROL XL) 5 MG 24 hr tablet Take 1 tablet (5 mg total) by mouth daily with breakfast. 05/09/23   Anders Simmonds, PA-C  levothyroxine (SYNTHROID) 50 MCG tablet Take 1 tablet (50 mcg total) by mouth daily. 05/09/23   Cledith Kamiya, Marzella Schlein, PA-C    ROS: Neg HEENT Neg resp Neg cardiac Neg GI Neg GU Neg MS Neg psych Neg neuro  Objective:   Vitals:   05/09/23 1019  BP: 133/76  Pulse: 83  SpO2: 99%  Weight: 187 lb 6.4 oz (85 kg)   Exam General appearance : Awake, alert, not in any distress. Speech Clear. Not toxic looking HEENT: Atraumatic and Normocephalic Neck: Supple, no JVD. No cervical lymphadenopathy.  Chest: Good air entry bilaterally, CTAB.  No rales/rhonchi/wheezing CVS: S1 S2 regular, no murmurs.  Extremities: B/L Lower Ext shows no edema, both legs are warm to touch R foot with tender bunion but not very pronounced.  N-V intact.  No ope skin.  Nails are thickened and too long and likely infected with nail fungus.   Neurology: Awake alert, and oriented X 3, CN II-XII intact, Non focal Skin: No Rash  Data Review Lab Results  Component Value Date   HGBA1C 5.4 05/09/2023   HGBA1C 7.4 (A) 01/01/2023   HGBA1C 6.7 (H) 09/26/2021    Assessment & Plan   1. Type 2 diabetes mellitus with hyperglycemia, without long-term current use of insulin (HCC) (Primary) Controlled on ozempic - Glucose (CBG) - HgB A1c - glipiZIDE (GLUCOTROL XL) 5 MG 24 hr tablet; Take 1 tablet (5 mg total) by mouth daily with breakfast.  Dispense: 90 tablet; Refill: 1 - Blood Glucose Monitoring Suppl (TRUE METRIX METER) w/Device KIT; Use to check blood sugar 3 times daily.  Dispense: 1 kit; Refill: 0 - Semaglutide,0.25 or 0.5MG /DOS, 2 MG/3ML SOPN; Inject 0.5 mg into the skin once a week.  Dispense: 3 mL; Refill: 3  2. Pure hypercholesterolemia - atorvastatin (LIPITOR) 10 MG tablet; Take 1 tablet (10 mg total) by mouth daily.  Dispense: 30 tablet; Refill: 2  3. Essential hypertension -  amLODipine (NORVASC) 5 MG tablet; Take 1 tablet (5 mg total) by mouth daily.  Dispense: 30 tablet; Refill: 2  4. Hypertension associated with stage 5 chronic kidney disease due to type 2 diabetes mellitus (HCC) - carvedilol (COREG) 12.5 MG tablet; Take 1 tablet (12.5 mg total) by mouth 2 (two) times daily with a meal.  Dispense: 60 tablet; Refill: 2  5. Hypothyroidism, unspecified typ - Thyroid Panel With TSH - levothyroxine (SYNTHROID) 50 MCG tablet; Take 1 tablet (50 mcg total) by mouth daily.  Dispense: 90 tablet; Refill: 1  6. Long term current use of oral hypoglycemic drug - glipiZIDE (GLUCOTROL XL) 5 MG 24 hr tablet; Take 1 tablet (5 mg total) by mouth daily with breakfast.  Dispense: 90 tablet; Refill: 1  7. Long-term current use of injectable noninsulin antidiabetic medication - Semaglutide,0.25 or 0.5MG /DOS, 2 MG/3ML SOPN; Inject 0.5 mg into the skin once a week.  Dispense: 3 mL; Refill: 3  8. Bunion of great toe of right foot - Ambulatory referral to Podiatry  Sees cardiology 05/17/2023  Return in about 4 months (around 09/07/2023) for PCP for chronic conditions-[place].  The patient was given clear instructions to go to ER or return to medical center if symptoms don't improve, worsen or new problems develop. The patient verbalized understanding. The patient was told to call to get lab results if they haven't heard anything in the next week.      Georgian Co, PA-C [facility] [place], [place] [phone]   05/09/2023, 10:44 AM

## 2023-05-10 ENCOUNTER — Telehealth: Payer: Self-pay

## 2023-05-10 LAB — THYROID PANEL WITH TSH
Free Thyroxine Index: 2 (ref 1.2–4.9)
T3 Uptake Ratio: 28 % (ref 24–39)
T4, Total: 7.1 ug/dL (ref 4.5–12.0)
TSH: 2.75 u[IU]/mL (ref 0.450–4.500)

## 2023-05-10 NOTE — Telephone Encounter (Signed)
Pt was called and is aware of results, DOB was confirmed.  ?

## 2023-05-10 NOTE — Telephone Encounter (Signed)
-----   Message from Georgian Co sent at 05/10/2023  8:42 AM EST ----- Please call patient.  Thyroid levels are perfect on current dose of medication!  Thanks, Georgian Co, PA-C

## 2023-05-15 ENCOUNTER — Telehealth (HOSPITAL_COMMUNITY): Payer: Self-pay

## 2023-05-15 ENCOUNTER — Encounter (HOSPITAL_COMMUNITY): Payer: Medicaid Other

## 2023-05-15 ENCOUNTER — Ambulatory Visit (HOSPITAL_COMMUNITY)
Admission: RE | Admit: 2023-05-15 | Discharge: 2023-05-15 | Disposition: A | Discharge status: Home / Self Care | Payer: 59 | Source: Ambulatory Visit | Attending: Family Medicine | Admitting: Family Medicine

## 2023-05-15 ENCOUNTER — Other Ambulatory Visit (HOSPITAL_COMMUNITY): Payer: Medicaid Other

## 2023-05-15 DIAGNOSIS — E119 Type 2 diabetes mellitus without complications: Secondary | ICD-10-CM | POA: Insufficient documentation

## 2023-05-15 DIAGNOSIS — I509 Heart failure, unspecified: Secondary | ICD-10-CM | POA: Diagnosis not present

## 2023-05-15 DIAGNOSIS — I3139 Other pericardial effusion (noninflammatory): Secondary | ICD-10-CM | POA: Diagnosis not present

## 2023-05-15 DIAGNOSIS — I110 Hypertensive heart disease with heart failure: Secondary | ICD-10-CM | POA: Diagnosis not present

## 2023-05-15 DIAGNOSIS — I5022 Chronic systolic (congestive) heart failure: Secondary | ICD-10-CM

## 2023-05-15 MED ORDER — PERFLUTREN LIPID MICROSPHERE
1.0000 mL | INTRAVENOUS | Status: AC | PRN
  Administered 2023-05-15: 4 mL via INTRAVENOUS

## 2023-05-15 NOTE — Telephone Encounter (Signed)
Called to confirm/remind patient of their appointment at the [facility] on 05/17/23.   Patient reminded to bring all medications and/or complete list.  Confirmed patient has transportation. Gave directions, instructed to utilize valet parking.  Confirmed appointment prior to ending call.

## 2023-05-17 ENCOUNTER — Ambulatory Visit (HOSPITAL_COMMUNITY)
Admission: RE | Admit: 2023-05-17 | Discharge: 2023-05-17 | Disposition: A | Discharge status: Home / Self Care | Payer: 59 | Source: Ambulatory Visit | Attending: Internal Medicine | Admitting: Internal Medicine

## 2023-05-17 ENCOUNTER — Other Ambulatory Visit (HOSPITAL_COMMUNITY): Payer: Self-pay

## 2023-05-17 ENCOUNTER — Encounter (HOSPITAL_COMMUNITY): Payer: Self-pay

## 2023-05-17 VITALS — BP 132/78 | HR 79 | Ht 61.0 in | Wt 185.8 lb

## 2023-05-17 DIAGNOSIS — E1122 Type 2 diabetes mellitus with diabetic chronic kidney disease: Secondary | ICD-10-CM | POA: Diagnosis not present

## 2023-05-17 DIAGNOSIS — Z79899 Other long term (current) drug therapy: Secondary | ICD-10-CM | POA: Insufficient documentation

## 2023-05-17 DIAGNOSIS — I252 Old myocardial infarction: Secondary | ICD-10-CM | POA: Insufficient documentation

## 2023-05-17 DIAGNOSIS — I5022 Chronic systolic (congestive) heart failure: Secondary | ICD-10-CM | POA: Diagnosis not present

## 2023-05-17 DIAGNOSIS — N185 Chronic kidney disease, stage 5: Secondary | ICD-10-CM

## 2023-05-17 DIAGNOSIS — I10 Essential (primary) hypertension: Secondary | ICD-10-CM | POA: Diagnosis not present

## 2023-05-17 DIAGNOSIS — I3139 Other pericardial effusion (noninflammatory): Secondary | ICD-10-CM | POA: Diagnosis not present

## 2023-05-17 DIAGNOSIS — I5021 Acute systolic (congestive) heart failure: Secondary | ICD-10-CM

## 2023-05-17 DIAGNOSIS — Z7985 Long-term (current) use of injectable non-insulin antidiabetic drugs: Secondary | ICD-10-CM | POA: Diagnosis not present

## 2023-05-17 DIAGNOSIS — E669 Obesity, unspecified: Secondary | ICD-10-CM | POA: Diagnosis not present

## 2023-05-17 DIAGNOSIS — I513 Intracardiac thrombosis, not elsewhere classified: Secondary | ICD-10-CM

## 2023-05-17 DIAGNOSIS — I130 Hypertensive heart and chronic kidney disease with heart failure and stage 1 through stage 4 chronic kidney disease, or unspecified chronic kidney disease: Secondary | ICD-10-CM | POA: Insufficient documentation

## 2023-05-17 DIAGNOSIS — N184 Chronic kidney disease, stage 4 (severe): Secondary | ICD-10-CM

## 2023-05-17 DIAGNOSIS — R0683 Snoring: Secondary | ICD-10-CM | POA: Diagnosis not present

## 2023-05-17 DIAGNOSIS — I120 Hypertensive chronic kidney disease with stage 5 chronic kidney disease or end stage renal disease: Secondary | ICD-10-CM

## 2023-05-17 LAB — ECHOCARDIOGRAM COMPLETE
AR max vel: 1.35 cm2
AV Area VTI: 1.31 cm2
AV Area mean vel: 1.3 cm2
AV Mean grad: 7 mm[Hg]
AV Peak grad: 12.2 mm[Hg]
Ao pk vel: 1.75 m/s
Area-P 1/2: 4.49 cm2
Calc EF: 41 %
MV M vel: 3.23 m/s
MV Peak grad: 41.7 mm[Hg]
MV VTI: 1.6 cm2
Radius: 0.3 cm
S' Lateral: 4.8 cm
Single Plane A2C EF: 40 %
Single Plane A4C EF: 41.8 %

## 2023-05-17 LAB — BASIC METABOLIC PANEL
Anion gap: 9 (ref 5–15)
BUN: 52 mg/dL — ABNORMAL HIGH (ref 6–20)
CO2: 22 mmol/L (ref 22–32)
Calcium: 8.8 mg/dL — ABNORMAL LOW (ref 8.9–10.3)
Chloride: 108 mmol/L (ref 98–111)
Creatinine, Ser: 5.71 mg/dL — ABNORMAL HIGH (ref 0.44–1.00)
GFR, Estimated: 8 mL/min — ABNORMAL LOW (ref >60)
Glucose, Bld: 85 mg/dL (ref 70–99)
Potassium: 4.6 mmol/L (ref 3.5–5.1)
Sodium: 139 mmol/L (ref 135–145)

## 2023-05-17 LAB — CBC
HCT: 31.7 % — ABNORMAL LOW (ref 36.0–46.0)
Hemoglobin: 10 g/dL — ABNORMAL LOW (ref 12.0–15.0)
MCH: 29.2 pg (ref 26.0–34.0)
MCHC: 31.5 g/dL (ref 30.0–36.0)
MCV: 92.7 fL (ref 80.0–100.0)
Platelets: 299 10*3/uL (ref 150–400)
RBC: 3.42 MIL/uL — ABNORMAL LOW (ref 3.87–5.11)
RDW: 12.6 % (ref 11.5–15.5)
WBC: 5.1 10*3/uL (ref 4.0–10.5)
nRBC: 0 % (ref 0.0–0.2)

## 2023-05-17 LAB — BRAIN NATRIURETIC PEPTIDE: B Natriuretic Peptide: 140.8 pg/mL — ABNORMAL HIGH (ref 0.0–100.0)

## 2023-05-17 MED ORDER — ISOSORB DINITRATE-HYDRALAZINE 20-37.5 MG PO TABS: 1.0000 | ORAL_TABLET | Freq: Three times a day (TID) | ORAL | 3 refills | Status: DC

## 2023-05-17 MED ORDER — APIXABAN 5 MG PO TABS: 5.0000 mg | ORAL_TABLET | Freq: Two times a day (BID) | ORAL | 5 refills | Status: DC

## 2023-05-17 MED ORDER — FUROSEMIDE 20 MG PO TABS: 40.0000 mg | ORAL_TABLET | Freq: Every day | ORAL | 2 refills | Status: DC

## 2023-05-17 MED ORDER — CARVEDILOL 12.5 MG PO TABS: 12.5000 mg | ORAL_TABLET | Freq: Two times a day (BID) | ORAL | 2 refills | Status: DC

## 2023-05-17 NOTE — Patient Instructions (Addendum)
Medication Changes:  STOP AMLODIPINE   STOP ASPIRIN   START: ELIQUIS 5MG  TWICE DAILY   INCREASE BIDIL TO 1 FULL TABLET THREE TIMES DAILY   INCREASE LASIX (FUROSEMIDE) TO 60MG  FOR 3 DAYS   THEN RETURN TO 40MG  DAILY THEREAFTER   Lab Work:  Labs done today, your results will be available in MyChart, we will contact you for abnormal readings.  Referrals:  YOU HAVE BEEN REFERRED TO NEPHROLOGY  THEY WILL REACH OUT TO YOU OR CALL TO ARRANGE THIS. PLEASE CALL us WITH ANY CONCERNS   MESSAGE SENT TO SLEEP DEPARTMENT REGARDING SLEEP STUDY   Follow-Up in: 4 WEEKS AS SCHEDULED   At the [facility], you and your health needs are our priority. We have a designated team specialized in the treatment of Heart Failure. This Care Team includes your primary Heart Failure Specialized Cardiologist (physician), Advanced Practice Providers (APPs- Physician Assistants and Nurse Practitioners), and Pharmacist who all work together to provide you with the care you need, when you need it.   You may see any of the following providers on your designated Care Team at your next follow up:  Dr. Arvilla Meres Dr. Marca Ancona Dr. Dorthula Nettles Dr. Theresia Bough Tonye Becket, NP Robbie Lis, [place] [facility] [place], [place] Brynda Peon, NP [place] Lee, NP Karle Plumber, PharmD   Please be sure to bring in all your medications bottles to every appointment.   Need to Contact us:  If you have any questions or concerns before your next appointment please send [place] a message through [place] or call our office at [phone].    TO LEAVE A MESSAGE FOR THE NURSE SELECT OPTION 2, PLEASE LEAVE A MESSAGE INCLUDING: YOUR NAME DATE OF BIRTH CALL BACK NUMBER REASON FOR CALL**this is important as we prioritize the call backs  YOU WILL RECEIVE A CALL BACK THE SAME DAY AS LONG AS YOU CALL BEFORE 4:00 PM

## 2023-05-17 NOTE — Progress Notes (Signed)
ReDS Vest / Clip - 05/17/23 1000       ReDS Vest / Clip   Station Marker B    Ruler Value 32    ReDS Value Range Low volume    ReDS Actual Value 37

## 2023-05-18 ENCOUNTER — Ambulatory Visit: Payer: 59 | Admitting: Podiatry

## 2023-05-21 ENCOUNTER — Encounter (HOSPITAL_COMMUNITY): Payer: Self-pay | Admitting: *Deleted

## 2023-05-21 NOTE — Progress Notes (Signed)
Pt's form for work accomodation completed with no lifting/pulling >30 lbs, rest breaks as needed, and signed by Dr Shirlee Latch  Pt aware and will pick up at front desk

## 2023-06-01 ENCOUNTER — Telehealth: Payer: Self-pay

## 2023-06-01 NOTE — Telephone Encounter (Signed)
-----   Message from Nurse Eileen Stanford B sent at 05/17/2023 10:40 AM EST ----- Regarding: SLEEP STUDY HI!   Can you check on process for sleep study? Split night previously ordered    Thank you  Eileen Stanford B

## 2023-06-01 NOTE — Telephone Encounter (Signed)
 PA for split-night study is pending review on [employer] portal, tracking [id number]

## 2023-06-13 ENCOUNTER — Ambulatory Visit (INDEPENDENT_AMBULATORY_CARE_PROVIDER_SITE_OTHER): Payer: 59 | Admitting: Podiatry

## 2023-06-13 DIAGNOSIS — Z91199 Patient's noncompliance with other medical treatment and regimen due to unspecified reason: Secondary | ICD-10-CM

## 2023-06-13 NOTE — Progress Notes (Signed)
NO show

## 2023-06-19 DIAGNOSIS — Z6834 Body mass index (BMI) 34.0-34.9, adult: Secondary | ICD-10-CM | POA: Diagnosis not present

## 2023-06-19 DIAGNOSIS — E119 Type 2 diabetes mellitus without complications: Secondary | ICD-10-CM | POA: Diagnosis not present

## 2023-06-19 DIAGNOSIS — I509 Heart failure, unspecified: Secondary | ICD-10-CM | POA: Diagnosis not present

## 2023-06-19 DIAGNOSIS — E785 Hyperlipidemia, unspecified: Secondary | ICD-10-CM | POA: Diagnosis not present

## 2023-06-19 DIAGNOSIS — Z833 Family history of diabetes mellitus: Secondary | ICD-10-CM | POA: Diagnosis not present

## 2023-06-19 DIAGNOSIS — M1990 Unspecified osteoarthritis, unspecified site: Secondary | ICD-10-CM | POA: Diagnosis not present

## 2023-06-19 DIAGNOSIS — Z7901 Long term (current) use of anticoagulants: Secondary | ICD-10-CM | POA: Diagnosis not present

## 2023-06-19 DIAGNOSIS — I429 Cardiomyopathy, unspecified: Secondary | ICD-10-CM | POA: Diagnosis not present

## 2023-06-19 DIAGNOSIS — I110 Hypertensive heart disease with heart failure: Secondary | ICD-10-CM | POA: Diagnosis not present

## 2023-06-19 DIAGNOSIS — E039 Hypothyroidism, unspecified: Secondary | ICD-10-CM | POA: Diagnosis not present

## 2023-06-19 DIAGNOSIS — Z7984 Long term (current) use of oral hypoglycemic drugs: Secondary | ICD-10-CM | POA: Diagnosis not present

## 2023-06-19 DIAGNOSIS — Z8249 Family history of ischemic heart disease and other diseases of the circulatory system: Secondary | ICD-10-CM | POA: Diagnosis not present

## 2023-06-22 ENCOUNTER — Ambulatory Visit (HOSPITAL_COMMUNITY)
Admission: RE | Admit: 2023-06-22 | Discharge: 2023-06-22 | Disposition: A | Discharge status: Home / Self Care | Payer: 59 | Source: Ambulatory Visit | Attending: Cardiology | Admitting: Cardiology

## 2023-06-22 ENCOUNTER — Telehealth (HOSPITAL_COMMUNITY): Payer: Self-pay | Admitting: *Deleted

## 2023-06-22 ENCOUNTER — Encounter (HOSPITAL_COMMUNITY): Payer: Self-pay | Admitting: Cardiology

## 2023-06-22 VITALS — BP 138/80 | HR 85 | Wt 183.0 lb

## 2023-06-22 DIAGNOSIS — I130 Hypertensive heart and chronic kidney disease with heart failure and stage 1 through stage 4 chronic kidney disease, or unspecified chronic kidney disease: Secondary | ICD-10-CM | POA: Insufficient documentation

## 2023-06-22 DIAGNOSIS — E669 Obesity, unspecified: Secondary | ICD-10-CM | POA: Insufficient documentation

## 2023-06-22 DIAGNOSIS — I5022 Chronic systolic (congestive) heart failure: Secondary | ICD-10-CM | POA: Insufficient documentation

## 2023-06-22 DIAGNOSIS — E1122 Type 2 diabetes mellitus with diabetic chronic kidney disease: Secondary | ICD-10-CM | POA: Insufficient documentation

## 2023-06-22 DIAGNOSIS — R0683 Snoring: Secondary | ICD-10-CM | POA: Diagnosis not present

## 2023-06-22 DIAGNOSIS — Z86718 Personal history of other venous thrombosis and embolism: Secondary | ICD-10-CM | POA: Insufficient documentation

## 2023-06-22 DIAGNOSIS — Z79899 Other long term (current) drug therapy: Secondary | ICD-10-CM | POA: Insufficient documentation

## 2023-06-22 DIAGNOSIS — N185 Chronic kidney disease, stage 5: Secondary | ICD-10-CM | POA: Diagnosis not present

## 2023-06-22 DIAGNOSIS — Z6834 Body mass index (BMI) 34.0-34.9, adult: Secondary | ICD-10-CM | POA: Diagnosis not present

## 2023-06-22 DIAGNOSIS — E785 Hyperlipidemia, unspecified: Secondary | ICD-10-CM | POA: Diagnosis not present

## 2023-06-22 DIAGNOSIS — Z7985 Long-term (current) use of injectable non-insulin antidiabetic drugs: Secondary | ICD-10-CM | POA: Diagnosis not present

## 2023-06-22 DIAGNOSIS — R519 Headache, unspecified: Secondary | ICD-10-CM | POA: Insufficient documentation

## 2023-06-22 DIAGNOSIS — I252 Old myocardial infarction: Secondary | ICD-10-CM | POA: Diagnosis not present

## 2023-06-22 DIAGNOSIS — Z7901 Long term (current) use of anticoagulants: Secondary | ICD-10-CM | POA: Insufficient documentation

## 2023-06-22 DIAGNOSIS — N184 Chronic kidney disease, stage 4 (severe): Secondary | ICD-10-CM | POA: Insufficient documentation

## 2023-06-22 DIAGNOSIS — I120 Hypertensive chronic kidney disease with stage 5 chronic kidney disease or end stage renal disease: Secondary | ICD-10-CM | POA: Diagnosis not present

## 2023-06-22 LAB — BASIC METABOLIC PANEL
Anion gap: 10 (ref 5–15)
BUN: 48 mg/dL — ABNORMAL HIGH (ref 6–20)
CO2: 19 mmol/L — ABNORMAL LOW (ref 22–32)
Calcium: 8.6 mg/dL — ABNORMAL LOW (ref 8.9–10.3)
Chloride: 110 mmol/L (ref 98–111)
Creatinine, Ser: 5.33 mg/dL — ABNORMAL HIGH (ref 0.44–1.00)
GFR, Estimated: 9 mL/min — ABNORMAL LOW (ref >60)
Glucose, Bld: 93 mg/dL (ref 70–99)
Potassium: 4.7 mmol/L (ref 3.5–5.1)
Sodium: 139 mmol/L (ref 135–145)

## 2023-06-22 LAB — FERRITIN: Ferritin: 57 ng/mL (ref 11–307)

## 2023-06-22 LAB — IRON AND TIBC
Iron: 87 ug/dL (ref 28–170)
Saturation Ratios: 30 % (ref 10.4–31.8)
TIBC: 290 ug/dL (ref 250–450)
UIBC: 203 ug/dL

## 2023-06-22 LAB — BRAIN NATRIURETIC PEPTIDE: B Natriuretic Peptide: 411.2 pg/mL — ABNORMAL HIGH (ref 0.0–100.0)

## 2023-06-22 MED ORDER — CARVEDILOL 12.5 MG PO TABS: 18.7500 mg | ORAL_TABLET | Freq: Two times a day (BID) | ORAL | 2 refills | Status: DC

## 2023-06-22 NOTE — Telephone Encounter (Signed)
Received referral from Dr. Shirlee Latch for this pt to participate in Cardiac rehab with the diagnosis of Chronic systolic heart failure.  Noted that pt was referred earlier to [facility] but had not heard from them.  Called and spoke to pt for preference of location.  Pt would like to come to [facility].  Pt works second shift at [employer].  Will continue clinical work up and pass to support staff for insurance verification and scheduling. Alanson Aly, BSN Cardiac and Emergency planning/management officer

## 2023-06-22 NOTE — Patient Instructions (Addendum)
Good to see you today!  INCREASE Coreg 10 18.75 mg(1.5 tablets) Twice daily  Labs done today, your results will be available in MyChart, we will contact you for abnormal readings.  Your physician has requested that you have a cardiac MRI. Cardiac MRI uses a computer to create images of your heart as its beating, producing both still and moving pictures of your heart and major blood vessels. For further information please visit [email or web address]. Please follow the instruction sheet given to you today for more information.  Sleep study re ordered they will call you  You have been referred to [facility] they will call to schedule  Referred to cardiac rehab  Please follow up with our heart failure pharmacist in as scheduled  Your physician recommends that you schedule a follow-up appointment in: as scheduled  If you have any questions or concerns before your next appointment please send [place] a message through [place] or call our office at [phone].    TO LEAVE A MESSAGE FOR THE NURSE SELECT OPTION 2, PLEASE LEAVE A MESSAGE INCLUDING: YOUR NAME DATE OF BIRTH CALL BACK NUMBER REASON FOR CALL**this is important as we prioritize the call backs  YOU WILL RECEIVE A CALL BACK THE SAME DAY AS LONG AS YOU CALL BEFORE 4:00 PM  At the [facility], you and your health needs are our priority. As part of our continuing mission to provide you with exceptional heart care, we have created designated Provider Care Teams. These Care Teams include your primary Cardiologist (physician) and Advanced Practice Providers (APPs- Physician Assistants and Nurse Practitioners) who all work together to provide you with the care you need, when you need it.   You may see any of the following providers on your designated Care Team at your next follow up: Dr Arvilla Meres Dr Marca Ancona Dr. Dorthula Nettles Dr. Clearnce Hasten Amy Filbert Schilder, NP Robbie Lis, [place] [facility] [place], [place] Brynda Peon, NP [place] Lee, NP Karle Plumber, PharmD   Please be sure to bring in all your medications bottles to every appointment.    Thank you for choosing [facility]

## 2023-06-23 LAB — LIPID PANEL
Cholesterol: 93 mg/dL (ref 0–200)
HDL: 37 mg/dL — ABNORMAL LOW (ref >40)
LDL Cholesterol: 41 mg/dL (ref 0–99)
Total CHOL/HDL Ratio: 2.5 {ratio}
Triglycerides: 76 mg/dL (ref <150)
VLDL: 15 mg/dL (ref 0–40)

## 2023-06-24 NOTE — Progress Notes (Signed)
ADVANCED HF CLINIC NOTE   Primary Care: Marcine Matar, MD Primary Cardiologist: Gypsy Balsam, MD HF Cardiologist: Dr. Shirlee Latch  Chief complaint: CHF  HPI: 53 y.o. 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    She's had history of noncompliance with medications and follow-up. Looking back through her chart, her blood pressure had been uncontrolled for a number of years.    Echo 1/21 showed EF 60-65%, grade I DD, RV normal.   Admitted 7/24 with heart failure, hypertensive urgency and AKI on CKD IV. BP 194/119. She was given IV lasix. Echo showed EF 20-25%, moderately dilated LV, grade II DD, RV normal. With no CP and mild hs-troponin elevation, did not undergo LHC. GDMT titrated and she was discharged home, weight 191 lbs.  Echo in 12/24 showed EF 30-35% with LV thrombus, diffuse hypokinesis, normal RV.   She returns today for followup of CHF.  She is now tolerating Bidil with minimal headache.  No chest pain.  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  Has lifting restrictions. Gets tired after walking up a flight of stairs.  No orthopnea/PND.  No lightheadedness.  No palpitations. No ETOH/drugs. Weight down 2 lbs.   ECG (personally reviewed): NSR, right axis deviation, inferolateral TWIs  Labs (12/24): BNP 124, K 4.6, creatinine 5.7, hgb 10  PMH: 1. HTN 2. Type 2 diabetes 3. Hyperlipidemia 4. Hypothyroidism 5. GERD  6. Chronic systolic CHF:  - Echo (5/17): EF 55-60%, G1DD, normal RV - Echo (7/19): EF 60-65%, G1DD, normal RV - Echo (1/21): EF 60-65%, G1DD, normal RV - Echo (7/24): EF 20-25%, moderately dilated LV, grade II DD, RV okay - Echo 12/24: EF 30-35%, LV with GHK, GIIDD, RV normal, small pericardial effusion, LV apical thrombus, mild MR 7. CKD stage IV 8. LV apical thrombus   Current Outpatient Medications   Medication Sig Dispense Refill   apixaban (ELIQUIS) 5 MG TABS tablet Take 1 tablet (5 mg total) by mouth 2 (two) times daily. 60 tablet 5   atorvastatin (LIPITOR) 10 MG tablet Take 1 tablet (10 mg total) by mouth daily. 30 tablet 2   Blood Glucose Monitoring Suppl (TRUE METRIX METER) w/Device KIT Use to check blood sugar 3 times daily. 1 kit 0   Blood Pressure Monitor DEVI Use to check blood pressure daily 1 each 0   furosemide (LASIX) 20 MG tablet Take 2 tablets (40 mg total) by mouth daily. 60 tablet 2   glipiZIDE (GLUCOTROL XL) 5 MG 24 hr tablet Take 1 tablet (5 mg total) by mouth daily with breakfast. 90 tablet 1   glucose blood (TRUE METRIX BLOOD GLUCOSE TEST) test strip Use to check blood sugar 3 times daily. 100 each 3   isosorbide-hydrALAZINE (BIDIL) 20-37.5 MG tablet Take 1 tablet by mouth 3 (three) times daily. 90 tablet 3   levothyroxine (SYNTHROID) 50 MCG tablet Take 1 tablet (50 mcg total) by mouth daily. 90 tablet 1   pantoprazole (PROTONIX) 40 MG tablet Take 1 tablet (40 mg total) by mouth daily. 90 tablet 1   Semaglutide,0.25 or 0.5MG /DOS, 2 MG/3ML SOPN Inject 0.5 mg into the skin once a week. 3 mL 3   TRUEplus Lancets 28G MISC Use to check blood sugar 3 times daily. 100 each 3   carvedilol (COREG) 12.5 MG tablet Take 1.5 tablets (18.75 mg  total) by mouth 2 (two) times daily with a meal. 60 tablet 2   No current facility-administered medications for this encounter.   Allergies  Allergen Reactions   Ibuprofen Swelling   Social History   Socioeconomic History   Marital status: Single    Spouse name: Not on file   Number of children: Not on file   Years of education: Not on file   Highest education level: Not on file  Occupational History   Not on file  Tobacco Use   Smoking status: Never   Smokeless tobacco: Never  Vaping Use   Vaping status: Never Used  Substance and Sexual Activity   Alcohol use: Yes    Alcohol/week: 0.0 standard drinks of alcohol    Comment:  occ   Drug use: No   Sexual activity: Not Currently    Birth control/protection: None  Other Topics Concern   Not on file  Social History Narrative   She has a 63 you daughter   81 yo son in jail until 01/2017   Dating   Works as Advertising copywriter   Social Drivers of Corporate investment banker Strain: Not on file  Food Insecurity: Low Risk  (05/02/2023)   Received from [facility]   Hunger Vital Sign    Worried About Running Out of Food in the Last Year: Never true    Ran Out of Food in the Last Year: Never true  Transportation Needs: No Transportation Needs (05/02/2023)   Received from [facility]    In the past 12 months, has lack of reliable transportation kept you from medical appointments, meetings, work or from getting things needed for daily living? : No  Physical Activity: Not on file  Stress: Not on file  Social Connections: Not on file  Intimate Partner Violence: Not At Risk (12/18/2022)   Humiliation, Afraid, Rape, and Kick questionnaire    Fear of Current or Ex-Partner: No    Emotionally Abused: No    Physically Abused: No    Sexually Abused: No   Family History  Problem Relation Age of Onset   Diabetes Mother    Diabetes Maternal Grandmother    Colon cancer Maternal Uncle 65   ROS: All systems reviewed and negative except as per HPI.   BP 138/80   Pulse 85   Wt 83 kg (183 lb)   LMP 10/08/2013   SpO2 98%   BMI 34.58 kg/m   Wt Readings from Last 3 Encounters:  06/22/23 83 kg (183 lb)  05/17/23 84.3 kg (185 lb 12.8 oz)  05/09/23 85 kg (187 lb 6.4 oz)   PHYSICAL EXAM: General: NAD Neck: No JVD, no thyromegaly or thyroid nodule.  Lungs: Clear to auscultation bilaterally with normal respiratory effort. CV: Nondisplaced PMI.  Heart regular S1/S2, no S3/S4, no murmur.  No peripheral edema.  No carotid bruit.  Normal pedal pulses.  Abdomen: Soft, nontender, no hepatosplenomegaly, no distention.  Skin: Intact without lesions or rashes.   Neurologic: Alert and oriented x 3.  Psych: Normal affect. Extremities: No clubbing or cyanosis.  HEENT: Normal.   ASSESSMENT & PLAN: 1. Chronic systolic CHF:  Echo in 7/24 with EF 20-25% with moderate LV dilation but normal wall thickness, normal RV, IVC not dilated.  Cause is uncertain, possibly due to long-standing HTN but walls are not thick. Poorly controlled DM could play a role.  She was said to have HF when her daughter was born but do not have echo  available from then and echoes in 2019 and 2021 showed normal EF.  No ETOH or drugs.  She did have a brother who had a cardiac arrest, but he had multiple medical problems. There is not clearly a familial predisposition. Echo 12/24 with EF 30-35%, global hypokinesis, GIIDD, RV normal, small pericardial effusion, LV apical thrombus, mild MR.  NYHA class II, not volume overloaded on exam.  - Continue Lasix 40 mg daily. BMET/BNP today.  - Increase Coreg to 18.75 mg bid.   - Continue Bidil 1 tab tid. She used to get a headache with this but seems to have mostly resolved.  Will need to titrate slowly.  - GFR too low to start SGLT2 inhibitor.  - I will arrange for cardiac MRI to assess for infiltrative disease/evidence for prior MI or myocarditis.  - Check ANA and Fe studies.  - Refer for cardiac rehab.   - Some improvement in EF on last echo.  Will await cardiac MRI and further med titration before deciding on ICD.  2. LV apical thrombus: Continue Eliquis.  3. HTN: BP is now controlled.  Hard to know how much HTN contributed to cardiomyopathy as she has been a long-time hypertensive and prior echoes have been normal.  Also, LV walls are not particularly thick.  4. Type 2 diabetes: She is on semaglutide.  5. CKD IV: Refer to nephrology.  6. Snoring: Waiting on sleep study.  Will see if we can expedite this.   Followup 3 wks with HF pharmacist for med titration, 6 wks with APP.   I spent 31 minutes reviewing records, interviewing/examining  patient, and managing orders.    Linda Phelps  06/24/23

## 2023-06-25 ENCOUNTER — Telehealth (HOSPITAL_COMMUNITY): Payer: Self-pay

## 2023-06-25 NOTE — Telephone Encounter (Signed)
Referral faxed to [facility]. Confirmation received.

## 2023-06-28 ENCOUNTER — Telehealth (HOSPITAL_COMMUNITY): Payer: Self-pay

## 2023-06-28 NOTE — Telephone Encounter (Signed)
 Pt insurance is active and benefits verified through [employer]. Co-pay $0.00, DED $7,500.00/$0.00 met, out of pocket $9,200.00/$28.16 met, co-insurance 50%. No pre-authorization required. John[employer], 06/28/23 @ 11:28AM, MZQ#[id number]   How many CR sessions are covered? TCR only 36 Is this a lifetime maximum or an annual maximum? Annual Has the member used any of these services to date? No Is there a time limit (weeks/months) on start of program and/or program completion? No   2ndary insurance is active and benefits verified through [facility].SABRA Co-pay $0.00, DED $0.00/$0.00 met, out of pocket $0.00/$0.00 met, co-insurance 0%. No pre-authorization required. Robert[employer], 06/28/23 @ 11:54AM, MZQ#[id number]

## 2023-06-28 NOTE — Telephone Encounter (Signed)
 Called patient to see if she was interested in participating in the Cardiac Rehab Program. Patient stated yes. Patient will come in for orientation on 07/03/23 @ 930AM and will attend the 10:15(M&F) exercise class.   Pensions consultant.

## 2023-07-02 ENCOUNTER — Telehealth (HOSPITAL_COMMUNITY): Payer: Self-pay

## 2023-07-02 NOTE — Telephone Encounter (Signed)
  Called pt to confirm appt for 07/02/23 at 0930. Gave pt instructions for appt, what to wear, office address, eating/taking meds before, and if sick to call and reschedule. Pt voiced understanding, all questions answered.   Health history completed? Yes   Arlander Labrum, MS, ACSM-CEP 07/02/2023 2:37 PM

## 2023-07-03 ENCOUNTER — Telehealth (HOSPITAL_COMMUNITY): Payer: Self-pay | Admitting: *Deleted

## 2023-07-03 ENCOUNTER — Inpatient Hospital Stay (HOSPITAL_COMMUNITY): Admission: RE | Admit: 2023-07-03 | Payer: 59 | Source: Ambulatory Visit

## 2023-07-03 NOTE — Telephone Encounter (Signed)
[facility] she has a flat tire and is waiting for [employer]. Kaely will not be able to attend orientation this morning. Will reschedule appointment. Nemiah is interested in attending orientation on next Monday.Thayer Headings RN BSN

## 2023-07-09 ENCOUNTER — Ambulatory Visit (HOSPITAL_COMMUNITY): Payer: 59

## 2023-07-11 ENCOUNTER — Other Ambulatory Visit (HOSPITAL_COMMUNITY): Payer: 59

## 2023-07-12 NOTE — Progress Notes (Incomplete)
***In Progress***    Advanced Heart Failure Clinic Note   Primary Care: Marcine Matar, MD Primary Cardiologist: Gypsy Balsam, MD HF Cardiologist: Dr. Shirlee Latch  HPI:  53 y.o. 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   She's had history of noncompliance with medications and follow-up. Looking back through her chart, her blood pressure has been uncontrolled for several years.    Echo 05/2019: EF 60-65%, grade I DD, RV okay   Admitted 11/2022 with a/c heart failure, hypertensive urgency and AKI on CKD. BP 194/119. She was given IV Lasix. Echo showed EF 20-25%, moderately dilated LV, grade II DD, RV okay. With no CP and mild HsTroponin elevated, did not undergo LHC. Plan for cMRI when renal function stabilizes (small risk of nephrogenic sclerosing fibrosis). GDMT titrated and she was discharged home, weight 191 lbs.  Echo in 04/2023 showed EF 30-35% with LV thrombus, diffuse hypokinesis, normal RV.    Presented to AHF Clinic for followup of CHF 06/22/23.  She was tolerating Bidil with minimal headaches.  No chest pain.  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  Had lifting restrictions. Reported that she gets tired after walking up a flight of stairs.  No orthopnea/PND.  No lightheadedness.  No palpitations. No ETOH/drugs. Weight was down 2 lbs.    Today he returns to HF clinic for pharmacist medication titration. At last visit with MD carvedilol was increased to 18.75 mg BID.   Shortness of breath/dyspnea on exertion? {YES NO:22349}  Orthopnea/PND? {YES NO:22349} Edema? {YES NO:22349} Lightheadedness/dizziness? {YES NO:22349} Daily weights at home? {YES NO:22349} Blood pressure/heart rate monitoring at home? {YES [id number] Following low-sodium/fluid-restricted diet? {YES NO:22349}  HF Medications: Carvedilol  18.75 mg BID Bidil 20-37.5 mg 1 tab TID Lasix 40 mg daily  Has the patient been experiencing any side effects to the medications prescribed?  {YES NO:22349}  Does the patient have any problems obtaining medications due to transportation or finances?   {YES NO:22349}  Understanding of regimen: {excellent/good/fair/poor:19665} Understanding of indications: {excellent/good/fair/poor:19665} Potential of compliance: {excellent/good/fair/poor:19665} Patient understands to avoid NSAIDs. Patient understands to avoid decongestants.    Pertinent Lab Values: Serum creatinine ***, BUN ***, Potassium ***, Sodium ***, BNP ***, Magnesium ***, Digoxin ***   Vital Signs: Weight: *** (last clinic weight: ***) Blood pressure: ***  Heart rate: ***   Assessment/Plan: 1. Chronic systolic CHF:  Echo in 11/2022 with EF 20-25% with moderate LV dilation but normal wall thickness, normal RV, IVC not dilated.  Cause is uncertain, possibly due to long-standing HTN but walls are not thick. Poorly controlled DM could play a role.  She was said to have HF when her daughter was born but do not have echo available from then and echoes in 2019 and 2021 showed normal EF.  No ETOH or drugs.  She did have a brother who had a cardiac arrest, but he had multiple medical problems. There is not clearly a familial predisposition. Echo 04/2023 with EF 30-35%, global hypokinesis, GIIDD, RV normal, small pericardial effusion, LV apical thrombus, mild MR.  -  NYHA class II, not volume overloaded on exam.  - Continue Lasix 40 mg daily.  - Continue carvedilol 18.75 mg BID.   - Continue Bidil 1 tab TID. She used to get a headache with this but seems to have mostly  resolved.  Will need to titrate slowly.  - GFR too low to start SGLT2 inhibitor.  - Referred for cardiac MRI to assess for infiltrative disease/evidence for prior MI or myocarditis.   - Previously referred for cardiac rehab.   - Some improvement in EF on last echo.  Will  await cardiac MRI and further med titration before deciding on ICD.  2. LV apical thrombus: Continue Eliquis.  3. HTN: BP is now controlled.  Hard to know how much HTN contributed to cardiomyopathy as she has been a long-time hypertensive and prior echoes have been normal.  Also, LV walls are not particularly thick.  4. Type 2 diabetes: She is on semaglutide.  5. CKD IV: Has been referred to nephrology.  6. Snoring: Waiting on sleep study.  Will see if we can expedite this.   Follow up ***   Karle Plumber, PharmD, BCPS, BCCP, CPP Heart Failure Clinic Pharmacist [phone]

## 2023-07-13 ENCOUNTER — Ambulatory Visit (HOSPITAL_COMMUNITY): Payer: 59

## 2023-07-16 ENCOUNTER — Ambulatory Visit (HOSPITAL_COMMUNITY): Payer: 59

## 2023-07-17 ENCOUNTER — Other Ambulatory Visit (HOSPITAL_COMMUNITY): Payer: 59

## 2023-07-20 ENCOUNTER — Ambulatory Visit (HOSPITAL_COMMUNITY): Payer: 59

## 2023-07-23 ENCOUNTER — Ambulatory Visit (HOSPITAL_COMMUNITY): Payer: 59

## 2023-07-27 ENCOUNTER — Ambulatory Visit (HOSPITAL_COMMUNITY): Payer: 59

## 2023-07-30 ENCOUNTER — Ambulatory Visit (HOSPITAL_COMMUNITY): Payer: 59

## 2023-08-02 ENCOUNTER — Telehealth (HOSPITAL_COMMUNITY): Payer: Self-pay

## 2023-08-02 NOTE — Telephone Encounter (Signed)
 Called to confirm/remind patient of their appointment at the Advanced Heart Failure Clinic on 08/03/23. However, was unable to leave patient a voice message because pt's mailbox was full.

## 2023-08-03 ENCOUNTER — Encounter (HOSPITAL_COMMUNITY): Payer: 59

## 2023-08-03 ENCOUNTER — Ambulatory Visit (HOSPITAL_COMMUNITY): Payer: 59

## 2023-08-06 ENCOUNTER — Ambulatory Visit (HOSPITAL_COMMUNITY): Payer: 59

## 2023-08-10 ENCOUNTER — Encounter (HOSPITAL_COMMUNITY): Payer: Self-pay

## 2023-08-10 ENCOUNTER — Ambulatory Visit (HOSPITAL_COMMUNITY): Payer: 59

## 2023-08-10 ENCOUNTER — Telehealth (HOSPITAL_COMMUNITY): Payer: Self-pay

## 2023-08-10 NOTE — Telephone Encounter (Signed)
 Attempted to call patient in regards to Cardiac Rehab - LM on VM Mailed letter

## 2023-08-13 ENCOUNTER — Ambulatory Visit (HOSPITAL_COMMUNITY): Payer: 59

## 2023-08-17 ENCOUNTER — Ambulatory Visit (HOSPITAL_COMMUNITY): Payer: 59

## 2023-08-20 ENCOUNTER — Ambulatory Visit (HOSPITAL_COMMUNITY): Payer: 59

## 2023-08-24 ENCOUNTER — Ambulatory Visit (HOSPITAL_COMMUNITY): Payer: 59

## 2023-08-27 ENCOUNTER — Telehealth (HOSPITAL_COMMUNITY): Payer: Self-pay

## 2023-08-27 ENCOUNTER — Ambulatory Visit (HOSPITAL_COMMUNITY): Payer: 59

## 2023-08-27 NOTE — Progress Notes (Addendum)
 ADVANCED HF CLINIC NOTE   Primary Care: Lawrance Presume, MD Primary Cardiologist: Ralene Burger, MD HF Cardiologist: Dr. Mitzie Anda  HPI: 53 y.o. 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    She's had history of noncompliance with medications and follow-up. Looking back through her chart, her blood pressure had been uncontrolled for a number of years.    Echo 1/21 showed EF 60-65%, grade I DD, RV normal.   Admitted 7/24 with heart failure, hypertensive urgency and AKI on CKD IV. BP 194/119. She was given IV lasix . Echo showed EF 20-25%, moderately dilated LV, grade II DD, RV normal. With no CP and mild hs-troponin elevation, did not undergo LHC. GDMT titrated and she was discharged home, weight 191 lbs.  Echo in 12/24 showed EF 30-35% with LV thrombus, diffuse hypokinesis, normal RV.   Today she returns for HF follow up with her daughter. Overall feeling fair. She has SOB mopping floors, carrying heavy trash bags, and vacuuming. 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 Feels occasional palpitations. Denies abnormal bleeding, CP, dizziness, edema, or PND/Orthopnea. Appetite ok. No fever or chills. Weight at home 173 pounds. Taking all medications. Has nephrology appt this month.  ECG (personally reviewed): none ordered today.  Labs (12/24): BNP 124, K 4.6, creatinine 5.7, hgb 10 Labs (1/25): K 4.7, creatinine 5.33, LDL 41, Fe ok  PMH: 1. HTN 2. Type 2 diabetes 3. Hyperlipidemia 4. Hypothyroidism 5. GERD  6. Chronic systolic CHF:  - Echo (5/17): EF 55-60%, G1DD, normal RV - Echo (7/19): EF 60-65%, G1DD, normal RV - Echo (1/21): EF 60-65%, G1DD, normal RV - Echo (7/24): EF 20-25%, moderately dilated LV, grade II DD, RV okay - Echo 12/24: EF 30-35%, LV with GHK, GIIDD, RV normal, small pericardial effusion, LV apical thrombus, mild MR 7.  CKD stage IV 8. LV apical thrombus  Current Outpatient Medications  Medication Sig Dispense Refill   apixaban  (ELIQUIS ) 5 MG TABS tablet Take 1 tablet (5 mg total) by mouth 2 (two) times daily. 60 tablet 5   atorvastatin  (LIPITOR) 10 MG tablet Take 1 tablet (10 mg total) by mouth daily. 30 tablet 2   Blood Glucose Monitoring Suppl (TRUE METRIX METER) w/Device KIT Use to check blood sugar 3 times daily. 1 kit 0   Blood Pressure Monitor DEVI Use to check blood pressure daily 1 each 0   carvedilol  (COREG ) 12.5 MG tablet Take 1.5 tablets (18.75 mg total) by mouth 2 (two) times daily with a meal. (Patient taking differently: Take 12.5 mg by mouth in the morning, at noon, and at bedtime.) 60 tablet 2   furosemide  (LASIX ) 20 MG tablet Take 2 tablets (40 mg total) by mouth daily. 60 tablet 2   glipiZIDE  (GLUCOTROL  XL) 5 MG 24 hr tablet Take 1 tablet (5 mg total) by mouth daily with breakfast. 90 tablet 1   glucose blood (TRUE METRIX BLOOD GLUCOSE TEST) test strip Use to check blood sugar 3 times daily. 100 each 3   isosorbide -hydrALAZINE  (BIDIL ) 20-37.5 MG tablet Take 1 tablet by mouth 3 (three) times daily. 90 tablet 3   levothyroxine  (SYNTHROID ) 50 MCG tablet Take 1 tablet (50 mcg total) by mouth daily. 90 tablet 1   pantoprazole  (PROTONIX ) 40 MG tablet Take 1 tablet (40 mg total) by mouth daily. 90 tablet 1   Semaglutide ,0.25 or 0.5MG /DOS,  2 MG/3ML SOPN Inject 0.5 mg into the skin once a week. (Patient taking differently: Inject 0.5 mg into the skin once a week. Takes on Thurdays) 3 mL 3   TRUEplus Lancets 28G MISC Use to check blood sugar 3 times daily. 100 each 3   No current facility-administered medications for this encounter.   Allergies  Allergen Reactions   Ibuprofen Swelling   Social History   Socioeconomic History   Marital status: Single    Spouse name: Not on file   Number of children: Not on file   Years of education: Not on file   Highest education level: Not on file   Occupational History   Not on file  Tobacco Use   Smoking status: Never   Smokeless tobacco: Never  Vaping Use   Vaping status: Never Used  Substance and Sexual Activity   Alcohol use: Yes    Alcohol/week: 0.0 standard drinks of alcohol    Comment: occ   Drug use: No   Sexual activity: Not Currently    Birth control/protection: None  Other Topics Concern   Not on file  Social History Narrative   She has a 67 you daughter   37 yo son in jail until 01/2017   Dating   Works as Advertising copywriter   Social Drivers of Corporate investment banker Strain: Not on file  Food Insecurity: Low Risk  (05/02/2023)   Received from [facility]   Hunger Vital Sign    Worried About Running Out of Food in the Last Year: Never true    Ran Out of Food in the Last Year: Never true  Transportation Needs: No Transportation Needs (05/02/2023)   Received from [facility]    In the past 12 months, has lack of reliable transportation kept you from medical appointments, meetings, work or from getting things needed for daily living? : No  Physical Activity: Not on file  Stress: Not on file  Social Connections: Not on file  Intimate Partner Violence: Not At Risk (12/18/2022)   Humiliation, Afraid, Rape, and Kick questionnaire    Fear of Current or Ex-Partner: No    Emotionally Abused: No    Physically Abused: No    Sexually Abused: No   Family History  Problem Relation Age of Onset   Diabetes Mother    Diabetes Maternal Grandmother    Colon cancer Maternal Uncle 65   ROS: All systems reviewed and negative except as per HPI.   BP (!) 148/80   Pulse 74   Wt 80.4 kg (177 lb 3.2 oz)   LMP 10/08/2013   SpO2 98%   BMI 33.48 kg/m   Wt Readings from Last 3 Encounters:  08/28/23 80.4 kg (177 lb 3.2 oz)  06/22/23 83 kg (183 lb)  05/17/23 84.3 kg (185 lb 12.8 oz)   PHYSICAL EXAM: General:  NAD. No resp difficulty, walked into clinic HEENT: Normal Neck: Supple. No JVD. Cor:  Regular rate & rhythm. No rubs, gallops or murmurs. Lungs: Clear Abdomen: Soft, obese, nontender, nondistended.  Extremities: No cyanosis, clubbing, rash, edema Neuro: Alert & oriented x 3, moves all 4 extremities w/o difficulty. Affect pleasant.  ASSESSMENT & PLAN: 1. Chronic systolic CHF:  Echo in 7/24 with EF 20-25% with moderate LV dilation but normal wall thickness, normal RV, IVC not dilated.  Cause is uncertain, possibly due to long-standing HTN but walls are not thick. Poorly controlled DM could play a role.  She was said to  have HF when her daughter was born but do not have echo available from then and echoes in 2019 and 2021 showed normal EF.  No ETOH or drugs.  She did have a brother who had a cardiac arrest, but he had multiple medical problems. There is not clearly a familial predisposition. Echo 12/24 with EF 30-35%, global hypokinesis, GIIDD, RV normal, small pericardial effusion, LV apical thrombus, mild MR.  NYHA class IIb, not volume overloaded on exam.  - Continue Lasix  40 mg bid. BMET/BNP today.  - Decrease Coreg  to 12.5 mg bid (unable to tolerate 18.75 dose) - Increase Bidil  to 1.5 tab tid. She used to get a headache with this but seems to have mostly resolved.  Will need to titrate slowly.  - GFR too low to start SGLT2 inhibitor.  - We have arranged for cardiac MRI to assess for infiltrative disease/evidence for prior MI or myocarditis.  - She has been referred to cardiac rehab.  Will notify she is agreeable. - Some improvement in EF on last echo.  Will await cardiac MRI and further med titration before deciding on ICD.  2. LV apical thrombus: Continue Eliquis . No bleeding issues. 3. HTN: BP is now controlled.  Hard to know how much HTN contributed to cardiomyopathy as she has been a long-time hypertensive and prior echoes have been normal.  Also, LV walls are not particularly thick.  - Given Rx for BP cuff. 4. Type 2 diabetes: She is on semaglutide .  5. CKD IV: She has been  referred to nephrology, has appt with [facility] this month. 6. Snoring: Waiting on sleep study.  Will see if we can expedite this.   Follow up in 3 months with Dr. Mitzie Anda.  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   Arlice Bene [facility] FNP-BC 08/28/23

## 2023-08-27 NOTE — Telephone Encounter (Signed)
 Called to confirm/remind patient of their appointment at the Advanced Heart Failure Clinic on 08/28/23***.   Appointment:   [x] Confirmed  [] Left mess   [] No answer/No voice mail  [] Phone not in service  Patient reminded to bring all medications and/or complete list.  Confirmed patient has transportation. Gave directions, instructed to utilize valet parking.

## 2023-08-28 ENCOUNTER — Encounter (HOSPITAL_COMMUNITY): Payer: Self-pay

## 2023-08-28 ENCOUNTER — Ambulatory Visit (HOSPITAL_COMMUNITY)
Admission: RE | Admit: 2023-08-28 | Discharge: 2023-08-28 | Disposition: A | Discharge status: Home / Self Care | Source: Ambulatory Visit | Attending: Family Medicine | Admitting: Family Medicine

## 2023-08-28 VITALS — BP 148/80 | HR 74 | Wt 177.2 lb

## 2023-08-28 DIAGNOSIS — Z7984 Long term (current) use of oral hypoglycemic drugs: Secondary | ICD-10-CM | POA: Insufficient documentation

## 2023-08-28 DIAGNOSIS — Z7901 Long term (current) use of anticoagulants: Secondary | ICD-10-CM | POA: Diagnosis not present

## 2023-08-28 DIAGNOSIS — I5022 Chronic systolic (congestive) heart failure: Secondary | ICD-10-CM

## 2023-08-28 DIAGNOSIS — R0683 Snoring: Secondary | ICD-10-CM

## 2023-08-28 DIAGNOSIS — I513 Intracardiac thrombosis, not elsewhere classified: Secondary | ICD-10-CM

## 2023-08-28 DIAGNOSIS — N184 Chronic kidney disease, stage 4 (severe): Secondary | ICD-10-CM

## 2023-08-28 DIAGNOSIS — I130 Hypertensive heart and chronic kidney disease with heart failure and stage 1 through stage 4 chronic kidney disease, or unspecified chronic kidney disease: Secondary | ICD-10-CM | POA: Diagnosis not present

## 2023-08-28 DIAGNOSIS — E1122 Type 2 diabetes mellitus with diabetic chronic kidney disease: Secondary | ICD-10-CM | POA: Insufficient documentation

## 2023-08-28 DIAGNOSIS — O903 Peripartum cardiomyopathy: Secondary | ICD-10-CM | POA: Diagnosis present

## 2023-08-28 DIAGNOSIS — N185 Chronic kidney disease, stage 5: Secondary | ICD-10-CM

## 2023-08-28 DIAGNOSIS — I120 Hypertensive chronic kidney disease with stage 5 chronic kidney disease or end stage renal disease: Secondary | ICD-10-CM

## 2023-08-28 LAB — BASIC METABOLIC PANEL WITH GFR
Anion gap: 11 (ref 5–15)
BUN: 57 mg/dL — ABNORMAL HIGH (ref 6–20)
CO2: 22 mmol/L (ref 22–32)
Calcium: 9 mg/dL (ref 8.9–10.3)
Chloride: 109 mmol/L (ref 98–111)
Creatinine, Ser: 5.14 mg/dL — ABNORMAL HIGH (ref 0.44–1.00)
GFR, Estimated: 9 mL/min — ABNORMAL LOW (ref >60)
Glucose, Bld: 91 mg/dL (ref 70–99)
Potassium: 4.5 mmol/L (ref 3.5–5.1)
Sodium: 142 mmol/L (ref 135–145)

## 2023-08-28 LAB — BRAIN NATRIURETIC PEPTIDE: B Natriuretic Peptide: 304.6 pg/mL — ABNORMAL HIGH (ref 0.0–100.0)

## 2023-08-28 MED ORDER — CARVEDILOL 12.5 MG PO TABS: 12.5000 mg | ORAL_TABLET | Freq: Two times a day (BID) | ORAL | 5 refills | Status: DC

## 2023-08-28 MED ORDER — BLOOD PRESSURE CUFF MISC: 1.0000 [IU] | Freq: Every day | 0 refills | Status: DC

## 2023-08-28 MED ORDER — ISOSORB DINITRATE-HYDRALAZINE 20-37.5 MG PO TABS: 1.5000 | ORAL_TABLET | Freq: Three times a day (TID) | ORAL | 5 refills | Status: DC

## 2023-08-28 NOTE — Patient Instructions (Addendum)
 Medication Changes:  DECREASE CARVEDILOL 12.5MG  TWICE DAILY   INCREASE BIDIL TO 1.5 TABLETS THREE TIMES DAY   ORDER PROVIDED FOR BLOOD PRESSURE CUFF   Lab Work:  Labs done today, your results will be available in MyChart, we will contact you for abnormal readings.  Referrals:  MESSAGE SENT TO CARDIAC REHAB- THEY WILL CALL YOU TO GET THIS ARRANGED   SCHEDULING WILL CALL WITH SLEEP STUDY APPOINTMENT DATE   Follow-Up in: 3 MONTHS WITH DR. Shirlee Latch PLEASE CALL OUR OFFICE AROUND JUNE TO GET SCHEDULED FOR YOUR APPOINTMENT. PHONE NUMBER IS [phone] OPTION 2   At the [facility], you and your health needs are our priority. We have a designated team specialized in the treatment of Heart Failure. This Care Team includes your primary Heart Failure Specialized Cardiologist (physician), Advanced Practice Providers (APPs- Physician Assistants and Nurse Practitioners), and Pharmacist who all work together to provide you with the care you need, when you need it.   You may see any of the following providers on your designated Care Team at your next follow up:  Dr. Arvilla Meres Dr. Marca Ancona Dr. Dorthula Nettles Dr. Theresia Bough Tonye Becket, NP Robbie Lis, [place] [facility] [place], [place] Brynda Peon, NP [place] Lee, NP Karle Plumber, PharmD   Please be sure to bring in all your medications bottles to every appointment.   Need to Contact us:  If you have any questions or concerns before your next appointment please send [place] a message through [place] or call our office at [phone].    TO LEAVE A MESSAGE FOR THE NURSE SELECT OPTION 2, PLEASE LEAVE A MESSAGE INCLUDING: YOUR NAME DATE OF BIRTH CALL BACK NUMBER REASON FOR CALL**this is important as we prioritize the call backs  YOU WILL RECEIVE A CALL BACK THE SAME DAY AS LONG AS YOU CALL BEFORE 4:00 PM

## 2023-08-31 ENCOUNTER — Ambulatory Visit (HOSPITAL_COMMUNITY): Payer: 59

## 2023-08-31 ENCOUNTER — Telehealth (HOSPITAL_COMMUNITY): Payer: Self-pay

## 2023-08-31 NOTE — Telephone Encounter (Signed)
 Called patient to see if she was interested in participating in the Cardiac Rehab Program. Patient will come in for orientation on 09/24/23 @ 8AM and will attend the 8:15AM exercise class.   Pensions consultant.

## 2023-09-03 ENCOUNTER — Ambulatory Visit (HOSPITAL_COMMUNITY): Payer: 59

## 2023-09-06 ENCOUNTER — Other Ambulatory Visit (HOSPITAL_COMMUNITY): Payer: Self-pay

## 2023-09-06 ENCOUNTER — Telehealth (HOSPITAL_COMMUNITY): Payer: Self-pay

## 2023-09-06 ENCOUNTER — Other Ambulatory Visit (HOSPITAL_COMMUNITY): Payer: Self-pay | Admitting: Cardiology

## 2023-09-06 MED ORDER — BIDIL 20-37.5 MG PO TABS
1.5000 | ORAL_TABLET | Freq: Three times a day (TID) | ORAL | 6 refills | Status: DC
Start: 2023-09-06 — End: 2023-09-07

## 2023-09-06 NOTE — Telephone Encounter (Signed)
 Contacted pharmacy again, they are still unable to receive a paid claim for Bidil, and they do not have the medication in stock. I have offered to have the prescription filled with a [facility] pharmacy in order to obtain a paid claim. Test billing still returns $4 copay.  Pt is agreeable to this, I have spoken with [facility] (not in stock) and [facility], who does have brand name available. Requesting new rx be sent to [facility] pharmacy to be filled, will inform patient once rx is received / available for pickup.

## 2023-09-06 NOTE — Addendum Note (Signed)
 Addended by: Edris Gowers on: 09/06/2023 02:02 PM   Modules accepted: Orders

## 2023-09-06 NOTE — Telephone Encounter (Signed)
 Advanced Heart Failure Patient Advocate Encounter  Patient contacted office stating that Bidil requires prior auth. Review of this plan shows that brand name is preferred. Contacted pharmacy to reprocess, technician stated that they are unable to fill for brand when the script was sent as generic. Requesting new rx (DAW) Bidil be sent to pharmacy. Informed patient by phone  Kennis Peacock, CPhT Rx Patient Advocate Phone: [phone]

## 2023-09-07 ENCOUNTER — Other Ambulatory Visit (HOSPITAL_COMMUNITY): Payer: Self-pay

## 2023-09-07 ENCOUNTER — Ambulatory Visit: Payer: 59 | Admitting: Internal Medicine

## 2023-09-07 ENCOUNTER — Ambulatory Visit (HOSPITAL_COMMUNITY): Payer: 59

## 2023-09-07 ENCOUNTER — Other Ambulatory Visit: Payer: Self-pay

## 2023-09-07 MED ORDER — ISOSORB DINITRATE-HYDRALAZINE 20-37.5 MG PO TABS
1.5000 | ORAL_TABLET | Freq: Three times a day (TID) | ORAL | 6 refills | Status: DC
  Filled 2023-09-07 (×2): qty 135, 30d supply, fill #0
  Filled 2023-10-23: qty 135, 30d supply, fill #1

## 2023-09-07 NOTE — Telephone Encounter (Signed)
 [facility] has confirmed rx in stock and in process for $4. Patient informed by phone.

## 2023-09-07 NOTE — Addendum Note (Signed)
 Addended by: Edris Gowers on: 09/07/2023 09:52 AM   Modules accepted: Orders

## 2023-09-10 ENCOUNTER — Ambulatory Visit (HOSPITAL_COMMUNITY): Payer: 59

## 2023-09-10 ENCOUNTER — Ambulatory Visit (HOSPITAL_COMMUNITY)

## 2023-09-14 ENCOUNTER — Ambulatory Visit (HOSPITAL_COMMUNITY): Payer: 59

## 2023-09-17 ENCOUNTER — Ambulatory Visit (HOSPITAL_COMMUNITY): Payer: 59

## 2023-09-18 ENCOUNTER — Telehealth (HOSPITAL_COMMUNITY): Payer: Self-pay | Admitting: *Deleted

## 2023-09-18 ENCOUNTER — Telehealth (HOSPITAL_COMMUNITY): Payer: Self-pay

## 2023-09-18 ENCOUNTER — Inpatient Hospital Stay (HOSPITAL_COMMUNITY): Admission: RE | Admit: 2023-09-18 | Source: Ambulatory Visit

## 2023-09-18 NOTE — Telephone Encounter (Signed)
 Called and confirmed CR orientation appt with pt on 09/24/23 @8AM .  Patient verbalized understanding.

## 2023-09-18 NOTE — Telephone Encounter (Signed)
 Attempted to call regarding orientation appointment today. Unable to leave a message.Monte Antonio RN BSN

## 2023-09-18 NOTE — Telephone Encounter (Signed)
 Tried calling patient in regards to her FMLA paperwork. Unable to leave message.  If she calls back please inform her that Dr. Mitzie Anda not in the office this week and will get him to sign it next week when he back and will fax it then

## 2023-09-20 ENCOUNTER — Telehealth (HOSPITAL_COMMUNITY): Payer: Self-pay

## 2023-09-20 NOTE — Telephone Encounter (Signed)
  Called pt to confirm appt for 09/24/23 at 0800. Gave pt instructions for appt, what to wear, office address, eating/taking meds before, and if sick to call and reschedule. Pt voiced understanding, all questions answered.   Health history completed? Yes, revised previous health history from 07/02/23 with updated info  Arlander Labrum, MS, ACSM-CEP 09/20/2023 3:49 PM

## 2023-09-21 ENCOUNTER — Ambulatory Visit (HOSPITAL_COMMUNITY): Payer: 59

## 2023-09-21 ENCOUNTER — Ambulatory Visit (HOSPITAL_COMMUNITY)

## 2023-09-24 ENCOUNTER — Ambulatory Visit (HOSPITAL_COMMUNITY): Payer: 59

## 2023-09-24 ENCOUNTER — Ambulatory Visit (HOSPITAL_COMMUNITY)

## 2023-09-24 ENCOUNTER — Telehealth (HOSPITAL_COMMUNITY): Payer: Self-pay

## 2023-09-24 ENCOUNTER — Inpatient Hospital Stay (HOSPITAL_COMMUNITY): Admission: RE | Admit: 2023-09-24 | Source: Ambulatory Visit

## 2023-09-24 NOTE — Telephone Encounter (Signed)
Pt aware.

## 2023-09-24 NOTE — Telephone Encounter (Signed)
 Called pt regarding appt for orientation this morning, no answer. Left message.   Arlander Labrum, MS, ACSM-CEP 09/24/2023 8:11 AM

## 2023-09-28 ENCOUNTER — Ambulatory Visit (HOSPITAL_COMMUNITY): Payer: 59

## 2023-09-28 ENCOUNTER — Ambulatory Visit (HOSPITAL_COMMUNITY)

## 2023-09-29 ENCOUNTER — Other Ambulatory Visit: Payer: Self-pay | Admitting: Physician Assistant

## 2023-09-29 ENCOUNTER — Telehealth: Payer: Self-pay | Admitting: Cardiology

## 2023-09-29 DIAGNOSIS — Z7985 Long-term (current) use of injectable non-insulin antidiabetic drugs: Secondary | ICD-10-CM

## 2023-09-29 DIAGNOSIS — E1165 Type 2 diabetes mellitus with hyperglycemia: Secondary | ICD-10-CM

## 2023-09-29 DIAGNOSIS — I10 Essential (primary) hypertension: Secondary | ICD-10-CM

## 2023-09-29 MED ORDER — FUROSEMIDE 20 MG PO TABS: 40.0000 mg | ORAL_TABLET | Freq: Two times a day (BID) | ORAL | 2 refills | Status: DC

## 2023-09-29 NOTE — Telephone Encounter (Signed)
 Patient called the answering service this afternoon because she has run out of her Lasix  40 mg tablets and needs a refill.  Reports that she has been taking Lasix  40 mg twice daily.  Per review of chart, her prescription is currently written for 40 mg daily.  When patient was last seen by advanced heart failure clinic on 4/8, note mentions that she was on Lasix  40 mg daily at that time and she should continue this dose.  Per patient, she had been on Lasix  40 mg twice daily at that time and has continued to take it twice daily since.  Labs 4/8 showed stable kidney function with creatinine 5.14, potassium 4.5.  She reports that she has been doing very well on Lasix  40 mg twice daily and does not have any issues with swelling or fluid retention.  Refilled Lasix  40 mg twice daily.  I will forward this note to Vernia Good NP who saw her on 4/8 for dose clarification.  Debria Fang, PA-C 09/29/2023 3:53 PM

## 2023-10-01 ENCOUNTER — Ambulatory Visit (HOSPITAL_COMMUNITY)

## 2023-10-01 ENCOUNTER — Other Ambulatory Visit (HOSPITAL_COMMUNITY): Payer: Self-pay | Admitting: Family Medicine

## 2023-10-01 ENCOUNTER — Ambulatory Visit (HOSPITAL_COMMUNITY): Payer: 59

## 2023-10-01 NOTE — Addendum Note (Signed)
 Encounter addended by: Elmarie Hacking, FNP on: 10/01/2023 8:13 AM  Actions taken: Order Reconciliation Section accessed, Clinical Note Signed

## 2023-10-02 ENCOUNTER — Telehealth (HOSPITAL_COMMUNITY): Payer: Self-pay | Admitting: Cardiology

## 2023-10-02 NOTE — Telephone Encounter (Signed)
 Linda Phelps with the [employer] at [facility] in [place], [place] called to see if someone can fax over Linda Phelps paperwork for [employer].  Alvina Jordan would like for someone to call her back in regards to Linda Phelps at [phone].  Thank you.

## 2023-10-02 NOTE — Telephone Encounter (Signed)
 Requested Prescriptions  Pending Prescriptions Disp Refills   Semaglutide ,0.25 or 0.5MG /DOS, (OZEMPIC , 0.25 OR 0.5 MG/DOSE,) 2 MG/3ML SOPN [Pharmacy Med Name: Ozempic  (0.25 or 0.5 MG/DOSE) 2 MG/3ML Subcutaneous Solution Pen-injector] 3 mL 0    Sig: Inject 0.5 mg into the skin once a week. Takes on Thurdays     Endocrinology:  Diabetes - GLP-1 Receptor Agonists - semaglutide  Failed - 10/02/2023 10:57 AM      Failed - Cr in normal range and within 360 days    Creat  Date Value Ref Range Status  07/11/2016 1.21 (H) 0.50 - 1.10 mg/dL Final   Creatinine, Ser  Date Value Ref Range Status  08/28/2023 5.14 (H) 0.44 - 1.00 mg/dL Final   Creatinine, POC  Date Value Ref Range Status  01/08/2017 100 mg/dL Final   Creatinine, Urine  Date Value Ref Range Status  04/13/2015 34 20 - 320 mg/dL Final         Passed - HBA1C in normal range and within 180 days    HbA1c, POC (controlled diabetic range)  Date Value Ref Range Status  05/09/2023 5.4 0.0 - 7.0 % Final         Passed - Valid encounter within last 6 months    Recent Outpatient Visits           4 months ago Type 2 diabetes mellitus with hyperglycemia, without long-term current use of insulin  (HCC)   [facility] [place]. [facility], Shelvy Dickens M, [place]   9 months ago Type 2 diabetes mellitus with hyperglycemia, without long-term current use of insulin  [facility])   [facility] [facility] - A Dept Of [place]. [facility] Joaquin Mulberry, MD   2 years ago Essential hypertension   [facility] [facility] - A Dept Of [place]. [facility] Collins Dean, NP   2 years ago Primary hypertension   [facility] [facility] [place] - A Dept Of [place]. [facility] Collins Dean, NP   3 years ago Pure hypercholesterolemia    [facility] [place] - A Dept Of [place]. [facility] [place], [place], PA-C

## 2023-10-03 ENCOUNTER — Telehealth (HOSPITAL_COMMUNITY): Payer: Self-pay

## 2023-10-03 ENCOUNTER — Other Ambulatory Visit (HOSPITAL_COMMUNITY): Payer: Self-pay

## 2023-10-03 DIAGNOSIS — I10 Essential (primary) hypertension: Secondary | ICD-10-CM

## 2023-10-03 DIAGNOSIS — I5022 Chronic systolic (congestive) heart failure: Secondary | ICD-10-CM

## 2023-10-03 NOTE — Telephone Encounter (Signed)
 error

## 2023-10-05 ENCOUNTER — Ambulatory Visit (HOSPITAL_COMMUNITY)

## 2023-10-08 ENCOUNTER — Ambulatory Visit (HOSPITAL_COMMUNITY)

## 2023-10-08 NOTE — Telephone Encounter (Signed)
 Pt aware 5/16 Forms in the front office

## 2023-10-09 ENCOUNTER — Telehealth (HOSPITAL_COMMUNITY): Payer: Self-pay | Admitting: Family Medicine

## 2023-10-09 NOTE — Telephone Encounter (Signed)
 Patient states that she needs clinical staff to call her in regards to getting her daughter a letter for school for accommodations when her daughter was out of school with her for her appts / her care.   Patient would like for you to give her a call on her telephone number [phone].   Patient states that the school / college is trying to take away her daughters financial aid since she was out of school a lot for her moms appointments.   Please advise.   Thank you.

## 2023-10-10 ENCOUNTER — Telehealth (HOSPITAL_COMMUNITY): Payer: Self-pay

## 2023-10-10 NOTE — Telephone Encounter (Signed)
 FMLA paperwork E-mailed to the [employer]. Left message for patient to inform her forms have been sent

## 2023-10-12 ENCOUNTER — Ambulatory Visit (HOSPITAL_COMMUNITY)

## 2023-10-16 NOTE — Telephone Encounter (Signed)
 Mother reports a letter is needed explain her condition  (CHF) and daughter is her care taker, daughter has missed classes since she is primary care taker.  Including hospitalization  July 2024 Prn flares July 2024-present   Date range to cover July 2024-now

## 2023-10-18 NOTE — Telephone Encounter (Signed)
 Letter complete patient aware

## 2023-10-19 ENCOUNTER — Ambulatory Visit (HOSPITAL_COMMUNITY)

## 2023-10-22 ENCOUNTER — Ambulatory Visit (HOSPITAL_COMMUNITY)

## 2023-10-23 ENCOUNTER — Other Ambulatory Visit: Payer: Self-pay

## 2023-10-26 ENCOUNTER — Ambulatory Visit (HOSPITAL_COMMUNITY)

## 2023-10-29 ENCOUNTER — Ambulatory Visit (HOSPITAL_COMMUNITY)

## 2023-10-30 ENCOUNTER — Other Ambulatory Visit: Payer: Self-pay

## 2023-10-31 ENCOUNTER — Other Ambulatory Visit (HOSPITAL_COMMUNITY)

## 2023-11-01 ENCOUNTER — Ambulatory Visit (HOSPITAL_BASED_OUTPATIENT_CLINIC_OR_DEPARTMENT_OTHER): Admitting: Cardiology

## 2023-11-02 ENCOUNTER — Ambulatory Visit (HOSPITAL_COMMUNITY)

## 2023-11-05 ENCOUNTER — Ambulatory Visit (HOSPITAL_COMMUNITY)

## 2023-11-09 ENCOUNTER — Ambulatory Visit (HOSPITAL_COMMUNITY)

## 2023-11-12 ENCOUNTER — Ambulatory Visit (HOSPITAL_COMMUNITY)

## 2023-11-12 ENCOUNTER — Ambulatory Visit: Payer: Self-pay | Admitting: *Deleted

## 2023-11-12 NOTE — Telephone Encounter (Signed)
 FYI Only or Action Required?: FYI only for provider.  Patient was last seen in primary care on 05/09/2023 by Danton Jon HERO, PA-C. Called Nurse Triage reporting Thrush. Symptoms began several days ago. Interventions attempted: OTC medications: Tylenol . Symptoms are: gradually worsening.  Triage Disposition: See HCP Within 4 Hours (Or PCP Triage)  Patient/caregiver understands and will follow disposition?: No open appointment open for today- offered options- patient request appointment with PCP  Reason for Disposition  [1] SEVERE mouth pain (e.g., excruciating) AND [2] not improved after 2 hours of pain medicine  Answer Assessment - Initial Assessment Questions 1. ONSET: When did the mouth start hurting? (e.g., hours or days ago)      Took antibiotic- thrush on tongue, swelling 2. SEVERITY: How bad is the pain? (Scale 1-10; mild, moderate or severe)   - MILD (1-3):  doesn't interfere with eating or normal activities   - MODERATE (4-7): interferes with eating some solids and normal activities   - SEVERE (8-10):  excruciating pain, interferes with most normal activities   - SEVERE DYSPHAGIA: can't swallow liquids, drooling     moderate 3. SORES: Are there any sores or ulcers in the mouth? If Yes, ask: What part of the mouth are the sores in?     Yes- left side of tongue 4. FEVER: Do you have a fever? If Yes, ask: What is your temperature, how was it measured, and when did it start?     no 5. CAUSE: What do you think is causing the mouth pain?     Thrush- from antibiotic 6. OTHER SYMPTOMS: Do you have any other symptoms? (e.g., difficulty breathing)     no  Protocols used: Mouth Pain-A-AH    Copied from CRM [id number]. Topic: Clinical - Red Word Triage >> Nov 12, 2023  9:18 AM Powell HERO wrote: Red Word that prompted transfer to Nurse Triage: Patient states she is on antibiotics and thinks she has thrush, having pain, not able to eat in 3 days.

## 2023-11-12 NOTE — Telephone Encounter (Signed)
 noted

## 2023-11-13 ENCOUNTER — Ambulatory Visit: Admitting: Internal Medicine

## 2023-11-16 ENCOUNTER — Ambulatory Visit (HOSPITAL_COMMUNITY)

## 2023-11-19 ENCOUNTER — Ambulatory Visit (HOSPITAL_COMMUNITY)

## 2023-11-19 ENCOUNTER — Telehealth: Payer: Self-pay | Admitting: Internal Medicine

## 2023-11-19 DIAGNOSIS — E1165 Type 2 diabetes mellitus with hyperglycemia: Secondary | ICD-10-CM

## 2023-11-19 DIAGNOSIS — Z7985 Long-term (current) use of injectable non-insulin antidiabetic drugs: Secondary | ICD-10-CM

## 2023-11-19 NOTE — Telephone Encounter (Unsigned)
 Copied from CRM [id number]. Topic: Clinical - Medication Refill >> Nov 19, 2023  1:26 PM Charlet HERO wrote: Medication: Semaglutide ,0.25 or 0.5MG /DOS, (OZEMPIC , 0.25 OR 0.5 MG/DOSE,) 2 MG/3ML SOPN  Has the patient contacted their pharmacy? No No refills  This is the patient's preferred pharmacy:  [id number] - [place], [place] - [address] [place] [address] Phone: [phone] Fax: [phone]   Is this the correct pharmacy for this prescription? Yes If no, delete pharmacy and type the correct one.   Has the prescription been filled recently? Yes  Is the patient out of the medication? Yes  Has the patient been seen for an appointment in the last year OR does the patient have an upcoming appointment? Yes  Can we respond through MyChart? No  Agent: Please be advised that Rx refills may take up to 3 business days. We ask that you follow-up with your pharmacy.

## 2023-11-20 ENCOUNTER — Other Ambulatory Visit (HOSPITAL_COMMUNITY)

## 2023-11-21 MED ORDER — OZEMPIC (0.25 OR 0.5 MG/DOSE) 2 MG/3ML ~~LOC~~ SOPN: 0.5000 mg | PEN_INJECTOR | SUBCUTANEOUS | 0 refills | Status: DC

## 2023-11-21 NOTE — Telephone Encounter (Signed)
 Appt 11/27/23- courtesy refill 3ml given Requested Prescriptions  Pending Prescriptions Disp Refills   Semaglutide ,0.25 or 0.5MG /DOS, (OZEMPIC , 0.25 OR 0.5 MG/DOSE,) 2 MG/3ML SOPN 3 mL 0    Sig: Inject 0.5 mg into the skin once a week. Takes on Thurdays     Endocrinology:  Diabetes - GLP-1 Receptor Agonists - semaglutide  Failed - 11/21/2023 11:28 AM      Failed - HBA1C in normal range and within 180 days    HbA1c, POC (controlled diabetic range)  Date Value Ref Range Status  05/09/2023 5.4 0.0 - 7.0 % Final         Failed - Cr in normal range and within 360 days    Creat  Date Value Ref Range Status  07/11/2016 1.21 (H) 0.50 - 1.10 mg/dL Final   Creatinine, Ser  Date Value Ref Range Status  08/28/2023 5.14 (H) 0.44 - 1.00 mg/dL Final   Creatinine, POC  Date Value Ref Range Status  01/08/2017 100 mg/dL Final   Creatinine, Urine  Date Value Ref Range Status  04/13/2015 34 20 - 320 mg/dL Final         Failed - Valid encounter within last 6 months    Recent Outpatient Visits           6 months ago Type 2 diabetes mellitus with hyperglycemia, without long-term current use of insulin  (HCC)   [facility] [place]. [facility], Jon M, [place]   10 months ago Type 2 diabetes mellitus with hyperglycemia, without long-term current use of insulin  [facility])   [facility] [facility] - A Dept Of [place]. [facility] Delbert Clam, MD   2 years ago Essential hypertension   [facility] - A Dept Of [place]. [facility] Theotis Haze ORN, NP   2 years ago Primary hypertension   [place] [facility] [place] - A Dept Of [place]. [facility] Theotis Haze ORN, NP   3 years ago Pure hypercholesterolemia   [facility] [facility] [place] - A Dept Of . [facility] [place], [place], PA-C

## 2023-11-26 ENCOUNTER — Ambulatory Visit (HOSPITAL_COMMUNITY)

## 2023-11-26 ENCOUNTER — Telehealth: Payer: Self-pay | Admitting: Internal Medicine

## 2023-11-26 NOTE — Telephone Encounter (Signed)
 Contacted pt left vm to confirmed appt

## 2023-11-27 ENCOUNTER — Encounter: Payer: Self-pay | Admitting: Nurse Practitioner

## 2023-11-27 ENCOUNTER — Ambulatory Visit: Attending: Nurse Practitioner | Admitting: Nurse Practitioner

## 2023-11-27 VITALS — BP 121/73 | HR 72 | Resp 19 | Ht 61.0 in | Wt 176.6 lb

## 2023-11-27 DIAGNOSIS — N185 Chronic kidney disease, stage 5: Secondary | ICD-10-CM | POA: Diagnosis not present

## 2023-11-27 DIAGNOSIS — Z1211 Encounter for screening for malignant neoplasm of colon: Secondary | ICD-10-CM

## 2023-11-27 DIAGNOSIS — K219 Gastro-esophageal reflux disease without esophagitis: Secondary | ICD-10-CM

## 2023-11-27 DIAGNOSIS — D649 Anemia, unspecified: Secondary | ICD-10-CM

## 2023-11-27 DIAGNOSIS — E1122 Type 2 diabetes mellitus with diabetic chronic kidney disease: Secondary | ICD-10-CM | POA: Diagnosis not present

## 2023-11-27 DIAGNOSIS — I120 Hypertensive chronic kidney disease with stage 5 chronic kidney disease or end stage renal disease: Secondary | ICD-10-CM | POA: Diagnosis not present

## 2023-11-27 DIAGNOSIS — Z1231 Encounter for screening mammogram for malignant neoplasm of breast: Secondary | ICD-10-CM | POA: Diagnosis not present

## 2023-11-27 DIAGNOSIS — Z7985 Long-term (current) use of injectable non-insulin antidiabetic drugs: Secondary | ICD-10-CM

## 2023-11-27 DIAGNOSIS — E119 Type 2 diabetes mellitus without complications: Secondary | ICD-10-CM | POA: Diagnosis not present

## 2023-11-27 DIAGNOSIS — E7800 Pure hypercholesterolemia, unspecified: Secondary | ICD-10-CM

## 2023-11-27 MED ORDER — SEMAGLUTIDE (1 MG/DOSE) 4 MG/3ML ~~LOC~~ SOPN: 1.0000 mg | PEN_INJECTOR | SUBCUTANEOUS | 1 refills | Status: DC

## 2023-11-27 MED ORDER — PANTOPRAZOLE SODIUM 40 MG PO TBEC: 40.0000 mg | DELAYED_RELEASE_TABLET | Freq: Every day | ORAL | 1 refills | Status: AC

## 2023-11-27 NOTE — Patient Instructions (Signed)
 [facility] Located in: [facility] [address] Address: [address], [place], [place] [address] Phone: [phone]    [facility] Located in: [facility] Address: [address], [place], [place] [address] Phone: [phone]

## 2023-11-27 NOTE — Progress Notes (Signed)
 Assessment & Plan:  Linda Phelps was seen today for medical management of chronic issues.  Diagnoses and all orders for this visit:  Diabetes mellitus treated with injections of non-insulin  medication (HCC) She is aware of possible side effects with increasing dose: constipation, N/V, abdomina pain -     Hemoglobin A1c -     Semaglutide , 1 MG/DOSE, 4 MG/3ML SOPN; Inject 1 mg as directed once a week. Continue blood sugar control as discussed in office today, low carbohydrate diet, and regular physical exercise as tolerated, 150 minutes per week (30 min each day, 5 days per week, or 50 min 3 days per week). Keep blood sugar logs with fasting goal of 90-130 mg/dl, post prandial (after you eat) less than 180.  For Hypoglycemia: BS <60 and Hyperglycemia BS >400; contact the clinic ASAP. Annual eye exams and foot exams are recommended.   Hypercholesterolemia -     Lipid panel INSTRUCTIONS: Work on a low fat, heart healthy diet and participate in regular aerobic exercise program by working out at least 150 minutes per week; 5 days a week-30 minutes per day. Avoid red meat/beef/steak,  fried foods. junk foods, sodas, sugary drinks, unhealthy snacking, alcohol and smoking.  Drink at least 80 oz of water per day and monitor your carbohydrate intake daily.    Hypertension associated with stage 5 chronic kidney disease due to type 2 diabetes mellitus (HCC) -     Urine Albumin/Creatinine with ratio (send out) [LAB689] -     CMP14+EGFR Continue all antihypertensives as prescribed.  Reminded to bring in blood pressure log for follow  up appointment.  RECOMMENDATIONS: DASH/Mediterranean Diets are healthier choices for HTN.    Encounter for screening mammogram for malignant neoplasm of breast -     MS 3D SCR MAMMO BILAT BR (aka MM); Future  Colon cancer screening -     Ambulatory referral to Gastroenterology  Gastroesophageal reflux disease without esophagitis -     pantoprazole  (PROTONIX ) 40 MG tablet;  Take 1 tablet (40 mg total) by mouth daily. INSTRUCTIONS: Avoid GERD Triggers: acidic, spicy or fried foods, caffeine, coffee, sodas,  alcohol and chocolate.     Anemia, unspecified type -     CBC with Differential    Patient has been counseled on age-appropriate routine health concerns for screening and prevention. These are reviewed and up-to-date. Referrals have been placed accordingly. Immunizations are up-to-date or declined.    Subjective:   Chief Complaint  Patient presents with   Medical Management of Chronic Issues    Linda Phelps 53 y.o. female presents to office today for follow-up to hypertension.  She is a patient of Dr. Vicci.  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   Patient has been counseled on age-appropriate routine health concerns for screening and prevention. These are reviewed and up-to-date. Referrals have been placed accordingly. Immunizations are up-to-date or declined.     Mammogram: Overdue.  Referral placed and patient was given the office number to the breast clinic to schedule Colonoscopy: Overdue.  Referral placed and patient was given the office number to gastroenterology. Pap smear: Patient  reports as of today through the health department.  States last Pap smear was normal   HTN Blood pressure is well-controlled today with carvedilol  12.5 mg twice  daily, BiDil  20-37.5 mg and furosemide  40 mg BID BP Readings from Last 3 Encounters:  11/27/23 121/73  08/28/23 (!) 148/80  06/22/23 138/80    DM A1c goal.  She is requesting to increase her Ozempic  today due to increased cravings and minimal weight loss.  She was hesitant to start 1 mg Ozempic  after we discussed potential side effects.  However she will start today and monitor her symptoms.  If unable to tolerate due to GI side effects will decrease Ozempic  back to 0.5 mg weekly.  She is also taking glipizide  5 mg daily. Lab Results  Component Value Date   HGBA1C 5.4 05/09/2023  LDL at goal with low intensity statin Lab Results  Component Value Date   LDLCALC 41 06/22/2023     Review of Systems  Constitutional:  Negative for fever, malaise/fatigue and weight loss.  HENT: Negative.  Negative for nosebleeds.   Eyes: Negative.  Negative for blurred vision, double vision and photophobia.  Respiratory: Negative.  Negative for cough and shortness of breath.   Cardiovascular: Negative.  Negative for chest pain, palpitations and leg swelling.  Gastrointestinal: Negative.  Negative for heartburn, nausea and vomiting.  Musculoskeletal: Negative.  Negative for myalgias.  Neurological: Negative.  Negative for dizziness, focal weakness, seizures and headaches.  Psychiatric/Behavioral: Negative.  Negative for suicidal ideas.     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   CHF (congestive heart failure) (HCC)    Chronic right-sided low back pain without sciatica 07/11/2016   Chronic systolic heart failure (HCC) 09/21/2015   Diabetes mellitus type 2 in obese 07/03/2014   Diabetes mellitus without complication (HCC)    Ectopic pregnancy    Essential hypertension 07/03/2014   Generalized anxiety disorder 07/26/2015   GERD (gastroesophageal reflux disease) 07/26/2015   High cholesterol    History of  noncompliance with medical treatment 09/21/2015   Overview:  Per [facility]  Formatting of this note might be different from the original. Per cardiologist notes from [facility]   Hordeolum externum (stye) 01/11/2016   Hyperlipidemia 07/03/2014   Hypertension    Hypothyroidism 07/03/2014   Long-term use of aspirin  therapy 09/21/2015   Low back pain    Menstrual cycle problem 01/11/2016   Obesity    Obesity (BMI 30.0-34.9) 10/01/2015   Palpitations 07/26/2015   Poor dentition 04/13/2015   Thyroid  disease    Vitamin D  insufficiency 01/13/2016    Past Surgical History:  Procedure Laterality Date   APPENDECTOMY     CHOLECYSTECTOMY      Family History  Problem Relation Age of Onset   Diabetes Mother    Diabetes Maternal Grandmother    Colon cancer Maternal Uncle 37    Social History Reviewed with no changes to be made today.   Outpatient Medications Prior to Visit  Medication Sig Dispense Refill   apixaban  (ELIQUIS ) 5 MG TABS tablet Take 1 tablet (5 mg total) by mouth 2 (two) times daily. 60 tablet 5   atorvastatin  (LIPITOR) 10 MG tablet Take 1 tablet (10 mg total) by mouth daily. 30 tablet 2   carvedilol  (COREG ) 12.5 MG tablet Take 1 tablet (12.5 mg total) by mouth 2 (two) times daily with a meal. 60  tablet 5   furosemide  (LASIX ) 20 MG tablet Take 2 tablets (40 mg total) by mouth 2 (two) times daily. 60 tablet 2   glipiZIDE  (GLUCOTROL  XL) 5 MG 24 hr tablet Take 1 tablet (5 mg total) by mouth daily with breakfast. 90 tablet 1   glucose blood (TRUE METRIX BLOOD GLUCOSE TEST) test strip Use to check blood sugar 3 times daily. 100 each 3   isosorbide -hydrALAZINE  (BIDIL ) 20-37.5 MG tablet Take 1.5 tablets by mouth 3 (three) times daily. 135 tablet 6   levothyroxine  (SYNTHROID ) 50 MCG tablet Take 1 tablet (50 mcg total) by mouth daily. 90 tablet 1   Semaglutide ,0.25 or 0.5MG /DOS, (OZEMPIC , 0.25 OR 0.5 MG/DOSE,) 2 MG/3ML SOPN Inject 0.5 mg into the skin  once a week. Takes on Thurdays 3 mL 0   TRUEplus Lancets 28G MISC Use to check blood sugar 3 times daily. 100 each 3   pantoprazole  (PROTONIX ) 40 MG tablet Take 1 tablet (40 mg total) by mouth daily. 90 tablet 1   Blood Glucose Monitoring Suppl (TRUE METRIX METER) w/Device KIT Use to check blood sugar 3 times daily. (Patient not taking: Reported on 11/27/2023) 1 kit 0   Blood Pressure Monitor DEVI Use to check blood pressure daily (Patient not taking: Reported on 11/27/2023) 1 each 0   Blood Pressure Monitoring (BLOOD PRESSURE CUFF) MISC 1 Units by Does not apply route daily. (Patient not taking: Reported on 11/27/2023) 1 each 0   No facility-administered medications prior to visit.    Allergies  Allergen Reactions   Ibuprofen Swelling       Objective:    BP 121/73 (BP Location: Left Arm, Patient Position: Sitting, Cuff Size: Normal)   Pulse 72   Resp 19   Ht 5' 1 (1.549 m)   Wt 176 lb 9.6 oz (80.1 kg)   LMP 10/08/2013   SpO2 100%   BMI 33.37 kg/m  Wt Readings from Last 3 Encounters:  11/27/23 176 lb 9.6 oz (80.1 kg)  08/28/23 177 lb 3.2 oz (80.4 kg)  06/22/23 183 lb (83 kg)    Physical Exam Vitals and nursing note reviewed.  Constitutional:      Appearance: She is well-developed.  HENT:     Head: Normocephalic and atraumatic.  Cardiovascular:     Rate and Rhythm: Normal rate and regular rhythm.     Heart sounds: Normal heart sounds. No murmur heard.    No friction rub. No gallop.  Pulmonary:     Effort: Pulmonary effort is normal. No tachypnea or respiratory distress.     Breath sounds: Normal breath sounds. No decreased breath sounds, wheezing, rhonchi or rales.  Chest:     Chest wall: No tenderness.  Abdominal:     General: Bowel sounds are normal.     Palpations: Abdomen is soft.  Musculoskeletal:        General: Normal range of motion.     Cervical back: Normal range of motion.  Skin:    General: Skin is warm and dry.  Neurological:     Mental Status: She is  alert and oriented to person, place, and time.     Coordination: Coordination normal.  Psychiatric:        Behavior: Behavior normal. Behavior is cooperative.        Thought Content: Thought content normal.        Judgment: Judgment normal.          Patient has been counseled extensively about nutrition and exercise as well  as the importance of adherence with medications and regular follow-up. The patient was given clear instructions to go to ER or return to medical center if symptoms don't improve, worsen or new problems develop. The patient verbalized understanding.   Follow-up: Return in about 3 months (around 02/29/2024) for f/u Dr. Vicci.   Haze LELON Servant, FNP-BC [facility] and [facility] [place], [place] [phone]   11/27/2023, 10:28 AM

## 2023-11-28 ENCOUNTER — Other Ambulatory Visit: Payer: Self-pay

## 2023-11-28 ENCOUNTER — Ambulatory Visit: Payer: Self-pay | Admitting: Nurse Practitioner

## 2023-11-28 LAB — CBC WITH DIFFERENTIAL/PLATELET
Basophils Absolute: 0 x10E3/uL (ref 0.0–0.2)
Basos: 1 %
EOS (ABSOLUTE): 0.1 x10E3/uL (ref 0.0–0.4)
Eos: 3 %
Hematocrit: 32.4 % — ABNORMAL LOW (ref 34.0–46.6)
Hemoglobin: 10.3 g/dL — ABNORMAL LOW (ref 11.1–15.9)
Immature Grans (Abs): 0 x10E3/uL (ref 0.0–0.1)
Immature Granulocytes: 0 %
Lymphocytes Absolute: 2 x10E3/uL (ref 0.7–3.1)
Lymphs: 39 %
MCH: 30.5 pg (ref 26.6–33.0)
MCHC: 31.8 g/dL (ref 31.5–35.7)
MCV: 96 fL (ref 79–97)
Monocytes Absolute: 0.5 x10E3/uL (ref 0.1–0.9)
Monocytes: 10 %
Neutrophils Absolute: 2.4 x10E3/uL (ref 1.4–7.0)
Neutrophils: 47 %
Platelets: 264 x10E3/uL (ref 150–450)
RBC: 3.38 x10E6/uL — ABNORMAL LOW (ref 3.77–5.28)
RDW: 13.5 % (ref 11.7–15.4)
WBC: 5.1 x10E3/uL (ref 3.4–10.8)

## 2023-11-29 ENCOUNTER — Telehealth (INDEPENDENT_AMBULATORY_CARE_PROVIDER_SITE_OTHER): Payer: Self-pay

## 2023-11-29 LAB — CMP14+EGFR
ALT: 11 IU/L (ref 0–32)
AST: 16 IU/L (ref 0–40)
Albumin: 4.1 g/dL (ref 3.8–4.9)
Alkaline Phosphatase: 80 IU/L (ref 44–121)
BUN/Creatinine Ratio: 10 (ref 9–23)
BUN: 52 mg/dL — ABNORMAL HIGH (ref 6–24)
Bilirubin Total: 0.5 mg/dL (ref 0.0–1.2)
CO2: 17 mmol/L — ABNORMAL LOW (ref 20–29)
Calcium: 9.1 mg/dL (ref 8.7–10.2)
Chloride: 108 mmol/L — ABNORMAL HIGH (ref 96–106)
Creatinine, Ser: 5.06 mg/dL — ABNORMAL HIGH (ref 0.57–1.00)
Globulin, Total: 2.8 g/dL (ref 1.5–4.5)
Glucose: 77 mg/dL (ref 70–99)
Potassium: 5.1 mmol/L (ref 3.5–5.2)
Sodium: 140 mmol/L (ref 134–144)
Total Protein: 6.9 g/dL (ref 6.0–8.5)
eGFR: 10 mL/min/1.73 — ABNORMAL LOW (ref >59)

## 2023-11-29 LAB — LIPID PANEL
Chol/HDL Ratio: 2.8 ratio (ref 0.0–4.4)
Cholesterol, Total: 102 mg/dL (ref 100–199)
HDL: 36 mg/dL — ABNORMAL LOW (ref >39)
LDL Chol Calc (NIH): 48 mg/dL (ref 0–99)
Triglycerides: 89 mg/dL (ref 0–149)
VLDL Cholesterol Cal: 18 mg/dL (ref 5–40)

## 2023-11-29 LAB — MICROALBUMIN / CREATININE URINE RATIO
Creatinine, Urine: 70.2 mg/dL
Microalb/Creat Ratio: 382 mg/g{creat} — AB (ref 0–29)
Microalbumin, Urine: 268.5 ug/mL

## 2023-11-29 LAB — HEMOGLOBIN A1C
Est. average glucose Bld gHb Est-mCnc: 103 mg/dL
Hgb A1c MFr Bld: 5.2 % (ref 4.8–5.6)

## 2023-11-29 NOTE — Telephone Encounter (Signed)
 Contacted pt to schedule mammogram  Pt is schedule for 12/28/23 at 8:40am

## 2023-11-30 ENCOUNTER — Ambulatory Visit (HOSPITAL_COMMUNITY)

## 2023-12-03 ENCOUNTER — Ambulatory Visit (HOSPITAL_COMMUNITY)

## 2023-12-03 ENCOUNTER — Ambulatory Visit: Payer: Self-pay

## 2023-12-03 DIAGNOSIS — E1165 Type 2 diabetes mellitus with hyperglycemia: Secondary | ICD-10-CM

## 2023-12-03 DIAGNOSIS — E039 Hypothyroidism, unspecified: Secondary | ICD-10-CM

## 2023-12-03 DIAGNOSIS — Z7984 Long term (current) use of oral hypoglycemic drugs: Secondary | ICD-10-CM

## 2023-12-03 DIAGNOSIS — E7800 Pure hypercholesterolemia, unspecified: Secondary | ICD-10-CM

## 2023-12-03 NOTE — Telephone Encounter (Signed)
 I have not seen this patient since 2021.  Recently saw Linda Phelps last week.  Please refer to her lab result note and look in referral section to see where GI referral was submitted to. If she wants to discuss the Ozempic , please give her an appt with me.

## 2023-12-03 NOTE — Telephone Encounter (Signed)
 FYI Only or Action Required?: Action required by provider: clinical question for provider.  Patient was last seen in primary care on 11/27/2023 by Theotis Haze ORN, NP.  Called Nurse Triage reporting lab results.  Triage Disposition: Call PCP Within 24 Hours  Patient/caregiver understands and will follow disposition?: Yes  Copied from CRM [id number]. Topic: Clinical - Lab/Test Results >> Dec 03, 2023  2:19 PM Powell HERO wrote: Reason for CRM: go over lab results from 7/8 Reason for Disposition  [1] Follow-up call from patient regarding patient's clinical status AND [2] information NON-URGENT  Answer Assessment - Initial Assessment Questions 1. REASON FOR CALL or QUESTION: What is your reason for calling today? or How can I best     Patient calling to go over lab results from 7/8. Patient states that when the office called on 7/10 she was half asleep. Review provider note with patient. Patient is asking for follow up from provider with her A1C and if she needs to remain on ozempic . Patient is also asking if there is any update on GI referral for a colonoscopy. Patient verbalized understanding and all questions answered.  2. CALLER: Document the source of call. (e.g., laboratory staff, caregiver or patient).     patient  Protocols used: PCP Call - No Triage-A-AH

## 2023-12-06 MED ORDER — ATORVASTATIN CALCIUM 10 MG PO TABS: 10.0000 mg | ORAL_TABLET | Freq: Every day | ORAL | 2 refills | Status: DC

## 2023-12-06 MED ORDER — LEVOTHYROXINE SODIUM 50 MCG PO TABS
50.0000 ug | ORAL_TABLET | Freq: Every day | ORAL | 1 refills | Status: DC
Start: 2023-12-06 — End: 2024-03-05

## 2023-12-06 MED ORDER — GLIPIZIDE ER 5 MG PO TB24: 5.0000 mg | ORAL_TABLET | Freq: Every day | ORAL | 1 refills | Status: DC

## 2023-12-06 MED ORDER — LEVOTHYROXINE SODIUM 50 MCG PO TABS: 50.0000 ug | ORAL_TABLET | Freq: Every day | ORAL | 1 refills | Status: DC

## 2023-12-06 NOTE — Addendum Note (Signed)
 Addended by: DELORES RUBY F on: 12/06/2023 11:33 AM   Modules accepted: Orders

## 2023-12-06 NOTE — Addendum Note (Signed)
 Addended by: DELORES RUBY F on: 12/06/2023 11:30 AM   Modules accepted: Orders

## 2023-12-06 NOTE — Telephone Encounter (Signed)
 Patient voiced that she was made aware to continue with Ozempic  as ordered. Request refills on atorvastatin  (LIPITOR) 10 MG tablet, glipiZIDE  (GLUCOTROL  XL) 5 MG 24 hr tablet and levothyroxine  (SYNTHROID ) 50 MCG tablet  refills sent to [id number] - [place],  - [address] as requested.

## 2023-12-07 ENCOUNTER — Encounter (HOSPITAL_COMMUNITY): Payer: Self-pay | Admitting: Cardiology

## 2023-12-07 ENCOUNTER — Ambulatory Visit (HOSPITAL_COMMUNITY)
Admission: RE | Admit: 2023-12-07 | Discharge: 2023-12-07 | Disposition: A | Discharge status: Home / Self Care | Source: Ambulatory Visit | Attending: Cardiology | Admitting: Cardiology

## 2023-12-07 ENCOUNTER — Ambulatory Visit (HOSPITAL_COMMUNITY)

## 2023-12-07 ENCOUNTER — Other Ambulatory Visit: Payer: Self-pay

## 2023-12-07 VITALS — BP 142/78 | HR 77 | Ht 61.0 in | Wt 179.2 lb

## 2023-12-07 DIAGNOSIS — E669 Obesity, unspecified: Secondary | ICD-10-CM | POA: Insufficient documentation

## 2023-12-07 DIAGNOSIS — I5022 Chronic systolic (congestive) heart failure: Secondary | ICD-10-CM | POA: Insufficient documentation

## 2023-12-07 DIAGNOSIS — Z91148 Patient's other noncompliance with medication regimen for other reason: Secondary | ICD-10-CM | POA: Diagnosis not present

## 2023-12-07 DIAGNOSIS — Z7901 Long term (current) use of anticoagulants: Secondary | ICD-10-CM | POA: Diagnosis not present

## 2023-12-07 DIAGNOSIS — Z79899 Other long term (current) drug therapy: Secondary | ICD-10-CM | POA: Diagnosis not present

## 2023-12-07 DIAGNOSIS — Z7984 Long term (current) use of oral hypoglycemic drugs: Secondary | ICD-10-CM | POA: Insufficient documentation

## 2023-12-07 DIAGNOSIS — E1165 Type 2 diabetes mellitus with hyperglycemia: Secondary | ICD-10-CM | POA: Diagnosis not present

## 2023-12-07 DIAGNOSIS — I130 Hypertensive heart and chronic kidney disease with heart failure and stage 1 through stage 4 chronic kidney disease, or unspecified chronic kidney disease: Secondary | ICD-10-CM | POA: Insufficient documentation

## 2023-12-07 DIAGNOSIS — E1122 Type 2 diabetes mellitus with diabetic chronic kidney disease: Secondary | ICD-10-CM | POA: Insufficient documentation

## 2023-12-07 DIAGNOSIS — Z86718 Personal history of other venous thrombosis and embolism: Secondary | ICD-10-CM | POA: Diagnosis not present

## 2023-12-07 DIAGNOSIS — Z7985 Long-term (current) use of injectable non-insulin antidiabetic drugs: Secondary | ICD-10-CM | POA: Diagnosis not present

## 2023-12-07 DIAGNOSIS — I3139 Other pericardial effusion (noninflammatory): Secondary | ICD-10-CM | POA: Diagnosis not present

## 2023-12-07 DIAGNOSIS — E785 Hyperlipidemia, unspecified: Secondary | ICD-10-CM | POA: Insufficient documentation

## 2023-12-07 DIAGNOSIS — N184 Chronic kidney disease, stage 4 (severe): Secondary | ICD-10-CM | POA: Insufficient documentation

## 2023-12-07 DIAGNOSIS — I10 Essential (primary) hypertension: Secondary | ICD-10-CM

## 2023-12-07 MED ORDER — ISOSORB DINITRATE-HYDRALAZINE 20-37.5 MG PO TABS
2.0000 | ORAL_TABLET | Freq: Three times a day (TID) | ORAL | 6 refills | Status: DC
  Filled 2023-12-07: qty 180, 30d supply, fill #0
  Filled 2024-02-07: qty 180, 30d supply, fill #1
  Filled 2024-03-21 (×2): qty 180, 30d supply, fill #2
  Filled 2024-05-08: qty 180, 30d supply, fill #3
  Filled 2024-05-27: qty 60, 10d supply, fill #4
  Filled 2024-06-06: qty 60, 10d supply, fill #5

## 2023-12-07 MED ORDER — FUROSEMIDE 20 MG PO TABS: 40.0000 mg | ORAL_TABLET | Freq: Two times a day (BID) | ORAL | 2 refills | Status: DC

## 2023-12-07 MED ORDER — ISOSORB DINITRATE-HYDRALAZINE 20-37.5 MG PO TABS: 2.0000 | ORAL_TABLET | Freq: Three times a day (TID) | ORAL | 6 refills | Status: DC

## 2023-12-07 MED ORDER — APIXABAN 5 MG PO TABS: 5.0000 mg | ORAL_TABLET | Freq: Two times a day (BID) | ORAL | 5 refills | Status: DC

## 2023-12-07 NOTE — Patient Instructions (Signed)
 Good to see you today!  INCREASE Bidil   to 2 tablets  Three times a day  Please follow up with our heart failure pharmacist in 3 weeks as scheduled  Your physician recommends that you schedule a follow-up appointment in: as scheduled    If you have any questions or concerns before your next appointment please send us  a message through [place] or call our office at [phone].    TO LEAVE A MESSAGE FOR THE NURSE SELECT OPTION 2, PLEASE LEAVE A MESSAGE INCLUDING: YOUR NAME DATE OF BIRTH CALL BACK NUMBER REASON FOR CALL**this is important as we prioritize the call backs  YOU WILL RECEIVE A CALL BACK THE SAME DAY AS LONG AS YOU CALL BEFORE 4:00 PM At the [facility], you and your health needs are our priority. As part of our continuing mission to provide you with exceptional heart care, we have created designated Provider Care Teams. These Care Teams include your primary Cardiologist (physician) and Advanced Practice Providers (APPs- Physician Assistants and Nurse Practitioners) who all work together to provide you with the care you need, when you need it.   You may see any of the following providers on your designated Care Team at your next follow up: Dr Toribio Fuel Dr Ezra Shuck Dr. Ria Commander Dr. Morene Brownie Amy Lenetta, NP Caffie Shed, [place] [facility] [place], [place] Beckey Coe, NP [place] Lee, NP Ellouise Class, NP Tinnie Redman, PharmD Jaun Bash, PharmD   Please be sure to bring in all your medications bottles to every appointment.    Thank you for choosing [facility]

## 2023-12-08 NOTE — Progress Notes (Signed)
 ADVANCED HF CLINIC NOTE   Primary Care: Vicci Barnie NOVAK, MD Primary Cardiologist: Lamar Fitch, MD HF Cardiologist: Dr. Rolan  Chief complaint: CHF  HPI: 53 y.o. 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    She's had history of noncompliance with medications and follow-up. Looking back through her chart, her blood pressure had been uncontrolled for a number of years.    Echo 1/21 showed EF 60-65%, grade I DD, RV normal.   Admitted 7/24 with heart failure, hypertensive urgency and AKI on CKD IV. BP 194/119. She was given IV lasix . Echo showed EF 20-25%, moderately dilated LV, grade II DD, RV normal. With no CP and mild hs-troponin elevation, did not undergo LHC. GDMT titrated and she was discharged home, weight 191 lbs.  Echo in 12/24 showed EF 30-35% with LV thrombus, diffuse hypokinesis, normal RV.   Today she returns for HF follow up with her daughter.  Weight up 2 lbs.  She has mild dyspnea walking up stairs but no problems walking on flat ground.  No lightheadedness, orthopnea, or PND.  No chest pain.  She follows closely with nephrology.  BP mildly elevated today.    ECG (personally reviewed): NSR, LVH  Labs (12/24): BNP 124, K 4.6, creatinine 5.7, hgb 10 Labs (1/25): K 4.7, creatinine 5.33, LDL 41, Fe ok Labs (7/25): K 5.1, creatinine 5.06, LDL 48  PMH: 1. HTN 2. Type 2 diabetes 3. Hyperlipidemia 4. Hypothyroidism 5. GERD  6. Chronic systolic CHF:  - Echo (5/17): EF 55-60%, G1DD, normal RV - Echo (7/19): EF 60-65%, G1DD, normal RV - Echo (1/21): EF 60-65%, G1DD, normal RV - Echo (7/24): EF 20-25%, moderately dilated LV, grade II DD, RV okay - Echo 12/24: EF 30-35%, LV with GHK, GIIDD, RV normal, small pericardial effusion, LV apical thrombus, mild MR 7. 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 30 tablet 2   carvedilol  (COREG ) 12.5 MG tablet Take 1 tablet (12.5 mg total) by mouth 2 (two) times daily with a meal. 60 tablet 5   glipiZIDE  (GLUCOTROL  XL) 5 MG 24 hr tablet Take 1 tablet (5 mg total) by mouth daily with breakfast. 90 tablet 1   glucose blood (TRUE METRIX BLOOD GLUCOSE TEST) test strip Use to check blood sugar 3 times daily. 100 each 3   levothyroxine  (SYNTHROID ) 50 MCG tablet Take 1 tablet (50 mcg total) by mouth daily. 90 tablet 1   pantoprazole  (PROTONIX ) 40 MG tablet Take 1 tablet (40 mg total) by mouth daily. 90 tablet 1   Semaglutide , 1 MG/DOSE, 4 MG/3ML SOPN Inject 1 mg as directed once a week. 3 mL 1   Semaglutide ,0.25 or 0.5MG /DOS, (OZEMPIC , 0.25 OR 0.5 MG/DOSE,) 2 MG/3ML SOPN Inject 0.5 mg into the skin once a week. Takes on Thurdays 3 mL 0   TRUEplus Lancets 28G MISC Use to check blood sugar 3 times daily. 100 each 3   apixaban  (ELIQUIS ) 5 MG TABS tablet Take 1 tablet (5 mg total) by mouth 2 (two) times daily. 60 tablet 5   furosemide  (LASIX ) 20 MG tablet Take 2 tablets (40 mg total) by mouth 2 (two) times daily. 90 tablet 2   isosorbide -hydrALAZINE  (BIDIL ) 20-37.5 MG tablet Take 2 tablets by mouth 3 (three)  times daily. 135 tablet 6   No current facility-administered medications for this encounter.   Allergies  Allergen Reactions   Ibuprofen Swelling   Social History   Socioeconomic History   Marital status: Single    Spouse name: Not on file   Number of children: Not on file   Years of education: Not on file   Highest education level: Not on file  Occupational History   Not on file  Tobacco Use   Smoking status: Never   Smokeless tobacco: Never  Vaping Use   Vaping status: Never Used  Substance and Sexual Activity   Alcohol use: Yes    Alcohol/week: 0.0 standard drinks of alcohol    Comment: occ   Drug use: No   Sexual activity: Not Currently    Birth  control/protection: None  Other Topics Concern   Not on file  Social History Narrative   She has a 24 you daughter   81 yo son in jail until 01/2017   Dating   Works as Advertising copywriter   Social Drivers of Corporate investment banker Strain: Not on file  Food Insecurity: No Food Insecurity (11/27/2023)   Hunger Vital Sign    Worried About Running Out of Food in the Last Year: Never true    Ran Out of Food in the Last Year: Never true  Transportation Needs: No Transportation Needs (11/27/2023)   PRAPARE - Administrator, Civil Service (Medical): No    Lack of Transportation (Non-Medical): No  Physical Activity: Patient Declined (11/27/2023)   Exercise Vital Sign    Days of Exercise per Week: Patient declined    Minutes of Exercise per Session: Patient declined  Stress: No Stress Concern Present (11/27/2023)   [employer] of Occupational Health - Occupational Stress Questionnaire    Feeling of Stress: Not at all  Social Connections: Moderately Integrated (11/27/2023)   Social Connection and Isolation Panel    Frequency of Communication with Friends and Family: Never    Frequency of Social Gatherings with Friends and Family: Three times a week    Attends Religious Services: More than 4 times per year    Active Member of Clubs or Organizations: Yes    Attends Banker Meetings: More than 4 times per year    Marital Status: Divorced  Intimate Partner Violence: Not At Risk (11/27/2023)   Humiliation, Afraid, Rape, and Kick questionnaire    Fear of Current or Ex-Partner: No    Emotionally Abused: No    Physically Abused: No    Sexually Abused: No   Family History  Problem Relation Age of Onset   Diabetes Mother    Diabetes Maternal Grandmother    Colon cancer Maternal Uncle 65   ROS: All systems reviewed and negative except as per HPI.   BP (!) 142/78   Pulse 77   Ht 5' 1 (1.549 m)   Wt 81.3 kg (179 lb 3.2 oz)   LMP 10/08/2013   SpO2 98%   BMI 33.86 kg/m    Wt Readings from Last 3 Encounters:  12/07/23 81.3 kg (179 lb 3.2 oz)  11/27/23 80.1 kg (176 lb 9.6 oz)  08/28/23 80.4 kg (177 lb 3.2 oz)   PHYSICAL EXAM: General: NAD Neck: No JVD, no thyromegaly or thyroid  nodule.  Lungs: Clear to auscultation bilaterally with normal respiratory effort. CV: Nondisplaced PMI.  Heart regular S1/S2, no S3/S4, no murmur.  No peripheral edema.  No carotid bruit.  Normal pedal  pulses.  Abdomen: Soft, nontender, no hepatosplenomegaly, no distention.  Skin: Intact without lesions or rashes.  Neurologic: Alert and oriented x 3.  Psych: Normal affect. Extremities: No clubbing or cyanosis.  HEENT: Normal.   ASSESSMENT & PLAN: 1. Chronic systolic CHF:  Echo in 7/24 with EF 20-25% with moderate LV dilation but normal wall thickness, normal RV, IVC not dilated.  Cause is uncertain, possibly due to long-standing HTN but walls are not thick. Poorly controlled DM could play a role.  She was said to have HF when her daughter was born but do not have echo available from then and echoes in 2019 and 2021 showed normal EF.  No ETOH or drugs.  She did have a brother who had a cardiac arrest, but he had multiple medical problems. There is not clearly a familial predisposition. No cath with CKD stage IV.  Echo 12/24 with EF 30-35%, global hypokinesis, GIIDD, RV normal, small pericardial effusion, LV apical thrombus, mild MR.  NYHA class II, not volume overloaded on exam.  GDMT limited by renal dysfunction.  - Continue Lasix  40 mg bid. Recent BMET today.   - Continue Coreg  12.5 mg bid.  - Increase Bidil  to 2 tabs tid.  No longer having headaches.  - GFR too low to start SGLT2 inhibitor, ARNI, spironolactone.  - We have discussed cardiac MRI but her nephrologist does not want her to get gadolinium.  - Repeat echo in 12/25.  2. LV apical thrombus: Continue Eliquis .  3. HTN: BP is mildly elevated.  Hard to know how much HTN contributed to cardiomyopathy as she has been a  long-time hypertensive and prior echoes have been normal.  Also, LV walls are not particularly thick.  - Increasing Bidil  as above.  4. Type 2 diabetes: She is on semaglutide .  5. CKD IV: Seeing nephrology.   Follow up with HF pharmacist in 3 wks to see if we can increase Coreg .  Follow up in 3 months with APP  I spent 32 minutes reviewing records, interviewing/examining patient, and managing order    Ezra Shuck  12/08/23

## 2023-12-10 ENCOUNTER — Ambulatory Visit (HOSPITAL_COMMUNITY)

## 2023-12-10 ENCOUNTER — Other Ambulatory Visit (HOSPITAL_COMMUNITY): Payer: Self-pay

## 2023-12-10 ENCOUNTER — Telehealth (HOSPITAL_COMMUNITY): Payer: Self-pay | Admitting: Pharmacy Technician

## 2023-12-10 NOTE — Telephone Encounter (Signed)
 Advanced Heart Failure Patient Advocate Encounter  Received generic Bidil  PA from [facility]. Patients plan prefers brand name Bidil . Was able to see that brand Bidil  is being filled at [facility] pharmacy. Copay $4. No further action needed at this time.  Almarie JULIANNA Pa, CPhT

## 2023-12-14 ENCOUNTER — Ambulatory Visit (HOSPITAL_COMMUNITY)

## 2023-12-17 ENCOUNTER — Ambulatory Visit (HOSPITAL_COMMUNITY)

## 2023-12-17 ENCOUNTER — Other Ambulatory Visit: Payer: Self-pay

## 2023-12-18 ENCOUNTER — Other Ambulatory Visit (HOSPITAL_COMMUNITY): Payer: Self-pay

## 2023-12-20 ENCOUNTER — Other Ambulatory Visit: Payer: Self-pay

## 2023-12-21 ENCOUNTER — Ambulatory Visit (HOSPITAL_COMMUNITY)

## 2023-12-21 ENCOUNTER — Other Ambulatory Visit: Payer: Self-pay

## 2023-12-21 NOTE — Telephone Encounter (Signed)
  Kidney Transplant Evaluation Note:  Pt chart reviewed by Dr. Jerrye Sieving on 12/21/23.  Pt did not meet selection criteria for listing per most recent policy.   Pt determined to be not a candidate for transplant at this time related to cardiac function. Per last echo on 05/15/23, pt's EF is 30-35%   TC to pt to explain NAC information listed above. Also, stated to pt that she can be re-referred if her EF improves. Pt stated that she is working with her cardiologist and has another echo scheduled for August. Pt given contact information for this Clinical research associate and encouraged to ask her nephrologist for a re-referral as her EF improves. Verified mailing address.   VM left for Dr. Melia Orts at [phone] with the [facility] information.  Letter sent to patient and faxed to Dr. Melia at [phone].

## 2023-12-21 NOTE — Progress Notes (Incomplete)
 ***In Progress***    Advanced Heart Failure Clinic Note   Primary Care: Vicci Barnie NOVAK, MD Primary Cardiologist: Lamar Fitch, MD HF Cardiologist: Dr. Rolan  HPI:  53 y.o. 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    She's had history of noncompliance with medications and follow-up. Looking back through her chart, her blood pressure had been uncontrolled for a number of years.    Echo 05/2019 showed EF 60-65%, grade I DD, RV normal.    Admitted 11/2022 with heart failure, hypertensive urgency and AKI on CKD IV. BP 194/119. She was given IV Lasix . Echo showed EF 20-25%, moderately dilated LV, grade II DD, RV normal. With no CP and mild hs-troponin elevation, did not undergo LHC. GDMT titrated and she was discharged home, weight 191 lbs.   Echo in 04/2023 showed EF 30-35% with LV thrombus, diffuse hypokinesis, normal RV.    Returned to [facility] for HF follow up with her daughter.  Weight was up 2 lbs.  She had mild dyspnea walking up stairs but no problems walking on flat ground.  No lightheadedness, orthopnea, or PND.  No chest pain.  She follows closely with nephrology.  BP mildly elevated in clinic.    Was being evaluated for kidney transplant by [facility] but determined to be not a candidate for transplant at this time related to cardiac function. She can be re-referred if her EF improves   Today she returns to HF clinic for pharmacist medication titration. At last visit with MD Bidil  was increased to 2 tablets TID.   Today he returns to HF clinic for pharmacist medication titration. At last visit with MD ***.   Overall feeling ***. Dizziness, lightheadedness, fatigue:  Chest pain or palpitations:  How is your breathing?: *** SOB: Able to complete all ADLs. Activity level ***  Weight at home pounds. Takes furosemide /torsemide/bumex *** mg *** daily.   LEE PND/Orthopnea  Appetite *** Low-salt diet:   Physical Exam Cost/affordability of meds  -no labs -inc coreg  to 25 per DM vs amlodipine  if BP high but can't inc bb -APP 10/17  Shortness of breath/dyspnea on exertion? {YES NO:22349}  Orthopnea/PND? {YES NO:22349} Edema? {YES NO:22349} Lightheadedness/dizziness? {YES NO:22349} Daily weights at home? {YES NO:22349} Blood pressure/heart rate monitoring at home? {YES [id number] Following low-sodium/fluid-restricted diet? {YES NO:22349}  HF Medications: Carvedilol  25 mg BID Bidil  2 tablets TID Lasix  40 mg BID  Has the patient been experiencing any side effects to the medications prescribed?  {YES NO:22349}  Does the patient have any problems obtaining medications due to transportation or finances?   {YES NO:22349}  Understanding of regimen: {excellent/good/fair/poor:19665} Understanding of indications: {excellent/good/fair/poor:19665} Potential of compliance: {excellent/good/fair/poor:19665} Patient understands to avoid NSAIDs. Patient understands to avoid decongestants.    Pertinent Lab Values: Serum creatinine ***, BUN ***, Potassium ***, Sodium ***, BNP ***, Magnesium ***, Digoxin ***   Vital Signs: Weight: *** (last clinic weight: ***) Blood pressure: ***  Heart rate: ***   Assessment/Plan: 1. Chronic systolic CHF:  Echo in 11/2022 with EF 20-25% with moderate LV dilation but normal wall thickness, normal RV, IVC not dilated.  Cause is uncertain, possibly due to long-standing HTN but walls are not thick. Poorly controlled DM could play a role.  She was said to have HF when her daughter was born but do not have echo available from then and echoes in 2019 and 2021 showed  normal EF.  No ETOH or drugs.  She did have a brother who had a cardiac arrest, but he had multiple medical problems. There is not clearly a familial predisposition. No cath with CKD stage IV.  Echo 12/24 with EF 30-35%, global hypokinesis, GIIDD, RV  normal, small pericardial effusion, LV apical thrombus, mild MR.  NYHA class II, not volume overloaded on exam.  GDMT limited by renal dysfunction.  - Continue Lasix  40 mg BID. - Continue carvedilol  12.5 mg BID.  - Continue Bidil  to 2 tabs TID.  No longer having headaches.  - GFR too low to start SGLT2 inhibitor, ARNI, spironolactone.  - Previously discussed cardiac MRI but her nephrologist does not want her to get gadolinium.  - Repeat echo in 04/2024.  2. LV apical thrombus: Continue Eliquis .  3. HTN: BP is mildly elevated.  Hard to know how much HTN contributed to cardiomyopathy as she has been a long-time hypertensive and prior echoes have been normal.  Also, LV walls are not particularly thick.  - Increasing Bidil  as above. *** 4. Type 2 diabetes: She is on semaglutide .  5. CKD IV: Seeing nephrology.   Follow up ***   Tinnie Redman, PharmD, BCPS, BCCP, CPP Heart Failure Clinic Pharmacist [phone]

## 2023-12-24 ENCOUNTER — Other Ambulatory Visit: Payer: Self-pay

## 2023-12-28 ENCOUNTER — Encounter

## 2023-12-30 ENCOUNTER — Ambulatory Visit (HOSPITAL_BASED_OUTPATIENT_CLINIC_OR_DEPARTMENT_OTHER): Attending: Cardiology | Admitting: Cardiology

## 2024-01-02 ENCOUNTER — Other Ambulatory Visit (HOSPITAL_COMMUNITY)

## 2024-01-13 ENCOUNTER — Other Ambulatory Visit: Payer: Self-pay | Admitting: Physician Assistant

## 2024-01-13 ENCOUNTER — Other Ambulatory Visit: Payer: Self-pay | Admitting: Internal Medicine

## 2024-01-13 DIAGNOSIS — Z7984 Long term (current) use of oral hypoglycemic drugs: Secondary | ICD-10-CM

## 2024-01-13 DIAGNOSIS — E1165 Type 2 diabetes mellitus with hyperglycemia: Secondary | ICD-10-CM

## 2024-01-31 ENCOUNTER — Ambulatory Visit

## 2024-02-07 ENCOUNTER — Other Ambulatory Visit: Payer: Self-pay

## 2024-02-07 ENCOUNTER — Other Ambulatory Visit: Payer: Self-pay | Admitting: Internal Medicine

## 2024-02-07 DIAGNOSIS — E1165 Type 2 diabetes mellitus with hyperglycemia: Secondary | ICD-10-CM

## 2024-02-07 DIAGNOSIS — Z7985 Long-term (current) use of injectable non-insulin antidiabetic drugs: Secondary | ICD-10-CM

## 2024-02-07 NOTE — Telephone Encounter (Signed)
 Copied from CRM #[id number]. Topic: Clinical - Medication Refill >> Feb 07, 2024  9:22 AM Pinkey ORN wrote: Medication: Semaglutide ,0.25 or 0.5MG /DOS, (OZEMPIC , 0.25 OR 0.5 MG/DOSE,) 2 MG/3ML SOPN  Has the patient contacted their pharmacy? Yes (Agent: If no, request that the patient contact the pharmacy for the refill. If patient does not wish to contact the pharmacy document the reason why and proceed with request.) (Agent: If yes, when and what did the pharmacy advise?)  This is the patient's preferred pharmacy:  [id number] - [place], [place] - [address] [place] [address] Phone: [phone] Fax: [phone]   Is this the correct pharmacy for this prescription? Yes If no, delete pharmacy and type the correct one.   Has the prescription been filled recently? Yes  Is the patient out of the medication? No  Has the patient been seen for an appointment in the last year OR does the patient have an upcoming appointment? Yes  Can we respond through MyChart? No  Agent: Please be advised that Rx refills may take up to 3 business days. We ask that you follow-up with your pharmacy.

## 2024-02-08 ENCOUNTER — Other Ambulatory Visit: Payer: Self-pay

## 2024-02-08 MED ORDER — OZEMPIC (0.25 OR 0.5 MG/DOSE) 2 MG/3ML ~~LOC~~ SOPN: 0.5000 mg | PEN_INJECTOR | SUBCUTANEOUS | 0 refills | Status: DC

## 2024-02-08 NOTE — Telephone Encounter (Signed)
 Requested medication (s) are due for refill today: yes  Requested medication (s) are on the active medication list: yes  Last refill:  Semaglutide  0.5 mg on 11/21/23 3 ml 1 RF  Future visit scheduled: yes  Notes to clinic:  this dose and 1 mg dose are still on active med list.    Requested Prescriptions  Pending Prescriptions Disp Refills   Semaglutide ,0.25 or 0.5MG /DOS, (OZEMPIC , 0.25 OR 0.5 MG/DOSE,) 2 MG/3ML SOPN 3 mL 0    Sig: Inject 0.5 mg into the skin once a week. Takes on Thurdays     Endocrinology:  Diabetes - GLP-1 Receptor Agonists - semaglutide  Failed - 02/08/2024  7:54 AM      Failed - Cr in normal range and within 360 days    Creat  Date Value Ref Range Status  07/11/2016 1.21 (H) 0.50 - 1.10 mg/dL Final   Creatinine, Ser  Date Value Ref Range Status  11/27/2023 5.06 (H) 0.57 - 1.00 mg/dL Final   Creatinine, POC  Date Value Ref Range Status  01/08/2017 100 mg/dL Final   Creatinine, Urine  Date Value Ref Range Status  04/13/2015 34 20 - 320 mg/dL Final         Passed - HBA1C in normal range and within 180 days    HbA1c, POC (controlled diabetic range)  Date Value Ref Range Status  05/09/2023 5.4 0.0 - 7.0 % Final   Hgb A1c MFr Bld  Date Value Ref Range Status  11/27/2023 5.2 4.8 - 5.6 % Final    Comment:             Prediabetes: 5.7 - 6.4          Diabetes: >6.4          Glycemic control for adults with diabetes: <7.0          Passed - Valid encounter within last 6 months    Recent Outpatient Visits           2 months ago Hypertension associated with stage 5 chronic kidney disease due to type 2 diabetes mellitus (HCC)   [facility] [place]. [facility] [place], [place] W, NP   9 months ago Type 2 diabetes mellitus with hyperglycemia, without long-term current use of insulin  [facility])   [facility] [place]. [facility] [place], [place], [place]   1 year ago Type 2 diabetes  mellitus with hyperglycemia, without long-term current use of insulin  [facility])   [facility] [place]. [facility] Delbert Clam, MD   2 years ago Essential hypertension   [facility] [place]. [facility] Theotis Haze ORN, NP   3 years ago Primary hypertension   [facility] [place]. [facility] Theotis Haze ORN, NP       Future Appointments             In 3 weeks Vicci Barnie NOVAK, MD [facility] [place]. [facility], [place]

## 2024-02-27 ENCOUNTER — Telehealth: Payer: Self-pay | Admitting: Internal Medicine

## 2024-02-27 NOTE — Telephone Encounter (Signed)
 Patient confirmed appointment for 02/29/2024 through volunteer call.

## 2024-02-29 ENCOUNTER — Ambulatory Visit: Admitting: Internal Medicine

## 2024-03-04 ENCOUNTER — Other Ambulatory Visit: Payer: Self-pay | Admitting: Nurse Practitioner

## 2024-03-04 ENCOUNTER — Other Ambulatory Visit: Payer: Self-pay | Admitting: Internal Medicine

## 2024-03-04 ENCOUNTER — Telehealth: Payer: Self-pay

## 2024-03-04 DIAGNOSIS — E039 Hypothyroidism, unspecified: Secondary | ICD-10-CM

## 2024-03-04 DIAGNOSIS — E119 Type 2 diabetes mellitus without complications: Secondary | ICD-10-CM

## 2024-03-04 NOTE — Telephone Encounter (Unsigned)
 Copied from CRM [id number]. Topic: Clinical - Medication Refill >> Mar 04, 2024 10:00 AM Dedra B wrote: Medication: levothyroxine  (SYNTHROID ) 50 MCG tablet  Has the patient contacted their pharmacy? Yes, no refills   This is the patient's preferred pharmacy:  [id number] - [place], [place] - [address] [place] [address] Phone: [phone] Fax: [phone]   Is this the correct pharmacy for this prescription? Yes  Has the prescription been filled recently? No  Is the patient out of the medication? Yes  Has the patient been seen for an appointment in the last year OR does the patient have an upcoming appointment? Yes  Can we respond through MyChart? No  Agent: Please be advised that Rx refills may take up to 3 business days. We ask that you follow-up with your pharmacy.

## 2024-03-04 NOTE — Telephone Encounter (Signed)
 The highest dose of Ozempic  is 2 mg.  It does not come in 5 mg.

## 2024-03-04 NOTE — Telephone Encounter (Signed)
**Note De-identified  [place] Obfuscation** Please advise 

## 2024-03-04 NOTE — Telephone Encounter (Signed)
 Copied from CRM [id number]. Topic: Clinical - Prescription Issue >> Mar 04, 2024 10:02 AM Linda Phelps wrote: Reason for CRM: Pt said her Ozempic  was increased to 5 mg at her last appt. She went to pick up her prescription and it was for 2 mg, which is the wrong dosage.

## 2024-03-04 NOTE — Telephone Encounter (Signed)
 Copied from CRM #[id number]. Topic: Clinical - Prescription Issue >> Mar 04, 2024  3:13 PM Jasmin G wrote: Reason for CRM: Pt's preferred pharmacy, [id number] - [place], [place] - [address] called due pt stating that she received the incorrect dosage on her Semaglutide ,0.25 or 0.5MG /DOS, (OZEMPIC , 0.25 OR 0.5 MG/DOSE,) 2 MG/3ML SOPN, pt states that it should've a 1 MG refill. Call pt back if needed at [phone].

## 2024-03-05 ENCOUNTER — Other Ambulatory Visit: Payer: Self-pay | Admitting: Internal Medicine

## 2024-03-05 DIAGNOSIS — E039 Hypothyroidism, unspecified: Secondary | ICD-10-CM

## 2024-03-05 MED ORDER — LEVOTHYROXINE SODIUM 50 MCG PO TABS: 50.0000 ug | ORAL_TABLET | Freq: Every day | ORAL | 0 refills | Status: AC

## 2024-03-05 MED ORDER — LEVOTHYROXINE SODIUM 50 MCG PO TABS: 50.0000 ug | ORAL_TABLET | Freq: Every day | ORAL | 0 refills | Status: DC

## 2024-03-05 NOTE — Addendum Note (Signed)
 Addended by: VICCI SOBER B on: 03/05/2024 01:14 PM   Modules accepted: Orders

## 2024-03-05 NOTE — Telephone Encounter (Signed)
 This is the 2nd message that I am receiving on this pt regarding the dose of Ozempic . I have personally not seen pt since 2021 and appt that was scheduled for 02/29/2024 was cancelled. She last saw NP Theotis in July. I need clarification from her as to the dose of Ozempic  she is currently taking and the refill dose she is requesting. Appt has been rescheduled for 04/01/24. Please kept that appt. I will not give any refills beyond that date if appt is not kept.

## 2024-03-05 NOTE — Telephone Encounter (Signed)
 Spoke with patient . Patient voiced that the last Ozempic  prescription she picked up was OZEMPIC  4MG /3ML 1MG /DOSE PEN  1 Milligram(s) SUB-Q Once a Week -- the last  one she got.  This time was OZEMPIC  0.25-0.5 MG/DOSE PEN  inject 0.5 mg into the skin once a week.  Patient said that what she picked up this time was way  different. She said that she suppose to be on the highest dose. Please advise.

## 2024-03-05 NOTE — Telephone Encounter (Signed)
 PC placed to pt after I received message from RN Pt states she was on the 1 mg of Ozempic  after seeing NP in July. However, she reports dizziness that would last for several days each time she takes the 1 mg. This did not occur when she was on the 0.5 mg. Denies any abdominal pain, diarrhea, vomiting or palpitations.  I inquired about her fluid intake and pt states she recently has started to drink more water. Advise to drink several glasses water daily. Given option of staying on the 1 mg or dec dose back to 0.5 mg given the dizziness. She agreed and opted to decrease to 0.5 mg. She currently has the 0.5 mg pens so she will stay on that dose until she sees me next mth. Pt also requested RF on Levothyroxine . I confirmed the 50 mcg dose. Rxn sent.

## 2024-03-06 ENCOUNTER — Telehealth (HOSPITAL_COMMUNITY): Payer: Self-pay

## 2024-03-06 NOTE — Telephone Encounter (Signed)
 Called to confirm/remind patient of their appointment at the Advanced Heart Failure Clinic on 03/07/24.   Appointment:   [x] Confirmed  [] Left mess   [] No answer/No voice mail  [] VM Full/unable to leave message  [] Phone not in service  Patient reminded to bring all medications and/or complete list.  Confirmed patient has transportation. Gave directions, instructed to utilize valet parking.

## 2024-03-07 ENCOUNTER — Encounter (HOSPITAL_COMMUNITY)

## 2024-03-18 ENCOUNTER — Telehealth (HOSPITAL_COMMUNITY): Payer: Self-pay

## 2024-03-18 NOTE — Telephone Encounter (Signed)
 Called to confirm/remind patient of their appointment at the Advanced Heart Failure Clinic on 03/13/24 2:00.   Appointment:   [] Confirmed  [] Left mess   [x] No answer/No voice mail  [] VM Full/unable to leave message  [] Phone not in service  Patient reminded to bring all medications and/or complete list.  Confirmed patient has transportation. Gave directions, instructed to utilize valet parking.

## 2024-03-19 ENCOUNTER — Ambulatory Visit (HOSPITAL_COMMUNITY)

## 2024-03-19 NOTE — Progress Notes (Incomplete)
 ADVANCED HF CLINIC NOTE   Primary Care: Vicci Barnie NOVAK, MD Primary Cardiologist: Lamar Fitch, MD HF Cardiologist: Dr. Rolan  Chief complaint: CHF  HPI: 53 y.o. 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    She's had history of noncompliance with medications and follow-up. Looking back through her chart, her blood pressure had been uncontrolled for a number of years.    Echo 1/21 showed EF 60-65%, grade I DD, RV normal.   Admitted 7/24 with heart failure, hypertensive urgency and AKI on CKD IV. BP 194/119. She was given IV lasix . Echo showed EF 20-25%, moderately dilated LV, grade II DD, RV normal. With no CP and mild hs-troponin elevation, did not undergo LHC. GDMT titrated and she was discharged home, weight 191 lbs.  Echo in 12/24 showed EF 30-35% with LV thrombus, diffuse hypokinesis, normal RV.   Here today for 3 month CHF follow-up.   ECG (personally reviewed): NSR, LVH  Labs (12/24): BNP 124, K 4.6, creatinine 5.7, hgb 10 Labs (1/25): K 4.7, creatinine 5.33, LDL 41, Fe ok Labs (7/25): K 5.1, creatinine 5.06, LDL 48  PMH: 1. HTN 2. Type 2 diabetes 3. Hyperlipidemia 4. Hypothyroidism 5. GERD  6. Chronic systolic CHF:  - Echo (5/17): EF 55-60%, G1DD, normal RV - Echo (7/19): EF 60-65%, G1DD, normal RV - Echo (1/21): EF 60-65%, G1DD, normal RV - Echo (7/24): EF 20-25%, moderately dilated LV, grade II DD, RV okay - Echo 12/24: EF 30-35%, LV with GHK, GIIDD, RV normal, small pericardial effusion, LV apical thrombus, mild MR 7. CKD stage IV 8. LV apical thrombus  Current Outpatient Medications  Medication Sig Dispense Refill   apixaban  (ELIQUIS ) 5 MG TABS tablet Take 1 tablet (5 mg total) by mouth 2 (two) times daily. 60 tablet 5   atorvastatin  (LIPITOR) 10 MG tablet Take 1 tablet (10 mg total) by mouth daily. 30 tablet 2   carvedilol  (COREG ) 12.5 MG  tablet Take 1 tablet (12.5 mg total) by mouth 2 (two) times daily with a meal. 60 tablet 5   furosemide  (LASIX ) 20 MG tablet Take 2 tablets (40 mg total) by mouth 2 (two) times daily. 90 tablet 2   glipiZIDE  (GLUCOTROL  XL) 5 MG 24 hr tablet Take 1 tablet by mouth once daily with breakfast 90 tablet 1   glucose blood (TRUE METRIX BLOOD GLUCOSE TEST) test strip Use to check blood sugar 3 times daily. 100 each 3   isosorbide -hydrALAZINE  (BIDIL ) 20-37.5 MG tablet Take 2 tablets by mouth 3 (three) times daily. 135 tablet 6   levothyroxine  (SYNTHROID ) 50 MCG tablet Take 1 tablet (50 mcg total) by mouth daily. 90 tablet 0   pantoprazole  (PROTONIX ) 40 MG tablet Take 1 tablet (40 mg total) by mouth daily. 90 tablet 1   Semaglutide ,0.25 or 0.5MG /DOS, (OZEMPIC , 0.25 OR 0.5 MG/DOSE,) 2 MG/3ML SOPN Inject 0.5 mg into the skin once a week. Takes on Thurdays 3 mL 0   triamcinolone  (KENALOG ) 0.025 % ointment APPLY 1 APPLICATION  OINTMENT TOPICALLY TWICE DAILY 60 g 0   TRUEplus Lancets 28G MISC Use to check blood sugar 3 times daily. 100 each 3   No current facility-administered medications for this visit.   Allergies  Allergen Reactions   Ibuprofen Swelling   Social History   Socioeconomic History   Marital status: Single    Spouse name: Not on file   Number  of children: Not on file   Years of education: Not on file   Highest education level: Not on file  Occupational History   Not on file  Tobacco Use   Smoking status: Never   Smokeless tobacco: Never  Vaping Use   Vaping status: Never Used  Substance and Sexual Activity   Alcohol use: Yes    Alcohol/week: 0.0 standard drinks of alcohol    Comment: occ   Drug use: No   Sexual activity: Not Currently    Birth control/protection: None  Other Topics Concern   Not on file  Social History Narrative   She has a 68 you daughter   19 yo son in jail until 01/2017   Dating   Works as advertising copywriter   Social Drivers of Corporate Investment Banker  Strain: Not on file  Food Insecurity: No Food Insecurity (11/27/2023)   Hunger Vital Sign    Worried About Running Out of Food in the Last Year: Never true    Ran Out of Food in the Last Year: Never true  Transportation Needs: No Transportation Needs (11/27/2023)   PRAPARE - Administrator, Civil Service (Medical): No    Lack of Transportation (Non-Medical): No  Physical Activity: Patient Declined (11/27/2023)   Exercise Vital Sign    Days of Exercise per Week: Patient declined    Minutes of Exercise per Session: Patient declined  Stress: No Stress Concern Present (11/27/2023)   [employer] of Occupational Health - Occupational Stress Questionnaire    Feeling of Stress: Not at all  Social Connections: Moderately Integrated (11/27/2023)   Social Connection and Isolation Panel    Frequency of Communication with Friends and Family: Never    Frequency of Social Gatherings with Friends and Family: Three times a week    Attends Religious Services: More than 4 times per year    Active Member of Clubs or Organizations: Yes    Attends Banker Meetings: More than 4 times per year    Marital Status: Divorced  Intimate Partner Violence: Not At Risk (11/27/2023)   Humiliation, Afraid, Rape, and Kick questionnaire    Fear of Current or Ex-Partner: No    Emotionally Abused: No    Physically Abused: No    Sexually Abused: No   Family History  Problem Relation Age of Onset   Diabetes Mother    Diabetes Maternal Grandmother    Colon cancer Maternal Uncle 65   ROS: All systems reviewed and negative except as per HPI.   LMP 10/08/2013   Wt Readings from Last 3 Encounters:  12/07/23 81.3 kg (179 lb 3.2 oz)  11/27/23 80.1 kg (176 lb 9.6 oz)  08/28/23 80.4 kg (177 lb 3.2 oz)   PHYSICAL EXAM: General: NAD Neck: No JVD, no thyromegaly or thyroid  nodule.  Lungs: Clear to auscultation bilaterally with normal respiratory effort. CV: Nondisplaced PMI.  Heart regular S1/S2, no  S3/S4, no murmur.  No peripheral edema.  No carotid bruit.  Normal pedal pulses.  Abdomen: Soft, nontender, no hepatosplenomegaly, no distention.  Skin: Intact without lesions or rashes.  Neurologic: Alert and oriented x 3.  Psych: Normal affect. Extremities: No clubbing or cyanosis.  HEENT: Normal.   ASSESSMENT & PLAN: 1. Chronic systolic CHF:  Echo in 7/24 with EF 20-25% with moderate LV dilation but normal wall thickness, normal RV, IVC not dilated.  Cause is uncertain, possibly due to long-standing HTN but walls are not thick. Poorly controlled DM could  play a role.  She was said to have HF when her daughter was born but do not have echo available from then and echoes in 2019 and 2021 showed normal EF.  No ETOH or drugs.  She did have a brother who had a cardiac arrest, but he had multiple medical problems. There is not clearly a familial predisposition. No cath with CKD stage IV.  Echo 12/24 with EF 30-35%, global hypokinesis, GIIDD, RV normal, small pericardial effusion, LV apical thrombus, mild MR.  NYHA class II, not volume overloaded on exam.  GDMT limited by renal dysfunction.  - Continue Lasix  40 mg bid. Recent BMET today.   - Continue Coreg  12.5 mg bid.  - Increase Bidil  to 2 tabs tid.  No longer having headaches.  - GFR too low to start SGLT2 inhibitor, ARNI, spironolactone.  - We have discussed cardiac MRI but her nephrologist does not want her to get gadolinium.  - Repeat echo in 12/25.  2. LV apical thrombus: Continue Eliquis .  3. HTN: BP is mildly elevated.  Hard to know how much HTN contributed to cardiomyopathy as she has been a long-time hypertensive and prior echoes have been normal.  Also, LV walls are not particularly thick.  - Increasing Bidil  as above.  4. Type 2 diabetes: She is on semaglutide .  5. CKD IV: Seeing nephrology.   Follow up    [facility], Esiah Bazinet N  03/19/24

## 2024-03-21 ENCOUNTER — Other Ambulatory Visit: Payer: Self-pay

## 2024-03-25 ENCOUNTER — Other Ambulatory Visit: Payer: Self-pay

## 2024-03-25 ENCOUNTER — Ambulatory Visit: Admitting: Internal Medicine

## 2024-03-31 ENCOUNTER — Other Ambulatory Visit: Payer: Self-pay | Admitting: Internal Medicine

## 2024-03-31 DIAGNOSIS — Z7985 Long-term (current) use of injectable non-insulin antidiabetic drugs: Secondary | ICD-10-CM

## 2024-03-31 DIAGNOSIS — E1165 Type 2 diabetes mellitus with hyperglycemia: Secondary | ICD-10-CM

## 2024-03-31 DIAGNOSIS — E7800 Pure hypercholesterolemia, unspecified: Secondary | ICD-10-CM

## 2024-03-31 DIAGNOSIS — E039 Hypothyroidism, unspecified: Secondary | ICD-10-CM

## 2024-03-31 NOTE — Telephone Encounter (Unsigned)
 Copied from CRM [id number]. Topic: Clinical - Medication Refill >> Mar 31, 2024  9:05 AM Joesph B wrote: Medication: Patient is needing her meds refilled, she had a 3 month FU 04/01/24 but the office cancelled, she has been rescheduled for December.  Semaglutide ,0.25 or 0.5MG /DOS, (OZEMPIC , 0.25 OR 0.5 MG/DOSE,) 2 MG/3ML SOPN   atorvastatin  (LIPITOR) 10 MG tablet   levothyroxine  (SYNTHROID ) 50 MCG tablet  Has the patient contacted their pharmacy? Yes (Agent: If no, request that the patient contact the pharmacy for the refill. If patient does not wish to contact the pharmacy document the reason why and proceed with request.) (Agent: If yes, when and what did the pharmacy advise?)  This is the patient's preferred pharmacy:  [id number] - [place], [place] - [address] [place] [address] Phone: [phone] Fax: [phone]    Is this the correct pharmacy for this prescription? Yes If no, delete pharmacy and type the correct one.   Has the prescription been filled recently? Yes  Is the patient out of the medication? Yes  Has the patient been seen for an appointment in the last year OR does the patient have an upcoming appointment? Yes  Can we respond through MyChart? No  Agent: Please be advised that Rx refills may take up to 3 business days. We ask that you follow-up with your pharmacy.

## 2024-04-01 ENCOUNTER — Ambulatory Visit: Admitting: Internal Medicine

## 2024-04-01 MED ORDER — OZEMPIC (0.25 OR 0.5 MG/DOSE) 2 MG/3ML ~~LOC~~ SOPN: 0.5000 mg | PEN_INJECTOR | SUBCUTANEOUS | 2 refills | Status: DC

## 2024-04-01 MED ORDER — ATORVASTATIN CALCIUM 10 MG PO TABS: 10.0000 mg | ORAL_TABLET | Freq: Every day | ORAL | 1 refills | Status: AC

## 2024-04-01 NOTE — Telephone Encounter (Signed)
 Requested Prescriptions  Pending Prescriptions Disp Refills   Semaglutide ,0.25 or 0.5MG /DOS, (OZEMPIC , 0.25 OR 0.5 MG/DOSE,) 2 MG/3ML SOPN 3 mL 0    Sig: Inject 0.5 mg into the skin once a week. Takes on Thurdays     Endocrinology:  Diabetes - GLP-1 Receptor Agonists - semaglutide  Failed - 04/01/2024  3:38 PM      Failed - Cr in normal range and within 360 days    Creat  Date Value Ref Range Status  07/11/2016 1.21 (H) 0.50 - 1.10 mg/dL Final   Creatinine, Ser  Date Value Ref Range Status  11/27/2023 5.06 (H) 0.57 - 1.00 mg/dL Final   Creatinine, POC  Date Value Ref Range Status  01/08/2017 100 mg/dL Final   Creatinine, Urine  Date Value Ref Range Status  04/13/2015 34 20 - 320 mg/dL Final         Passed - HBA1C in normal range and within 180 days    HbA1c, POC (controlled diabetic range)  Date Value Ref Range Status  05/09/2023 5.4 0.0 - 7.0 % Final   Hgb A1c MFr Bld  Date Value Ref Range Status  11/27/2023 5.2 4.8 - 5.6 % Final    Comment:             Prediabetes: 5.7 - 6.4          Diabetes: >6.4          Glycemic control for adults with diabetes: <7.0          Passed - Valid encounter within last 6 months    Recent Outpatient Visits           4 months ago Hypertension associated with stage 5 chronic kidney disease due to type 2 diabetes mellitus (HCC)   [facility] [place]. [facility] [place], [place] W, NP   10 months ago Type 2 diabetes mellitus with hyperglycemia, without long-term current use of insulin  [facility])   [facility] [place]. [facility] [place], [place], [place]   1 year ago Type 2 diabetes mellitus with hyperglycemia, without long-term current use of insulin  [facility])   [place] [facility] [place]. [facility] Delbert Clam, MD   2 years ago Essential hypertension   [place] [facility] [place]. [facility] Theotis Haze ORN, NP   3 years ago Primary hypertension    [facility] [place]. [facility] [place], [place] W, NP               atorvastatin  (LIPITOR) 10 MG tablet 90 tablet 1    Sig: Take 1 tablet (10 mg total) by mouth daily.     Cardiovascular:  Antilipid - Statins Failed - 04/01/2024  3:38 PM      Failed - Lipid Panel in normal range within the last 12 months    Cholesterol, Total  Date Value Ref Range Status  11/27/2023 102 100 - 199 mg/dL Final   LDL Chol Calc (NIH)  Date Value Ref Range Status  11/27/2023 48 0 - 99 mg/dL Final   HDL  Date Value Ref Range Status  11/27/2023 36 (L) >39 mg/dL Final   Triglycerides  Date Value Ref Range Status  11/27/2023 89 0 - 149 mg/dL Final         Passed -  Patient is not pregnant      Passed - Valid encounter within last 12 months    Recent Outpatient Visits           4 months ago Hypertension associated with stage 5 chronic kidney disease due to type 2 diabetes mellitus (HCC)   [place] [facility] [place]. [facility] [place], [place] W, NP   10 months ago Type 2 diabetes mellitus with hyperglycemia, without long-term current use of insulin  [facility])   [place] [facility] [place]. [facility] [place], [place], [place]   1 year ago Type 2 diabetes mellitus with hyperglycemia, without long-term current use of insulin  [facility])   [place] [facility] Of [place]. [facility] Delbert Clam, MD   2 years ago Essential hypertension   [place] [facility] [place]. [facility] Theotis Haze ORN, NP   3 years ago Primary hypertension   [place] [facility] [place]. [facility] [place], [place] W, NP               levothyroxine  (SYNTHROID ) 50 MCG tablet 90 tablet 0    Sig: Take 1 tablet (50 mcg total) by mouth daily.      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 [facility] [place], [place] W, NP   10 months ago Type 2 diabetes mellitus with hyperglycemia, without long-term current use of insulin  [facility])   [place] [facility] [place]. [facility] [place], [place], [place]   1 year ago Type 2 diabetes mellitus with hyperglycemia, without long-term current use of insulin  [facility])   [place] [facility] [place]. [facility] Delbert Clam, MD   2 years ago Essential hypertension   [place] [facility] [place]. [facility] Theotis Haze ORN, NP   3 years ago Primary hypertension   [place] [facility] Of [place]. [facility] Theotis Haze ORN, [place]

## 2024-04-20 ENCOUNTER — Other Ambulatory Visit (HOSPITAL_COMMUNITY): Payer: Self-pay | Admitting: Cardiology

## 2024-04-20 DIAGNOSIS — I10 Essential (primary) hypertension: Secondary | ICD-10-CM

## 2024-04-28 NOTE — Progress Notes (Incomplete)
 ADVANCED HF CLINIC NOTE   Primary Care: Vicci Barnie NOVAK, MD Primary Cardiologist: Lamar Fitch, MD HF Cardiologist: Dr. Rolan  Chief complaint: CHF  HPI: 53 y.o. 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    She's had history of noncompliance with medications and follow-up. Looking back through her chart, her blood pressure had been uncontrolled for a number of years.    Echo 1/21 showed EF 60-65%, grade I DD, RV normal.   Admitted 7/24 with heart failure, hypertensive urgency and AKI on CKD IV. BP 194/119. She was given IV lasix . Echo showed EF 20-25%, moderately dilated LV, grade II DD, RV normal. With no CP and mild hs-troponin elevation, did not undergo LHC. GDMT titrated and she was discharged home, weight 191 lbs.  Echo in 12/24: EF 30-35% with LV thrombus, diffuse hypokinesis, normal RV.   Today she returns for AHF follow up. Overall feeling ***. Denies palpitations, CP, dizziness, edema, or PND/Orthopnea. *** SOB. Appetite ok. No fever or chills. Weight at home *** pounds. Taking all medications. Denies ETOH, tobacco or drug use. Followed closely by nephrology.   ECG (personally reviewed from 7/25): NSR, LVH   PMH: 1. HTN 2. Type 2 diabetes 3. Hyperlipidemia 4. Hypothyroidism 5. GERD  6. Chronic systolic CHF:  - Echo (5/17): EF 55-60%, G1DD, normal RV - Echo (7/19): EF 60-65%, G1DD, normal RV - Echo (1/21): EF 60-65%, G1DD, normal RV - Echo (7/24): EF 20-25%, moderately dilated LV, grade II DD, RV okay - Echo 12/24: EF 30-35%, LV with GHK, GIIDD, RV normal, small pericardial effusion, LV apical thrombus, mild MR 7. CKD stage IV 8. LV apical thrombus  Current Outpatient Medications  Medication Sig Dispense Refill   apixaban  (ELIQUIS ) 5 MG TABS tablet Take 1 tablet (5 mg total) by mouth 2 (two) times daily. 60 tablet 5   atorvastatin  (LIPITOR) 10 MG  tablet Take 1 tablet (10 mg total) by mouth daily. 90 tablet 1   carvedilol  (COREG ) 12.5 MG tablet Take 1 tablet (12.5 mg total) by mouth 2 (two) times daily with a meal. 60 tablet 5   furosemide  (LASIX ) 20 MG tablet Take 2 tablets (40 mg total) by mouth 2 (two) times daily. PLEASE SCHEDULE APPOINTMENT FOR MORE REFILLS [phone] OPTION 2 90 tablet 0   glipiZIDE  (GLUCOTROL  XL) 5 MG 24 hr tablet Take 1 tablet by mouth once daily with breakfast 90 tablet 1   glucose blood (TRUE METRIX BLOOD GLUCOSE TEST) test strip Use to check blood sugar 3 times daily. 100 each 3   isosorbide -hydrALAZINE  (BIDIL ) 20-37.5 MG tablet Take 2 tablets by mouth 3 (three) times daily. 135 tablet 6   levothyroxine  (SYNTHROID ) 50 MCG tablet Take 1 tablet (50 mcg total) by mouth daily. 90 tablet 0   pantoprazole  (PROTONIX ) 40 MG tablet Take 1 tablet (40 mg total) by mouth daily. 90 tablet 1   Semaglutide ,0.25 or 0.5MG /DOS, (OZEMPIC , 0.25 OR 0.5 MG/DOSE,) 2 MG/3ML SOPN Inject 0.5 mg into the skin once a week. Takes on Thurdays 3 mL 2   triamcinolone  (KENALOG ) 0.025 % ointment APPLY 1 APPLICATION  OINTMENT TOPICALLY TWICE DAILY 60 g 0   TRUEplus Lancets 28G MISC Use to check blood sugar 3 times daily. 100 each 3   No current facility-administered medications for this visit.   Allergies  Allergen Reactions   Ibuprofen Swelling   Social History  Socioeconomic History   Marital status: Single    Spouse name: Not on file   Number of children: Not on file   Years of education: Not on file   Highest education level: Not on file  Occupational History   Not on file  Tobacco Use   Smoking status: Never   Smokeless tobacco: Never  Vaping Use   Vaping status: Never Used  Substance and Sexual Activity   Alcohol use: Yes    Alcohol/week: 0.0 standard drinks of alcohol    Comment: occ   Drug use: No   Sexual activity: Not Currently    Birth control/protection: None  Other Topics Concern   Not on file  Social History  Narrative   She has a 31 you daughter   21 yo son in jail until 01/2017   Dating   Works as advertising copywriter   Social Drivers of Corporate Investment Banker Strain: Not on file  Food Insecurity: No Food Insecurity (11/27/2023)   Hunger Vital Sign    Worried About Running Out of Food in the Last Year: Never true    Ran Out of Food in the Last Year: Never true  Transportation Needs: No Transportation Needs (11/27/2023)   PRAPARE - Administrator, Civil Service (Medical): No    Lack of Transportation (Non-Medical): No  Physical Activity: Patient Declined (11/27/2023)   Exercise Vital Sign    Days of Exercise per Week: Patient declined    Minutes of Exercise per Session: Patient declined  Stress: No Stress Concern Present (11/27/2023)   [employer] of Occupational Health - Occupational Stress Questionnaire    Feeling of Stress: Not at all  Social Connections: Moderately Integrated (11/27/2023)   Social Connection and Isolation Panel    Frequency of Communication with Friends and Family: Never    Frequency of Social Gatherings with Friends and Family: Three times a week    Attends Religious Services: More than 4 times per year    Active Member of Clubs or Organizations: Yes    Attends Banker Meetings: More than 4 times per year    Marital Status: Divorced  Intimate Partner Violence: Not At Risk (11/27/2023)   Humiliation, Afraid, Rape, and Kick questionnaire    Fear of Current or Ex-Partner: No    Emotionally Abused: No    Physically Abused: No    Sexually Abused: No   Family History  Problem Relation Age of Onset   Diabetes Mother    Diabetes Maternal Grandmother    Colon cancer Maternal Uncle 65   ROS: All systems reviewed and negative except as per HPI.   LMP 10/08/2013   Wt Readings from Last 3 Encounters:  12/07/23 81.3 kg (179 lb 3.2 oz)  11/27/23 80.1 kg (176 lb 9.6 oz)  08/28/23 80.4 kg (177 lb 3.2 oz)   PHYSICAL EXAM: General:  *** appearing.   No respiratory difficulty Neck: JVD *** cm.  Cor: Regular rate & rhythm. No murmurs. Lungs: clear Extremities: no edema  Neuro: alert & oriented x 3. Affect pleasant.   ASSESSMENT & PLAN: 1. Chronic systolic CHF:  Echo in 7/24 with EF 20-25% with moderate LV dilation but normal wall thickness, normal RV, IVC not dilated.  Cause is uncertain, possibly due to long-standing HTN but walls are not thick. Poorly controlled DM could play a role.  She was said to have HF when her daughter was born but do not have echo available from then and echoes  in 2019 and 2021 showed normal EF.  No ETOH or drugs.  She did have a brother who had a cardiac arrest, but he had multiple medical problems. There is not clearly a familial predisposition. No cath with CKD stage IV.  Echo 12/24 with EF 30-35%, global hypokinesis, GIIDD, RV normal, small pericardial effusion, LV apical thrombus, mild MR.   - NYHA class II, not volume overloaded on exam.  GDMT limited by renal dysfunction.  - Continue Lasix  40 mg bid. Recent BMET today.   - Continue Coreg  12.5 mg bid.  - Continue Bidil  2 tabs tid.  No longer having headaches.  - GFR too low to start SGLT2 inhibitor, ARNI, spironolactone.  - We have discussed cardiac MRI but her nephrologist does not want her to get gadolinium.  - Repeat echo in 12/25. *** 2. LV apical thrombus: Continue Eliquis .  3. HTN: BP is mildly elevated.  Hard to know how much HTN contributed to cardiomyopathy as she has been a long-time hypertensive and prior echoes have been normal.  Also, LV walls are not particularly thick.  - Increasing Bidil  as above.  4. Type 2 diabetes: She is on semaglutide .  5. CKD IV: Seeing nephrology.   Follow up with HF pharmacist in 3 wks to see if we can increase Coreg .  Follow up in 3 months with APP  I spent 32 minutes reviewing records, interviewing/examining patient, and managing order    Beckey LITTIE Coe  04/28/24

## 2024-04-29 ENCOUNTER — Telehealth (HOSPITAL_COMMUNITY): Payer: Self-pay

## 2024-04-29 NOTE — Telephone Encounter (Signed)
 Called to confirm/remind patient of their appointment at the [facility] on 04/29/24.   Appointment:   [x] Confirmed  [] Left mess   [] No answer/No voice mail  [] VM Full/unable to leave message  [] Phone not in service  Patient reminded to bring all medications and/or complete list.  Confirmed patient has transportation. Gave directions, instructed to utilize valet parking.

## 2024-04-30 ENCOUNTER — Ambulatory Visit (HOSPITAL_COMMUNITY)

## 2024-05-06 ENCOUNTER — Ambulatory Visit: Admitting: Internal Medicine

## 2024-05-08 ENCOUNTER — Other Ambulatory Visit: Payer: Self-pay

## 2024-05-09 ENCOUNTER — Other Ambulatory Visit (HOSPITAL_COMMUNITY): Payer: Self-pay

## 2024-05-12 ENCOUNTER — Telehealth (HOSPITAL_COMMUNITY): Payer: Self-pay

## 2024-05-12 ENCOUNTER — Other Ambulatory Visit (HOSPITAL_COMMUNITY): Payer: Self-pay

## 2024-05-12 ENCOUNTER — Other Ambulatory Visit: Payer: Self-pay

## 2024-05-12 NOTE — Telephone Encounter (Signed)
 Patient went to pick up her medication. She stated pharmacy said she has no insurance and that its not covered. She is looking for a generic of Bidil . Will reach out to our pharmacy team to see if they can help

## 2024-05-13 ENCOUNTER — Other Ambulatory Visit: Payer: Self-pay

## 2024-05-19 ENCOUNTER — Other Ambulatory Visit (HOSPITAL_COMMUNITY): Payer: Self-pay

## 2024-05-19 NOTE — Telephone Encounter (Signed)
 Spoke to patient by phone, pt confirmed she is paying out of pocket and is okay for now. Nothing further needed at this time

## 2024-05-20 ENCOUNTER — Other Ambulatory Visit (HOSPITAL_COMMUNITY): Payer: Self-pay | Admitting: Family Medicine

## 2024-05-20 ENCOUNTER — Other Ambulatory Visit: Payer: Self-pay | Admitting: Internal Medicine

## 2024-05-20 DIAGNOSIS — E1122 Type 2 diabetes mellitus with diabetic chronic kidney disease: Secondary | ICD-10-CM

## 2024-05-20 NOTE — Telephone Encounter (Unsigned)
 Copied from CRM #[id number]. Topic: Clinical - Medication Refill >> May 20, 2024  4:25 PM Zebedee SAUNDERS wrote: Medication: carvedilol  (COREG ) 12.5 MG tablet  Has the patient contacted their pharmacy? Yes (Agent: If no, request that the patient contact the pharmacy for the refill. If patient does not wish to contact the pharmacy document the reason why and proceed with request.) (Agent: If yes, when and what did the pharmacy advise?)  This is the patient's preferred pharmacy:  [id number] - [place], [place] - [address] [place] [address] Phone: [phone] Fax: [phone]  Is this the correct pharmacy for this prescription? Yes If no, delete pharmacy and type the correct one.   Has the prescription been filled recently? Yes  Is the patient out of the medication? Yes  Has the patient been seen for an appointment in the last year OR does the patient have an upcoming appointment? Yes  Can we respond through MyChart? Yes  Agent: Please be advised that Rx refills may take up to 3 business days. We ask that you follow-up with your pharmacy.

## 2024-05-21 ENCOUNTER — Other Ambulatory Visit (HOSPITAL_COMMUNITY): Payer: Self-pay

## 2024-05-21 DIAGNOSIS — E1122 Type 2 diabetes mellitus with diabetic chronic kidney disease: Secondary | ICD-10-CM

## 2024-05-21 MED ORDER — CARVEDILOL 12.5 MG PO TABS: 12.5000 mg | ORAL_TABLET | Freq: Two times a day (BID) | ORAL | 1 refills | Status: AC

## 2024-05-23 ENCOUNTER — Telehealth (HOSPITAL_COMMUNITY): Payer: Self-pay | Admitting: Vascular Surgery

## 2024-05-23 NOTE — Telephone Encounter (Signed)
 Spoke with patient and she states that her insurance was terminated. Patient reports that now she cannot afford her medications.   Patient states that she's going to go an reapply and see what she can do about her insurance. Patient reports she does need help with medications as she's paying for 10 days at a time.   Patient reports she does have meds right now, confirmed she does have eliquis  for the weekend, advised patient that I left samples of Eliquis  at the front desk for pick up. Patient aware and verbalized understanding. Advised her that I would also send LCSW a message regarding this as well for more resources and input.   Medication Samples have been provided to the patient.  Drug name: Eliquis         Strength: 5mg          Qty: 4 Boxes   LOT: [id number]  Exp.Date: 07/2024  Dosing instructions: take 1 tablet by mouth twice daily   The patient has been instructed regarding the correct time, dose, and frequency of taking this medication, including desired effects and most common side effects.   Donaldo Teegarden B Tallan Sandoz 4:35 PM 05/23/2024

## 2024-05-23 NOTE — Telephone Encounter (Signed)
 Pt insurance has been terminated, she needs some help with Eliquis  please advise

## 2024-05-26 ENCOUNTER — Telehealth (HOSPITAL_COMMUNITY): Payer: Self-pay | Admitting: Licensed Clinical Social Worker

## 2024-05-26 NOTE — Telephone Encounter (Signed)
 H&V Care Navigation CSW Progress Note  Clinical Social Worker received consult that pt has lost Medicaid.  CSW called pt who confirms she lost Medicaid on Nov 30th states she was asked to turn in pay stubs and was then cut off at that time reportedly for being over income.  States her income has not changed so she is unsure why it was stopped.  Has appt with Medicaid worker tomorrow to discuss- CSW will follow up with her on Wednesday to see if she can get Medicaid again.  Pt struggling to pay for medications in the meantime- samples had been left of Eliquis  at front she plans to pick up today.  CSW discuss HF fund since she has no insurance- requested RN send all scripts to [facility] under [employer] and let patient know which medications we could assist with.  Directed her to talk to PCP regarding any medications they prescribed to see what assistance might be available.  Will continue to follow and assist as needed  Linda HILARIO Leech, LCSW Clinical Social Worker [facility] Desk#: [phone] Cell#: [phone]

## 2024-05-27 ENCOUNTER — Telehealth: Payer: Self-pay | Admitting: Internal Medicine

## 2024-05-27 ENCOUNTER — Other Ambulatory Visit: Payer: Self-pay

## 2024-05-27 NOTE — Telephone Encounter (Signed)
Attempted to call patient, left message for patient to call back to office.   

## 2024-05-27 NOTE — Telephone Encounter (Signed)
 Copied from CRM [id number]. Topic: General - Other >> May 27, 2024  4:20 PM Zebedee SAUNDERS wrote:  Reason for CRM: Pt wants to know if there is a generic brand for Ozempic  that pt might afford. Please call pt at Mobile [phone]

## 2024-05-28 ENCOUNTER — Other Ambulatory Visit: Payer: Self-pay

## 2024-05-28 ENCOUNTER — Other Ambulatory Visit: Payer: Self-pay | Admitting: Pharmacist

## 2024-05-28 ENCOUNTER — Telehealth: Payer: Self-pay | Admitting: Internal Medicine

## 2024-05-28 ENCOUNTER — Telehealth (HOSPITAL_COMMUNITY): Payer: Self-pay | Admitting: Licensed Clinical Social Worker

## 2024-05-28 DIAGNOSIS — Z7985 Long-term (current) use of injectable non-insulin antidiabetic drugs: Secondary | ICD-10-CM

## 2024-05-28 DIAGNOSIS — E1165 Type 2 diabetes mellitus with hyperglycemia: Secondary | ICD-10-CM

## 2024-05-28 MED ORDER — OZEMPIC (0.25 OR 0.5 MG/DOSE) 2 MG/3ML ~~LOC~~ SOPN
0.5000 mg | PEN_INJECTOR | SUBCUTANEOUS | 2 refills | Status: AC
  Filled 2024-05-28 – 2024-06-06 (×3): qty 3, 28d supply, fill #0

## 2024-05-28 NOTE — Telephone Encounter (Signed)
 H&V Care Navigation CSW Progress Note  Clinical Social Worker called pt to check in after Medicaid appt yesterday.  States she reapplied for Medicaid but that she believes she is over income limit.  Patient also states that she received BCBS card in the mail and is working on confirming it is active.  Will reach out if it turns out she does not have active coverage.  Andriette HILARIO Leech, LCSW Clinical Social Worker [facility] Desk#: [phone] Cell#: [phone]

## 2024-05-28 NOTE — Telephone Encounter (Signed)
No generic

## 2024-05-28 NOTE — Telephone Encounter (Signed)
 Copied from CRM [id number]. Topic: Clinical - Medication Prior Auth >> May 28, 2024 12:50 PM Lauren C wrote:  Reason for CRM: Pt has new sara lee (added to chart) and needs a prior auth started for her ozempic . She says this is urgent because she is out of the medication for this week. Please call once complete #[phone]

## 2024-05-28 NOTE — Telephone Encounter (Signed)
 Called & spoke to the patient. Verified name & DOB. Informed that there is no generic alternative to Ozempic . Patient expressed verbal understanding but is concerned about next steps because she currently is uninsured and is working on veterinary surgeon her insurance. She stated that her last Ozempic  dose was last Sunday and does not have any doses left. Patient would like to know if there is any samples she is able to have.  Patient no-showed appointment in Dec and have rescheduled her appointment for 06/20/24. Informed patient that she can apply for financial assistance if she is not approved for insurance. Please advise next steps for diabetes management.

## 2024-05-29 ENCOUNTER — Other Ambulatory Visit: Payer: Self-pay

## 2024-05-30 ENCOUNTER — Other Ambulatory Visit: Payer: Self-pay

## 2024-05-30 ENCOUNTER — Telehealth: Payer: Self-pay

## 2024-05-30 NOTE — Telephone Encounter (Signed)
 Pharmacy Patient Advocate Encounter  Received notification from [facility] that Prior Authorization for OZEMPIC  has been APPROVED from 05/28/2024 to 05/28/2025   PA #/Case ID/Reference #: [id number]

## 2024-05-30 NOTE — Telephone Encounter (Signed)
 Called & spoke to the patient. Verified name & DOB. Informed that a PA was submitted to [facility] on 05/29/2023 and still awaiting on a decision from insurance. Informed that kelly will reach out and speak to her when a decision has been determined. Patient expressed verbal understanding of all discussed.

## 2024-05-31 NOTE — Telephone Encounter (Signed)
 Let pt know that we will discuss other options for her on her upcoming visit. Continue Glipizide  in the mean time. Keep record of BS readings and bring with her on her upcoming appt.

## 2024-06-02 NOTE — Telephone Encounter (Signed)
 Called but no answer. LVM to call back.

## 2024-06-03 ENCOUNTER — Other Ambulatory Visit: Payer: Self-pay | Admitting: Internal Medicine

## 2024-06-03 DIAGNOSIS — E1165 Type 2 diabetes mellitus with hyperglycemia: Secondary | ICD-10-CM

## 2024-06-03 DIAGNOSIS — Z7984 Long term (current) use of oral hypoglycemic drugs: Secondary | ICD-10-CM

## 2024-06-03 NOTE — Telephone Encounter (Signed)
 Called & spoke to the patient. Verified name & DOB. Informed that Dr.Johnson will discuss other options for her on her upcoming visit. Continue Glipizide  in the mean time. Keep record of BS readings and bring with her on her upcoming appt. Patient expressed verbal understanding of all discussed.

## 2024-06-03 NOTE — Telephone Encounter (Unsigned)
 Copied from CRM #[id number]. Topic: Clinical - Medication Refill >> Jun 03, 2024  2:46 PM Antony RAMAN wrote: Medication: glipiZIDE  (GLUCOTROL  XL) 5 MG 24 hr tablet (Needs enough to last until her appointment on 1/30)  Has the patient contacted their pharmacy? Yes (Agent: If no, request that the patient contact the pharmacy for the refill. If patient does not wish to contact the pharmacy document the reason why and proceed with request.) (Agent: If yes, when and what did the pharmacy advise?)  This is the patient's preferred pharmacy:  [id number] - [place], [place] - [address] [place] [address] Phone: [phone] Fax: [phone]   Is this the correct pharmacy for this prescription? Yes If no, delete pharmacy and type the correct one.   Has the prescription been filled recently? No  Is the patient out of the medication? No  Has the patient been seen for an appointment in the last year OR does the patient have an upcoming appointment? Yes  Can we respond through MyChart? no  Agent: Please be advised that Rx refills may take up to 3 business days. We ask that you follow-up with your pharmacy.

## 2024-06-04 ENCOUNTER — Other Ambulatory Visit (HOSPITAL_COMMUNITY): Payer: Self-pay | Admitting: Cardiology

## 2024-06-04 ENCOUNTER — Other Ambulatory Visit: Payer: Self-pay

## 2024-06-04 ENCOUNTER — Other Ambulatory Visit (HOSPITAL_COMMUNITY): Payer: Self-pay

## 2024-06-04 MED ORDER — GLIPIZIDE ER 5 MG PO TB24: 5.0000 mg | ORAL_TABLET | Freq: Every day | ORAL | 0 refills | Status: AC

## 2024-06-04 MED ORDER — APIXABAN 5 MG PO TABS
5.0000 mg | ORAL_TABLET | Freq: Two times a day (BID) | ORAL | 5 refills | Status: AC
  Filled 2024-06-04: qty 60, 30d supply, fill #0

## 2024-06-04 NOTE — Telephone Encounter (Signed)
 Requested Prescriptions  Pending Prescriptions Disp Refills   glipiZIDE  (GLUCOTROL  XL) 5 MG 24 hr tablet 30 tablet 0    Sig: Take 1 tablet (5 mg total) by mouth daily with breakfast.     Endocrinology:  Diabetes - Sulfonylureas Failed - 06/04/2024  2:48 PM      Failed - HBA1C is between 0 and 7.9 and within 180 days    HbA1c, POC (controlled diabetic range)  Date Value Ref Range Status  05/09/2023 5.4 0.0 - 7.0 % Final   Hgb A1c MFr Bld  Date Value Ref Range Status  11/27/2023 5.2 4.8 - 5.6 % Final    Comment:             Prediabetes: 5.7 - 6.4          Diabetes: >6.4          Glycemic control for adults with diabetes: <7.0          Failed - Cr in normal range and within 360 days    Creat  Date Value Ref Range Status  07/11/2016 1.21 (H) 0.50 - 1.10 mg/dL Final   Creatinine, Ser  Date Value Ref Range Status  11/27/2023 5.06 (H) 0.57 - 1.00 mg/dL Final   Creatinine, POC  Date Value Ref Range Status  01/08/2017 100 mg/dL Final   Creatinine, Urine  Date Value Ref Range Status  04/13/2015 34 20 - 320 mg/dL Final         Failed - Valid encounter within last 6 months    Recent Outpatient Visits           6 months ago Hypertension associated with 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 [facility] [place], [place] W, NP   1 year ago Type 2 diabetes mellitus with hyperglycemia, without long-term current use of insulin  [facility])   [facility] Dept Of [place]. [facility] [place], [place], [place]   1 year ago Type 2 diabetes mellitus with hyperglycemia, without long-term current use of insulin  [facility])   [facility] - A Dept Of [place]. [facility] Delbert Clam, MD   2 years ago Essential hypertension   [facility] - A Dept Of [place]. [facility] Theotis Haze ORN, NP   3 years ago Primary  hypertension   [facility] - A Dept Of [place]. [facility] Theotis Haze ORN, [place]

## 2024-06-05 ENCOUNTER — Telehealth (HOSPITAL_COMMUNITY): Payer: Self-pay | Admitting: Licensed Clinical Social Worker

## 2024-06-05 ENCOUNTER — Other Ambulatory Visit: Payer: Self-pay

## 2024-06-05 NOTE — Telephone Encounter (Signed)
 H&V Care Navigation CSW Progress Note  Clinical Social Worker received call from pt yesterday with concerns about med cost on new plan.  Her new [employer] plan has $3,500 deductible so pharmacy is asking for $1000 foozempic  refill which patient cannot afford.  Patient has discussed with with PCP office and has been instructed to take alternative medication until follow up visit when they can discuss further as there is no generic option of ozempic .   CSW assisted in getting Eliquis  copay card and patient advocate put on file for her to help make this medication more affordable for her.  Will continue to follow through clinic and assist as needed  Linda Phelps H. [name], LCSW Clinical Social Worker [facility] Desk#: [phone] Cell#: [phone]

## 2024-06-06 ENCOUNTER — Other Ambulatory Visit: Payer: Self-pay

## 2024-06-09 ENCOUNTER — Other Ambulatory Visit: Payer: Self-pay

## 2024-06-09 ENCOUNTER — Other Ambulatory Visit (HOSPITAL_COMMUNITY): Payer: Self-pay | Admitting: Cardiology

## 2024-06-09 MED ORDER — ISOSORB DINITRATE-HYDRALAZINE 20-37.5 MG PO TABS
2.0000 | ORAL_TABLET | Freq: Three times a day (TID) | ORAL | 6 refills | Status: AC
  Filled 2024-06-09 (×2): qty 135, 23d supply, fill #0

## 2024-06-09 NOTE — Telephone Encounter (Signed)
 Triage message requesting RX for brand name bidil  [facility] pharm RX sent

## 2024-06-10 ENCOUNTER — Other Ambulatory Visit: Payer: Self-pay

## 2024-06-11 ENCOUNTER — Other Ambulatory Visit: Payer: Self-pay

## 2024-06-13 ENCOUNTER — Other Ambulatory Visit: Payer: Self-pay

## 2024-06-20 ENCOUNTER — Ambulatory Visit: Payer: Self-pay | Admitting: Internal Medicine

## 2024-06-27 ENCOUNTER — Other Ambulatory Visit: Payer: Self-pay

## 2024-07-02 ENCOUNTER — Ambulatory Visit (HOSPITAL_COMMUNITY): Admitting: Cardiology

## 2024-07-25 ENCOUNTER — Ambulatory Visit: Payer: Self-pay | Admitting: Internal Medicine
